# Patient Record
Sex: Female | Born: 1960 | Race: White | Hispanic: No | Smoking: Never smoker
Health system: Southern US, Community
[De-identification: ages and names within clinical notes are randomized; demographics above are authoritative.]

## PROBLEM LIST (undated history)

## (undated) DIAGNOSIS — K851 Biliary acute pancreatitis without necrosis or infection: Secondary | ICD-10-CM

## (undated) DIAGNOSIS — E039 Hypothyroidism, unspecified: Secondary | ICD-10-CM

## (undated) DIAGNOSIS — N2889 Other specified disorders of kidney and ureter: Secondary | ICD-10-CM

## (undated) DIAGNOSIS — C801 Malignant (primary) neoplasm, unspecified: Secondary | ICD-10-CM

## (undated) DIAGNOSIS — K589 Irritable bowel syndrome without diarrhea: Secondary | ICD-10-CM

## (undated) DIAGNOSIS — E785 Hyperlipidemia, unspecified: Secondary | ICD-10-CM

## (undated) DIAGNOSIS — M1990 Unspecified osteoarthritis, unspecified site: Secondary | ICD-10-CM

## (undated) HISTORY — PX: TUBAL LIGATION: SHX77

## (undated) HISTORY — DX: Hyperlipidemia, unspecified: E78.5

## (undated) HISTORY — DX: Irritable bowel syndrome, unspecified: K58.9

---

## 1997-11-11 ENCOUNTER — Ambulatory Visit (HOSPITAL_COMMUNITY): Admission: RE | Admit: 1997-11-11 | Discharge: 1997-11-11 | Payer: Self-pay | Admitting: Obstetrics and Gynecology

## 1998-01-12 ENCOUNTER — Inpatient Hospital Stay (HOSPITAL_COMMUNITY): Admission: AD | Admit: 1998-01-12 | Discharge: 1998-01-15 | Payer: Self-pay | Admitting: Obstetrics and Gynecology

## 1998-02-22 ENCOUNTER — Other Ambulatory Visit: Admission: RE | Admit: 1998-02-22 | Discharge: 1998-02-22 | Payer: Self-pay | Admitting: Obstetrics and Gynecology

## 2003-06-22 ENCOUNTER — Other Ambulatory Visit: Admission: RE | Admit: 2003-06-22 | Discharge: 2003-06-22 | Payer: Self-pay | Admitting: Obstetrics and Gynecology

## 2003-10-05 ENCOUNTER — Ambulatory Visit (HOSPITAL_COMMUNITY): Admission: RE | Admit: 2003-10-05 | Discharge: 2003-10-05 | Payer: Self-pay | Admitting: Obstetrics and Gynecology

## 2004-10-06 ENCOUNTER — Ambulatory Visit (HOSPITAL_COMMUNITY): Admission: RE | Admit: 2004-10-06 | Discharge: 2004-10-06 | Payer: Self-pay | Admitting: Internal Medicine

## 2005-01-05 ENCOUNTER — Other Ambulatory Visit: Admission: RE | Admit: 2005-01-05 | Discharge: 2005-01-05 | Payer: Self-pay | Admitting: Obstetrics and Gynecology

## 2005-08-06 ENCOUNTER — Emergency Department (HOSPITAL_COMMUNITY): Admission: EM | Admit: 2005-08-06 | Discharge: 2005-08-06 | Payer: Self-pay | Admitting: Emergency Medicine

## 2005-11-01 ENCOUNTER — Ambulatory Visit (HOSPITAL_COMMUNITY): Admission: RE | Admit: 2005-11-01 | Discharge: 2005-11-01 | Payer: Self-pay | Admitting: Obstetrics and Gynecology

## 2006-11-05 ENCOUNTER — Ambulatory Visit (HOSPITAL_COMMUNITY): Admission: RE | Admit: 2006-11-05 | Discharge: 2006-11-05 | Payer: Self-pay | Admitting: Internal Medicine

## 2007-11-06 ENCOUNTER — Ambulatory Visit (HOSPITAL_COMMUNITY): Admission: RE | Admit: 2007-11-06 | Discharge: 2007-11-06 | Payer: Self-pay | Admitting: Internal Medicine

## 2008-11-06 ENCOUNTER — Ambulatory Visit (HOSPITAL_COMMUNITY): Admission: RE | Admit: 2008-11-06 | Discharge: 2008-11-06 | Payer: Self-pay | Admitting: Internal Medicine

## 2008-11-06 IMAGING — MG MM DIGITAL SCREENING
4 series · 4 of 4 positions shown · non-contrast
Comparison: none

DG SCREEN MAMMOGRAM BILATERAL
Bilateral CC and MLO view(s) were taken.

DIGITAL SCREENING MAMMOGRAM WITH CAD:
There are scattered fibroglandular densities.  No masses or malignant type calcifications are 
identified.  Compared with prior studies.

[L CC]
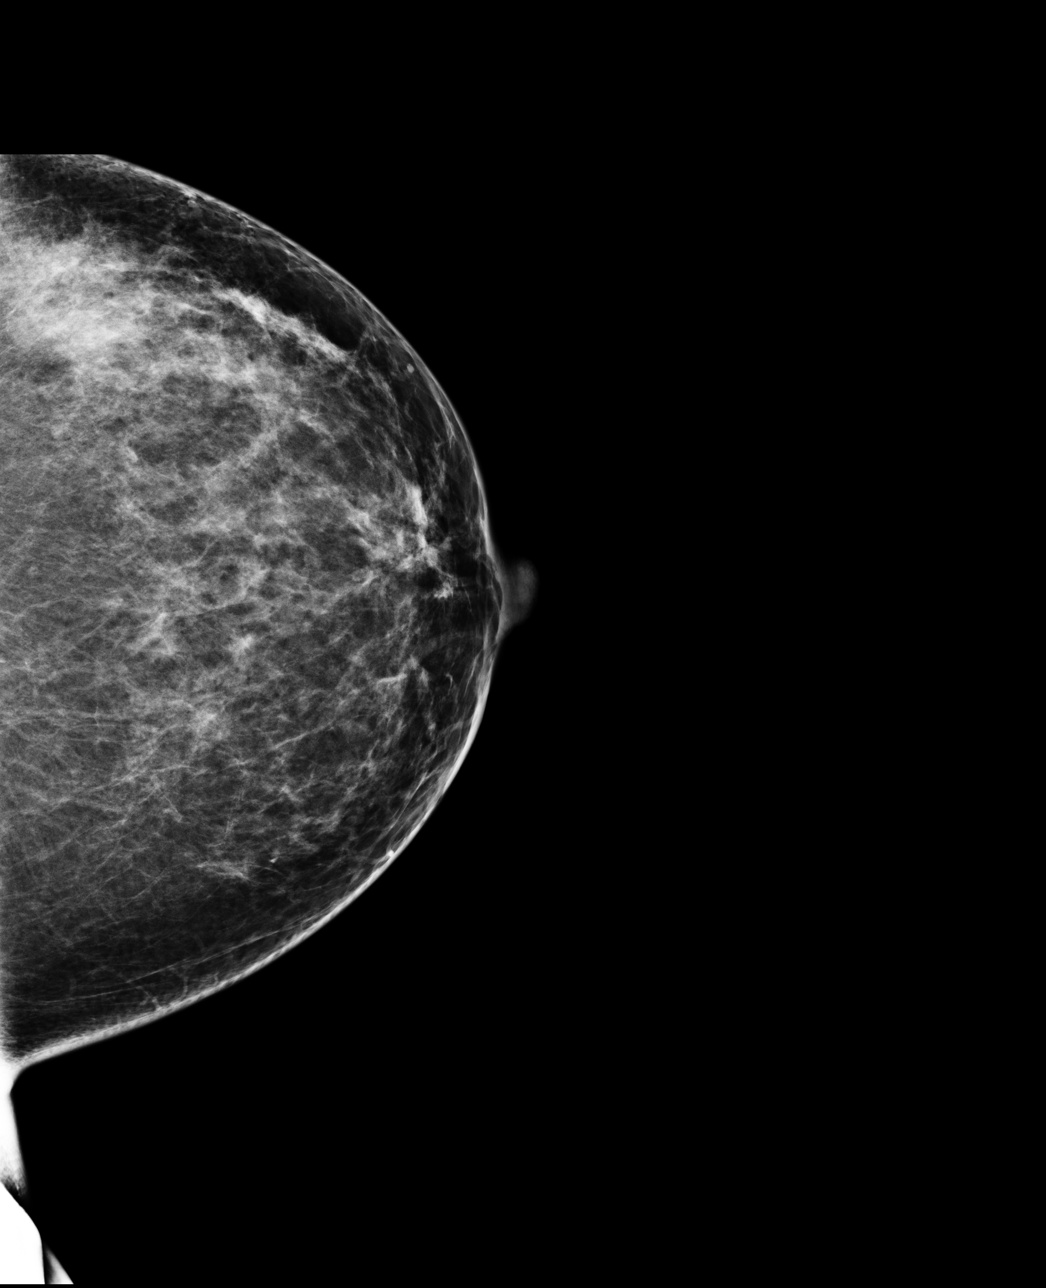

[L MLO]
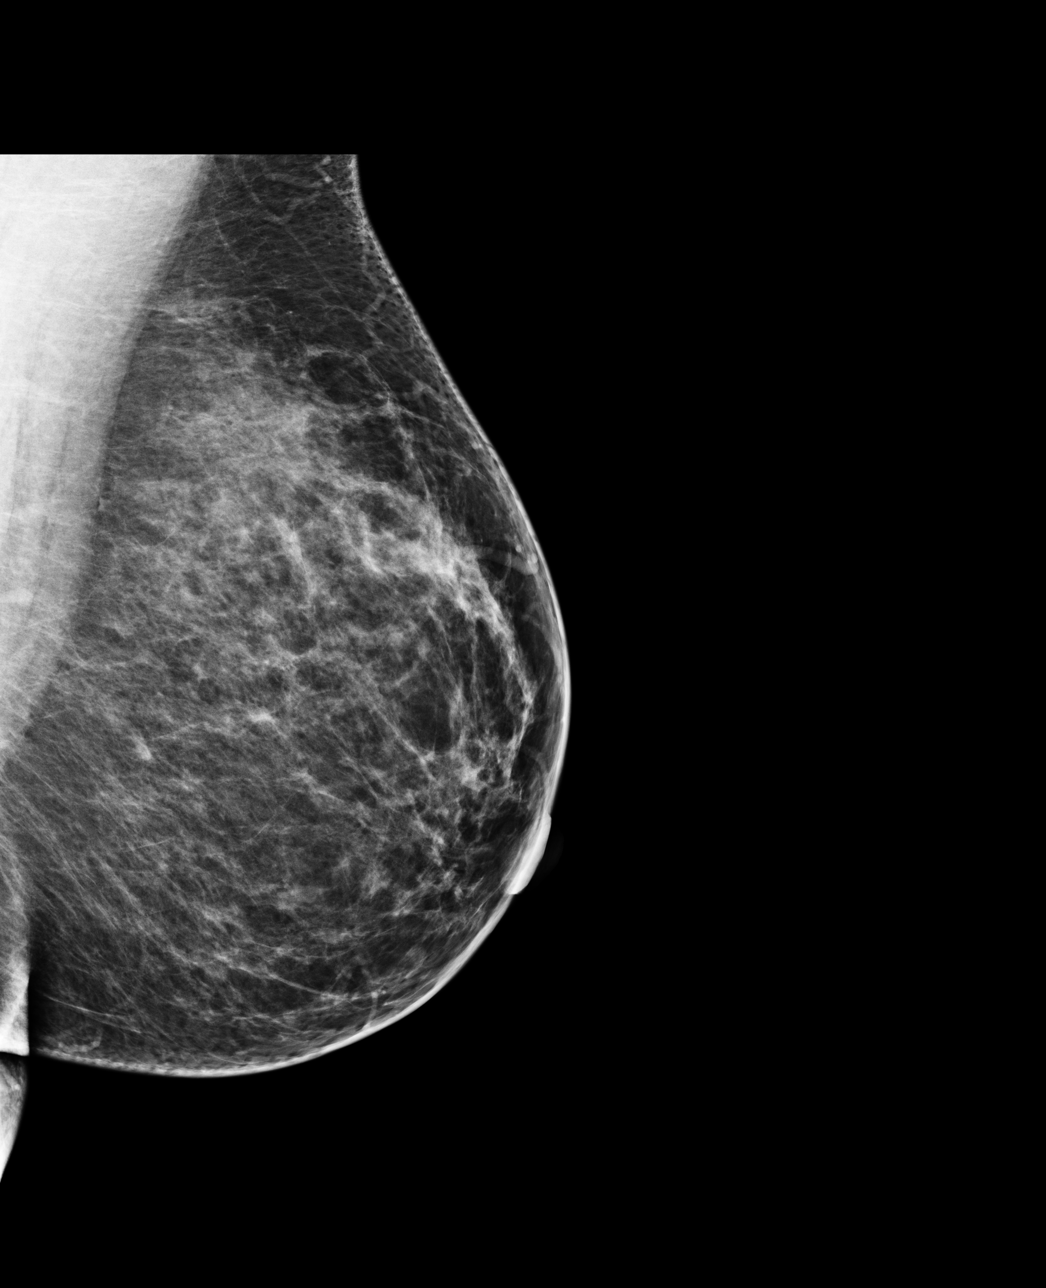

[R CC]
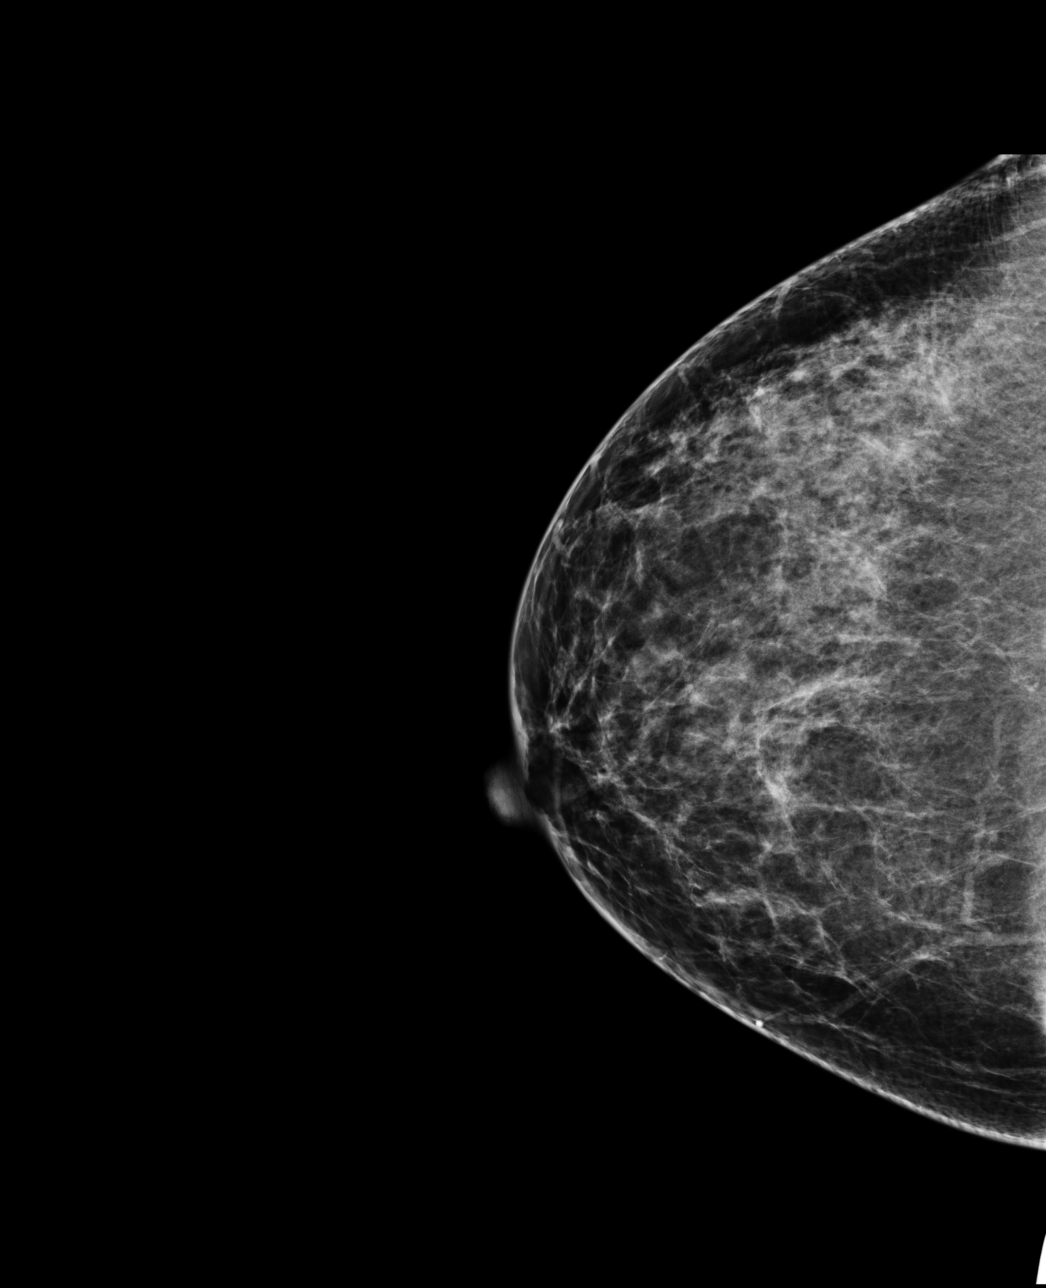

[R MLO]
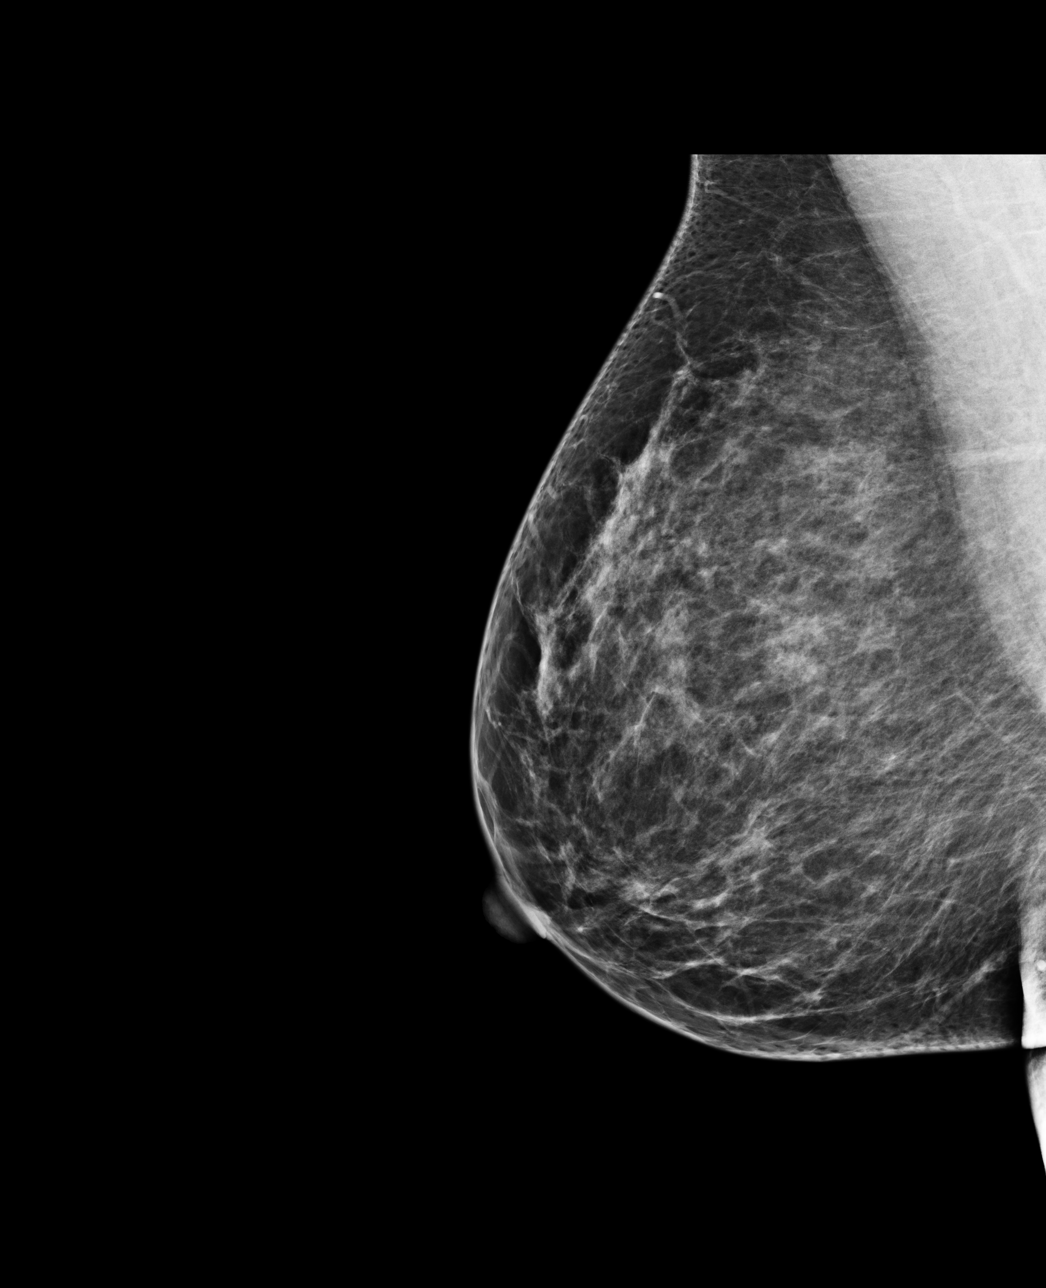

[4 of 4 positions shown; findings below may reference images not displayed]

IMPRESSION: No specific mammographic evidence of malignancy.  Next screening mammogram is recommended in one 
year.

A result letter of this screening mammogram will be mailed directly to the patient.

ASSESSMENT: Negative - BI-RADS 1

Screening mammogram in 1 year.
THIS WAS ANALAYZED BY COMPUTER AIDED DETECTION. , THIS PROCEDURE WAS A DIGITAL MAMMOGRAM.

## 2009-11-09 ENCOUNTER — Ambulatory Visit (HOSPITAL_COMMUNITY): Admission: RE | Admit: 2009-11-09 | Discharge: 2009-11-09 | Payer: Self-pay | Admitting: Internal Medicine

## 2010-10-03 ENCOUNTER — Other Ambulatory Visit (HOSPITAL_COMMUNITY): Payer: Self-pay | Admitting: Internal Medicine

## 2010-10-03 DIAGNOSIS — Z139 Encounter for screening, unspecified: Secondary | ICD-10-CM

## 2010-11-14 ENCOUNTER — Ambulatory Visit (HOSPITAL_COMMUNITY)
Admission: RE | Admit: 2010-11-14 | Discharge: 2010-11-14 | Disposition: A | Discharge status: Home / Self Care | Payer: BC Managed Care – PPO | Source: Ambulatory Visit | Attending: Internal Medicine | Admitting: Internal Medicine

## 2010-11-14 DIAGNOSIS — Z1231 Encounter for screening mammogram for malignant neoplasm of breast: Secondary | ICD-10-CM | POA: Insufficient documentation

## 2010-11-14 DIAGNOSIS — Z139 Encounter for screening, unspecified: Secondary | ICD-10-CM

## 2011-07-25 ENCOUNTER — Ambulatory Visit: Payer: BC Managed Care – PPO | Admitting: Urgent Care

## 2011-07-31 ENCOUNTER — Ambulatory Visit: Payer: BC Managed Care – PPO | Admitting: Gastroenterology

## 2011-08-01 ENCOUNTER — Ambulatory Visit (INDEPENDENT_AMBULATORY_CARE_PROVIDER_SITE_OTHER): Payer: BC Managed Care – PPO | Admitting: Gastroenterology

## 2011-08-01 ENCOUNTER — Encounter: Payer: Self-pay | Admitting: Gastroenterology

## 2011-08-01 ENCOUNTER — Telehealth: Payer: Self-pay | Admitting: Gastroenterology

## 2011-08-01 VITALS — BP 115/78 | HR 75 | Temp 98.1°F | Ht 67.0 in | Wt 226.0 lb

## 2011-08-01 DIAGNOSIS — R195 Other fecal abnormalities: Secondary | ICD-10-CM | POA: Insufficient documentation

## 2011-08-01 DIAGNOSIS — K649 Unspecified hemorrhoids: Secondary | ICD-10-CM | POA: Insufficient documentation

## 2011-08-01 DIAGNOSIS — K625 Hemorrhage of anus and rectum: Secondary | ICD-10-CM

## 2011-08-01 LAB — POC HEMOCCULT BLD/STL (OFFICE/1-CARD/DIAGNOSTIC): Fecal Occult Blood, POC: NEGATIVE

## 2011-08-01 MED ORDER — LIDOCAINE-HYDROCORTISONE ACE 3-0.5 % RE CREA: 1.0000 | TOPICAL_CREAM | Freq: Two times a day (BID) | RECTAL | Status: DC

## 2011-08-01 MED ORDER — SOD PICOSULFATE-MAG OX-CIT ACD 10-3.5-12 MG-GM-GM PO PACK: 1.0000 | PACK | Freq: Once | ORAL | Status: DC

## 2011-08-01 NOTE — Progress Notes (Signed)
Primary Care Physician:  [name], MD  Primary Gastroenterologist:  Roetta Sessions, MD   Chief Complaint  Patient presents with  . Rectal Bleeding    not now    HPI:  Dana Hanson is a 51 y.o. female here for further evaluation of rectal bleeding and hemorrhoids.   Three weeks ago hemorrhoid flared-up. Developed bad rectal pain, difficult to sit. Tried various OTC hemorrhoidal agents including prep-H, Tucks. After about one week, felt like something burst and developed profuse rectal bleeding. Had to wear pads for four days, completely saturated them every few hours. Now no further bleeding. ?new hemorrhoids mildly tender.   Chronically has two large hemorrhoid tags that hang out that she wants taken care of.  Denies constipation, hard stools or strain. Denies heavy lifting. Still with little pain in rectum after BM. Sometimes heartburn with eating late, or fried/spicey foods. Takes otc antacid. No dysphagia. No prior colonoscopy.   Current Outpatient Prescriptions  Medication Sig Dispense Refill  . fenofibrate 160 MG tablet Take 160 mg by mouth daily.       Marland Kitchen lidocaine-hydrocortisone (ANAMANTEL HC) 3-0.5 % CREA Place 1 Applicatorful rectally 2 (two) times daily.  28 g  0  . Sod Picosulfate-Mag Ox-Cit Acd (PREPOPIK) 10-3.5-12 MG-GM-GM PACK Take 1 kit by mouth once.  1 each  0    Allergies as of 08/01/2011  . (No Known Allergies)    Past Medical History  Diagnosis Date  . Hyperlipidemia     Past Surgical History  Procedure Date  . Cesarean section     x 2    Family History  Problem Relation Age of Onset  . Colon cancer Neg Hx   . Liver disease Neg Hx     History   Social History  . Marital Status: Married    Spouse Name: N/A    Number of Children: 2  . Years of Education: N/A   Occupational History  .  [employer]   Social History Main Topics  . Smoking status: Never Smoker   . Smokeless tobacco: Not on file  . Alcohol Use: No  . Drug Use: No  . Sexually  Active: Not on file   Other Topics Concern  . Not on file   Social History Narrative  . No narrative on file      ROS:  General: Negative for anorexia, weight loss, fever, chills, fatigue, weakness. Eyes: Negative for vision changes.  ENT: Negative for hoarseness, difficulty swallowing , nasal congestion. CV: Negative for chest pain, angina, palpitations, dyspnea on exertion, peripheral edema.  Respiratory: Negative for dyspnea at rest, dyspnea on exertion, cough, sputum, wheezing.  GI: See history of present illness. GU:  Negative for dysuria, hematuria, urinary incontinence, urinary frequency, nocturnal urination.  MS: Negative for joint pain, low back pain.  Derm: Negative for rash or itching.  Neuro: Negative for weakness, abnormal sensation, seizure, frequent headaches, memory loss, confusion.  Psych: Negative for anxiety, depression, suicidal ideation, hallucinations.  Endo: Negative for unusual weight change.  Heme: Negative for bruising or bleeding. Allergy: Negative for rash or hives.    Physical Examination:  BP 115/78  Pulse 75  Temp(Src) 98.1 F (36.7 C) (Temporal)  Ht 5\' 7"  (1.702 m)  Wt 226 lb (102.513 kg)  BMI 35.40 kg/m2  LMP 07/24/2011   General: Well-nourished, well-developed in no acute distress.  Head: Normocephalic, atraumatic.   Eyes: Conjunctiva pink, no icterus. Mouth: Oropharyngeal mucosa moist and pink , no lesions erythema or exudate. Neck:  Supple without thyromegaly, masses, or lymphadenopathy.  Lungs: Clear to auscultation bilaterally.  Heart: Regular rate and rhythm, no murmurs rubs or gallops.  Abdomen: Bowel sounds are normal, nontender, nondistended, no hepatosplenomegaly or masses, no abdominal bruits or    hernia , no rebound or guarding.   Rectal: Multiple large hemorrhoidal tags. One hemorrhoid with area of excoriation. Nonthrombosed. Otherwise normal DRE. Stool was heme positive but ?contamination from hemorrhoid. Extremities: No  lower extremity edema. No clubbing or deformities.  Neuro: Alert and oriented x 4 , grossly normal neurologically.  Skin: Warm and dry, no rash or jaundice.   Psych: Alert and cooperative, normal mood and affect.

## 2011-08-01 NOTE — Progress Notes (Signed)
Faxed to PCP

## 2011-08-01 NOTE — Patient Instructions (Addendum)
We have scheduled you for a colonoscopy. Please see separate instructions.  RX for hemorrhoids sent to [facility].  Hemorrhoids Hemorrhoids are veins in the rectum that get big. These veins can get blocked. Blocked veins become puffy (swollen) and painful. HOME CARE  Eat more fiber.   Drink enough fluid to keep your pee (urine) clear or pale yellow.   Exercise often.   Avoid straining to poop (bowel movement).   Keep the butt area dry and clean.   Only take medicine as told by your doctor.  If your hemorrhoids are puffy and painful:  Take a warm bath for 20 to 30 minutes. Do this 3 to 4 times a day.   Place ice packs on the area. Use the ice packs between the baths.   Put ice in a plastic bag.   Place a towel between your skin and the bag.   Leave the ice on for 15 to 20 minutes, 3 to 4 times a day.   Do not use a donut-shaped pillow. Do not sit on the toilet for a long time.   Go to the bathroom when your body has the urge to poop. This is so you do not strain as much to poop.  GET HELP RIGHT AWAY IF:   You have increasing pain that is not controlled with medicine.   You have uncontrolled bleeding.   You cannot poop.   You have pain or puffiness outside the area of the hemorrhoids.   You have chills.   You have a temperature by mouth above 102 F (38.9 C), not controlled by medicine.  MAKE SURE YOU:   Understand these instructions.   Will watch your condition.   Will get help right away if you are not doing well or get worse.  Document Released: 04/11/2008 Document Revised: 03/15/2011 Document Reviewed: 04/11/2008 [facility] Patient Information 2012 [place], [place].

## 2011-08-01 NOTE — Assessment & Plan Note (Signed)
Recent moderate volume hematochezia likely secondary to hemorrhoid. She has significant hemorrhoidal disease and I suspect recently had thrombosed hemorrhoid based on description. Heme positive on exam today but likely contaminated by hemorrhoid with excoriation.  Recommend colonoscopy for further evaluation.  I have discussed the risks, alternatives, benefits with regards to but not limited to the risk of reaction to medication, bleeding, infection, perforation and the patient is agreeable to proceed. Written consent to be obtained.

## 2011-08-01 NOTE — Assessment & Plan Note (Signed)
[facility] pr bid. TCS as planned. She may be interested in surgery for her hemorrhoids. Await TCS and further recommendations to follow as per Dr. Jena Gauss.

## 2011-08-01 NOTE — Telephone Encounter (Signed)
Patient is calling to tell us the medication that Verlon Au called in for her today is $88 and she cant afford it and wondered if we could call in something else to [facility] in [place]

## 2011-08-02 MED ORDER — HYDROCORTISONE 2.5 % RE CREA: TOPICAL_CREAM | Freq: Two times a day (BID) | RECTAL | Status: DC

## 2011-08-02 NOTE — Telephone Encounter (Signed)
New RX sent, if still too expensive, we will keep trying.

## 2011-08-02 NOTE — Telephone Encounter (Signed)
Tried to call pt- left detailed message on pts voicemail.

## 2011-08-08 ENCOUNTER — Encounter: Payer: Self-pay | Admitting: Gastroenterology

## 2011-08-08 ENCOUNTER — Telehealth: Payer: Self-pay | Admitting: Gastroenterology

## 2011-08-08 NOTE — Telephone Encounter (Signed)
Pt called in asking for a cheaper prep- Rx for Trilyte and new instructions faxed to [facility] ( [place])

## 2011-08-11 ENCOUNTER — Encounter (HOSPITAL_COMMUNITY): Payer: Self-pay | Admitting: Pharmacy Technician

## 2011-08-11 MED ORDER — SODIUM CHLORIDE 0.45 % IV SOLN
Freq: Once | INTRAVENOUS | Status: AC
  Administered 2011-08-14: 10:00:00 via INTRAVENOUS

## 2011-08-14 ENCOUNTER — Encounter (HOSPITAL_COMMUNITY)
Admission: RE | Disposition: A | Discharge status: Home / Self Care | Payer: Self-pay | Source: Ambulatory Visit | Attending: Internal Medicine

## 2011-08-14 ENCOUNTER — Other Ambulatory Visit: Payer: Self-pay | Admitting: Internal Medicine

## 2011-08-14 ENCOUNTER — Ambulatory Visit (HOSPITAL_COMMUNITY)
Admission: RE | Admit: 2011-08-14 | Discharge: 2011-08-14 | Disposition: A | Discharge status: Home / Self Care | Payer: BC Managed Care – PPO | Source: Ambulatory Visit | Attending: Internal Medicine | Admitting: Internal Medicine

## 2011-08-14 ENCOUNTER — Encounter (HOSPITAL_COMMUNITY): Payer: Self-pay

## 2011-08-14 DIAGNOSIS — K921 Melena: Secondary | ICD-10-CM

## 2011-08-14 DIAGNOSIS — D126 Benign neoplasm of colon, unspecified: Secondary | ICD-10-CM | POA: Insufficient documentation

## 2011-08-14 DIAGNOSIS — K5730 Diverticulosis of large intestine without perforation or abscess without bleeding: Secondary | ICD-10-CM

## 2011-08-14 DIAGNOSIS — E785 Hyperlipidemia, unspecified: Secondary | ICD-10-CM | POA: Insufficient documentation

## 2011-08-14 DIAGNOSIS — K644 Residual hemorrhoidal skin tags: Secondary | ICD-10-CM

## 2011-08-14 DIAGNOSIS — K625 Hemorrhage of anus and rectum: Secondary | ICD-10-CM

## 2011-08-14 HISTORY — PX: COLONOSCOPY: SHX5424

## 2011-08-14 SURGERY — COLONOSCOPY
Anesthesia: Moderate Sedation

## 2011-08-14 MED ORDER — MEPERIDINE HCL 100 MG/ML IJ SOLN
INTRAMUSCULAR | Status: AC
  Filled 2011-08-14: qty 2

## 2011-08-14 MED ORDER — MIDAZOLAM HCL 5 MG/5ML IJ SOLN
INTRAMUSCULAR | Status: DC | PRN
  Administered 2011-08-14 (×3): 2 mg via INTRAVENOUS
  Administered 2011-08-14: 1 mg via INTRAVENOUS

## 2011-08-14 MED ORDER — MEPERIDINE HCL 100 MG/ML IJ SOLN
INTRAMUSCULAR | Status: DC | PRN
  Administered 2011-08-14 (×2): 50 mg via INTRAVENOUS
  Administered 2011-08-14: 25 mg via INTRAVENOUS

## 2011-08-14 MED ORDER — STERILE WATER FOR IRRIGATION IR SOLN
Status: DC | PRN
  Administered 2011-08-14: 10:00:00

## 2011-08-14 MED ORDER — MIDAZOLAM HCL 5 MG/5ML IJ SOLN
INTRAMUSCULAR | Status: AC
  Filled 2011-08-14: qty 10

## 2011-08-14 NOTE — Op Note (Signed)
[facility] [address] [place], [place]  [address]  COLONOSCOPY PROCEDURE REPORT  PATIENT:  Dana Hanson, Dana Hanson  MR#:  [id number] BIRTHDATE:  1960/07/18, 50 yrs. old  GENDER:  female ENDOSCOPIST:  R. Roetta Sessions, MD FACP [facility] REF. BY:  [name], M.D. PROCEDURE DATE:  08/14/2011 PROCEDURE:  Ileo- colonoscopy with snare polypectomies  INDICATIONS:  Hematochezia  INFORMED CONSENT:  The risks, benefits, alternatives and imponderables including but not limited to bleeding, perforation as well as the possibility of a missed lesion have been reviewed. The potential for biopsy, lesion removal, etc. have also been discussed.  Questions have been answered.  All parties agreeable. Please see the history and physical in the medical record for more information.  MEDICATIONS:  Versed 7 mg IV and Demerol 125 mg IV in divided doses.  DESCRIPTION OF PROCEDURE:  After a digital rectal exam was performed, the EC-[id number] ([id number]) colonoscope was advanced from the anus through the rectum and colon to the area of the cecum, ileocecal valve and appendiceal orifice.  The cecum was deeply intubated.  These structures were well-seen and photographed for the record.  From the level of the cecum and ileocecal valve, the scope was slowly and cautiously withdrawn.  The mucosal surfaces were carefully surveyed utilizing scope tip deflection to facilitate fold flattening as needed.  The scope was pulled down into the rectum where a thorough examination including retroflexion was performed. <<PROCEDUREIMAGES>>  FINDINGS: Multiple external hemorrhoid tags; At least 3;   2 were relatively large; the remainder of the rectum appeared normal. Multiple flat polyps in the  base of the cecum. They were all small-no more than 5 mm in dimensions. The patient had a single 4 mm sigmoid polyp. The patient                     also had scattered sigmoid diverticula; the remainder of the colonic  mucosa appeared normal.  THERAPEUTIC / DIAGNOSTIC MANEUVERS PERFORMED:  The polyps were either hot or cold snare removed.  COMPLICATIONS:  None  CECAL WITHDRAWAL TIME: 11 minutes  IMPRESSION:   Significant external hemorrhoidal tags -  likely source of hematochezia. Normal rectum. Colonic polyps-removed as described                   above. Colonic diverticulosis.  RECOMMENDATIONS:   Followup on pathology. The patient will likely best benefit from definitive surgical intervention in the management of her external hemorrhoidal tags in the near future.  ______________________________ R. Roetta Sessions, MD Caleen Essex  CC:  [name], M.D.  n. eSIGNED:   R. Roetta Sessions at 08/14/2011 10:53 AM  Raymondo Band, [phone]

## 2011-08-14 NOTE — H&P (Signed)
  I have seen & examined the patient prior to the procedure(s) today and reviewed the history and physical/consultation.  There have been no changes.  After consideration of the risks, benefits, alternatives and imponderables, the patient has consented to the procedure(s).   

## 2011-08-18 ENCOUNTER — Encounter: Payer: Self-pay | Admitting: Internal Medicine

## 2011-08-22 ENCOUNTER — Encounter (HOSPITAL_COMMUNITY): Payer: Self-pay | Admitting: Internal Medicine

## 2011-10-04 ENCOUNTER — Encounter (HOSPITAL_COMMUNITY): Payer: BC Managed Care – PPO

## 2011-10-13 ENCOUNTER — Other Ambulatory Visit (HOSPITAL_COMMUNITY): Payer: Self-pay | Admitting: Internal Medicine

## 2011-10-13 DIAGNOSIS — Z139 Encounter for screening, unspecified: Secondary | ICD-10-CM

## 2011-10-17 ENCOUNTER — Encounter (HOSPITAL_COMMUNITY): Payer: BC Managed Care – PPO

## 2011-10-23 ENCOUNTER — Ambulatory Visit (HOSPITAL_COMMUNITY): Admission: RE | Admit: 2011-10-23 | Payer: BC Managed Care – PPO | Source: Ambulatory Visit | Admitting: General Surgery

## 2011-10-23 ENCOUNTER — Encounter (HOSPITAL_COMMUNITY): Admission: RE | Payer: Self-pay | Source: Ambulatory Visit

## 2011-10-23 SURGERY — HEMORRHOIDECTOMY PROLAPSED
Anesthesia: General

## 2011-11-02 NOTE — Patient Instructions (Addendum)
   Your procedure is scheduled on:    Report to [facility] at 6:15     AM.  Call this number if you have problems the morning of surgery: [phone]   Remember:   Do not drink or eat food:After Midnight.    Clear liquids include soda, tea, black coffee, apple or grape juice, broth.  Take these medicines the morning of surgery with A SIP OF WATER: none   Do not wear jewelry, make-up or nail polish.  Do not wear lotions, powders, or perfumes. You may wear deodorant.  Do not shave 48 hours prior to surgery.  Do not bring valuables to the hospital.  Contacts, dentures or bridgework may not be worn into surgery.  Leave suitcase in the car. After surgery it may be brought to your room.  For patients admitted to the hospital, checkout time is 11:00 AM the day of discharge.   Patients discharged the day of surgery will not be allowed to drive home.  Name and phone number of your driver:   Special Instructions: CHG Shower Shower 2 days before surgery and 1 day before surgery with Hibiclens.   Please read over the following fact sheets that you were given: Pain Booklet, Surgical Site Infection Prevention, Anesthesia Post-op Instructions and Care and Recovery After Surgery

## 2011-11-03 ENCOUNTER — Encounter (HOSPITAL_COMMUNITY): Admission: RE | Admit: 2011-11-03 | Discharge: 2011-11-03 | Payer: BC Managed Care – PPO | Source: Ambulatory Visit

## 2011-11-03 ENCOUNTER — Encounter (HOSPITAL_COMMUNITY): Payer: Self-pay | Admitting: Pharmacy Technician

## 2011-11-06 ENCOUNTER — Encounter (HOSPITAL_COMMUNITY)
Admission: RE | Admit: 2011-11-06 | Discharge: 2011-11-06 | Disposition: A | Discharge status: Home / Self Care | Payer: BC Managed Care – PPO | Source: Ambulatory Visit | Attending: General Surgery | Admitting: General Surgery

## 2011-11-06 ENCOUNTER — Encounter (HOSPITAL_COMMUNITY): Payer: Self-pay

## 2011-11-06 LAB — DIFFERENTIAL
Basophils Relative: 0 % (ref 0–1)
Lymphs Abs: 2.5 10*3/uL (ref 0.7–4.0)
Monocytes Absolute: 0.4 10*3/uL (ref 0.1–1.0)
Monocytes Relative: 5 % (ref 3–12)
Neutro Abs: 5.3 10*3/uL (ref 1.7–7.7)
Neutrophils Relative %: 62 % (ref 43–77)

## 2011-11-06 LAB — CBC
HCT: 37.7 % (ref 36.0–46.0)
Hemoglobin: 12.2 g/dL (ref 12.0–15.0)
MCH: 27.6 pg (ref 26.0–34.0)
MCHC: 32.4 g/dL (ref 30.0–36.0)
RBC: 4.42 MIL/uL (ref 3.87–5.11)

## 2011-11-06 LAB — BASIC METABOLIC PANEL
BUN: 12 mg/dL (ref 6–23)
Chloride: 101 mEq/L (ref 96–112)
GFR calc Af Amer: >90 mL/min (ref >90)
Glucose, Bld: 95 mg/dL (ref 70–99)
Potassium: 4.1 mEq/L (ref 3.5–5.1)
Sodium: 138 mEq/L (ref 135–145)

## 2011-11-06 NOTE — Patient Instructions (Addendum)
20 Dana Hanson  11/06/2011   Your procedure is scheduled on:   11/10/2011  Report to [facility] at  615  AM.  Call this number if you have problems the morning of surgery: [phone]   Remember:   Do not eat food:After Midnight.  May have clear liquids:until Midnight .  Clear liquids include soda, tea, black coffee, apple or grape juice, broth.  Take these medicines the morning of surgery with A SIP OF WATER:  none   Do not wear jewelry, make-up or nail polish.  Do not wear lotions, powders, or perfumes. You may wear deodorant.  Do not shave 48 hours prior to surgery.  Do not bring valuables to the hospital.  Contacts, dentures or bridgework may not be worn into surgery.  Leave suitcase in the car. After surgery it may be brought to your room.  For patients admitted to the hospital, checkout time is 11:00 AM the day of discharge.   Patients discharged the day of surgery will not be allowed to drive home.  Name and phone number of your driver: family  Special Instructions: CHG Shower Use Special Wash: 1/2 bottle night before surgery and 1/2 bottle morning of surgery.   Please read over the following fact sheets that you were given: Pain Booklet, MRSA Information, Surgical Site Infection Prevention, Anesthesia Post-op Instructions and Care and Recovery After Surgery Hemorrhoidectomy Hemorrhoidectomy is surgery to remove hemorrhoids. Hemorrhoids are veins that have become swollen in the rectum. The rectum is the area from the bottom end of the intestines to the opening where bowel movements leave the body. Hemorrhoids can be uncomfortable. They can cause itching, bleeding and pain if a blood clot forms in them (thrombose). If hemorrhoids are small, surgery may not be needed. But if they cover a larger area, surgery is usually suggested.  LET YOUR CAREGIVER KNOW ABOUT:   Any allergies.   All medications you are taking, including:   Herbs, eyedrops, over-the-counter medications and  creams.   Blood thinners (anticoagulants), aspirin or other drugs that could affect blood clotting.   Use of steroids (by mouth or as creams).   Previous problems with anesthetics, including local anesthetics.   Possibility of pregnancy, if this applies.   Any history of blood clots.   Any history of bleeding or other blood problems.   Previous surgery.   Smoking history.   Other health problems.  RISKS AND COMPLICATIONS All surgery carries some risk. However, hemorrhoid surgery usually goes smoothly. Possible complications could include:  Urinary retention.   Bleeding.   Infection.   A painful incision.   A reaction to the anesthesia (this is not common).  BEFORE THE PROCEDURE   Stop using aspirin and non-steroidal anti-inflammatory drugs (NSAIDs) for pain relief. This includes prescription drugs and over-the-counter drugs such as ibuprofen and naproxen. Also stop taking vitamin E. If possible, do this two weeks before your surgery.   If you take blood-thinners, ask your healthcare provider when you should stop taking them.   You will probably have blood and urine tests done several days before your surgery.   Do not eat or drink for about 8 hours before the surgery.   Arrive at least an hour before the surgery, or whenever your surgeon recommends. This will give you time to check in and fill out any needed paperwork.   Hemorrhoidectomy is often an outpatient procedure. This means you will be able to go home the same day. Sometimes, though, people stay overnight  in the hospital after the procedure. Ask your surgeon what to expect. Either way, make arrangements in advance for someone to drive you home.  PROCEDURE   The preparation:   You will change into a hospital gown.   You will be given an IV. A needle will be inserted in your arm. Medication can flow directly into your body through this needle.   You might be given an enema to clear your rectum.   Once in  the operating room, you will probably lie on your side or be repositioned later to lying on your stomach.   You will be given anesthesia (medication) so you will not feel anything during the surgery. The surgery often is done with local anesthesia (the area near the hemorrhoids will be numb and you will be drowsy but awake). Sometimes, general anesthesia is used (you will be asleep during the procedure).   The procedure:   There are a few different procedures for hemorrhoids. Be sure to ask you surgeon about the procedure, the risks and benefits.   Be sure to ask about what you need to do to take care of the wound, if there is one.  AFTER THE PROCEDURE  You will stay in a recovery area until the anesthesia has worn off. Your blood pressure and pulse will be checked every so often.   You may feel a lot of pain in the area of the rectum.   Take all pain medication prescribed by your surgeon. Ask before taking any over-the-counter pain medicines.   Sometimes sitting in a warm bath can help relieve your pain.   To make sure you have bowel movements without straining:   You will probably need to take stool softeners (usually a pill) for a few days.   You should drink 8 to 10 glasses of water each day.   Your activity will be restricted for awhile. Ask your caregiver for a list of what you should and should not do while you recover.  Document Released: 04/30/2009 Document Revised: 06/22/2011 Document Reviewed: 04/30/2009 [facility] Patient Information 2012 [place], [place].PATIENT INSTRUCTIONS POST-ANESTHESIA  IMMEDIATELY FOLLOWING SURGERY:  Do not drive or operate machinery for the first twenty four hours after surgery.  Do not make any important decisions for twenty four hours after surgery or while taking narcotic pain medications or sedatives.  If you develop intractable nausea and vomiting or a severe headache please notify your doctor immediately.  FOLLOW-UP:  Please make an appointment  with your surgeon as instructed. You do not need to follow up with anesthesia unless specifically instructed to do so.  WOUND CARE INSTRUCTIONS (if applicable):  Keep a dry clean dressing on the anesthesia/puncture wound site if there is drainage.  Once the wound has quit draining you may leave it open to air.  Generally you should leave the bandage intact for twenty four hours unless there is drainage.  If the epidural site drains for more than 36-48 hours please call the anesthesia department.  QUESTIONS?:  Please feel free to call your physician or the hospital operator if you have any questions, and they will be happy to assist you.     [facility] Anesthesia Department [address] [place] [phone]

## 2011-11-09 NOTE — H&P (Signed)
  NTS SOAP Note  Vital Signs:  Vitals as of: 09/21/2011: Systolic 140: Diastolic 94: Heart Rate 86: Temp 96.62F: Height 67ft 7in: Weight 226Lbs 0 Ounces: OFC 0in: Respiratory Rate 0: O2 Saturation 0: Pain Level 0: BMI 35  BMI : 35.4 kg/m2  Subjective: This 29 Years 63 Months old Female presents for of prolasped hemorrhoids. Patient has had history of thrombosed hemorrhoid.  No new episode.  Has persist prolasped hemorrhoidal tissue.  No ability to reduce.  No bleeding, no pain.  No fever and chills.  No change with prep-H, steroidal cream.  Has not frequently tried sitz's baths but no change in the past.  Last c-scope uneventful.   Review of Symptoms:  Constitutional:unremarkable Head:unremarkable Eyes:unremarkable Nose/Mouth/Throat:unremarkable Cardiovascular:unremarkable Respiratory:unremarkable as per HPI Genitourinary:unremarkable Musculoskeletal:unremarkable Skin:unremarkable Breast:unremarkable Hematolgic/Lymphatic:unremarkable Allergic/Immunologic:unremarkable   Past Medical History:Obtained   Past Medical History  Pregnancy Gravida: 2 Pregnancy Para: 2 Surgical History: c-section Medical Problems: Hypercholestolemia Psychiatric History: none Allergies: NKDA Medications: none   Social History:Obtained   Social History  Preferred Language: English ([place]) Race:  White Ethnicity: Not Hispanic / Latino Age: 51 Years 3 Months Marital Status:  L Alcohol: none Recreational drug(s): none   Smoking Status: Never smoker reviewed on 09/25/2011  Family History:Obtained   Family History  Is there a family history NG:EXBMWU, urinary cancer, DM    Objective Information: General:Well appearing, well nourished in no distress. Skin:no rash or prominent lesions Head:Atraumatic; no masses; no abnormalities Eyes:conjunctiva clear, EOM intact, PERRL Mouth:Mucous membranes moist, no mucosal  lesions. Neck:Supple without lymphadenopathy.  Heart:RRR, no murmur Lungs:CTA bilaterally, no wheezes, rhonchi, rales.  Breathing unlabored. Abdomen:Soft, NT/ND, no HSM, no masses. prominent hemorrhoidal tissue.  Non-reducible.  NO pain.  Assessment:  Diagnosis &amp; Procedure: DiagnosisCode: 455.6, ProcedureCode: 13244,    Plan: Grade 3 hemorrhoidal disease refractory to medical management.  Options discussed.  Patient will schedule at her convenience.  Patient Education:Alternative treatments to surgery were discussed with patient (and family).Risks and benefits  of procedure were fully explained to the patient (and family) who gave informed consent. Patient/family questions were addressed.  Follow-up:Pending Surgery                                     Active Diagnosis and Procedures: 455.6 Unspecified hemorrhoids without mention of complication   99203 - OFFICE OUTPATIENT NEW 30 MINUTES

## 2011-11-10 ENCOUNTER — Encounter (HOSPITAL_COMMUNITY): Payer: Self-pay | Admitting: *Deleted

## 2011-11-10 ENCOUNTER — Ambulatory Visit (HOSPITAL_COMMUNITY): Payer: BC Managed Care – PPO | Admitting: Anesthesiology

## 2011-11-10 ENCOUNTER — Encounter (HOSPITAL_COMMUNITY): Payer: Self-pay | Admitting: Anesthesiology

## 2011-11-10 ENCOUNTER — Encounter (HOSPITAL_COMMUNITY)
Admission: RE | Disposition: A | Discharge status: Home / Self Care | Payer: Self-pay | Source: Ambulatory Visit | Attending: General Surgery

## 2011-11-10 ENCOUNTER — Ambulatory Visit (HOSPITAL_COMMUNITY)
Admission: RE | Admit: 2011-11-10 | Discharge: 2011-11-10 | Disposition: A | Discharge status: Home / Self Care | Payer: BC Managed Care – PPO | Source: Ambulatory Visit | Attending: General Surgery | Admitting: General Surgery

## 2011-11-10 DIAGNOSIS — E7800 Pure hypercholesterolemia, unspecified: Secondary | ICD-10-CM | POA: Insufficient documentation

## 2011-11-10 DIAGNOSIS — K649 Unspecified hemorrhoids: Secondary | ICD-10-CM | POA: Insufficient documentation

## 2011-11-10 HISTORY — PX: HEMORRHOID SURGERY: SHX153

## 2011-11-10 SURGERY — HEMORRHOIDECTOMY
Anesthesia: General | Site: Rectum | Wound class: Dirty or Infected

## 2011-11-10 MED ORDER — MIDAZOLAM HCL 2 MG/2ML IJ SOLN
1.0000 mg | INTRAMUSCULAR | Status: DC | PRN
  Administered 2011-11-10: 2 mg via INTRAVENOUS

## 2011-11-10 MED ORDER — DEXTROSE 5 % IV SOLN
INTRAVENOUS | Status: AC
  Filled 2011-11-10: qty 2

## 2011-11-10 MED ORDER — MIDAZOLAM HCL 2 MG/2ML IJ SOLN
INTRAMUSCULAR | Status: AC
  Filled 2011-11-10: qty 2

## 2011-11-10 MED ORDER — LIDOCAINE VISCOUS 2 % MT SOLN
OROMUCOSAL | Status: AC
  Filled 2011-11-10: qty 15

## 2011-11-10 MED ORDER — FENTANYL CITRATE 0.05 MG/ML IJ SOLN
INTRAMUSCULAR | Status: DC | PRN
  Administered 2011-11-10 (×8): 50 ug via INTRAVENOUS

## 2011-11-10 MED ORDER — FENTANYL CITRATE 0.05 MG/ML IJ SOLN
INTRAMUSCULAR | Status: AC
  Filled 2011-11-10: qty 2

## 2011-11-10 MED ORDER — ONDANSETRON HCL 4 MG/2ML IJ SOLN
INTRAMUSCULAR | Status: AC
  Filled 2011-11-10: qty 2

## 2011-11-10 MED ORDER — FENTANYL CITRATE 0.05 MG/ML IJ SOLN
25.0000 ug | INTRAMUSCULAR | Status: DC | PRN
  Administered 2011-11-10 (×3): 50 ug via INTRAVENOUS

## 2011-11-10 MED ORDER — DEXTROSE 5 % IV SOLN
1.0000 g | INTRAVENOUS | Status: DC | PRN
  Administered 2011-11-10: 1 g via INTRAVENOUS

## 2011-11-10 MED ORDER — PROPOFOL 10 MG/ML IV EMUL
INTRAVENOUS | Status: DC | PRN
  Administered 2011-11-10: 200 mg via INTRAVENOUS

## 2011-11-10 MED ORDER — ONDANSETRON HCL 4 MG/2ML IJ SOLN: 4.0000 mg | Freq: Once | INTRAMUSCULAR | Status: DC | PRN

## 2011-11-10 MED ORDER — BUPIVACAINE HCL (PF) 0.5 % IJ SOLN
INTRAMUSCULAR | Status: AC
  Filled 2011-11-10: qty 30

## 2011-11-10 MED ORDER — GLYCOPYRROLATE 0.2 MG/ML IJ SOLN
INTRAMUSCULAR | Status: AC
  Filled 2011-11-10: qty 1

## 2011-11-10 MED ORDER — CELECOXIB 100 MG PO CAPS
ORAL_CAPSULE | ORAL | Status: AC
  Filled 2011-11-10: qty 4

## 2011-11-10 MED ORDER — LIDOCAINE HCL (PF) 1 % IJ SOLN
INTRAMUSCULAR | Status: AC
  Filled 2011-11-10: qty 5

## 2011-11-10 MED ORDER — PROPOFOL 10 MG/ML IV EMUL
INTRAVENOUS | Status: AC
  Filled 2011-11-10: qty 20

## 2011-11-10 MED ORDER — DOCUSATE SODIUM 100 MG PO CAPS: 100.0000 mg | ORAL_CAPSULE | Freq: Two times a day (BID) | ORAL | Status: AC

## 2011-11-10 MED ORDER — DEXTROSE 5 % IV SOLN
INTRAVENOUS | Status: AC
  Filled 2011-11-10: qty 1

## 2011-11-10 MED ORDER — BUPIVACAINE HCL (PF) 0.5 % IJ SOLN
INTRAMUSCULAR | Status: DC | PRN
  Administered 2011-11-10: 10 mL

## 2011-11-10 MED ORDER — DEXTROSE 5 % IV SOLN: 1.0000 g | INTRAVENOUS | Status: DC

## 2011-11-10 MED ORDER — LACTATED RINGERS IV SOLN
INTRAVENOUS | Status: DC
  Administered 2011-11-10: 1000 mL via INTRAVENOUS

## 2011-11-10 MED ORDER — LIDOCAINE VISCOUS 2 % MT SOLN
OROMUCOSAL | Status: DC | PRN
  Administered 2011-11-10: 1

## 2011-11-10 MED ORDER — HEMOSTATIC AGENTS (NO CHARGE) OPTIME
TOPICAL | Status: DC | PRN
  Administered 2011-11-10: 1 via TOPICAL

## 2011-11-10 MED ORDER — HYDROCODONE-ACETAMINOPHEN 5-325 MG PO TABS: 1.0000 | ORAL_TABLET | ORAL | Status: AC | PRN

## 2011-11-10 MED ORDER — CELECOXIB 100 MG PO CAPS
400.0000 mg | ORAL_CAPSULE | Freq: Every day | ORAL | Status: AC
  Administered 2011-11-10: 400 mg via ORAL

## 2011-11-10 MED ORDER — 0.9 % SODIUM CHLORIDE (POUR BTL) OPTIME
TOPICAL | Status: DC | PRN
  Administered 2011-11-10: 1000 mL

## 2011-11-10 MED ORDER — MIDAZOLAM HCL 5 MG/5ML IJ SOLN
INTRAMUSCULAR | Status: DC | PRN
  Administered 2011-11-10: 2 mg via INTRAVENOUS

## 2011-11-10 MED ORDER — GLYCOPYRROLATE 0.2 MG/ML IJ SOLN
0.2000 mg | Freq: Once | INTRAMUSCULAR | Status: AC
  Administered 2011-11-10: 0.2 mg via INTRAVENOUS

## 2011-11-10 MED ORDER — ONDANSETRON HCL 4 MG/2ML IJ SOLN
4.0000 mg | Freq: Once | INTRAMUSCULAR | Status: AC
  Administered 2011-11-10: 4 mg via INTRAVENOUS

## 2011-11-10 MED ORDER — LACTATED RINGERS IV SOLN
INTRAVENOUS | Status: DC | PRN
  Administered 2011-11-10 (×2): via INTRAVENOUS

## 2011-11-10 MED ORDER — ACETAMINOPHEN 325 MG PO TABS: 325.0000 mg | ORAL_TABLET | ORAL | Status: DC | PRN

## 2011-11-10 SURGICAL SUPPLY — 31 items
BAG HAMPER (MISCELLANEOUS) ×2 IMPLANT
CLOTH BEACON ORANGE TIMEOUT ST (SAFETY) ×2 IMPLANT
COVER SURGICAL LIGHT HANDLE (MISCELLANEOUS) ×4 IMPLANT
DECANTER SPIKE VIAL GLASS SM (MISCELLANEOUS) ×2 IMPLANT
DRAPE PROXIMA HALF (DRAPES) ×2 IMPLANT
DRESSING GELFORM 100 (GAUZE/BANDAGES/DRESSINGS) ×1 IMPLANT
ELECT REM PT RETURN 9FT ADLT (ELECTROSURGICAL) ×2
ELECTRODE REM PT RTRN 9FT ADLT (ELECTROSURGICAL) ×1 IMPLANT
FORMALIN 10 PREFIL 120ML (MISCELLANEOUS) ×2 IMPLANT
GLOVE BIOGEL PI IND STRL 7.0 (GLOVE) IMPLANT
GLOVE BIOGEL PI IND STRL 7.5 (GLOVE) ×1 IMPLANT
GLOVE BIOGEL PI INDICATOR 7.0 (GLOVE) ×2
GLOVE BIOGEL PI INDICATOR 7.5 (GLOVE) ×1
GLOVE ECLIPSE 7.0 STRL STRAW (GLOVE) ×2 IMPLANT
GLOVE EXAM NITRILE MD LF STRL (GLOVE) ×1 IMPLANT
GLOVE OPTIFIT SS 6.5 STRL BRWN (GLOVE) ×1 IMPLANT
GOWN STRL REIN XL XLG (GOWN DISPOSABLE) ×5 IMPLANT
HEMOSTAT SURGICEL 4X8 (HEMOSTASIS) ×2 IMPLANT
KIT ROOM TURNOVER APOR (KITS) ×2 IMPLANT
MANIFOLD NEPTUNE II (INSTRUMENTS) ×2 IMPLANT
NDL HYPO 25X1 1.5 SAFETY (NEEDLE) ×1 IMPLANT
NEEDLE HYPO 25X1 1.5 SAFETY (NEEDLE) ×2 IMPLANT
NS IRRIG 1000ML POUR BTL (IV SOLUTION) ×2 IMPLANT
PACK PERI GYN (CUSTOM PROCEDURE TRAY) ×2 IMPLANT
PAD ARMBOARD 7.5X6 YLW CONV (MISCELLANEOUS) ×2 IMPLANT
SET BASIN LINEN APH (SET/KITS/TRAYS/PACK) ×2 IMPLANT
SPONGE GAUZE 4X4 12PLY (GAUZE/BANDAGES/DRESSINGS) ×2 IMPLANT
SUT CHROMIC 2 0 CT 1 (SUTURE) ×3 IMPLANT
SUT SILK 0 FSL (SUTURE) ×2 IMPLANT
SYR CONTROL 10ML LL (SYRINGE) ×2 IMPLANT
TOWEL OR 17X26 4PK STRL BLUE (TOWEL DISPOSABLE) ×2 IMPLANT

## 2011-11-10 NOTE — Op Note (Signed)
Patient:  Dana Hanson  DOB:  03-12-1961  MRN:  [id number]   Preop Diagnosis:  Prolapsed hemorrhoid  Postop Diagnosis:  The same  Procedure:  Hemorrhoidectomy  Surgeon:  Dr. Tilford Pillar  Anes:  General endotracheal, 0.5% Sensorcaine plain the local  Indications:  Patient is a 51 year old female presented my office with a history of prominent hemorrhoidal tissue. She states is present for several years but increased in size and now persistently stays out. She states this is extremely uncomfortable. On evaluation patient did have to columns of hemorrhoidal tissue which were prominent and were persistently prolapsed. Upon attempted reduction, both hemorrhoids immediately prolapsed. Risks benefits alternatives of hemorrhoidectomy were discussed. Patient's questions and concerns were addressed the patient was consented for planned procedure.  Procedure note:  Patient is taken to the or is placed in supine position on the or table which time the general anesthetic is a Optician, dispensing. Once patient was asleep she was endotracheally intubated by the nurse anesthetist. At this point patient was placed into bilateral yellow thin service into a high lithotomy position. Her perineum and rectum were prepped with Betadine solution and draped in standard fashion. I did insert a large rectal retractor at this time. The anterior hemorrhoid was identified and grasped without clamp. The hemorrhoid was pulled to the field and was excised electrocautery. A 2-0 chromic suture was then utilized to reapproximate the mucosal edges in a running locking fashion thus obtaining hemostasis. Attention was then turned to the right posterior hemorrhoidal column. The hemorrhoid was grasped with an Allis clamp and again elevated into the field. The hemorrhoidal tissue was excised with electrocautery and again a 2-0 chromic suture was utilized in a running locking fashion to reapproximate the mucosa and obtain hemostasis. Both  hemorrhoids were sent as permanent specimens to pathology. I did palpate the anal opening and rectal vault and was easily able to pass 2 fingers beyond the repair. Hemostasis is excellent. At this point a tampon fashioned from a piece of Gelfoam wrapped in Surgicel and secured with a 2-0 silk for removal was advanced into the rectum. Rolled 4 x 4 gauzes were then placed over the anal opening to secure the tampon Amer loosely secured with a Medipore tape. The drapes removed. The skin was washed dried moist dry towel. Mesh underwear was placed. The patient was allowed to come out of general anesthetic was transferred to PACU in stable condition. At the conclusion of procedure all instrument, sponge, needle counts are correct. Patient tolerated procedure extremely well.  Complications:  None apparent  EBL:  Less than 100 ML  Specimen:  Hemorrhoidal tissue

## 2011-11-10 NOTE — Discharge Instructions (Signed)
Hemorrhoidectomy Care After Hemorrhoidectomy is the removal of enlarged (dilated) veins around the rectum. Until the surgical areas are healed, control of pain and avoiding constipation are the greatest challenges for patients.  For as long as 24 hours after receiving an anesthetic (the medication that made you sleep), and while taking narcotic pain relievers, you may feel dizzy, weak and drowsy. For that reason, the following information applies to the first 24-hour period following surgery, and continues for as long as you are taking narcotic pain medications.  Do not drive a car, ride a bicycle, participate in activities in which you could be hurt. Do not take public transportation until you are off narcotic pain medications and until your caregiver says it is okay.   Do not drink alcohol, take tranquilizers, or medications not prescribed or allowed by your surgical caregiver.   Do not sign important papers or contracts for at least 24 hours or while taking narcotic medications.   Have a responsible person with you for 24 hours.  RISKS AND COMPLICATIONS Some problems that may occur following this procedure include:  Infection. A germ starts growing in the tissue surrounding the site operated on. This can usually be treated with antibiotics.   Damage to the rectal sphincter could occur. This is the muscle that opens in your anus to allow a bowel movement. This could cause incontinence. This is uncommon.   Bleeding following surgery can be a complication of almost any surgery. Your surgeon takes every precaution to keep this from happening.   Complications of anesthesia.  HOME CARE INSTRUCTIONS  Avoid straining when having bowel movements.   Avoid heavy lifting (more than 10 pounds (4.5 kilograms)).   Only take over-the-counter or prescription medicines for pain, discomfort, or fever as directed by your caregiver.   Take hot sitz baths for 20 to 30 minutes, 3 to 4 times per day.   To  keep swelling down, apply an ice pack for twenty minutes three to four times per day between sitz baths. Use a towel between your skin and the ice pack. Do not do this if it causes too much discomfort.   Keep anal area clean and dry. Following a bowel movement, you can gently wash the area with tucks (available for purchase at a drugstore) or cotton swabs. Gently pat the area dry. Do not rub the area.   Eat a well balanced diet and drink 6 to 8 glasses of water every day to avoid constipation. A bulk laxative may be also be helpful.  SEEK MEDICAL CARE IF:   You have increasing pain or tenderness near or in the surgical site.   You are unable to eat or drink.   You develop nausea or vomiting.   You develop uncontrolled bleeding such as soaking two to three pads in one hour.   You have constipation, not helped by changing your diet or increasing your fluid intake. Pain medications are a common cause of constipation.   You have pain and redness (inflammation) extending outside the area of your surgery.   You develop an unexplained oral temperature above 102 F (38.9 C), or any other signs of infection.   You have any other questions or concerns following surgery.  Document Released: 09/23/2003 Document Revised: 06/22/2011 Document Reviewed: 12/21/2008 [facility] Patient Information 2012 [place], [place]. Anesthesia Post Note  Patient: Dana Hanson  Procedure(s) Performed: Procedure(s) (LRB): HEMORRHOIDECTOMY (N/A)  Anesthesia type: {PROCEDURES; ANE POST ANESTHESIA GNFA:21308}  Patient location: {PLACES; ANE MVHQ:46962}  Post  pain: {FINDINGS; ANE POST ZOXW:96045}  Post assessment: {ASSESSMENT; ANE WUJW:11914}  Last Vitals:  Filed Vitals:   11/10/11 1000  BP: 166/76  Pulse: 84  Temp:   Resp: 12    Post vital signs: {DESC; ANE POST VITALS:19483}  Level of consciousness: {FINDINGS; ANE POST LEVEL OF CONSCIOUSNESS:19484}  Complications: {FINDINGS; ANE POST  COMPLICATIONS:19485}

## 2011-11-10 NOTE — Anesthesia Postprocedure Evaluation (Signed)
  Anesthesia Post-op Note  Patient: Dana Hanson  Procedure(s) Performed: Procedure(s) (LRB): HEMORRHOIDECTOMY (N/A)  Patient Location: PACU  Anesthesia Type: General  Level of Consciousness: awake, alert , oriented and patient cooperative  Airway and Oxygen Therapy: Patient Spontanous Breathing and Patient connected to face mask oxygen  Post-op Pain: mild  Post-op Assessment: Post-op Vital signs reviewed, Patient's Cardiovascular Status Stable, Respiratory Function Stable and Patent Airway  Post-op Vital Signs: Reviewed and stable  Complications: No apparent anesthesia complications

## 2011-11-10 NOTE — Anesthesia Procedure Notes (Signed)
Procedure Name: LMA Insertion Date/Time: 11/10/2011 8:08 AM Performed by: Carolyne Littles, Hanny Elsberry L Pre-anesthesia Checklist: Patient identified, Patient being monitored, Emergency Drugs available, Timeout performed and Suction available Patient Re-evaluated:Patient Re-evaluated prior to inductionOxygen Delivery Method: Circle system utilized Preoxygenation: Pre-oxygenation with 100% oxygen Intubation Type: IV induction Ventilation: Mask ventilation without difficulty LMA: LMA inserted LMA Size: 4.0 Number of attempts: 1 Placement Confirmation: breath sounds checked- equal and bilateral Tube secured with: Tape Dental Injury: Teeth and Oropharynx as per pre-operative assessment

## 2011-11-10 NOTE — Anesthesia Preprocedure Evaluation (Signed)
Anesthesia Evaluation  Patient identified by MRN, date of birth, ID band Patient awake    Reviewed: Allergy & Precautions, H&P , NPO status , Patient's Chart, lab work & pertinent test results  History of Anesthesia Complications Negative for: history of anesthetic complications  Airway Mallampati: II TM Distance: <3 FB Neck ROM: Full    Dental No notable dental hx.    Pulmonary neg pulmonary ROS,    Pulmonary exam normal       Cardiovascular negative cardio ROS  Rhythm:Regular Rate:Normal     Neuro/Psych negative neurological ROS  negative psych ROS   GI/Hepatic negative GI ROS, Neg liver ROS,   Endo/Other  negative endocrine ROS  Renal/GU negative Renal ROS     Musculoskeletal negative musculoskeletal ROS (+)   Abdominal Normal abdominal exam  (+)   Peds  Hematology negative hematology ROS (+)   Anesthesia Other Findings   Reproductive/Obstetrics                           Anesthesia Physical Anesthesia Plan  ASA: I  Anesthesia Plan: General   Post-op Pain Management:    Induction: Intravenous  Airway Management Planned: LMA  Additional Equipment:   Intra-op Plan:   Post-operative Plan: Extubation in OR  Informed Consent:   Plan Discussed with: CRNA  Anesthesia Plan Comments:         Anesthesia Quick Evaluation

## 2011-11-10 NOTE — Transfer of Care (Signed)
Immediate Anesthesia Transfer of Care Note  Patient: Dana Hanson  Procedure(s) Performed: Procedure(s) (LRB): HEMORRHOIDECTOMY (N/A)  Patient Location: PACU  Anesthesia Type: General  Level of Consciousness: awake, alert , oriented and patient cooperative  Airway & Oxygen Therapy: Patient Spontanous Breathing and Patient connected to face mask oxygen  Post-op Assessment: Report given to PACU RN and Post -op Vital signs reviewed and stable  Post vital signs: Reviewed and stable  Complications: No apparent anesthesia complications

## 2011-11-10 NOTE — Preoperative (Signed)
Beta Blockers   Reason not to administer Beta Blockers:Not Applicable 

## 2011-11-10 NOTE — Interval H&P Note (Signed)
History and Physical Interval Note:  11/10/2011 7:52 AM  Dana Hanson  has presented today for surgery, with the diagnosis of Hemorrhoids  The various methods of treatment have been discussed with the patient and family. After consideration of risks, benefits and other options for treatment, the patient has consented to  Procedure(s) (LRB): HEMORRHOIDECTOMY (N/A) as a surgical intervention .  The patients' history has been reviewed, patient examined, no change in status, stable for surgery.  I have reviewed the patients' chart and labs.  Questions were answered to the patient's satisfaction.     Carylon Tamburro C

## 2011-11-13 ENCOUNTER — Encounter (HOSPITAL_COMMUNITY): Payer: Self-pay | Admitting: General Surgery

## 2011-11-20 ENCOUNTER — Ambulatory Visit (HOSPITAL_COMMUNITY)
Admission: RE | Admit: 2011-11-20 | Discharge: 2011-11-20 | Disposition: A | Discharge status: Home / Self Care | Payer: BC Managed Care – PPO | Source: Ambulatory Visit | Attending: Internal Medicine | Admitting: Internal Medicine

## 2011-11-20 DIAGNOSIS — Z139 Encounter for screening, unspecified: Secondary | ICD-10-CM

## 2011-11-20 DIAGNOSIS — Z1231 Encounter for screening mammogram for malignant neoplasm of breast: Secondary | ICD-10-CM | POA: Insufficient documentation

## 2012-11-11 ENCOUNTER — Other Ambulatory Visit (HOSPITAL_COMMUNITY): Payer: Self-pay | Admitting: Internal Medicine

## 2012-11-11 DIAGNOSIS — Z139 Encounter for screening, unspecified: Secondary | ICD-10-CM

## 2012-11-21 ENCOUNTER — Ambulatory Visit (HOSPITAL_COMMUNITY)
Admission: RE | Admit: 2012-11-21 | Discharge: 2012-11-21 | Disposition: A | Discharge status: Home / Self Care | Payer: BC Managed Care – PPO | Source: Ambulatory Visit | Attending: Internal Medicine | Admitting: Internal Medicine

## 2012-11-21 DIAGNOSIS — Z139 Encounter for screening, unspecified: Secondary | ICD-10-CM

## 2012-11-21 DIAGNOSIS — Z1231 Encounter for screening mammogram for malignant neoplasm of breast: Secondary | ICD-10-CM | POA: Insufficient documentation

## 2013-11-17 ENCOUNTER — Other Ambulatory Visit (HOSPITAL_COMMUNITY): Payer: Self-pay | Admitting: Internal Medicine

## 2013-11-17 DIAGNOSIS — Z1231 Encounter for screening mammogram for malignant neoplasm of breast: Secondary | ICD-10-CM

## 2013-11-24 ENCOUNTER — Ambulatory Visit (HOSPITAL_COMMUNITY)
Admission: RE | Admit: 2013-11-24 | Discharge: 2013-11-24 | Disposition: A | Discharge status: Home / Self Care | Payer: BC Managed Care – PPO | Source: Ambulatory Visit | Attending: Internal Medicine | Admitting: Internal Medicine

## 2013-11-24 DIAGNOSIS — Z1231 Encounter for screening mammogram for malignant neoplasm of breast: Secondary | ICD-10-CM

## 2014-03-17 ENCOUNTER — Ambulatory Visit (INDEPENDENT_AMBULATORY_CARE_PROVIDER_SITE_OTHER): Payer: BC Managed Care – PPO | Admitting: Orthopedic Surgery

## 2014-03-17 ENCOUNTER — Encounter: Payer: Self-pay | Admitting: Orthopedic Surgery

## 2014-03-17 ENCOUNTER — Ambulatory Visit (INDEPENDENT_AMBULATORY_CARE_PROVIDER_SITE_OTHER): Payer: BC Managed Care – PPO

## 2014-03-17 VITALS — BP 127/82 | Ht 67.0 in | Wt 227.8 lb

## 2014-03-17 DIAGNOSIS — M1711 Unilateral primary osteoarthritis, right knee: Secondary | ICD-10-CM | POA: Insufficient documentation

## 2014-03-17 DIAGNOSIS — M25561 Pain in right knee: Secondary | ICD-10-CM

## 2014-03-17 DIAGNOSIS — M25569 Pain in unspecified knee: Secondary | ICD-10-CM

## 2014-03-17 DIAGNOSIS — M1710 Unilateral primary osteoarthritis, unspecified knee: Secondary | ICD-10-CM

## 2014-03-17 NOTE — Progress Notes (Signed)
New  Chief Complaint  Patient presents with  . Knee Pain    right knee pain, no known injury     -year-old female presents with pain on the medial side of the right knee for the last 2 years exacerbated by recent subclass associated with pain swelling and occasional symptoms of giving way especially with pivoting activities. Aching occurs intermittently mostly after activity and is partially relieved by intermittent use of Aleve. She's not had injection therapy or surgery.  Medical history is negative  She had plantar fasciitis with bilateral releases by Dr. Irving Shows. She's currently on no medication has no allergies. She does not know her family history as she was adopted. Her review of systems is recorded but highlights include night sweats ringing in the ears and need for contacts/glasses. All other systems were reviewed and were negative.   Vital signs are stable as recorded  General appearance is normal, body habitus , BP 127/82  Ht 5\' 7"  (1.702 m)  Wt 227 lb 12.8 oz (103.329 kg)  BMI 35.67 kg/m2  Mild to moderately overweight  The patient is alert and oriented x 3  The patient's mood and affect are normal  Gait assessment: No abnormality seen  The cardiovascular exam reveals normal pulses and temperature without edema or  swelling.  The lymphatic system is negative for palpable lymph nodes  The sensory exam is normal.  There are no pathologic reflexes.  Balance is normal.   Exam of the upper extremities Upper extremity exam  The right and left upper extremity:   Inspection revealed no abnormalities in the upper extremities.   Range of motion is full without contracture.  Muscle tone is normal  The joints are fully reduced without subluxation., There is no atrophy or tremor and muscle tone is normal.      Inspection left knee reveals no contracture subluxation atrophy or tremor and alignment is normal  Inspection of the right knee reveals mild knee  effusion with no tenderness. No crepitance. Range of motion is full and pain-free. The knee is stable in all planes.  Strength grade 5 motor strength  Skin normal, no rash, or laceration. 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  Medication depomedrol 40 mg and Lidocaine plain 3 cc   A sterile dressing was applied there were no complications

## 2014-03-17 NOTE — Patient Instructions (Signed)
You have received a steroid shot. 15% of patients experience increased pain at the injection site with in the next 24 hours. This is best treated with ice and tylenol extra strength 2 tabs every 8 hours. If you are still having pain please call the office.   Avoid high impact activity   Aleve and ice as needed    Arthritis, Nonspecific Arthritis is inflammation of a joint. This usually means pain, redness, warmth or swelling are present. One or more joints may be involved. There are a number of types of arthritis. Your caregiver may not be able to tell what type of arthritis you have right away. CAUSES  The most common cause of arthritis is the wear and tear on the joint (osteoarthritis). This causes damage to the cartilage, which can break down over time. The knees, hips, back and neck are most often affected by this type of arthritis. Other types of arthritis and common causes of joint pain include:  Sprains and other injuries near the joint. Sometimes minor sprains and injuries cause pain and swelling that develop hours later.  Rheumatoid arthritis. This affects hands, feet and knees. It usually affects both sides of your body at the same time. It is often associated with chronic ailments, fever, weight loss and general weakness.  Crystal arthritis. Gout and pseudo gout can cause occasional acute severe pain, redness and swelling in the foot, ankle, or knee.  Infectious arthritis. Bacteria can get into a joint through a break in overlying skin. This can cause infection of the joint. Bacteria and viruses can also spread through the blood and affect your joints.  Drug, infectious and allergy reactions. Sometimes joints can become mildly painful and slightly swollen with these types of illnesses. SYMPTOMS   Pain is the main symptom.  Your joint or joints can also be red, swollen and warm or hot to the touch.  You may have a fever with certain types of arthritis, or even feel overall  ill.  The joint with arthritis will hurt with movement. Stiffness is present with some types of arthritis. DIAGNOSIS  Your caregiver will suspect arthritis based on your description of your symptoms and on your exam. Testing may be needed to find the type of arthritis:  Blood and sometimes urine tests.  X-ray tests and sometimes CT or MRI scans.  Removal of fluid from the joint (arthrocentesis) is done to check for bacteria, crystals or other causes. Your caregiver (or a specialist) will numb the area over the joint with a local anesthetic, and use a needle to remove joint fluid for examination. This procedure is only minimally uncomfortable.  Even with these tests, your caregiver may not be able to tell what kind of arthritis you have. Consultation with a specialist (rheumatologist) may be helpful. TREATMENT  Your caregiver will discuss with you treatment specific to your type of arthritis. If the specific type cannot be determined, then the following general recommendations may apply. Treatment of severe joint pain includes:  Rest.  Elevation.  Anti-inflammatory medication (for example, ibuprofen) may be prescribed. Avoiding activities that cause increased pain.  Only take over-the-counter or prescription medicines for pain and discomfort as recommended by your caregiver.  Cold packs over an inflamed joint may be used for 10 to 15 minutes every hour. Hot packs sometimes feel better, but do not use overnight. Do not use hot packs if you are diabetic without your caregiver's permission.  A cortisone shot into arthritic joints may help reduce pain and  swelling.  Any acute arthritis that gets worse over the next 1 to 2 days needs to be looked at to be sure there is no joint infection. Long-term arthritis treatment involves modifying activities and lifestyle to reduce joint stress jarring. This can include weight loss. Also, exercise is needed to nourish the joint cartilage and remove  waste. This helps keep the muscles around the joint strong. HOME CARE INSTRUCTIONS   Do not take aspirin to relieve pain if gout is suspected. This elevates uric acid levels.  Only take over-the-counter or prescription medicines for pain, discomfort or fever as directed by your caregiver.  Rest the joint as much as possible.  If your joint is swollen, keep it elevated.  Use crutches if the painful joint is in your leg.  Drinking plenty of fluids may help for certain types of arthritis.  Follow your caregiver's dietary instructions.  Try low-impact exercise such as:  Swimming.  Water aerobics.  Biking.  Walking.  Morning stiffness is often relieved by a warm shower.  Put your joints through regular range-of-motion. SEEK MEDICAL CARE IF:   You do not feel better in 24 hours or are getting worse.  You have side effects to medications, or are not getting better with treatment. SEEK IMMEDIATE MEDICAL CARE IF:   You have a fever.  You develop severe joint pain, swelling or redness.  Many joints are involved and become painful and swollen.  There is severe back pain and/or leg weakness.  You have loss of bowel or bladder control. Document Released: 08/10/2004 Document Revised: 09/25/2011 Document Reviewed: 08/26/2008 [facility] Patient Information 2015 [place], [place]. This information is not intended to replace advice given to you by your health care provider. Make sure you discuss any questions you have with your health care provider.

## 2014-06-16 ENCOUNTER — Ambulatory Visit: Payer: BC Managed Care – PPO | Admitting: Orthopedic Surgery

## 2014-08-18 ENCOUNTER — Encounter: Payer: Self-pay | Admitting: Internal Medicine

## 2015-01-06 ENCOUNTER — Other Ambulatory Visit: Payer: Self-pay

## 2015-01-06 DIAGNOSIS — Z1231 Encounter for screening mammogram for malignant neoplasm of breast: Secondary | ICD-10-CM

## 2015-01-08 ENCOUNTER — Other Ambulatory Visit (HOSPITAL_COMMUNITY): Payer: Self-pay | Admitting: Internal Medicine

## 2015-01-08 DIAGNOSIS — Z1231 Encounter for screening mammogram for malignant neoplasm of breast: Secondary | ICD-10-CM

## 2015-01-11 ENCOUNTER — Ambulatory Visit: Payer: Self-pay

## 2015-01-14 ENCOUNTER — Ambulatory Visit (HOSPITAL_COMMUNITY): Payer: Self-pay

## 2015-01-15 HISTORY — PX: LAPAROSCOPIC GASTRIC SLEEVE RESECTION: SHX5895

## 2015-01-20 ENCOUNTER — Ambulatory Visit (HOSPITAL_COMMUNITY)
Admission: RE | Admit: 2015-01-20 | Discharge: 2015-01-20 | Disposition: A | Discharge status: Home / Self Care | Payer: 59 | Source: Ambulatory Visit | Attending: Internal Medicine | Admitting: Internal Medicine

## 2015-01-20 DIAGNOSIS — Z1231 Encounter for screening mammogram for malignant neoplasm of breast: Secondary | ICD-10-CM | POA: Diagnosis present

## 2015-08-21 ENCOUNTER — Emergency Department (HOSPITAL_COMMUNITY): Payer: BLUE CROSS/BLUE SHIELD

## 2015-08-21 ENCOUNTER — Inpatient Hospital Stay (HOSPITAL_COMMUNITY): Payer: BLUE CROSS/BLUE SHIELD

## 2015-08-21 ENCOUNTER — Inpatient Hospital Stay (HOSPITAL_COMMUNITY)
Admission: EM | Admit: 2015-08-21 | Discharge: 2015-08-24 | DRG: 440 | Disposition: A | Discharge status: Home / Self Care | Payer: BLUE CROSS/BLUE SHIELD | Attending: Family Medicine | Admitting: Family Medicine

## 2015-08-21 ENCOUNTER — Encounter (HOSPITAL_COMMUNITY): Payer: Self-pay | Admitting: Neurology

## 2015-08-21 DIAGNOSIS — N2889 Other specified disorders of kidney and ureter: Secondary | ICD-10-CM | POA: Insufficient documentation

## 2015-08-21 DIAGNOSIS — C641 Malignant neoplasm of right kidney, except renal pelvis: Secondary | ICD-10-CM | POA: Insufficient documentation

## 2015-08-21 DIAGNOSIS — K831 Obstruction of bile duct: Secondary | ICD-10-CM | POA: Diagnosis not present

## 2015-08-21 DIAGNOSIS — E785 Hyperlipidemia, unspecified: Secondary | ICD-10-CM | POA: Diagnosis present

## 2015-08-21 DIAGNOSIS — K851 Biliary acute pancreatitis without necrosis or infection: Principal | ICD-10-CM | POA: Diagnosis present

## 2015-08-21 DIAGNOSIS — Z9884 Bariatric surgery status: Secondary | ICD-10-CM

## 2015-08-21 DIAGNOSIS — K649 Unspecified hemorrhoids: Secondary | ICD-10-CM | POA: Diagnosis present

## 2015-08-21 DIAGNOSIS — K859 Acute pancreatitis without necrosis or infection, unspecified: Secondary | ICD-10-CM | POA: Diagnosis present

## 2015-08-21 LAB — COMPREHENSIVE METABOLIC PANEL
ALBUMIN: 4.1 g/dL (ref 3.5–5.0)
ALK PHOS: 50 U/L (ref 38–126)
ALT: 12 U/L — AB (ref 14–54)
AST: 27 U/L (ref 15–41)
Anion gap: 11 (ref 5–15)
BILIRUBIN TOTAL: 1 mg/dL (ref 0.3–1.2)
BUN: 14 mg/dL (ref 6–20)
CALCIUM: 10.1 mg/dL (ref 8.9–10.3)
CO2: 27 mmol/L (ref 22–32)
CREATININE: 0.94 mg/dL (ref 0.44–1.00)
Chloride: 107 mmol/L (ref 101–111)
GFR calc Af Amer: >60 mL/min (ref >60)
GFR calc non Af Amer: >60 mL/min (ref >60)
GLUCOSE: 106 mg/dL — AB (ref 65–99)
Potassium: 4.1 mmol/L (ref 3.5–5.1)
SODIUM: 145 mmol/L (ref 135–145)
TOTAL PROTEIN: 6.6 g/dL (ref 6.5–8.1)

## 2015-08-21 LAB — URINE MICROSCOPIC-ADD ON
Bacteria, UA: NONE SEEN
WBC UA: NONE SEEN WBC/hpf (ref 0–5)

## 2015-08-21 LAB — URINALYSIS, ROUTINE W REFLEX MICROSCOPIC
BILIRUBIN URINE: NEGATIVE
Glucose, UA: NEGATIVE mg/dL
HGB URINE DIPSTICK: NEGATIVE
KETONES UR: 15 mg/dL — AB
Leukocytes, UA: NEGATIVE
NITRITE: NEGATIVE
Protein, ur: NEGATIVE mg/dL
SPECIFIC GRAVITY, URINE: 1.017 (ref 1.005–1.030)
pH: 8 (ref 5.0–8.0)

## 2015-08-21 LAB — I-STAT TROPONIN, ED: TROPONIN I, POC: 0 ng/mL (ref 0.00–0.08)

## 2015-08-21 LAB — CBC
HCT: 39.8 % (ref 36.0–46.0)
HEMOGLOBIN: 13.2 g/dL (ref 12.0–15.0)
MCH: 28.2 pg (ref 26.0–34.0)
MCHC: 33.2 g/dL (ref 30.0–36.0)
MCV: 85 fL (ref 78.0–100.0)
PLATELETS: 232 10*3/uL (ref 150–400)
RBC: 4.68 MIL/uL (ref 3.87–5.11)
RDW: 12.9 % (ref 11.5–15.5)
WBC: 8.3 10*3/uL (ref 4.0–10.5)

## 2015-08-21 LAB — LIPASE, BLOOD: Lipase: >3000 U/L — ABNORMAL HIGH (ref 11–51)

## 2015-08-21 MED ORDER — ACETAMINOPHEN 650 MG RE SUPP: 650.0000 mg | Freq: Four times a day (QID) | RECTAL | Status: DC | PRN

## 2015-08-21 MED ORDER — ONDANSETRON HCL 4 MG/2ML IJ SOLN
4.0000 mg | Freq: Four times a day (QID) | INTRAMUSCULAR | Status: DC | PRN
  Administered 2015-08-22: 4 mg via INTRAVENOUS
  Filled 2015-08-21: qty 2

## 2015-08-21 MED ORDER — INFLUENZA VAC SPLIT QUAD 0.5 ML IM SUSY
0.5000 mL | PREFILLED_SYRINGE | INTRAMUSCULAR | Status: AC
  Administered 2015-08-22: 0.5 mL via INTRAMUSCULAR
  Filled 2015-08-21: qty 0.5

## 2015-08-21 MED ORDER — ADULT MULTIVITAMIN W/MINERALS CH
1.0000 | ORAL_TABLET | Freq: Every day | ORAL | Status: DC
  Administered 2015-08-22 – 2015-08-24 (×3): 1 via ORAL
  Filled 2015-08-21 (×3): qty 1

## 2015-08-21 MED ORDER — FENTANYL CITRATE (PF) 100 MCG/2ML IJ SOLN
100.0000 ug | Freq: Once | INTRAMUSCULAR | Status: AC
  Administered 2015-08-21: 100 ug via INTRAVENOUS
  Filled 2015-08-21: qty 2

## 2015-08-21 MED ORDER — ONDANSETRON HCL 4 MG PO TABS: 4.0000 mg | ORAL_TABLET | Freq: Four times a day (QID) | ORAL | Status: DC | PRN

## 2015-08-21 MED ORDER — DEXTROSE IN LACTATED RINGERS 5 % IV SOLN
INTRAVENOUS | Status: DC
  Administered 2015-08-21 – 2015-08-22 (×2): via INTRAVENOUS

## 2015-08-21 MED ORDER — ENOXAPARIN SODIUM 40 MG/0.4ML ~~LOC~~ SOLN
40.0000 mg | SUBCUTANEOUS | Status: DC
  Administered 2015-08-21 – 2015-08-23 (×3): 40 mg via SUBCUTANEOUS
  Filled 2015-08-21 (×4): qty 0.4

## 2015-08-21 MED ORDER — SODIUM CHLORIDE 0.9 % IV BOLUS (SEPSIS)
1000.0000 mL | Freq: Once | INTRAVENOUS | Status: AC
  Administered 2015-08-21: 1000 mL via INTRAVENOUS

## 2015-08-21 MED ORDER — SODIUM CHLORIDE 0.9 % IV SOLN
INTRAVENOUS | Status: DC
  Administered 2015-08-21 – 2015-08-22 (×3): via INTRAVENOUS

## 2015-08-21 MED ORDER — CALCIUM CARBONATE 1250 (500 CA) MG PO TABS
1.0000 | ORAL_TABLET | Freq: Every day | ORAL | Status: DC
  Administered 2015-08-22 – 2015-08-24 (×3): 500 mg via ORAL
  Filled 2015-08-21 (×3): qty 1

## 2015-08-21 MED ORDER — ACETAMINOPHEN 325 MG PO TABS
650.0000 mg | ORAL_TABLET | Freq: Four times a day (QID) | ORAL | Status: DC | PRN
  Administered 2015-08-23: 650 mg via ORAL
  Filled 2015-08-21 (×2): qty 2

## 2015-08-21 MED ORDER — HYDROMORPHONE HCL 1 MG/ML IJ SOLN
1.0000 mg | Freq: Once | INTRAMUSCULAR | Status: AC
  Administered 2015-08-21: 1 mg via INTRAVENOUS
  Filled 2015-08-21: qty 1

## 2015-08-21 MED ORDER — MORPHINE SULFATE (PF) 2 MG/ML IV SOLN
1.0000 mg | INTRAVENOUS | Status: DC | PRN
  Administered 2015-08-21: 1 mg via INTRAVENOUS
  Filled 2015-08-21: qty 1

## 2015-08-21 MED ORDER — ONDANSETRON HCL 4 MG/2ML IJ SOLN
4.0000 mg | Freq: Once | INTRAMUSCULAR | Status: AC
  Administered 2015-08-21: 4 mg via INTRAVENOUS
  Filled 2015-08-21: qty 2

## 2015-08-21 MED ORDER — HYDROMORPHONE HCL 1 MG/ML IJ SOLN
1.0000 mg | INTRAMUSCULAR | Status: DC | PRN
  Administered 2015-08-21 – 2015-08-22 (×2): 1 mg via INTRAVENOUS
  Filled 2015-08-21 (×2): qty 1

## 2015-08-21 NOTE — Consult Note (Signed)
Reason for Consult:gallstone pancreatitis Referring Physician: Dr. Kerrin Mo  Dana Hanson is an 55 y.o. female.  HPI: This is a pleasant female who was admitted earlier today with severe right upper quadrant epigastric abdominal discomfort. This pain has been present for less than 24 hours. She had no nausea or vomiting. She has no previous history of similar abdominal pain. She was found to have a lipase of greater than 3000. She had an ultrasound showing gallstones. She was also found to have a 7.2 cm solid mass in the lower pole of the right kidney. She has no hematuria. She underwent a gastric sleeve proximal 6 months ago. She describes the pain as sharp and constant. Bowel movements a been normal  Past Medical History  Diagnosis Date  . Hyperlipidemia     Past Surgical History  Procedure Laterality Date  . Cesarean section      x 2-tubal with last c-section  . Colonoscopy  08/14/2011    Procedure: COLONOSCOPY;  Surgeon: Daneil Dolin, MD;  [facility]: [facility];  Service: Endoscopy;  Laterality: N/A;  10:00  . Tubal ligation      with last c-section  . Hemorrhoid surgery  11/10/2011    Procedure: HEMORRHOIDECTOMY;  Surgeon: Donato Heinz, MD;  Location: [facility];  Service: General;  Laterality: N/A;    Family History  Problem Relation Age of Onset  . Colon cancer Neg Hx   . Liver disease Neg Hx   . Anesthesia problems Neg Hx   . Hypotension Neg Hx   . Malignant hyperthermia Neg Hx   . Pseudochol deficiency Neg Hx     Social History:  reports that she has never smoked. She does not have any smokeless tobacco history on file. She reports that she drinks alcohol. She reports that she does not use illicit drugs.  Allergies: No Known Allergies  Medications: I have reviewed the patient's current medications.  Results for orders placed or performed during the hospital encounter of 08/21/15 (from the past 48 hour(s))  Lipase, blood     Status: Abnormal   Collection  Time: 08/21/15  2:45 PM  Result Value Ref Range   Lipase >3000 (H) 11 - 51 U/L    Comment: RESULTS CONFIRMED BY MANUAL DILUTION  Comprehensive metabolic panel     Status: Abnormal   Collection Time: 08/21/15  2:45 PM  Result Value Ref Range   Sodium 145 135 - 145 mmol/L   Potassium 4.1 3.5 - 5.1 mmol/L   Chloride 107 101 - 111 mmol/L   CO2 27 22 - 32 mmol/L   Glucose, Bld 106 (H) 65 - 99 mg/dL   BUN 14 6 - 20 mg/dL   Creatinine, Ser 0.94 0.44 - 1.00 mg/dL   Calcium 10.1 8.9 - 10.3 mg/dL   Total Protein 6.6 6.5 - 8.1 g/dL   Albumin 4.1 3.5 - 5.0 g/dL   AST 27 15 - 41 U/L   ALT 12 (L) 14 - 54 U/L   Alkaline Phosphatase 50 38 - 126 U/L   Total Bilirubin 1.0 0.3 - 1.2 mg/dL   GFR calc non Af Amer >60 >60 mL/min   GFR calc Af Amer >60 >60 mL/min    Comment: (NOTE) The eGFR has been calculated using the CKD EPI equation. This calculation has not been validated in all clinical situations. eGFR's persistently <60 mL/min signify possible Chronic Kidney Disease.    Anion gap 11 5 - 15  CBC  Status: None   Collection Time: 08/21/15  2:45 PM  Result Value Ref Range   WBC 8.3 4.0 - 10.5 K/uL   RBC 4.68 3.87 - 5.11 MIL/uL   Hemoglobin 13.2 12.0 - 15.0 g/dL   HCT 39.8 36.0 - 46.0 %   MCV 85.0 78.0 - 100.0 fL   MCH 28.2 26.0 - 34.0 pg   MCHC 33.2 30.0 - 36.0 g/dL   RDW 12.9 11.5 - 15.5 %   Platelets 232 150 - 400 K/uL  I-Stat Troponin, ED (not at [facility])     Status: None   Collection Time: 08/21/15  2:53 PM  Result Value Ref Range   Troponin i, poc 0.00 0.00 - 0.08 ng/mL   Comment 3            Comment: Due to the release kinetics of cTnI, a negative result within the first hours of the onset of symptoms does not rule out myocardial infarction with certainty. If myocardial infarction is still suspected, repeat the test at appropriate intervals.   Urinalysis, Routine w reflex microscopic (not at [facility])     Status: Abnormal   Collection Time: 08/21/15  5:02 PM  Result Value Ref  Range   Color, Urine YELLOW YELLOW   APPearance TURBID (A) CLEAR   Specific Gravity, Urine 1.017 1.005 - 1.030   pH 8.0 5.0 - 8.0   Glucose, UA NEGATIVE NEGATIVE mg/dL   Hgb urine dipstick NEGATIVE NEGATIVE   Bilirubin Urine NEGATIVE NEGATIVE   Ketones, ur 15 (A) NEGATIVE mg/dL   Protein, ur NEGATIVE NEGATIVE mg/dL   Nitrite NEGATIVE NEGATIVE   Leukocytes, UA NEGATIVE NEGATIVE  Urine microscopic-add on     Status: Abnormal   Collection Time: 08/21/15  5:02 PM  Result Value Ref Range   Squamous Epithelial / LPF 0-5 (A) NONE SEEN   WBC, UA NONE SEEN 0 - 5 WBC/hpf   RBC / HPF 0-5 0 - 5 RBC/hpf   Bacteria, UA NONE SEEN NONE SEEN    Us Abdomen Limited  08/21/2015  CLINICAL DATA:  54-year-old female with acute right upper quadrant pain since 1330 hours today. Initial encounter. EXAM: US ABDOMEN LIMITED - RIGHT UPPER QUADRANT COMPARISON:  None. FINDINGS: Gallbladder: Multiple echogenic stones with shadowing, individually up to 19 mm diameter. These appear mobile. However, there is mild gallbladder wall thickening up to 5 mm. No pericholecystic fluid identified. No sonographic Murphy sign elicited. Common bile duct: Diameter: 6 mm, within normal limits Liver: Overall liver echogenicity is within normal limits. No discrete liver lesion, but suggestion of central intrahepatic biliary ductal dilatation in the right lobe (image 13). The left lobe appears spared. Other findings: There is a solid, vascular 7.2 cm mass in the lower pole of the right kidney (image 32 and 33). In all this measures 6.3 x 7.2 x 6.8 cm. The right upper pole appears normal. The hilum is partially obscured by this mass. No abdominal free fluid identified. IMPRESSION: 1. Incidentally discovered 7.2 cm vascular mass in the lower pole of the right kidney most compatible with Renal Cell Carcinoma. Recommend Urology consultation. 2. Cholelithiasis with mild gallbladder wall thickening with right intrahepatic biliary ductal enlargement.  Despite absent sonographic Murphy sign, the appearance is highly suspicious for acute cholecystitis and/or acute biliary obstruction. Electronically Signed   By: H  Hall M.D.   On: 08/21/2015 16:40    Review of Systems  All other systems reviewed and are negative.  Blood pressure 145/84, pulse 83, temperature 98 F (  36.7 C), temperature source Oral, resp. rate 16, last menstrual period 10/27/2013, SpO2 97 %. Physical Exam  Constitutional: She is oriented to person, place, and time. She appears well-developed and well-nourished.  Uncomfortable in appearance  HENT:  Head: Normocephalic and atraumatic.  Right Ear: External ear normal.  Left Ear: External ear normal.  Nose: Nose normal.  Mouth/Throat: Oropharynx is clear and moist. No oropharyngeal exudate.  Eyes: Conjunctivae are normal. Pupils are equal, round, and reactive to light. Right eye exhibits no discharge. Left eye exhibits no discharge. No scleral icterus.  Neck: Normal range of motion. No tracheal deviation present.  Cardiovascular: Normal rate, regular rhythm, normal heart sounds and intact distal pulses.   Respiratory: Effort normal and breath sounds normal. No respiratory distress. She has no wheezes.  GI: Soft. She exhibits no distension. There is tenderness. There is guarding.  There is tenderness and guarding in the epigastrium  Musculoskeletal: Normal range of motion. She exhibits no edema or tenderness.  Lymphadenopathy:    She has no cervical adenopathy.  Neurological: She is alert and oriented to person, place, and time.  Skin: Skin is warm and dry. She is not diaphoretic. No erythema.  Psychiatric: Her behavior is normal. Judgment normal.    Assessment/Plan: Gallstone pancreatitis  I discussed the diagnosis with her in detail. Urology will need to see the patient regarding the renal mass. She will need a CAT scan of the abdomen and pelvis with IV contrast to evaluate the renal mass. We would not proceed with  cholecystectomy until the pancreatitis has resolved. This may or may not be able to be done as a combined procedure with Urology.  Would continue bowel rest, IV rehydration, and pain control. We will follow her with you  Arianna Delsanto A 08/21/2015, 8:17 PM

## 2015-08-21 NOTE — H&P (Signed)
[facility] Admission History and Physical Service Pager: [phone]  Patient name: Dana Hanson Medical record number: [id number] Date of birth: 05-Apr-1961 Age: 55 y.o. Gender: female  Primary Care Provider: [name], MD Consultants: Surgery and Urology  Code Status: Full  Chief Complaint:   Assessment and Plan: Dana Hanson is a 55 y.o. female presenting with RUQ and epigastric tenderness. PMH is significant for HLD,Osteoarthitis, hx of hemorrhoids   #RUQ/ Epigastric Tenderness: Sudden onset of 10/10 epigastric and right upper quadrant pain, patient with recent history of gastric sleeve within the last 6 months increasing likelihood of cholelithiasis for this patient. On admission, Lipase > 3000. Likely patient with pancreatitis due to bilary obstruction as RUQ u/s significant for cholilethasis with mild gallbladder wall thickening. CBC wnl, bilirubin and  alkaline phosphatase wnl, no fever or malaise  inidicating this likely a less severe presentation of biliary obstruction. From clinical presentation, pain most likely due to mild/moderate pancreatitis.  - Admit to [facility], attending Dr. Erin Hearing - Follow-up morning CMP and CBC - Consult surgery - Maintenance IV fluids @ 125 ml  - Keep  NPO for now, advance as tolerated  - Morphine 1 mg IV q2h for pain - Zofran for nausea.  - Vitals per floor protocol   #Incidental Renal Mass: Ultrasound reading consistent with possible renal cell carcinoma  - Spoke with urology and they will set up as outpatient - CXR - CT ab/pelvis w and w/out contrast.   #History of gastric sleeve bypass: has lost 68 lbs since July 2016.   FEN/GI: NPO, MIVF @ 125 ml,  Prophylaxis: Lovenox   Disposition: Med-surg   History of Present Illness:  Dana Hanson is a 55 y.o. female presenting with epigastric/RUQ pain. Sudden onset of central abdominal pain starting today.  Increasing pain when patient was about to sit  down to eat lunch today. Patient called ambulance due to severity of pain. Radiates to her back. No prior history of abdominal pain or episodes of pain associated with eating. Currently pain is about a 6/10, it waxes and wanes. Some improvement with pain medication provided in the ED. Patient does have a recent history of gastric sleeve bypass in July with Dr. Mick Sell, [place].  Denies fevers or chill. Endorses some nausea but no vomiting.  Patient indicates recent weight loss of about  68 lbs since her bypass.  Review Of Systems: Per HPI with the following additions:  Review of Systems  Constitutional: Negative for fever and weight loss.  Cardiovascular: Negative for chest pain and palpitations.  Gastrointestinal: Positive for nausea and abdominal pain. Negative for vomiting, diarrhea, constipation, blood in stool and melena.  Genitourinary: Negative for dysuria, urgency, frequency and hematuria.  Musculoskeletal: Negative for back pain and neck pain.   Otherwise the remainder of the systems were negative.  Patient Active Problem List   Diagnosis Date Noted  . Primary osteoarthritis of right knee 03/17/2014  . Hemorrhoids 08/01/2011  . Heme positive stool 08/01/2011  . Rectal bleed 08/01/2011    Past Medical History: Past Medical History  Diagnosis Date  . Hyperlipidemia     Past Surgical History: Past Surgical History  Procedure Laterality Date  . Cesarean section      x 2-tubal with last c-section  . Colonoscopy  08/14/2011    Procedure: COLONOSCOPY;  Surgeon: Daneil Dolin, MD;  [facility]: [facility];  Service: Endoscopy;  Laterality: N/A;  10:00  . Tubal ligation  with last c-section  . Hemorrhoid surgery  11/10/2011    Procedure: HEMORRHOIDECTOMY;  Surgeon: Donato Heinz, MD;  Location: [facility];  Service: General;  Laterality: N/A;    Social History: Social History  Substance Use Topics  . Smoking status: Never Smoker   . Smokeless tobacco: None  .  Alcohol Use: Yes     Comment: rarely   Additional social history: Never smoker or illicit drug use. Rare EtOH use.  Please also refer to relevant sections of EMR.  Family History: Family History  Problem Relation Age of Onset  . Colon cancer Neg Hx   . Liver disease Neg Hx   . Anesthesia problems Neg Hx   . Hypotension Neg Hx   . Malignant hyperthermia Neg Hx   . Pseudochol deficiency Neg Hx      Allergies and Medications: No Known Allergies No current facility-administered medications on file prior to encounter.   Current Outpatient Prescriptions on File Prior to Encounter  Medication Sig Dispense Refill  . ibuprofen (ADVIL,MOTRIN) 200 MG tablet Take 400 mg by mouth every 6 (six) hours as needed. For pain    . Multiple Vitamin (MULITIVITAMIN WITH MINERALS) TABS Take 1 tablet by mouth daily.      Objective: BP 127/78 mmHg  Pulse 74  Temp(Src) 98 F (36.7 C) (Oral)  Resp 16  SpO2 98%  LMP 10/27/2013 Exam: General:  Lying in bed, NAD  Eyes: PERRL, EOMI  ENTM: Mucosa membranes moist, no nasal discharge  Neck: No lymphadenopathy or thyroid mass  Cardiovascular:Regular rate and rhythm.  No murmurs, gallops or rubs.    Respiratory: CTAB, no wheezes, or rhonchi  Abdomen: BS+, ttp in RUQ and epigastric area, positive rebound tenderness, positive murphy's sign  MSK: No lower extremity edema  Skin: no sores or suspicious lesions or rashes or color changes Neuro: Strength equal & normal in upper & lower extremities and normal sensation throughout  Psych: Cognition and judgment appear intact.   Labs and Imaging: CBC BMET   Recent Labs Lab 08/21/15 1445  WBC 8.3  HGB 13.2  HCT 39.8  PLT 232    Recent Labs Lab 08/21/15 1445  NA 145  K 4.1  CL 107  CO2 27  BUN 14  CREATININE 0.94  GLUCOSE 106*  CALCIUM 10.1      Results for Dana Hanson, Dana Hanson (MRN [id number]) as of 08/21/2015 17:58  Ref. Range 08/21/2015 14:45  Alkaline Phosphatase Latest Ref Range: 38-126 U/L  50  Albumin Latest Ref Range: 3.5-5.0 g/dL 4.1  Lipase Latest Ref Range: 11-51 U/L >3000 (H)  AST Latest Ref Range: 15-41 U/L 27  ALT Latest Ref Range: 14-54 U/L 12 (L)  Total Protein Latest Ref Range: 6.5-8.1 g/dL 6.6  Total Bilirubin Latest Ref Range: 0.3-1.2 mg/dL 1.0     US Abdomen Limited  08/21/2015  CLINICAL DATA:  55 year old female with acute right upper quadrant pain since 1330 hours today. Initial encounter. EXAM: US ABDOMEN LIMITED - RIGHT UPPER QUADRANT COMPARISON:  None. FINDINGS: Gallbladder: Multiple echogenic stones with shadowing, individually up to 19 mm diameter. These appear mobile. However, there is mild gallbladder wall thickening up to 5 mm. No pericholecystic fluid identified. No sonographic Murphy sign elicited. Common bile duct: Diameter: 6 mm, within normal limits Liver: Overall liver echogenicity is within normal limits. No discrete liver lesion, but suggestion of central intrahepatic biliary ductal dilatation in the right lobe (image 13). The left lobe appears spared. Other findings: There is a solid, vascular  7.2 cm mass in the lower pole of the right kidney (image 32 and 33). In all this measures 6.3 x 7.2 x 6.8 cm. The right upper pole appears normal. The hilum is partially obscured by this mass. No abdominal free fluid identified. IMPRESSION: 1. Incidentally discovered 7.2 cm vascular mass in the lower pole of the right kidney most compatible with Renal Cell Carcinoma. Recommend Urology consultation. 2. Cholelithiasis with mild gallbladder wall thickening with right intrahepatic biliary ductal enlargement. Despite absent sonographic Murphy sign, the appearance is highly suspicious for acute cholecystitis and/or acute biliary obstruction. Electronically Signed   By: Genevie Ann M.D.   On: 08/21/2015 16:40     Asiyah Cletis Media, MD 08/21/2015, 4:48 PM PGY-1, [facility] pager: [phone], text pages welcome  Upper Level Addendum:  I have seen  and evaluated this patient along with Dr. Emmaline Life and reviewed the above note, making necessary revisions in [facility].   Clearance Coots, MD Family Medicine PGY-3

## 2015-08-21 NOTE — Progress Notes (Signed)
Dana Hanson [id number] Admission Data: 08/21/2015 7:01 PM Attending Provider: Lind Covert, MD  [id number], MD Consults/ Treatment Team:    Dana Hanson is a 55 y.o. female patient admitted from ED awake, alert  & orientated  X 3,  Full Code, VSS - Blood pressure 145/84, pulse 83, temperature 98 F (36.7 C), temperature source Oral, resp. rate 16, last menstrual period 10/27/2013, SpO2 97 %.,no c/o shortness of breath, no c/o chest pain, no distress noted. Tele # 19 placed and pt is currently running:normal sinus rhythm.   IV site WDL:  antecubital left, condition patent and no redness with a transparent dsg that's clean dry and intact.  Allergies:  No Known Allergies   Past Medical History  Diagnosis Date  . Hyperlipidemia     History:  obtained from chart review. Tobacco/alcohol: denied none  Pt orientation to unit, room and routine. Information packet given to patient/family and safety video watched.  Admission INP armband ID verified with patient/family, and in place. SR up x 2, fall risk assessment complete with Patient and family verbalizing understanding of risks associated with falls. Pt verbalizes an understanding of how to use the call bell and to call for help before getting out of bed.  Skin, clean-dry- intact without evidence of bruising, or skin tears.   No evidence of skin break down noted on exam. color normal, vascularity normal, no rashes or suspicious lesions, no evidence of bleeding or bruising, no lesions noted, no rash, no edema, temperature normal, texture normal, mobility and turgor normal, nails normal without clubbing    Will cont to monitor and assist as needed.  Salley Slaughter, RN 08/21/2015 7:01 PM

## 2015-08-21 NOTE — ED Notes (Addendum)
Per ems- pt was eating lunch at [employer], had ordered but hadn't eaten anything when she developed sharp epigastric pain. She left sat in her car and the pain got much worse, like a knife was stabbing her. Has hx of gastric sleeve in July 2016. Pain felt better when standing any lying flat. Pain has subsided now at 3/10, appears comfortable. BP 140/86, HR 76, 98%RA.

## 2015-08-21 NOTE — ED Notes (Signed)
Patient transported to Ultrasound 

## 2015-08-21 NOTE — ED Provider Notes (Signed)
CSN: [id number]     Arrival date & time 08/21/15  1425 History   First MD Initiated Contact with Patient 08/21/15 1432     Chief Complaint  Patient presents with  . Abdominal Pain     (Consider location/radiation/quality/duration/timing/severity/associated sxs/prior Treatment) Patient is a 55 y.o. female presenting with abdominal pain.  Abdominal Pain Pain location:  Epigastric Pain quality: sharp, shooting and stabbing   Pain radiates to:  Back Pain severity:  Severe Onset quality:  Sudden Progression:  Improving Chronicity:  New Context: not alcohol use, not previous surgeries and not recent illness   Relieved by:  None tried Worsened by:  Nothing tried Ineffective treatments:  None tried Associated symptoms: no anorexia, no chest pain, no chills, no cough, no diarrhea, no fatigue, no fever, no flatus, no nausea, no shortness of breath and no vomiting     Past Medical History  Diagnosis Date  . Hyperlipidemia    Past Surgical History  Procedure Laterality Date  . Cesarean section      x 2-tubal with last c-section  . Colonoscopy  08/14/2011    Procedure: COLONOSCOPY;  Surgeon: Daneil Dolin, MD;  Location: [facility];  Service: Endoscopy;  Laterality: N/A;  10:00  . Tubal ligation      with last c-section  . Hemorrhoid surgery  11/10/2011    Procedure: HEMORRHOIDECTOMY;  Surgeon: Donato Heinz, MD;  Location: [facility];  Service: General;  Laterality: N/A;   Family History  Problem Relation Age of Onset  . Colon cancer Neg Hx   . Liver disease Neg Hx   . Anesthesia problems Neg Hx   . Hypotension Neg Hx   . Malignant hyperthermia Neg Hx   . Pseudochol deficiency Neg Hx    Social History  Substance Use Topics  . Smoking status: Never Smoker   . Smokeless tobacco: None  . Alcohol Use: Yes     Comment: rarely   OB History    No data available     Review of Systems  Constitutional: Negative for fever, chills and fatigue.  Eyes: Negative for pain.   Respiratory: Negative for cough and shortness of breath.   Cardiovascular: Negative for chest pain, palpitations and leg swelling.  Gastrointestinal: Positive for abdominal pain. Negative for nausea, vomiting, diarrhea, anorexia and flatus.  All other systems reviewed and are negative.     Allergies  Review of patient's allergies indicates no known allergies.  Home Medications   Prior to Admission medications   Medication Sig Start Date End Date Taking? Authorizing Provider  calcium carbonate (OS-CAL - DOSED IN MG OF ELEMENTAL CALCIUM) 1250 (500 Ca) MG tablet Take 1 tablet by mouth daily with breakfast.   Yes Historical Provider, MD  ferrous sulfate 325 (65 FE) MG tablet Take 325 mg by mouth daily with breakfast.   Yes Historical Provider, MD  ibuprofen (ADVIL,MOTRIN) 200 MG tablet Take 400 mg by mouth every 6 (six) hours as needed. For pain   Yes Historical Provider, MD  Multiple Vitamin (MULITIVITAMIN WITH MINERALS) TABS Take 1 tablet by mouth daily.   Yes Historical Provider, MD  Multiple Vitamins-Minerals (HAIR/SKIN/NAILS) TABS Take 2 tablets by mouth daily.   Yes Historical Provider, MD   BP 99/50 mmHg  Pulse 56  Temp(Src) 98.7 F (37.1 C) (Oral)  Resp 18  SpO2 97%  LMP 10/27/2013 Physical Exam  Constitutional: She is oriented to person, place, and time. She appears well-developed and well-nourished.  HENT:  Head: Normocephalic and  atraumatic.  Eyes: Pupils are equal, round, and reactive to light.  Neck: Normal range of motion.  Cardiovascular: Normal rate and regular rhythm.   Pulmonary/Chest: No stridor. No respiratory distress. She has no wheezes.  Abdominal: Soft. She exhibits no distension. There is tenderness (minimal in epigastric region). There is no rebound.  Musculoskeletal: Normal range of motion. She exhibits no edema or tenderness.  Neurological: She is alert and oriented to person, place, and time.  Skin: Skin is warm.  Nursing note and vitals  reviewed.   ED Course  Procedures (including critical care time) Labs Review Labs Reviewed  LIPASE, BLOOD - Abnormal; Notable for the following:    Lipase >3000 (*)    All other components within normal limits  COMPREHENSIVE METABOLIC PANEL - Abnormal; Notable for the following:    Glucose, Bld 106 (*)    ALT 12 (*)    All other components within normal limits  URINALYSIS, ROUTINE W REFLEX MICROSCOPIC (NOT AT [facility]) - Abnormal; Notable for the following:    APPearance TURBID (*)    Ketones, ur 15 (*)    All other components within normal limits  URINE MICROSCOPIC-ADD ON - Abnormal; Notable for the following:    Squamous Epithelial / LPF 0-5 (*)    All other components within normal limits  COMPREHENSIVE METABOLIC PANEL - Abnormal; Notable for the following:    Glucose, Bld 123 (*)    Calcium 8.6 (*)    Total Protein 5.8 (*)    Albumin 3.4 (*)    AST 223 (*)    ALT 142 (*)    All other components within normal limits  CBC  CBC  PREGNANCY, URINE  I-STAT TROPOININ, ED    Imaging Review Ct Abdomen Pelvis W Wo Contrast  08/22/2015  CLINICAL DATA:  Renal cell carcinoma.  Gallstone pancreatitis. EXAM: CT ABDOMEN AND PELVIS WITHOUT AND WITH CONTRAST TECHNIQUE: Multidetector CT imaging of the abdomen and pelvis was performed following the standard protocol before and following the bolus administration of intravenous contrast. CONTRAST:  143mL OMNIPAQUE IOHEXOL 350 MG/ML SOLN COMPARISON:  Sonography from yesterday FINDINGS: Lower chest and abdominal wall:  Fatty left lumbar hernia. Hepatobiliary: Cholelithiasis with gallbladder wall thickening and pericholecystic edema, correlating with the sonography findings. No common bile duct enlargement or calcified choledocholithiasis. Perfusion anomalies around the gallbladder fossa. Pancreas: No evidence of metastasis.  No evidence of pancreatitis. Spleen: Unremarkable. Adrenals/Urinary Tract:  Negative adrenals. Solid avidly enhancing 8 cm mass  arising from the lower pole right kidney consistent with renal cell carcinoma. Mass extends beyond the margins of the cortex into the perinephric fat, but does not clearly grow beyond Gerota's fascia. There is no visible renal vein invasion or adenopathy. Single right renal artery. Two left renal arteries. No notable left renal scarring. Other renal cortical low densities appear cystic. Reproductive:Negative. Stomach/Bowel: No obstruction. No appendicitis. Mild colonic diverticulosis. Gastric sleeve procedure. Vascular/Lymphatic: No acute vascular abnormality. No mass or adenopathy. Peritoneal: Small pelvic fluid considered reactive. Musculoskeletal: No acute abnormalities. IMPRESSION: 1. CT findings of acute cholecystitis, as seen on preceding sonography. No imaging correlate for patient's pancreatitis. 2. 8 cm right lower pole renal mass consistent with renal cell carcinoma. There is perinephric fat invasion without renal vein involvement or adenopathy. Electronically Signed   By: Monte Fantasia M.D.   On: 08/22/2015 03:22   Dg Chest 2 View  08/22/2015  CLINICAL DATA:  Pancreatitis. EXAM: CHEST  2 VIEW COMPARISON:  None. FINDINGS: The cardiomediastinal contours are normal.  Borderline hyperinflation. Pulmonary vasculature is normal. No consolidation, pleural effusion, or pneumothorax. No acute osseous abnormalities are seen. IMPRESSION: No acute pulmonary process. Electronically Signed   By: Jeb Levering M.D.   On: 08/22/2015 03:12   US Abdomen Limited  08/21/2015  CLINICAL DATA:  55 year old female with acute right upper quadrant pain since 1330 hours today. Initial encounter. EXAM: US ABDOMEN LIMITED - RIGHT UPPER QUADRANT COMPARISON:  None. FINDINGS: Gallbladder: Multiple echogenic stones with shadowing, individually up to 19 mm diameter. These appear mobile. However, there is mild gallbladder wall thickening up to 5 mm. No pericholecystic fluid identified. No sonographic Murphy sign elicited. Common  bile duct: Diameter: 6 mm, within normal limits Liver: Overall liver echogenicity is within normal limits. No discrete liver lesion, but suggestion of central intrahepatic biliary ductal dilatation in the right lobe (image 13). The left lobe appears spared. Other findings: There is a solid, vascular 7.2 cm mass in the lower pole of the right kidney (image 32 and 33). In all this measures 6.3 x 7.2 x 6.8 cm. The right upper pole appears normal. The hilum is partially obscured by this mass. No abdominal free fluid identified. IMPRESSION: 1. Incidentally discovered 7.2 cm vascular mass in the lower pole of the right kidney most compatible with Renal Cell Carcinoma. Recommend Urology consultation. 2. Cholelithiasis with mild gallbladder wall thickening with right intrahepatic biliary ductal enlargement. Despite absent sonographic Murphy sign, the appearance is highly suspicious for acute cholecystitis and/or acute biliary obstruction. Electronically Signed   By: Genevie Ann M.D.   On: 08/21/2015 16:40   I have personally reviewed and evaluated these images and lab results as part of my medical decision-making.   EKG Interpretation   Date/Time:  Saturday August 21 2015 14:35:05 EST Ventricular Rate:  69 PR Interval:  154 QRS Duration: 104 QT Interval:  417 QTC Calculation: 447 R Axis:   76 Text Interpretation:  Sinus rhythm Low voltage, precordial leads Confirmed  by Eola Waldrep MD, Corene Cornea [phone]) on 08/21/2015 3:24:28 PM      MDM   Final diagnoses:  Renal mass, right  Pancreatitis   Epigastric abdominal pain, ttp in epigastric and RUQ without masses, rebound or guarding. ddx for biliary colic, pancreatitis, gastric sleeve complication (done in July), aortic pathology. However, 2/2 location of symptoms and PE, will start with [place]. If it is negative, will consider CT scan to further evaluate.  Lipase came back >3000, [place] with cholelithiasis and gallbladder wall thickening concerning for possible gallstone  pancreatitis, will admit for GI evaluation and management.  Also with incidental renal cell mass, which will be worked while in the hospital as well.     Merrily Pew, MD 08/22/15 [phone]

## 2015-08-22 ENCOUNTER — Encounter (HOSPITAL_COMMUNITY): Payer: Self-pay | Admitting: Radiology

## 2015-08-22 ENCOUNTER — Inpatient Hospital Stay (HOSPITAL_COMMUNITY): Payer: BLUE CROSS/BLUE SHIELD

## 2015-08-22 DIAGNOSIS — N2889 Other specified disorders of kidney and ureter: Secondary | ICD-10-CM | POA: Insufficient documentation

## 2015-08-22 LAB — COMPREHENSIVE METABOLIC PANEL
ALBUMIN: 3.4 g/dL — AB (ref 3.5–5.0)
ALK PHOS: 71 U/L (ref 38–126)
ALT: 142 U/L — ABNORMAL HIGH (ref 14–54)
AST: 223 U/L — AB (ref 15–41)
Anion gap: 7 (ref 5–15)
BILIRUBIN TOTAL: 1.2 mg/dL (ref 0.3–1.2)
BUN: 8 mg/dL (ref 6–20)
CALCIUM: 8.6 mg/dL — AB (ref 8.9–10.3)
CO2: 26 mmol/L (ref 22–32)
CREATININE: 0.66 mg/dL (ref 0.44–1.00)
Chloride: 107 mmol/L (ref 101–111)
GFR calc Af Amer: >60 mL/min (ref >60)
GFR calc non Af Amer: >60 mL/min (ref >60)
GLUCOSE: 123 mg/dL — AB (ref 65–99)
Potassium: 3.6 mmol/L (ref 3.5–5.1)
Sodium: 140 mmol/L (ref 135–145)
TOTAL PROTEIN: 5.8 g/dL — AB (ref 6.5–8.1)

## 2015-08-22 LAB — CBC
HCT: 36.5 % (ref 36.0–46.0)
Hemoglobin: 12.3 g/dL (ref 12.0–15.0)
MCH: 28.5 pg (ref 26.0–34.0)
MCHC: 33.7 g/dL (ref 30.0–36.0)
MCV: 84.7 fL (ref 78.0–100.0)
PLATELETS: 193 10*3/uL (ref 150–400)
RBC: 4.31 MIL/uL (ref 3.87–5.11)
RDW: 13.1 % (ref 11.5–15.5)
WBC: 8.2 10*3/uL (ref 4.0–10.5)

## 2015-08-22 LAB — PREGNANCY, URINE: PREG TEST UR: NEGATIVE

## 2015-08-22 IMAGING — CR DG CHEST 2V
2 series · 2 of 2 positions shown · non-contrast
Comparison: None.

CLINICAL DATA: Pancreatitis.

EXAM:
CHEST  2 VIEW

[chest pa]
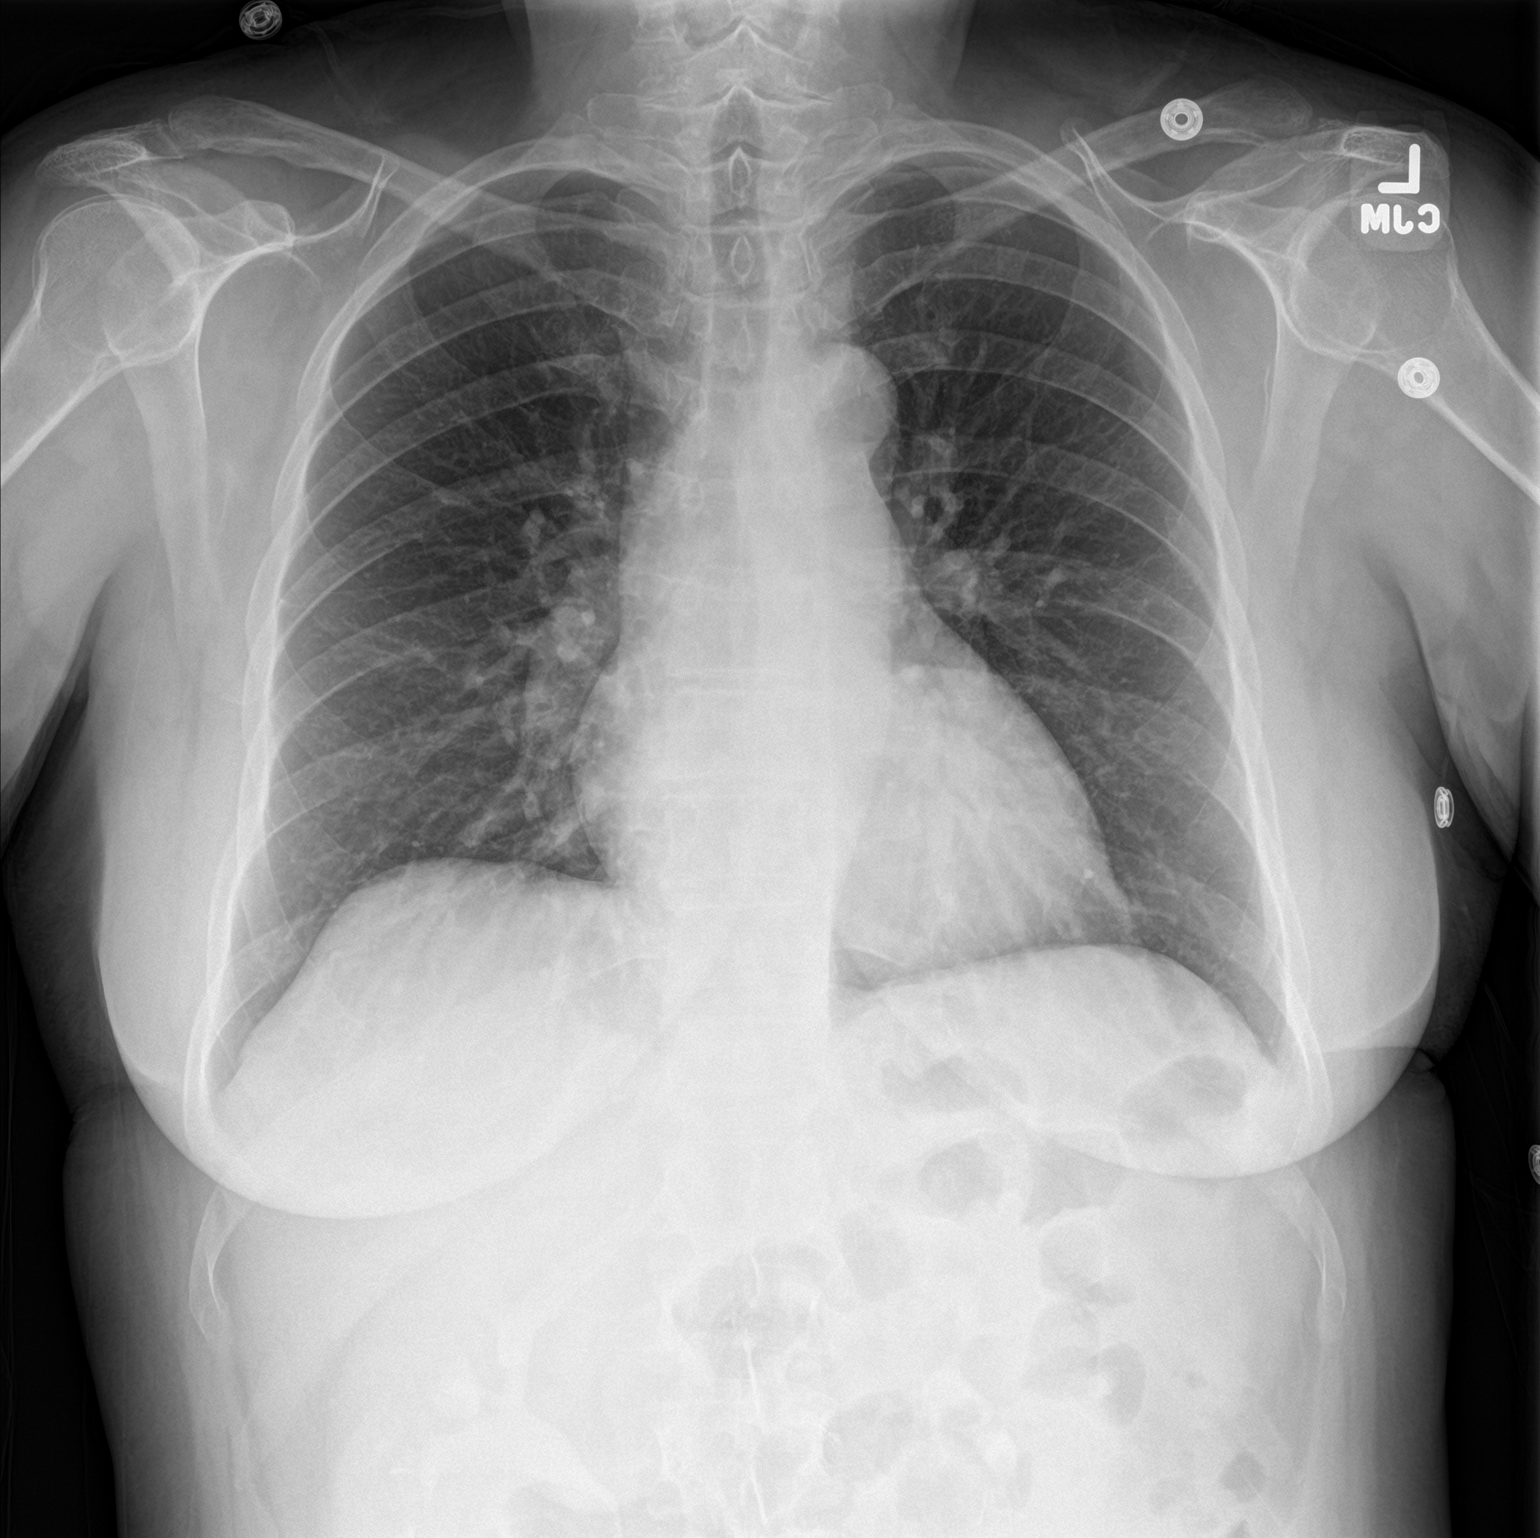

[chest lat]
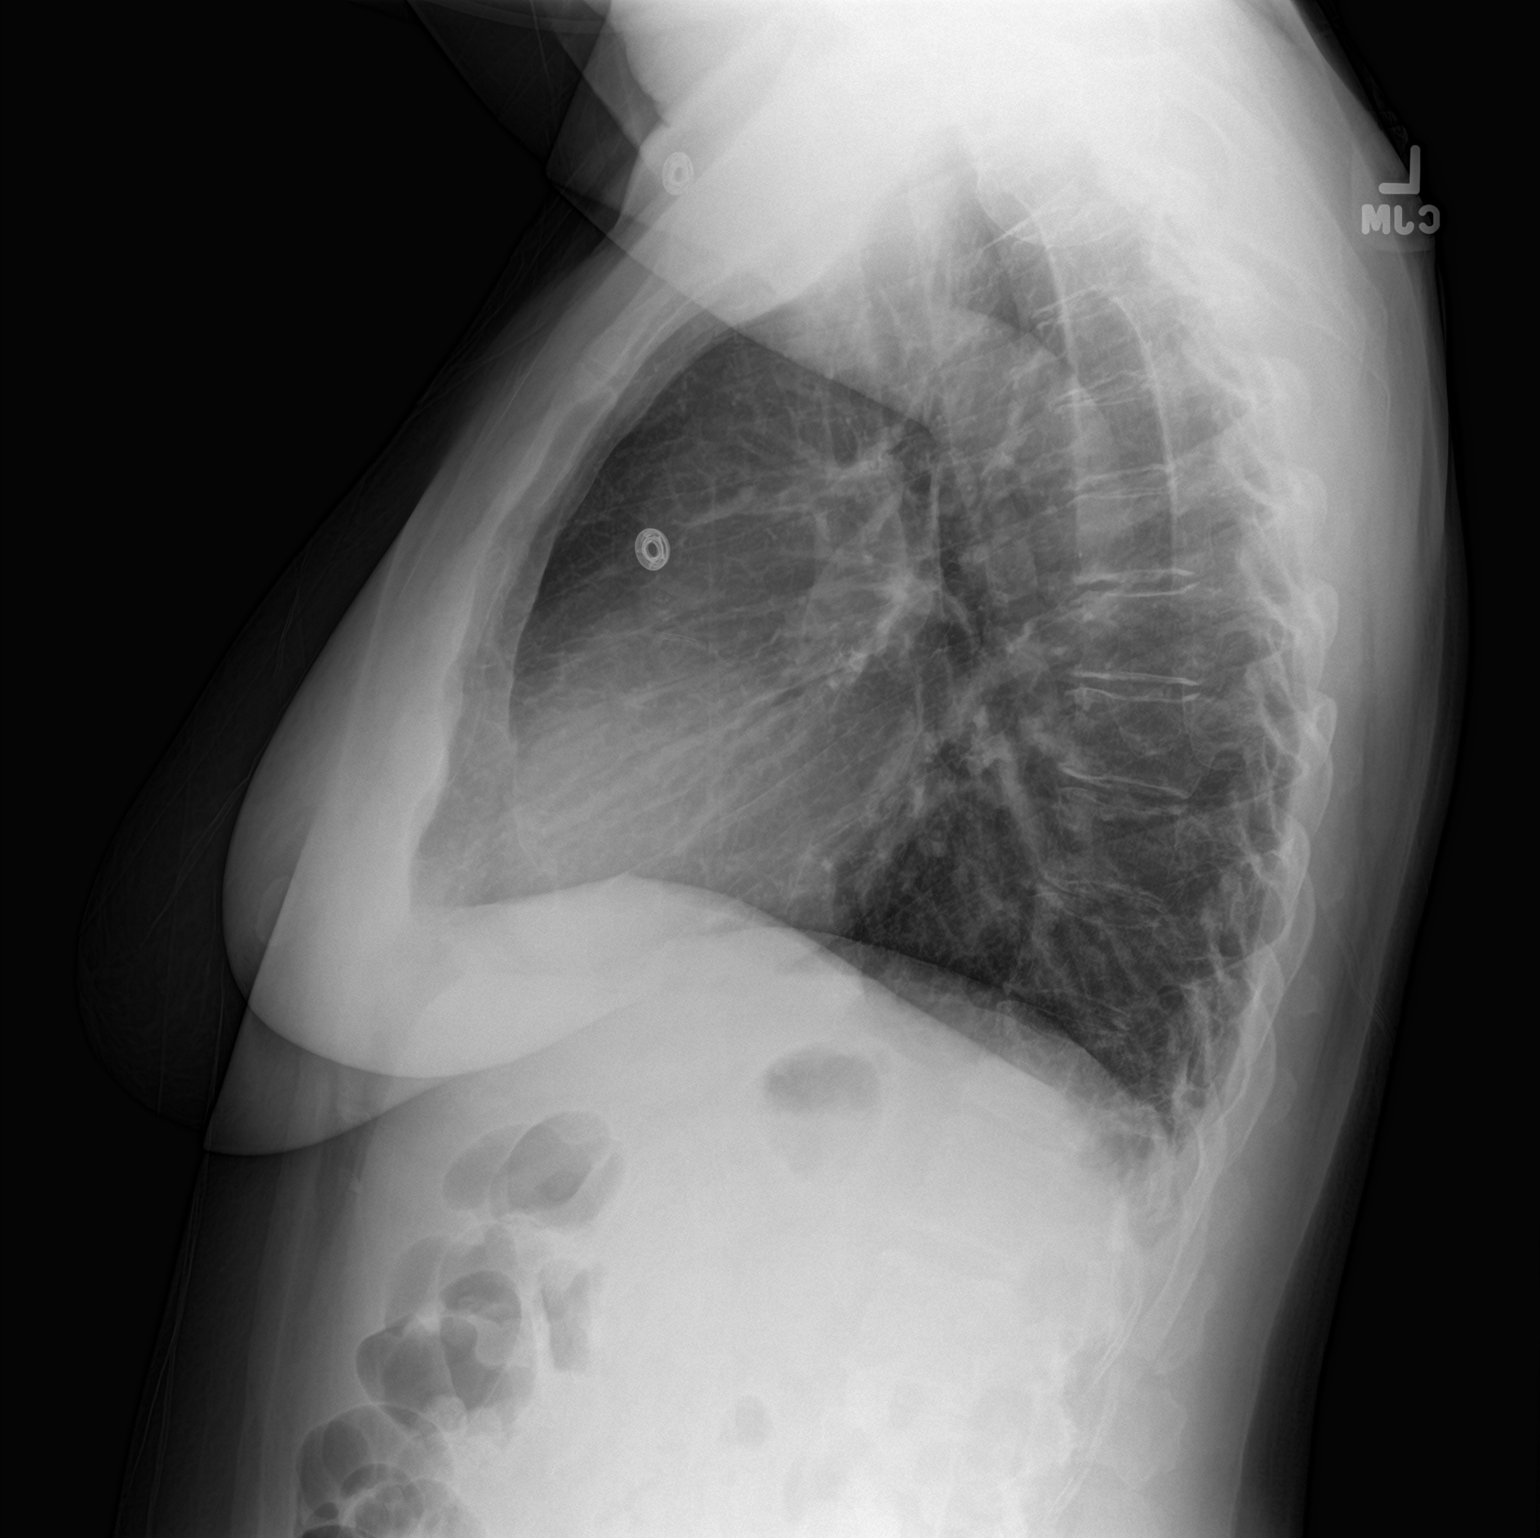

[2 of 2 positions shown; findings below may reference images not displayed]

FINDINGS: The cardiomediastinal contours are normal. Borderline
hyperinflation. Pulmonary vasculature is normal. No consolidation,
pleural effusion, or pneumothorax. No acute osseous abnormalities
are seen.
IMPRESSION: No acute pulmonary process.

## 2015-08-22 IMAGING — CT CT ABD-PEL WO/W CM
2 of 9 series · 12 of 46 positions shown, 15 images · IV contrast (Iodine)
Comparison: Sonography from yesterday

CLINICAL DATA: Renal cell carcinoma.  Gallstone pancreatitis.

EXAM:
CT ABDOMEN AND PELVIS WITHOUT AND WITH CONTRAST
TECHNIQUE: Multidetector CT imaging of the abdomen and pelvis was performed
following the standard protocol before and following the bolus
administration of intravenous contrast.
CONTRAST:  100mL OMNIPAQUE IOHEXOL 350 MG/ML SOLN

[Series 202: coronal wo, idose (2) · coronal · 0.45mm/px · 2 of 116 slices shown]
[im 39/116  soft-tissue]
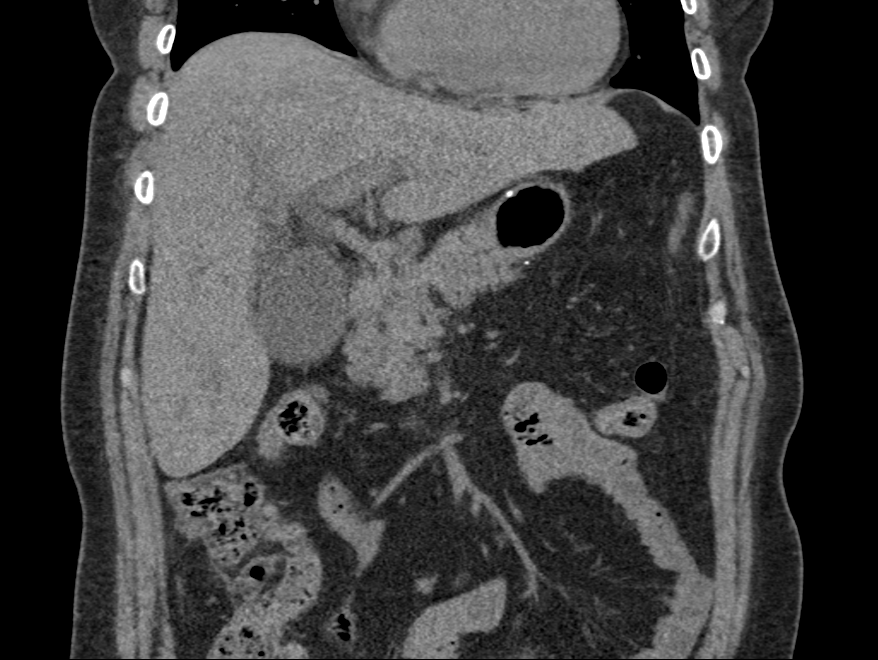
[im 77/116  soft-tissue]
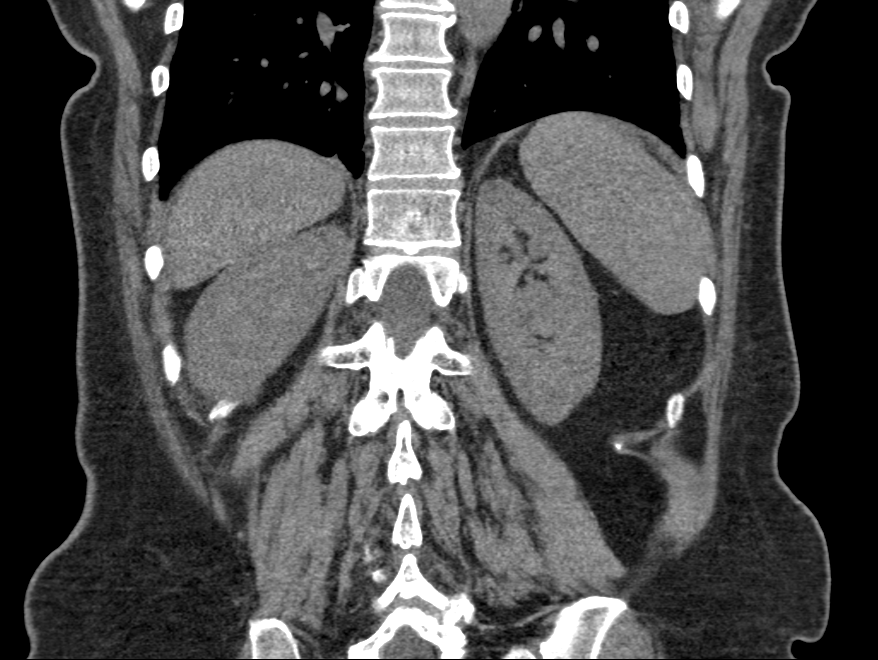

[Series 501: nephrographic, idose (1) · axial · 0.77mm/px · z∈[-155,+270]mm · 10 of 200 slices shown, 13 images]
[im 15/200  soft-tissue]
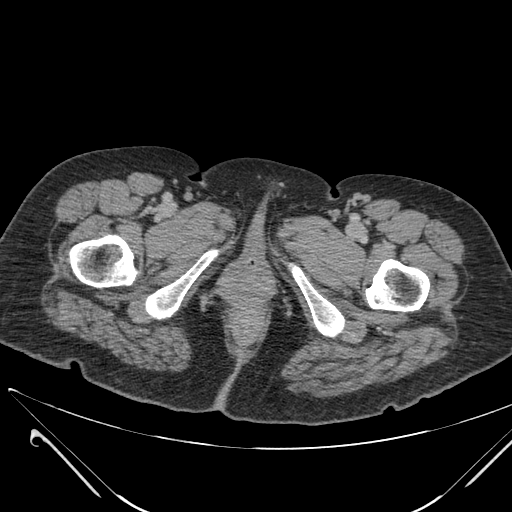
[im 15/200  bone]
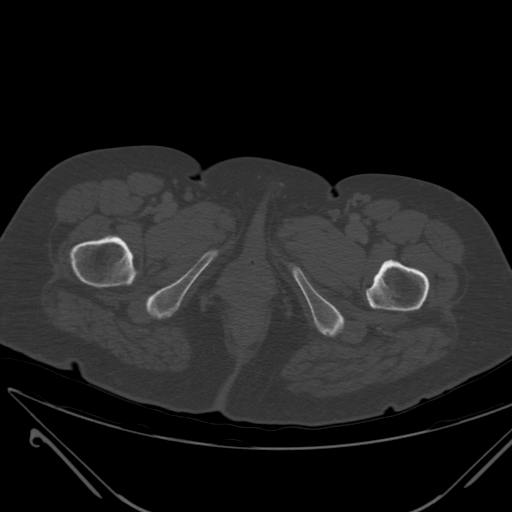
[im 43/200  soft-tissue]
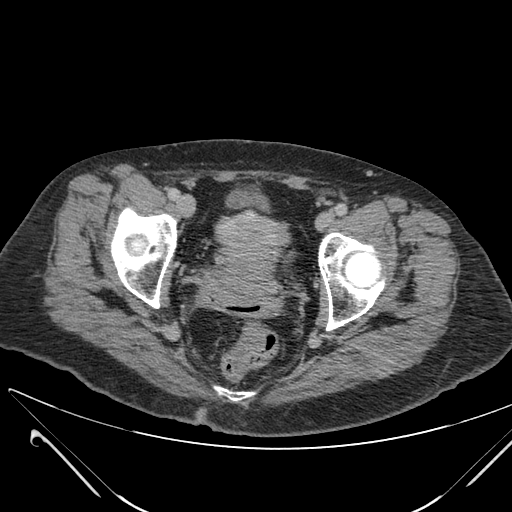
[im 72/200  soft-tissue]
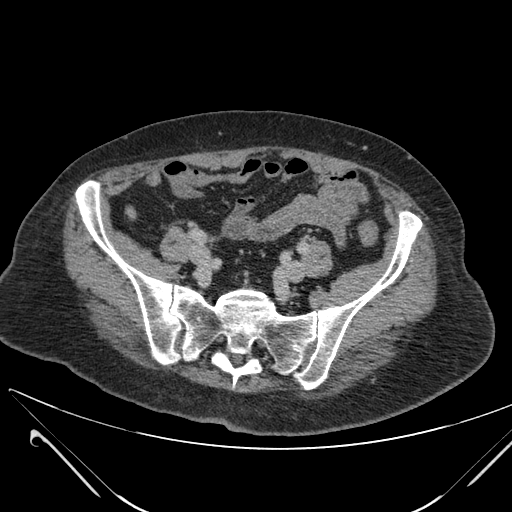
[im 86/200  soft-tissue]
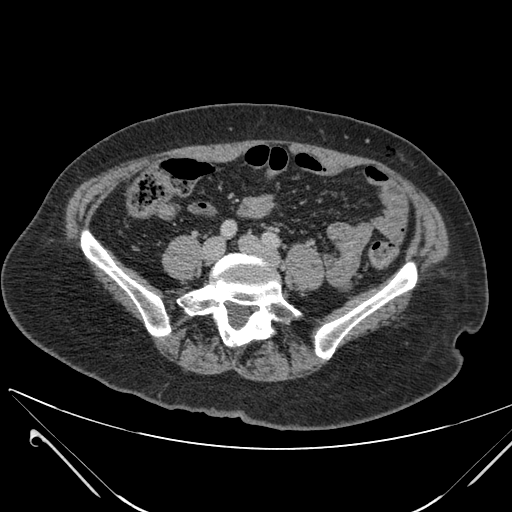
[im 114/200  soft-tissue]
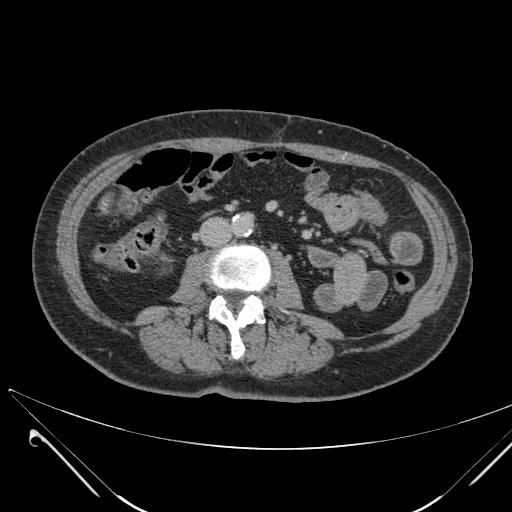
[im 128/200  soft-tissue]
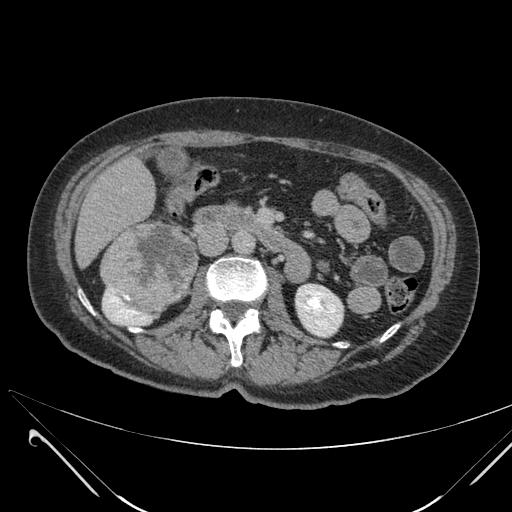
[im 143/200  lung]
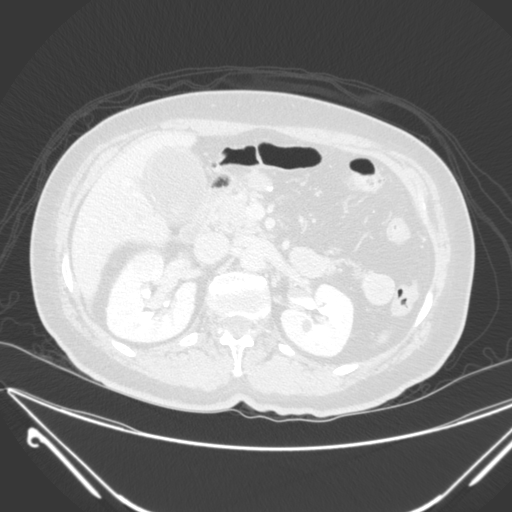
[im 157/200  soft-tissue]
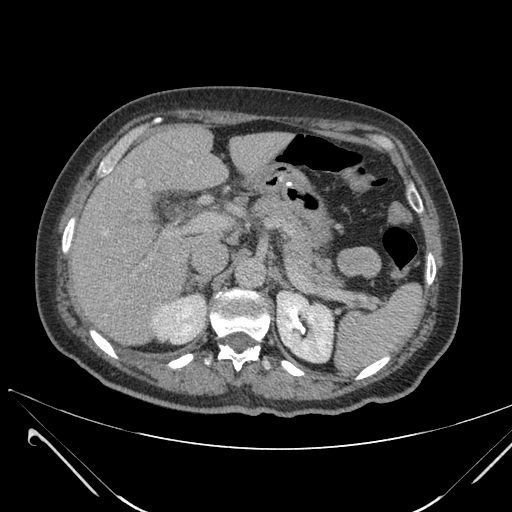
[im 157/200  lung]
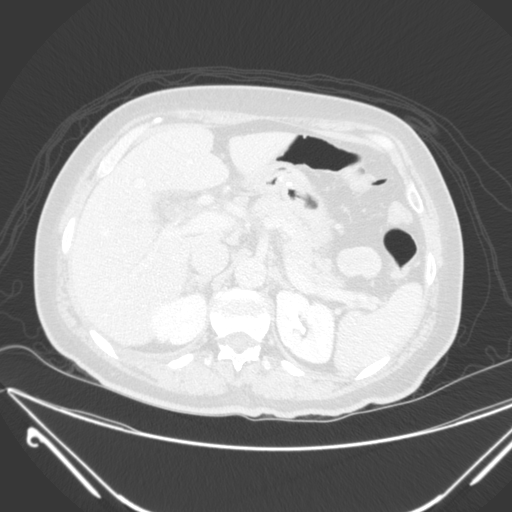
[im 171/200  lung]
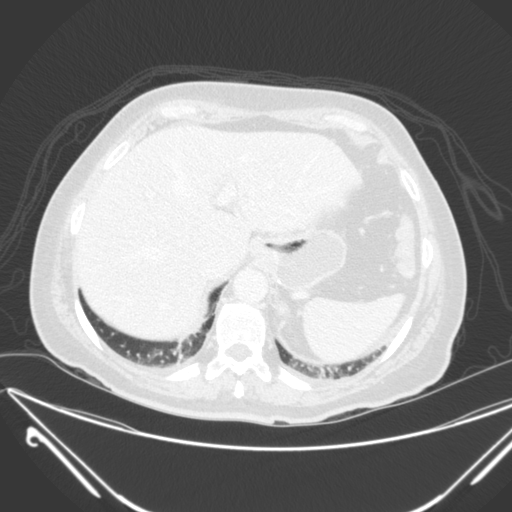
[im 185/200  soft-tissue]
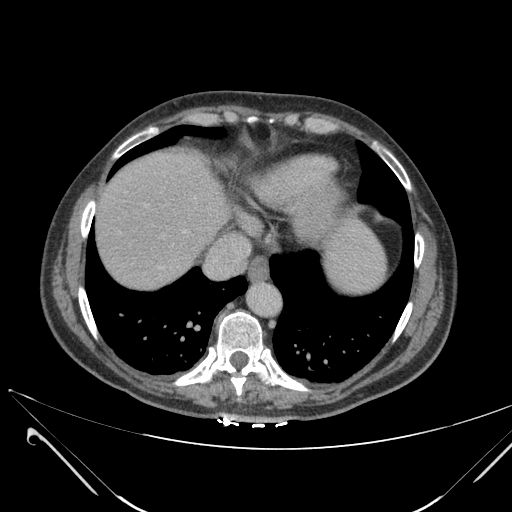
[im 185/200  lung]
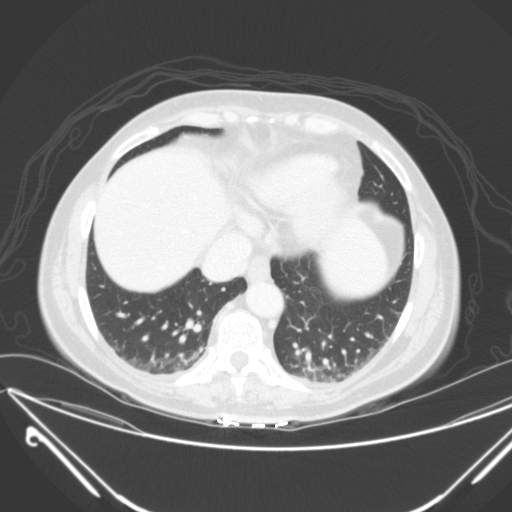

[12 of 46 positions shown; findings below may reference images not displayed]

FINDINGS: Lower chest and abdominal wall:  Fatty left lumbar hernia.

Hepatobiliary: Cholelithiasis with gallbladder wall thickening and
pericholecystic edema, correlating with the sonography findings. No
common bile duct enlargement or calcified choledocholithiasis.
Perfusion anomalies around the gallbladder fossa.

Pancreas: No evidence of metastasis.  No evidence of pancreatitis.

Spleen: Unremarkable.

Adrenals/Urinary Tract:  Negative adrenals.

Solid avidly enhancing 8 cm mass arising from the lower pole right
kidney consistent with renal cell carcinoma. Mass extends beyond the
margins of the cortex into the perinephric fat, but does not clearly
grow beyond Gerota's fascia. There is no visible renal vein invasion
or adenopathy. Single right renal artery. Two left renal arteries.
No notable left renal scarring. Other renal cortical low densities
appear cystic.

Reproductive:Negative.

Stomach/Bowel: No obstruction. No appendicitis. Mild colonic
diverticulosis. Gastric sleeve procedure.

Vascular/Lymphatic: No acute vascular abnormality. No mass or
adenopathy.

Peritoneal: Small pelvic fluid considered reactive.

Musculoskeletal: No acute abnormalities.
IMPRESSION: 1. CT findings of acute cholecystitis, as seen on preceding
sonography. No imaging correlate for patient's pancreatitis.
2. 8 cm right lower pole renal mass consistent with renal cell
carcinoma. There is perinephric fat invasion without renal vein
involvement or adenopathy.

## 2015-08-22 MED ORDER — PIPERACILLIN-TAZOBACTAM 3.375 G IVPB
3.3750 g | Freq: Three times a day (TID) | INTRAVENOUS | Status: DC
  Administered 2015-08-22 – 2015-08-23 (×3): 3.375 g via INTRAVENOUS
  Filled 2015-08-22 (×5): qty 50

## 2015-08-22 MED ORDER — KCL IN DEXTROSE-NACL 30-5-0.45 MEQ/L-%-% IV SOLN
INTRAVENOUS | Status: DC
  Administered 2015-08-22 – 2015-08-23 (×3): via INTRAVENOUS
  Filled 2015-08-22 (×4): qty 1000

## 2015-08-22 MED ORDER — IOHEXOL 350 MG/ML SOLN
100.0000 mL | Freq: Once | INTRAVENOUS | Status: AC | PRN
  Administered 2015-08-22: 100 mL via INTRAVENOUS

## 2015-08-22 NOTE — Progress Notes (Signed)
Pharmacy Antibiotic Note  Dana Hanson is a 55 y.o. female admitted on 08/21/2015 with gallstone pancreatitis concerning for cholecystitis.  Pharmacy has been consulted for Zosyn dosing.  Plan: Zosyn 3.375g IV q8h (4 hour infusion).  Monitor renal function, clinical progress     Temp (24hrs), Avg:98.1 F (36.7 C), Min:97.7 F (36.5 C), Max:98.7 F (37.1 C)   Recent Labs Lab 08/21/15 1445 08/22/15 0538  WBC 8.3 8.2  CREATININE 0.94 0.66    CrCl cannot be calculated (Unknown ideal weight.).    No Known Allergies  Antimicrobials this admission: Zosyn 2/5>>  Dose adjustments this admission: none  Microbiology results: none  Thank you for allowing pharmacy to be a part of this patient's care.  [place], Pharm.D., BCPS Clinical Pharmacist Pager: [phone] 08/22/2015 9:01 AM

## 2015-08-22 NOTE — Progress Notes (Signed)
Subjective: Having less abdominal pain this morning  Objective: Vital signs in last 24 hours: Temp:  [97.7 F (36.5 C)-98.7 F (37.1 C)] 98.7 F (37.1 C) (02/05 0522) Pulse Rate:  [56-83] 56 (02/05 0522) Resp:  [16-18] 18 (02/05 0522) BP: (99-145)/(50-84) 99/50 mmHg (02/05 0522) SpO2:  [97 %-100 %] 97 % (02/05 0522) Last BM Date: 08/20/15  Intake/Output from previous day: 02/04 0701 - 02/05 0700 In: 2651.3 [I.V.:2651.3] Out: -  Intake/Output this shift:    Abdomen soft, less tender in the epigastrium and RUQ  Lab Results:   Recent Labs  08/21/15 1445 08/22/15 0538  WBC 8.3 8.2  HGB 13.2 12.3  HCT 39.8 36.5  PLT 232 193   BMET  Recent Labs  08/21/15 1445 08/22/15 0538  NA 145 140  K 4.1 3.6  CL 107 107  CO2 27 26  GLUCOSE 106* 123*  BUN 14 8  CREATININE 0.94 0.66  CALCIUM 10.1 8.6*   PT/INR No results for input(s): LABPROT, INR in the last 72 hours. ABG No results for input(s): PHART, HCO3 in the last 72 hours.  Invalid input(s): PCO2, PO2  Studies/Results: Ct Abdomen Pelvis W Wo Contrast  08/22/2015  CLINICAL DATA:  Renal cell carcinoma.  Gallstone pancreatitis. EXAM: CT ABDOMEN AND PELVIS WITHOUT AND WITH CONTRAST TECHNIQUE: Multidetector CT imaging of the abdomen and pelvis was performed following the standard protocol before and following the bolus administration of intravenous contrast. CONTRAST:  142mL OMNIPAQUE IOHEXOL 350 MG/ML SOLN COMPARISON:  Sonography from yesterday FINDINGS: Lower chest and abdominal wall:  Fatty left lumbar hernia. Hepatobiliary: Cholelithiasis with gallbladder wall thickening and pericholecystic edema, correlating with the sonography findings. No common bile duct enlargement or calcified choledocholithiasis. Perfusion anomalies around the gallbladder fossa. Pancreas: No evidence of metastasis.  No evidence of pancreatitis. Spleen: Unremarkable. Adrenals/Urinary Tract:  Negative adrenals. Solid avidly enhancing 8 cm mass  arising from the lower pole right kidney consistent with renal cell carcinoma. Mass extends beyond the margins of the cortex into the perinephric fat, but does not clearly grow beyond Gerota's fascia. There is no visible renal vein invasion or adenopathy. Single right renal artery. Two left renal arteries. No notable left renal scarring. Other renal cortical low densities appear cystic. Reproductive:Negative. Stomach/Bowel: No obstruction. No appendicitis. Mild colonic diverticulosis. Gastric sleeve procedure. Vascular/Lymphatic: No acute vascular abnormality. No mass or adenopathy. Peritoneal: Small pelvic fluid considered reactive. Musculoskeletal: No acute abnormalities. IMPRESSION: 1. CT findings of acute cholecystitis, as seen on preceding sonography. No imaging correlate for patient's pancreatitis. 2. 8 cm right lower pole renal mass consistent with renal cell carcinoma. There is perinephric fat invasion without renal vein involvement or adenopathy. Electronically Signed   By: Monte Fantasia M.D.   On: 08/22/2015 03:22   Dg Chest 2 View  08/22/2015  CLINICAL DATA:  Pancreatitis. EXAM: CHEST  2 VIEW COMPARISON:  None. FINDINGS: The cardiomediastinal contours are normal. Borderline hyperinflation. Pulmonary vasculature is normal. No consolidation, pleural effusion, or pneumothorax. No acute osseous abnormalities are seen. IMPRESSION: No acute pulmonary process. Electronically Signed   By: Jeb Levering M.D.   On: 08/22/2015 03:12   US Abdomen Limited  08/21/2015  CLINICAL DATA:  55 year old female with acute right upper quadrant pain since 1330 hours today. Initial encounter. EXAM: US ABDOMEN LIMITED - RIGHT UPPER QUADRANT COMPARISON:  None. FINDINGS: Gallbladder: Multiple echogenic stones with shadowing, individually up to 19 mm diameter. These appear mobile. However, there is mild gallbladder wall thickening up to 5 mm. No  pericholecystic fluid identified. No sonographic Murphy sign elicited. Common  bile duct: Diameter: 6 mm, within normal limits Liver: Overall liver echogenicity is within normal limits. No discrete liver lesion, but suggestion of central intrahepatic biliary ductal dilatation in the right lobe (image 13). The left lobe appears spared. Other findings: There is a solid, vascular 7.2 cm mass in the lower pole of the right kidney (image 32 and 33). In all this measures 6.3 x 7.2 x 6.8 cm. The right upper pole appears normal. The hilum is partially obscured by this mass. No abdominal free fluid identified. IMPRESSION: 1. Incidentally discovered 7.2 cm vascular mass in the lower pole of the right kidney most compatible with Renal Cell Carcinoma. Recommend Urology consultation. 2. Cholelithiasis with mild gallbladder wall thickening with right intrahepatic biliary ductal enlargement. Despite absent sonographic Murphy sign, the appearance is highly suspicious for acute cholecystitis and/or acute biliary obstruction. Electronically Signed   By: Genevie Ann M.D.   On: 08/21/2015 16:40    Anti-infectives: Anti-infectives    None      Assessment/Plan:  Gallstone pancreatitis  Give CT being worrisome for cholecystitis, will start antibiotics.  LFT's up slightly.  I think she will need a laparoscopic cholecystectomy this admission with Urology taking care of the renal mass at a later time.  LOS: 1 day    Hal Norrington A 08/22/2015

## 2015-08-22 NOTE — Progress Notes (Signed)
I was paged about this patient last night, regarding concerning findings of a renal mass on a right upper quadrant ultrasound.  I suggested that she have a CT scan with/without contrast and a CXR.  I reviewed the images, she has a large upper pole renal mass with enhancement that may invade into geroda's fascia but does not appear to extend into the renal vein.  Her CXR was normal. Her creatinine is normal.  The contralateral kidney is normal.  She needs a laparoscopic radical nephrectomy.  This can be done in the next several weeks.  Certainly willing to do a combination case with Gen Surg if we can coordinate it.  Agree with waiting until her pancreatitis settles down.  I have scheduled the patient to be seen in our office on Tuesday afternoon if she has been discharged from the hospital by then.

## 2015-08-22 NOTE — Progress Notes (Signed)
Family Medicine Teaching Service Daily Progress Note Intern Pager: [phone]  Patient name: Dana Hanson Medical record number: [id number] Date of birth: 26-Apr-1961 Age: 55 y.o. Gender: female  Primary Care Provider: [name], MD Consultants: Surgery and Urology Code Status: full  Pt Overview and Major Events to Date:  2/4: Pancreatitis 2/2 gallstone; Incidental Renal Mass found - likely RCC 2/5: Pain resolved. Liquids today but NPO after MN - possible surgery tomorrow  Assessment and Plan: Dana Hanson is a 55 y.o. female presenting with RUQ and epigastric tenderness. PMH is significant for HLD,Osteoarthitis, hx of hemorrhoids   #Pancretitis: Likely 2/2 gallstone. Sudden onset of 10/10 epigastric and right upper quadrant pain, patient with recent history of gastric sleeve within the last 6 months increasing likelihood of cholelithiasis for this patient. On admission, Lipase > 3000. Likely patient with pancreatitis due to bilary obstruction as RUQ u/s significant for cholilethasis with mild gallbladder wall thickening. CBC wnl, bilirubin and alkaline phosphatase wnl, no fever or malaise inidicating this likely a less severe presentation of biliary obstruction. From clinical presentation, pain most likely due to mild/moderate pancreatitis.  - Admit to [facility], attending Dr. Erin Hearing - Surgery recs: Liquids today surgery on 2/6 or 2/7  Zosyn per surgery for possible cholecystitis: 2/5 >> - IVF as below - Dilaudid for pain - Zofran for nausea.   #Incidental Renal Mass: Ultrasound reading consistent with possible renal cell carcinoma  - Spoke with urology:   scheduled the patient to be seen in our office on Tuesday afternoon if she has been discharged from the hospital by then.   Consider radical nephrectomy at time of gallbladder removal - CXR: Negative - CT ab/pelvis w and w/out contrast: 8 cm right lower pole renal mass consistent with renal cell carcinoma.     #History of gastric sleeve bypass: has lost 68 lbs since July 2016.   FEN/GI: Liquids; D5 1/2NS +30K @ 100 Prophylaxis: Lovenox   Disposition: Med-surg   Subjective:  Doing well. No complaints  Objective: Temp:  [97.7 F (36.5 C)-98.7 F (37.1 C)] 98.7 F (37.1 C) (02/05 0522) Pulse Rate:  [56-83] 56 (02/05 0522) Resp:  [16-18] 18 (02/05 0522) BP: (99-145)/(50-84) 99/50 mmHg (02/05 0522) SpO2:  [97 %-100 %] 97 % (02/05 0522) Physical Exam: General: Lying in bed, NAD  ENTM: Mucosa membranes moist Cardiovascular:Regular rate and rhythm. No murmurs, gallops or rubs.  Respiratory: CTAB, no wheezes, or rhonchi  Abdomen: BS+,Mild tenderness in RUQ MSK: No lower extremity edema   Laboratory:  Recent Labs Lab 08/21/15 1445 08/22/15 0538  WBC 8.3 8.2  HGB 13.2 12.3  HCT 39.8 36.5  PLT 232 193    Recent Labs Lab 08/21/15 1445 08/22/15 0538  NA 145 140  K 4.1 3.6  CL 107 107  CO2 27 26  BUN 14 8  CREATININE 0.94 0.66  CALCIUM 10.1 8.6*  PROT 6.6 5.8*  BILITOT 1.0 1.2  ALKPHOS 50 71  ALT 12* 142*  AST 27 223*  GLUCOSE 106* 123*   Troponin (Point of Care Test)  Recent Labs  08/21/15 1453  TROPIPOC 0.00    Imaging/Diagnostic Tests: Ct Abdomen Pelvis W Wo Contrast  08/22/2015  CLINICAL DATA:  Renal cell carcinoma.  Gallstone pancreatitis. EXAM: CT ABDOMEN AND PELVIS WITHOUT AND WITH CONTRAST TECHNIQUE: Multidetector CT imaging of the abdomen and pelvis was performed following the standard protocol before and following the bolus administration of intravenous contrast. CONTRAST:  155mL OMNIPAQUE IOHEXOL 350 MG/ML SOLN COMPARISON:  Sonography from  yesterday FINDINGS: Lower chest and abdominal wall:  Fatty left lumbar hernia. Hepatobiliary: Cholelithiasis with gallbladder wall thickening and pericholecystic edema, correlating with the sonography findings. No common bile duct enlargement or calcified choledocholithiasis. Perfusion anomalies around the  gallbladder fossa. Pancreas: No evidence of metastasis.  No evidence of pancreatitis. Spleen: Unremarkable. Adrenals/Urinary Tract:  Negative adrenals. Solid avidly enhancing 8 cm mass arising from the lower pole right kidney consistent with renal cell carcinoma. Mass extends beyond the margins of the cortex into the perinephric fat, but does not clearly grow beyond Gerota's fascia. There is no visible renal vein invasion or adenopathy. Single right renal artery. Two left renal arteries. No notable left renal scarring. Other renal cortical low densities appear cystic. Reproductive:Negative. Stomach/Bowel: No obstruction. No appendicitis. Mild colonic diverticulosis. Gastric sleeve procedure. Vascular/Lymphatic: No acute vascular abnormality. No mass or adenopathy. Peritoneal: Small pelvic fluid considered reactive. Musculoskeletal: No acute abnormalities. IMPRESSION: 1. CT findings of acute cholecystitis, as seen on preceding sonography. No imaging correlate for patient's pancreatitis. 2. 8 cm right lower pole renal mass consistent with renal cell carcinoma. There is perinephric fat invasion without renal vein involvement or adenopathy. Electronically Signed   By: Monte Fantasia M.D.   On: 08/22/2015 03:22   Dg Chest 2 View  08/22/2015  CLINICAL DATA:  Pancreatitis. EXAM: CHEST  2 VIEW COMPARISON:  None. FINDINGS: The cardiomediastinal contours are normal. Borderline hyperinflation. Pulmonary vasculature is normal. No consolidation, pleural effusion, or pneumothorax. No acute osseous abnormalities are seen. IMPRESSION: No acute pulmonary process. Electronically Signed   By: Jeb Levering M.D.   On: 08/22/2015 03:12   US Abdomen Limited  08/21/2015  CLINICAL DATA:  55 year old female with acute right upper quadrant pain since 1330 hours today. Initial encounter. EXAM: US ABDOMEN LIMITED - RIGHT UPPER QUADRANT COMPARISON:  None. FINDINGS: Gallbladder: Multiple echogenic stones with shadowing, individually up  to 19 mm diameter. These appear mobile. However, there is mild gallbladder wall thickening up to 5 mm. No pericholecystic fluid identified. No sonographic Murphy sign elicited. Common bile duct: Diameter: 6 mm, within normal limits Liver: Overall liver echogenicity is within normal limits. No discrete liver lesion, but suggestion of central intrahepatic biliary ductal dilatation in the right lobe (image 13). The left lobe appears spared. Other findings: There is a solid, vascular 7.2 cm mass in the lower pole of the right kidney (image 32 and 33). In all this measures 6.3 x 7.2 x 6.8 cm. The right upper pole appears normal. The hilum is partially obscured by this mass. No abdominal free fluid identified. IMPRESSION: 1. Incidentally discovered 7.2 cm vascular mass in the lower pole of the right kidney most compatible with Renal Cell Carcinoma. Recommend Urology consultation. 2. Cholelithiasis with mild gallbladder wall thickening with right intrahepatic biliary ductal enlargement. Despite absent sonographic Murphy sign, the appearance is highly suspicious for acute cholecystitis and/or acute biliary obstruction. Electronically Signed   By: Genevie Ann M.D.   On: 08/21/2015 16:40    Olam Idler, MD 08/22/2015, 12:28 PM PGY-3, [place] Intern pager: [phone], text pages welcome

## 2015-08-23 DIAGNOSIS — N2889 Other specified disorders of kidney and ureter: Secondary | ICD-10-CM

## 2015-08-23 LAB — COMPREHENSIVE METABOLIC PANEL
ALBUMIN: 3.3 g/dL — AB (ref 3.5–5.0)
ALT: 104 U/L — ABNORMAL HIGH (ref 14–54)
ANION GAP: 8 (ref 5–15)
AST: 90 U/L — ABNORMAL HIGH (ref 15–41)
Alkaline Phosphatase: 65 U/L (ref 38–126)
BILIRUBIN TOTAL: 1.5 mg/dL — AB (ref 0.3–1.2)
BUN: <5 mg/dL — ABNORMAL LOW (ref 6–20)
CO2: 27 mmol/L (ref 22–32)
Calcium: 9.2 mg/dL (ref 8.9–10.3)
Chloride: 108 mmol/L (ref 101–111)
Creatinine, Ser: 0.75 mg/dL (ref 0.44–1.00)
GFR calc non Af Amer: >60 mL/min (ref >60)
GLUCOSE: 104 mg/dL — AB (ref 65–99)
POTASSIUM: 4.2 mmol/L (ref 3.5–5.1)
Sodium: 143 mmol/L (ref 135–145)
TOTAL PROTEIN: 5.7 g/dL — AB (ref 6.5–8.1)

## 2015-08-23 LAB — LIPASE, BLOOD: LIPASE: 138 U/L — AB (ref 11–51)

## 2015-08-23 NOTE — Progress Notes (Signed)
Family Medicine Teaching Service Daily Progress Note Intern Pager: [phone]  Patient name: Dana Hanson Medical record number: [id number] Date of birth: 11-Mar-1961 Age: 55 y.o. Gender: female  Primary Care Provider: [name], MD Consultants: Surgery and Urology Code Status: full  Pt Overview and Major Events to Date:  2/4: Pancreatitis 2/2 gallstone; Incidental Renal Mass found - likely RCC 2/5: Pain resolved. Liquids today but NPO after MN - possible surgery tomorrow  Assessment and Plan: Dana Hanson is a 55 y.o. female presenting with RUQ and epigastric tenderness. PMH is significant for HLD,Osteoarthitis, hx of hemorrhoids   #Pancretitis: Likely 2/2 gallstone. Sudden onset of 10/10 epigastric and right upper quadrant pain, patient with recent history of gastric sleeve within the last 6 months increasing likelihood of cholelithiasis for this patient. On admission, Lipase > 3000. Likely patient with pancreatitis due to bilary obstruction as RUQ u/s significant for cholilethasis with mild gallbladder wall thickening. CBC wnl, bilirubin and alkaline phosphatase wnl, no fever or malaise inidicating this likely a less severe presentation of biliary obstruction. From clinical presentation, pain most likely due to mild/moderate pancreatitis.  - Labs: Lipase 3000 > 138. AST/ALT trending down 223/142> 90/104  - will stop Zosyn as likely outpatient procedure for combined cholestectomy and radical nephrectomy per surgical note  - Zofran for nausea.  - Start clears, advance to softs after discussing with surgery   #Incidental Renal Mass: Ultrasound reading consistent with possible renal cell carcinoma  - General surgery to contact urology and coordinate  - CXR: Negative - CT ab/pelvis w and w/out contrast: 8 cm right lower pole renal mass consistent with renal cell carcinoma.    #History of gastric sleeve bypass: has lost 68 lbs since July 2016.   FEN/GI: Liquids;  Prophylaxis:  Lovenox   Disposition: Med-surg   Subjective:  Patient doing well this AM, indicates that she no longer has any pain. Denies nausea, vomiting, fever, chills, chest pain,   Objective: Temp:  [98 F (36.7 C)-98.2 F (36.8 C)] 98.2 F (36.8 C) (02/06 0702) Pulse Rate:  [56-60] 60 (02/06 0702) Resp:  [18-19] 18 (02/06 0702) BP: (114-126)/(52-77) 126/77 mmHg (02/06 0702) SpO2:  [96 %-97 %] 96 % (02/06 0702) Physical Exam: General: Lying in bed, NAD  ENTM: Mucosa membranes moist Cardiovascular:Regular rate and rhythm. No murmurs, gallops or rubs.  Respiratory: CTAB, no wheezes, or rhonchi  Abdomen: BS+, no ttp, no rebound,  MSK: No lower extremity edema   Laboratory:  Recent Labs Lab 08/21/15 1445 08/22/15 0538  WBC 8.3 8.2  HGB 13.2 12.3  HCT 39.8 36.5  PLT 232 193    Recent Labs Lab 08/21/15 1445 08/22/15 0538 08/23/15 0644  NA 145 140 143  K 4.1 3.6 4.2  CL 107 107 108  CO2 27 26 27   BUN 14 8 <5*  CREATININE 0.94 0.66 0.75  CALCIUM 10.1 8.6* 9.2  PROT 6.6 5.8* 5.7*  BILITOT 1.0 1.2 1.5*  ALKPHOS 50 71 65  ALT 12* 142* 104*  AST 27 223* 90*  GLUCOSE 106* 123* 104*   Troponin (Point of Care Test)  Recent Labs  08/21/15 1453  TROPIPOC 0.00    Imaging/Diagnostic Tests: Ct Abdomen Pelvis W Wo Contrast  08/22/2015  CLINICAL DATA:  Renal cell carcinoma.  Gallstone pancreatitis. EXAM: CT ABDOMEN AND PELVIS WITHOUT AND WITH CONTRAST TECHNIQUE: Multidetector CT imaging of the abdomen and pelvis was performed following the standard protocol before and following the bolus administration of intravenous contrast. CONTRAST:  127mL OMNIPAQUE  IOHEXOL 350 MG/ML SOLN COMPARISON:  Sonography from yesterday FINDINGS: Lower chest and abdominal wall:  Fatty left lumbar hernia. Hepatobiliary: Cholelithiasis with gallbladder wall thickening and pericholecystic edema, correlating with the sonography findings. No common bile duct enlargement or calcified choledocholithiasis.  Perfusion anomalies around the gallbladder fossa. Pancreas: No evidence of metastasis.  No evidence of pancreatitis. Spleen: Unremarkable. Adrenals/Urinary Tract:  Negative adrenals. Solid avidly enhancing 8 cm mass arising from the lower pole right kidney consistent with renal cell carcinoma. Mass extends beyond the margins of the cortex into the perinephric fat, but does not clearly grow beyond Gerota's fascia. There is no visible renal vein invasion or adenopathy. Single right renal artery. Two left renal arteries. No notable left renal scarring. Other renal cortical low densities appear cystic. Reproductive:Negative. Stomach/Bowel: No obstruction. No appendicitis. Mild colonic diverticulosis. Gastric sleeve procedure. Vascular/Lymphatic: No acute vascular abnormality. No mass or adenopathy. Peritoneal: Small pelvic fluid considered reactive. Musculoskeletal: No acute abnormalities. IMPRESSION: 1. CT findings of acute cholecystitis, as seen on preceding sonography. No imaging correlate for patient's pancreatitis. 2. 8 cm right lower pole renal mass consistent with renal cell carcinoma. There is perinephric fat invasion without renal vein involvement or adenopathy. Electronically Signed   By: Monte Fantasia M.D.   On: 08/22/2015 03:22   Dg Chest 2 View  08/22/2015  CLINICAL DATA:  Pancreatitis. EXAM: CHEST  2 VIEW COMPARISON:  None. FINDINGS: The cardiomediastinal contours are normal. Borderline hyperinflation. Pulmonary vasculature is normal. No consolidation, pleural effusion, or pneumothorax. No acute osseous abnormalities are seen. IMPRESSION: No acute pulmonary process. Electronically Signed   By: Jeb Levering M.D.   On: 08/22/2015 03:12   US Abdomen Limited  08/21/2015  CLINICAL DATA:  55 year old female with acute right upper quadrant pain since 1330 hours today. Initial encounter. EXAM: US ABDOMEN LIMITED - RIGHT UPPER QUADRANT COMPARISON:  None. FINDINGS: Gallbladder: Multiple echogenic stones  with shadowing, individually up to 19 mm diameter. These appear mobile. However, there is mild gallbladder wall thickening up to 5 mm. No pericholecystic fluid identified. No sonographic Murphy sign elicited. Common bile duct: Diameter: 6 mm, within normal limits Liver: Overall liver echogenicity is within normal limits. No discrete liver lesion, but suggestion of central intrahepatic biliary ductal dilatation in the right lobe (image 13). The left lobe appears spared. Other findings: There is a solid, vascular 7.2 cm mass in the lower pole of the right kidney (image 32 and 33). In all this measures 6.3 x 7.2 x 6.8 cm. The right upper pole appears normal. The hilum is partially obscured by this mass. No abdominal free fluid identified. IMPRESSION: 1. Incidentally discovered 7.2 cm vascular mass in the lower pole of the right kidney most compatible with Renal Cell Carcinoma. Recommend Urology consultation. 2. Cholelithiasis with mild gallbladder wall thickening with right intrahepatic biliary ductal enlargement. Despite absent sonographic Murphy sign, the appearance is highly suspicious for acute cholecystitis and/or acute biliary obstruction. Electronically Signed   By: Genevie Ann M.D.   On: 08/21/2015 16:40    Jaina Morin Cletis Media, MD 08/23/2015, 11:25 AM PGY-3, [place] Intern pager: [phone], text pages welcome

## 2015-08-23 NOTE — Progress Notes (Signed)
Family Medicine Teaching Service Daily Progress Note Intern Pager: [phone]  Patient name: Dana Hanson Medical record number: [id number] Date of birth: November 12, 1960 Age: 55 y.o. Gender: female  Primary Care Provider: [name], MD Consultants: Surgery and Urology Code Status: full  Pt Overview and Major Events to Date:  2/4: Pancreatitis 2/2 gallstone; Incidental Renal Mass found - likely RCC 2/5: Pain resolved. Liquids today but NPO after MN - possible surgery tomorrow  Assessment and Plan: Dana Hanson is a 55 y.o. female presenting with RUQ and epigastric tenderness. PMH is significant for HLD,Osteoarthitis, hx of hemorrhoids   #Pancretitis: Likely 2/2 gallstone. Sudden onset of 10/10 epigastric and right upper quadrant pain, patient with recent history of gastric sleeve within the last 6 months increasing likelihood of cholelithiasis for this patient. On admission, Lipase > 3000. Likely patient with pancreatitis due to bilary obstruction as RUQ u/s significant for cholilethasis with mild gallbladder wall thickening. CBC wnl, bilirubin and alkaline phosphatase wnl, no fever or malaise inidicating this likely a less severe presentation of biliary obstruction. From clinical presentation, pain most likely due to mild/moderate pancreatitis.  - Labs: Lipase 3000 > 138. AST/ALT trending down 223/142> 90/104  - will stop Zosyn as likely outpatient procedure for combined cholestectomy and radical nephrectomy per surgical note  - Dextrose 1/2 NS @ 100 ml per hr  - Dilaudid for pain - Zofran for nausea.   #Incidental Renal Mass: Ultrasound reading consistent with possible renal cell carcinoma  - General surgery to contact urology and coordinate  - CXR: Negative - CT ab/pelvis w and w/out contrast: 8 cm right lower pole renal mass consistent with renal cell carcinoma.    #History of gastric sleeve bypass: has lost 68 lbs since July 2016.   FEN/GI: Liquids; D5 1/2NS +30K @  100 Prophylaxis: Lovenox   Disposition: Med-surg   Subjective:  Patient doing well this AM, indicates that she no longer has any pain. Denies nausea, vomiting, fever, chills, chest pain,   Objective: Temp:  [98 F (36.7 C)-98.2 F (36.8 C)] 98.2 F (36.8 C) (02/06 0702) Pulse Rate:  [56-60] 60 (02/06 0702) Resp:  [18-19] 18 (02/06 0702) BP: (114-126)/(52-77) 126/77 mmHg (02/06 0702) SpO2:  [96 %-97 %] 96 % (02/06 0702) Physical Exam: General: Lying in bed, NAD  ENTM: Mucosa membranes moist Cardiovascular:Regular rate and rhythm. No murmurs, gallops or rubs.  Respiratory: CTAB, no wheezes, or rhonchi  Abdomen: BS+, no ttp, no rebound,  MSK: No lower extremity edema   Laboratory:  Recent Labs Lab 08/21/15 1445 08/22/15 0538  WBC 8.3 8.2  HGB 13.2 12.3  HCT 39.8 36.5  PLT 232 193    Recent Labs Lab 08/21/15 1445 08/22/15 0538  NA 145 140  K 4.1 3.6  CL 107 107  CO2 27 26  BUN 14 8  CREATININE 0.94 0.66  CALCIUM 10.1 8.6*  PROT 6.6 5.8*  BILITOT 1.0 1.2  ALKPHOS 50 71  ALT 12* 142*  AST 27 223*  GLUCOSE 106* 123*   Troponin (Point of Care Test)  Recent Labs  08/21/15 1453  TROPIPOC 0.00    Imaging/Diagnostic Tests: Ct Abdomen Pelvis W Wo Contrast  08/22/2015  CLINICAL DATA:  Renal cell carcinoma.  Gallstone pancreatitis. EXAM: CT ABDOMEN AND PELVIS WITHOUT AND WITH CONTRAST TECHNIQUE: Multidetector CT imaging of the abdomen and pelvis was performed following the standard protocol before and following the bolus administration of intravenous contrast. CONTRAST:  136mL OMNIPAQUE IOHEXOL 350 MG/ML SOLN COMPARISON:  Sonography from  yesterday FINDINGS: Lower chest and abdominal wall:  Fatty left lumbar hernia. Hepatobiliary: Cholelithiasis with gallbladder wall thickening and pericholecystic edema, correlating with the sonography findings. No common bile duct enlargement or calcified choledocholithiasis. Perfusion anomalies around the gallbladder fossa.  Pancreas: No evidence of metastasis.  No evidence of pancreatitis. Spleen: Unremarkable. Adrenals/Urinary Tract:  Negative adrenals. Solid avidly enhancing 8 cm mass arising from the lower pole right kidney consistent with renal cell carcinoma. Mass extends beyond the margins of the cortex into the perinephric fat, but does not clearly grow beyond Gerota's fascia. There is no visible renal vein invasion or adenopathy. Single right renal artery. Two left renal arteries. No notable left renal scarring. Other renal cortical low densities appear cystic. Reproductive:Negative. Stomach/Bowel: No obstruction. No appendicitis. Mild colonic diverticulosis. Gastric sleeve procedure. Vascular/Lymphatic: No acute vascular abnormality. No mass or adenopathy. Peritoneal: Small pelvic fluid considered reactive. Musculoskeletal: No acute abnormalities. IMPRESSION: 1. CT findings of acute cholecystitis, as seen on preceding sonography. No imaging correlate for patient's pancreatitis. 2. 8 cm right lower pole renal mass consistent with renal cell carcinoma. There is perinephric fat invasion without renal vein involvement or adenopathy. Electronically Signed   By: Monte Fantasia M.D.   On: 08/22/2015 03:22   Dg Chest 2 View  08/22/2015  CLINICAL DATA:  Pancreatitis. EXAM: CHEST  2 VIEW COMPARISON:  None. FINDINGS: The cardiomediastinal contours are normal. Borderline hyperinflation. Pulmonary vasculature is normal. No consolidation, pleural effusion, or pneumothorax. No acute osseous abnormalities are seen. IMPRESSION: No acute pulmonary process. Electronically Signed   By: Jeb Levering M.D.   On: 08/22/2015 03:12   US Abdomen Limited  08/21/2015  CLINICAL DATA:  55 year old female with acute right upper quadrant pain since 1330 hours today. Initial encounter. EXAM: US ABDOMEN LIMITED - RIGHT UPPER QUADRANT COMPARISON:  None. FINDINGS: Gallbladder: Multiple echogenic stones with shadowing, individually up to 19 mm diameter.  These appear mobile. However, there is mild gallbladder wall thickening up to 5 mm. No pericholecystic fluid identified. No sonographic Murphy sign elicited. Common bile duct: Diameter: 6 mm, within normal limits Liver: Overall liver echogenicity is within normal limits. No discrete liver lesion, but suggestion of central intrahepatic biliary ductal dilatation in the right lobe (image 13). The left lobe appears spared. Other findings: There is a solid, vascular 7.2 cm mass in the lower pole of the right kidney (image 32 and 33). In all this measures 6.3 x 7.2 x 6.8 cm. The right upper pole appears normal. The hilum is partially obscured by this mass. No abdominal free fluid identified. IMPRESSION: 1. Incidentally discovered 7.2 cm vascular mass in the lower pole of the right kidney most compatible with Renal Cell Carcinoma. Recommend Urology consultation. 2. Cholelithiasis with mild gallbladder wall thickening with right intrahepatic biliary ductal enlargement. Despite absent sonographic Murphy sign, the appearance is highly suspicious for acute cholecystitis and/or acute biliary obstruction. Electronically Signed   By: Genevie Ann M.D.   On: 08/21/2015 16:40    Demika Langenderfer Cletis Media, MD 08/23/2015, 7:23 AM PGY-3, [place] Intern pager: [phone], text pages welcome

## 2015-08-23 NOTE — Progress Notes (Signed)
Patient ID: Dana Hanson, female   DOB: 1961-04-03, 55 y.o.   MRN: [id number]     [facility]      [address] [place], [place] [address]    Phone: [phone] FAX: [phone]     Subjective: Pain resolved. No n/v. Passing flatus. Afebrile. Labs are pending.   Objective:  Vital signs:  Filed Vitals:   08/22/15 0522 08/22/15 1343 08/22/15 2202 08/23/15 0702  BP: 99/50 114/53 117/52 126/77  Pulse: 56 59 56 60  Temp: 98.7 F (37.1 C) 98 F (36.7 C) 98.1 F (36.7 C) 98.2 F (36.8 C)  TempSrc: Oral Oral Oral Oral  Resp: _0 SpO2: 97% 97% 96% 96%    Last BM Date: 08/20/15  Intake/Output   Yesterday:  02/05 0701 - 02/06 0700 In: 4318.3 [I.V.:4218.3; IV Piggyback:100] Out: 300 [Urine:300] This shift: I/O last 3 completed shifts: In: 6857.1 [I.V.:6757.1; IV Piggyback:100] Out: 300 [Urine:300]    Physical Exam: General: Pt awake/alert/oriented x4 in no acute distress Abdomen: Soft.  Nondistended.  Non tender.  No evidence of peritonitis.  No incarcerated hernias.    Problem List:   Active Problems:   Pancreatitis   Biliary obstruction   Renal mass, right    Results:   Labs: Results for orders placed or performed during the hospital encounter of 08/21/15 (from the past 48 hour(s))  Lipase, blood     Status: Abnormal   Collection Time: 08/21/15  2:45 PM  Result Value Ref Range   Lipase >3000 (H) 11 - 51 U/L    Comment: RESULTS CONFIRMED BY MANUAL DILUTION  Comprehensive metabolic panel     Status: Abnormal   Collection Time: 08/21/15  2:45 PM  Result Value Ref Range   Sodium 145 135 - 145 mmol/L   Potassium 4.1 3.5 - 5.1 mmol/L   Chloride 107 101 - 111 mmol/L   CO2 27 22 - 32 mmol/L   Glucose, Bld 106 (H) 65 - 99 mg/dL   BUN 14 6 - 20 mg/dL   Creatinine, Ser 0.94 0.44 - 1.00 mg/dL   Calcium 10.1 8.9 - 10.3 mg/dL   Total Protein 6.6 6.5 - 8.1 g/dL   Albumin 4.1 3.5 - 5.0 g/dL   AST  27 15 - 41 U/L   ALT 12 (L) 14 - 54 U/L   Alkaline Phosphatase 50 38 - 126 U/L   Total Bilirubin 1.0 0.3 - 1.2 mg/dL   GFR calc non Af Amer >60 >60 mL/min   GFR calc Af Amer >60 >60 mL/min    Comment: (NOTE) The eGFR has been calculated using the CKD EPI equation. This calculation has not been validated in all clinical situations. eGFR's persistently <60 mL/min signify possible Chronic Kidney Disease.    Anion gap 11 5 - 15  CBC     Status: None   Collection Time: 08/21/15  2:45 PM  Result Value Ref Range   WBC 8.3 4.0 - 10.5 K/uL   RBC 4.68 3.87 - 5.11 MIL/uL   Hemoglobin 13.2 12.0 - 15.0 g/dL   HCT 39.8 36.0 - 46.0 %   MCV 85.0 78.0 - 100.0 fL   MCH 28.2 26.0 - 34.0 pg   MCHC 33.2 30.0 - 36.0 g/dL   RDW 12.9 11.5 - 15.5 %   Platelets 232 150 - 400 K/uL  I-Stat Troponin, ED (not at [facility])     Status: None   Collection Time: 08/21/15  2:53 PM  Result Value Ref Range   Troponin i, poc 0.00 0.00 - 0.08 ng/mL   Comment 3            Comment: Due to the release kinetics of cTnI, a negative result within the first hours of the onset of symptoms does not rule out myocardial infarction with certainty. If myocardial infarction is still suspected, repeat the test at appropriate intervals.   Urinalysis, Routine w reflex microscopic (not at [facility])     Status: Abnormal   Collection Time: 08/21/15  5:02 PM  Result Value Ref Range   Color, Urine YELLOW YELLOW   APPearance TURBID (A) CLEAR   Specific Gravity, Urine 1.017 1.005 - 1.030   pH 8.0 5.0 - 8.0   Glucose, UA NEGATIVE NEGATIVE mg/dL   Hgb urine dipstick NEGATIVE NEGATIVE   Bilirubin Urine NEGATIVE NEGATIVE   Ketones, ur 15 (A) NEGATIVE mg/dL   Protein, ur NEGATIVE NEGATIVE mg/dL   Nitrite NEGATIVE NEGATIVE   Leukocytes, UA NEGATIVE NEGATIVE  Urine microscopic-add on     Status: Abnormal   Collection Time: 08/21/15  5:02 PM  Result Value Ref Range   Squamous Epithelial / LPF 0-5 (A) NONE SEEN   WBC, UA NONE SEEN 0 - 5  WBC/hpf   RBC / HPF 0-5 0 - 5 RBC/hpf   Bacteria, UA NONE SEEN NONE SEEN  Pregnancy, urine     Status: None   Collection Time: 08/21/15 11:53 PM  Result Value Ref Range   Preg Test, Ur NEGATIVE NEGATIVE    Comment:        THE SENSITIVITY OF THIS METHODOLOGY IS >20 mIU/mL.   Comprehensive metabolic panel     Status: Abnormal   Collection Time: 08/22/15  5:38 AM  Result Value Ref Range   Sodium 140 135 - 145 mmol/L   Potassium 3.6 3.5 - 5.1 mmol/L   Chloride 107 101 - 111 mmol/L   CO2 26 22 - 32 mmol/L   Glucose, Bld 123 (H) 65 - 99 mg/dL   BUN 8 6 - 20 mg/dL   Creatinine, Ser 0.66 0.44 - 1.00 mg/dL   Calcium 8.6 (L) 8.9 - 10.3 mg/dL   Total Protein 5.8 (L) 6.5 - 8.1 g/dL   Albumin 3.4 (L) 3.5 - 5.0 g/dL   AST 223 (H) 15 - 41 U/L   ALT 142 (H) 14 - 54 U/L   Alkaline Phosphatase 71 38 - 126 U/L   Total Bilirubin 1.2 0.3 - 1.2 mg/dL   GFR calc non Af Amer >60 >60 mL/min   GFR calc Af Amer >60 >60 mL/min    Comment: (NOTE) The eGFR has been calculated using the CKD EPI equation. This calculation has not been validated in all clinical situations. eGFR's persistently <60 mL/min signify possible Chronic Kidney Disease.    Anion gap 7 5 - 15  CBC     Status: None   Collection Time: 08/22/15  5:38 AM  Result Value Ref Range   WBC 8.2 4.0 - 10.5 K/uL   RBC 4.31 3.87 - 5.11 MIL/uL   Hemoglobin 12.3 12.0 - 15.0 g/dL   HCT 36.5 36.0 - 46.0 %   MCV 84.7 78.0 - 100.0 fL   MCH 28.5 26.0 - 34.0 pg   MCHC 33.7 30.0 - 36.0 g/dL   RDW 13.1 11.5 - 15.5 %   Platelets 193 150 - 400 K/uL    Imaging / Studies: Ct Abdomen Pelvis W Wo Contrast  08/22/2015  CLINICAL DATA:  Renal cell carcinoma.  Gallstone pancreatitis. EXAM: CT ABDOMEN AND PELVIS WITHOUT AND WITH CONTRAST TECHNIQUE: Multidetector CT imaging of the abdomen and pelvis was performed following the standard protocol before and following the bolus administration of intravenous contrast. CONTRAST:  142m OMNIPAQUE IOHEXOL 350 MG/ML  SOLN COMPARISON:  Sonography from yesterday FINDINGS: Lower chest and abdominal wall:  Fatty left lumbar hernia. Hepatobiliary: Cholelithiasis with gallbladder wall thickening and pericholecystic edema, correlating with the sonography findings. No common bile duct enlargement or calcified choledocholithiasis. Perfusion anomalies around the gallbladder fossa. Pancreas: No evidence of metastasis.  No evidence of pancreatitis. Spleen: Unremarkable. Adrenals/Urinary Tract:  Negative adrenals. Solid avidly enhancing 8 cm mass arising from the lower pole right kidney consistent with renal cell carcinoma. Mass extends beyond the margins of the cortex into the perinephric fat, but does not clearly grow beyond Gerota's fascia. There is no visible renal vein invasion or adenopathy. Single right renal artery. Two left renal arteries. No notable left renal scarring. Other renal cortical low densities appear cystic. Reproductive:Negative. Stomach/Bowel: No obstruction. No appendicitis. Mild colonic diverticulosis. Gastric sleeve procedure. Vascular/Lymphatic: No acute vascular abnormality. No mass or adenopathy. Peritoneal: Small pelvic fluid considered reactive. Musculoskeletal: No acute abnormalities. IMPRESSION: 1. CT findings of acute cholecystitis, as seen on preceding sonography. No imaging correlate for patient's pancreatitis. 2. 8 cm right lower pole renal mass consistent with renal cell carcinoma. There is perinephric fat invasion without renal vein involvement or adenopathy. Electronically Signed   By: JMonte FantasiaM.D.   On: 08/22/2015 03:22   Dg Chest 2 View  08/22/2015  CLINICAL DATA:  Pancreatitis. EXAM: CHEST  2 VIEW COMPARISON:  None. FINDINGS: The cardiomediastinal contours are normal. Borderline hyperinflation. Pulmonary vasculature is normal. No consolidation, pleural effusion, or pneumothorax. No acute osseous abnormalities are seen. IMPRESSION: No acute pulmonary process. Electronically Signed   By:  MJeb LeveringM.D.   On: 08/22/2015 03:12   [place]dom[place] Limited  08/21/2015  CLINICAL DATA:  55year old female with acute right upper quadrant pain since 1330 hours today. Initial encounter. EXAM: [place]DOM[place] LIMITED - RIGHT UPPER QUADRANT COMPARISON:  None. FINDINGS: Gallbladder: Multiple echogenic stones with shadowing, individually up to 19 mm diameter. These appear mobile. However, there is mild gallbladder wall thickening up to 5 mm. No pericholecystic fluid identified. No sonographic Murphy sign elicited. Common bile duct: Diameter: 6 mm, within normal limits Liver: Overall liver echogenicity is within normal limits. No discrete liver lesion, but suggestion of central intrahepatic biliary ductal dilatation in the right lobe (image 13). The left lobe appears spared. Other findings: There is a solid, vascular 7.2 cm mass in the lower pole of the right kidney (image 32 and 33). In all this measures 6.3 x 7.2 x 6.8 cm. The right upper pole appears normal. The hilum is partially obscured by this mass. No abdominal free fluid identified. IMPRESSION: 1. Incidentally discovered 7.2 cm vascular mass in the lower pole of the right kidney most compatible with Renal Cell Carcinoma. Recommend Urology consultation. 2. Cholelithiasis with mild gallbladder wall thickening with right intrahepatic biliary ductal enlargement. Despite absent sonographic Murphy sign, the appearance is highly suspicious for acute cholecystitis and/or acute biliary obstruction. Electronically Signed   By: HGenevie AnnM.D.   On: 08/21/2015 16:40    Medications / Allergies:  Scheduled Meds: . calcium carbonate  1 tablet Oral Q breakfast  . enoxaparin (LOVENOX) injection  40 mg Subcutaneous Q24H  . multivitamin with minerals  1 tablet Oral  Daily  . piperacillin-tazobactam (ZOSYN)  IV  3.375 g Intravenous Q8H   Continuous Infusions: . dexrose 5 % and 0.45 % NaCl with KCl 30 mEq/L 100 mL/hr at 08/22/15 2310   PRN Meds:.acetaminophen **OR**  acetaminophen, HYDROmorphone (DILAUDID) injection, ondansetron **OR** ondansetron (ZOFRAN) IV  Antibiotics: Anti-infectives    Start     Dose/Rate Route Frequency Ordered Stop   08/22/15 1000  piperacillin-tazobactam (ZOSYN) IVPB 3.375 g     3.375 g 12.5 mL/hr over 240 Minutes Intravenous Every 8 hours 08/22/15 0855          Assessment/Plan Gallstone pancreatitis-non tender on exam.  Await labs.  Will contact Dr. Louis Meckel to schedule a combined procedure depending on his schedule, may need to be done on outpatient basis. Right renal mass-as above.  Urology recommends radical nephrectomy  FEN-may give clears VTE prophylaxis-SCD/lovenox Dispo-pending surgical coordination   Erby Pian, [facility] Pager [phone](7A-4:30P) For consults and floor pages call [phone](7A-4:30P)  08/23/2015 8:02 AM

## 2015-08-24 ENCOUNTER — Other Ambulatory Visit: Payer: Self-pay | Admitting: General Surgery

## 2015-08-24 ENCOUNTER — Other Ambulatory Visit: Payer: Self-pay | Admitting: Urology

## 2015-08-24 LAB — COMPREHENSIVE METABOLIC PANEL
ALT: 80 U/L — AB (ref 14–54)
AST: 43 U/L — AB (ref 15–41)
Albumin: 3.7 g/dL (ref 3.5–5.0)
Alkaline Phosphatase: 71 U/L (ref 38–126)
Anion gap: 11 (ref 5–15)
BILIRUBIN TOTAL: 1.5 mg/dL — AB (ref 0.3–1.2)
BUN: 6 mg/dL (ref 6–20)
CO2: 25 mmol/L (ref 22–32)
CREATININE: 0.74 mg/dL (ref 0.44–1.00)
Calcium: 9.7 mg/dL (ref 8.9–10.3)
Chloride: 105 mmol/L (ref 101–111)
Glucose, Bld: 90 mg/dL (ref 65–99)
Potassium: 4.4 mmol/L (ref 3.5–5.1)
Sodium: 141 mmol/L (ref 135–145)
TOTAL PROTEIN: 6.3 g/dL — AB (ref 6.5–8.1)

## 2015-08-24 NOTE — Discharge Instructions (Signed)
You were admitted for pancreatitis. Likely due to the presence of gallstones obstructing your gallbladder. The obstruction has since resolved per clinical and laboratory findings. You will need to have your gallbladder removed. We also found a renal mass on your right kidney we have discussed this with urology. Arms for removal of both gallbladder and kidney mass as an outpatient.   Acute Pancreatitis Acute pancreatitis is a disease in which the pancreas becomes suddenly inflamed. The pancreas is a large gland located behind your stomach. The pancreas produces enzymes that help digest food. The pancreas also releases the hormones glucagon and insulin that help regulate blood sugar. Damage to the pancreas occurs when the digestive enzymes from the pancreas are activated and begin attacking the pancreas before being released into the intestine. Most acute attacks last a couple of days and can cause serious complications. Some people become dehydrated and develop low blood pressure. In severe cases, bleeding into the pancreas can lead to shock and can be life-threatening. The lungs, heart, and kidneys may fail. CAUSES  Pancreatitis can happen to anyone. In some cases, the cause is unknown. Most cases are caused by:  Alcohol abuse.  Gallstones. Other less common causes are:  Certain medicines.  Exposure to certain chemicals.  Infection.  Damage caused by an accident (trauma).  Abdominal surgery. SYMPTOMS   Pain in the upper abdomen that may radiate to the back.  Tenderness and swelling of the abdomen.  Nausea and vomiting. DIAGNOSIS  Your caregiver will perform a physical exam. Blood and stool tests may be done to confirm the diagnosis. Imaging tests may also be done, such as X-rays, CT scans, or an ultrasound of the abdomen. TREATMENT  Treatment usually requires a stay in the hospital. Treatment may include:  Pain medicine.  Fluid replacement through an intravenous line  (IV).  Placing a tube in the stomach to remove stomach contents and control vomiting.  Not eating for 3 or 4 days. This gives your pancreas a rest, because enzymes are not being produced that can cause further damage.  Antibiotic medicines if your condition is caused by an infection.  Surgery of the pancreas or gallbladder. HOME CARE INSTRUCTIONS   Follow the diet advised by your caregiver. This may involve avoiding alcohol and decreasing the amount of fat in your diet.  Eat smaller, more frequent meals. This reduces the amount of digestive juices the pancreas produces.  Drink enough fluids to keep your urine clear or pale yellow.  Only take over-the-counter or prescription medicines as directed by your caregiver.  Avoid drinking alcohol if it caused your condition.  Do not smoke.  Get plenty of rest.  Check your blood sugar at home as directed by your caregiver.  Keep all follow-up appointments as directed by your caregiver. SEEK MEDICAL CARE IF:   You do not recover as quickly as expected.  You develop new or worsening symptoms.  You have persistent pain, weakness, or nausea.  You recover and then have another episode of pain. SEEK IMMEDIATE MEDICAL CARE IF:   You are unable to eat or keep fluids down.  Your pain becomes severe.  You have a fever or persistent symptoms for more than 2 to 3 days.  You have a fever and your symptoms suddenly get worse.  Your skin or the white part of your eyes turn yellow (jaundice).  You develop vomiting.  You feel dizzy, or you faint.  Your blood sugar is high (over 300 mg/dL). MAKE SURE YOU:  Understand these instructions.  Will watch your condition.  Will get help right away if you are not doing well or get worse.   This information is not intended to replace advice given to you by your health care provider. Make sure you discuss any questions you have with your health care provider.   Document Released: 07/03/2005  Document Revised: 01/02/2012 Document Reviewed: 10/12/2011 Elsevier Interactive Patient Education 2016 [employer].   Cholelithiasis Cholelithiasis (also called gallstones) is a form of gallbladder disease in which gallstones form in your gallbladder. The gallbladder is an organ that stores bile made in the liver, which helps digest fats. Gallstones begin as small crystals and slowly grow into stones. Gallstone pain occurs when the gallbladder spasms and a gallstone is blocking the duct. Pain can also occur when a stone passes out of the duct.  RISK FACTORS  Being female.   Having multiple pregnancies. Health care providers sometimes advise removing diseased gallbladders before future pregnancies.   Being obese.  Eating a diet heavy in fried foods and fat.   Being older than 18 years and increasing age.   Prolonged use of medicines containing female hormones.   Having diabetes mellitus.   Rapidly losing weight.   Having a family history of gallstones (heredity).  SYMPTOMS  Nausea.   Vomiting.  Abdominal pain.   Yellowing of the skin (jaundice).   Sudden pain. It may persist from several minutes to several hours.  Fever.   Tenderness to the touch. In some cases, when gallstones do not move into the bile duct, people have no pain or symptoms. These are called "silent" gallstones.  TREATMENT Silent gallstones do not need treatment. In severe cases, emergency surgery may be required. Options for treatment include:  Surgery to remove the gallbladder. This is the most common treatment.  Medicines. These do not always work and may take 6-12 months or more to work.  Shock wave treatment (extracorporeal biliary lithotripsy). In this treatment an ultrasound machine sends shock waves to the gallbladder to break gallstones into smaller pieces that can pass into the intestines or be dissolved by medicine. HOME CARE INSTRUCTIONS   Only take over-the-counter or  prescription medicines for pain, discomfort, or fever as directed by your health care provider.   Follow a low-fat diet until seen again by your health care provider. Fat causes the gallbladder to contract, which can result in pain.   Follow up with your health care provider as directed. Attacks are almost always recurrent and surgery is usually required for permanent treatment.  SEEK IMMEDIATE MEDICAL CARE IF:   Your pain increases and is not controlled by medicines.   You have a fever or persistent symptoms for more than 2-3 days.   You have a fever and your symptoms suddenly get worse.   You have persistent nausea and vomiting.  MAKE SURE YOU:   Understand these instructions.  Will watch your condition.  Will get help right away if you are not doing well or get worse.   This information is not intended to replace advice given to you by your health care provider. Make sure you discuss any questions you have with your health care provider.   Document Released: 06/29/2005 Document Revised: 03/05/2013 Document Reviewed: 12/25/2012 Elsevier Interactive Patient Education [employer].

## 2015-08-24 NOTE — Progress Notes (Signed)
Nsg Discharge Note  Admit Date:  08/21/2015 Discharge date: 08/24/2015   Dana Hanson to be D/C'd Home per MD order.  AVS completed.  Copy for chart, and copy for patient signed, and dated. Patient/caregiver able to verbalize understanding.  Discharge Medication:   Medication List    STOP taking these medications        ibuprofen 200 MG tablet  Commonly known as:  ADVIL,MOTRIN      TAKE these medications        calcium carbonate 1250 (500 Ca) MG tablet  Commonly known as:  OS-CAL - dosed in mg of elemental calcium  Take 1 tablet by mouth daily with breakfast.     ferrous sulfate 325 (65 FE) MG tablet  Take 325 mg by mouth daily with breakfast.     HAIR/SKIN/NAILS Tabs  Take 2 tablets by mouth daily.     multivitamin with minerals Tabs tablet  Take 1 tablet by mouth daily.        Discharge Assessment: Filed Vitals:   08/23/15 1501 08/24/15 0514  BP: 125/81 124/66  Pulse: 74 54  Temp: 98.2 F (36.8 C) 98.4 F (36.9 C)  Resp: 18 18   Skin clean, dry and intact without evidence of skin break down, no evidence of skin tears noted. IV catheter discontinued intact. Site without signs and symptoms of complications - no redness or edema noted at insertion site, patient denies c/o pain - only slight tenderness at site.  Dressing with slight pressure applied.  D/c Instructions-Education: Discharge instructions given to patient/family with verbalized understanding. D/c education completed with patient/family including follow up instructions, medication list, d/c activities limitations if indicated, with other d/c instructions as indicated by MD - patient able to verbalize understanding, all questions fully answered. Patient instructed to return to ED, call 911, or call MD for any changes in condition.  Patient escorted via [place], and D/C home via private auto.  Dayle Points, RN 08/24/2015 12:31 PM

## 2015-08-24 NOTE — Progress Notes (Signed)
Patient ID: Dana Hanson, female   DOB: 1961/03/03, 55 y.o.   MRN: [id number]     [facility]      [address] [place], [place] [address]    Phone: [phone] FAX: [phone]     Subjective: No pain.  No n/v.  Tolerated solids last night.  Afebrile.   Objective:  Vital signs:  Filed Vitals:   08/22/15 2202 08/23/15 0702 08/23/15 1501 08/24/15 0514  BP: 117/52 126/77 125/81 124/66  Pulse: 56 60 74 54  Temp: 98.1 F (36.7 C) 98.2 F (36.8 C) 98.2 F (36.8 C) 98.4 F (36.9 C)  TempSrc: Oral Oral Oral Oral  Resp: 18 18 18 18   SpO2: 96% 96% 94% 97%    Last BM Date: 08/20/15  Intake/Output   Yesterday:  02/06 0701 - 02/07 0700 In: 1340 [P.O.:1340] Out: -  This shift:    I/O last 3 completed shifts: In: 2470 [P.O.:1340; I.V.:1080; IV Piggyback:50] Out: -      Physical Exam: General: Pt awake/alert/oriented x4 in no acute distress Abdomen: Soft. Nondistended. Non tender. No evidence of peritonitis. No incarcerated hernias.    Problem List:   Active Problems:   Pancreatitis   Biliary obstruction   Renal mass, right    Results:   Labs: Results for orders placed or performed during the hospital encounter of 08/21/15 (from the past 48 hour(s))  Comprehensive metabolic panel     Status: Abnormal   Collection Time: 08/23/15  6:44 AM  Result Value Ref Range   Sodium 143 135 - 145 mmol/L   Potassium 4.2 3.5 - 5.1 mmol/L   Chloride 108 101 - 111 mmol/L   CO2 27 22 - 32 mmol/L   Glucose, Bld 104 (H) 65 - 99 mg/dL   BUN <5 (L) 6 - 20 mg/dL   Creatinine, Ser 0.75 0.44 - 1.00 mg/dL   Calcium 9.2 8.9 - 10.3 mg/dL   Total Protein 5.7 (L) 6.5 - 8.1 g/dL   Albumin 3.3 (L) 3.5 - 5.0 g/dL   AST 90 (H) 15 - 41 U/L   ALT 104 (H) 14 - 54 U/L   Alkaline Phosphatase 65 38 - 126 U/L   Total Bilirubin 1.5 (H) 0.3 - 1.2 mg/dL   GFR calc non Af Amer >60 >60 mL/min   GFR calc Af Amer >60 >60 mL/min    Comment:  (NOTE) The eGFR has been calculated using the CKD EPI equation. This calculation has not been validated in all clinical situations. eGFR's persistently <60 mL/min signify possible Chronic Kidney Disease.    Anion gap 8 5 - 15  Lipase, blood     Status: Abnormal   Collection Time: 08/23/15  6:44 AM  Result Value Ref Range   Lipase 138 (H) 11 - 51 U/L    Imaging / Studies: No results found.  Medications / Allergies:  Scheduled Meds: . calcium carbonate  1 tablet Oral Q breakfast  . enoxaparin (LOVENOX) injection  40 mg Subcutaneous Q24H  . multivitamin with minerals  1 tablet Oral Daily   Continuous Infusions:  PRN Meds:.acetaminophen **OR** acetaminophen, ondansetron **OR** ondansetron (ZOFRAN) IV  Antibiotics: Anti-infectives    Start     Dose/Rate Route Frequency Ordered Stop   08/22/15 1000  piperacillin-tazobactam (ZOSYN) IVPB 3.375 g  Status:  Discontinued     3.375 g 12.5 mL/hr over 240 Minutes Intravenous Every 8 hours 08/22/15 0855 08/23/15 0859  Assessment/Plan Gallstone pancreatitis-resolved.   Discharge home today.  Low fat diet.  She understands that there is small risk of developing pancreatitis in the interim.  Dr. Carlton Adam office to coordinate cholecystectomy combined with a nephrectomy. Right renal mass-spoke with Dr. Louis Meckel.  Pt has a appt at the end of the day, but will call office for exact time.   FEN-tolerating soft diet.  VTE prophylaxis-SCD/lovenox Dispo-stable for DC  Erby Pian, [facility] Pager [phone]) For consults and floor pages call [phone](7A-4:30P)  08/24/2015 8:30 AM

## 2015-08-24 NOTE — Discharge Summary (Signed)
[facility] Discharge Summary  Patient name: Dana Hanson Medical record number: [id number] Date of birth: 01-02-1961 Age: 55 y.o. Gender: female Date of Admission: 08/21/2015  Date of Discharge: 08/24/2015  Admitting Physician: Lind Covert, MD  Primary Care Provider: [name], MD Consultants: General Surgery, Urology   Indication for Hospitalization: Gallbladder Pancreatitis   Discharge Diagnoses/Problem List:    Disposition: Home  Discharge Condition: Stable  Discharge Exam:   General: Lying in bed, NAD  ENTM: Mucosa membranes moist Cardiovascular:Regular rate and rhythm. No murmurs, gallops or rubs.  Respiratory: CTAB, no wheezes, or rhonchi  Abdomen: BS+, no ttp, no rebound,  MSK: No lower extremity edema   Brief Hospital Course:  Patient presented with sudden onset of 10/10 epigastric/right upper quadrant pain with recent history of gastric sleeve surgery within the last 6 months. Found to have gallstone pancreatitis with a lipase > 3000 and RUQ u/s significant for cholilethasis with mild gallbladder wall thickening. CBC wnl, bilirubin and alkaline phosphatase wnl, no fever or malaise inidicating this likely a less severe presentation of biliary obstruction. As patient progressed, AST/ALT trended up  223/142 increasing pain. CT was significant for small presence of cholecystitis. General surgery was consulted, and was considering cholecystectomy. However the following day patient improved and labs including lipase trended down to 138 as well as AST/ALT.   Ultrasound also noted an incidental mass 8 cm in size on right pole of kidney with concern for renal cell carcinoma. Urology was consulted, and indicated that patient would need radical nephrectomy. Gen. surgery and urology plans to call collaborate to perform both cholecystectomy and nephrectomy as an outpatient surgical procedure.  Issues for Follow Up:  1. Follow up with  urology and general surgery for surgical removal of gallbladder and renal mass.   Significant Procedures: None  Significant Labs and Imaging:   Recent Labs Lab 08/21/15 1445 08/22/15 0538  WBC 8.3 8.2  HGB 13.2 12.3  HCT 39.8 36.5  PLT 232 193    Recent Labs Lab 08/21/15 1445 08/22/15 0538 08/23/15 0644 08/24/15 0816  NA 145 140 143 141  K 4.1 3.6 4.2 4.4  CL 107 107 108 105  CO2 27 26 27 25   GLUCOSE 106* 123* 104* 90  BUN 14 8 <5* 6  CREATININE 0.94 0.66 0.75 0.74  CALCIUM 10.1 8.6* 9.2 9.7  ALKPHOS 50 71 65 71  AST 27 223* 90* 43*  ALT 12* 142* 104* 80*  ALBUMIN 4.1 3.4* 3.3* 3.7    Ct Abdomen Pelvis W Wo Contrast  08/22/2015  CLINICAL DATA:  Renal cell carcinoma.  Gallstone pancreatitis. EXAM: CT ABDOMEN AND PELVIS WITHOUT AND WITH CONTRAST TECHNIQUE: Multidetector CT imaging of the abdomen and pelvis was performed following the standard protocol before and following the bolus administration of intravenous contrast. CONTRAST:  174mL OMNIPAQUE IOHEXOL 350 MG/ML SOLN COMPARISON:  Sonography from yesterday FINDINGS: Lower chest and abdominal wall:  Fatty left lumbar hernia. Hepatobiliary: Cholelithiasis with gallbladder wall thickening and pericholecystic edema, correlating with the sonography findings. No common bile duct enlargement or calcified choledocholithiasis. Perfusion anomalies around the gallbladder fossa. Pancreas: No evidence of metastasis.  No evidence of pancreatitis. Spleen: Unremarkable. Adrenals/Urinary Tract:  Negative adrenals. Solid avidly enhancing 8 cm mass arising from the lower pole right kidney consistent with renal cell carcinoma. Mass extends beyond the margins of the cortex into the perinephric fat, but does not clearly grow beyond Gerota's fascia. There is no visible renal vein invasion or adenopathy. Single  right renal artery. Two left renal arteries. No notable left renal scarring. Other renal cortical low densities appear cystic.  Reproductive:Negative. Stomach/Bowel: No obstruction. No appendicitis. Mild colonic diverticulosis. Gastric sleeve procedure. Vascular/Lymphatic: No acute vascular abnormality. No mass or adenopathy. Peritoneal: Small pelvic fluid considered reactive. Musculoskeletal: No acute abnormalities. IMPRESSION: 1. CT findings of acute cholecystitis, as seen on preceding sonography. No imaging correlate for patient's pancreatitis. 2. 8 cm right lower pole renal mass consistent with renal cell carcinoma. There is perinephric fat invasion without renal vein involvement or adenopathy. Electronically Signed   By: Monte Fantasia M.D.   On: 08/22/2015 03:22   Dg Chest 2 View  08/22/2015  CLINICAL DATA:  Pancreatitis. EXAM: CHEST  2 VIEW COMPARISON:  None. FINDINGS: The cardiomediastinal contours are normal. Borderline hyperinflation. Pulmonary vasculature is normal. No consolidation, pleural effusion, or pneumothorax. No acute osseous abnormalities are seen. IMPRESSION: No acute pulmonary process. Electronically Signed   By: Jeb Levering M.D.   On: 08/22/2015 03:12   US Abdomen Limited  08/21/2015  CLINICAL DATA:  55 year old female with acute right upper quadrant pain since 1330 hours today. Initial encounter. EXAM: US ABDOMEN LIMITED - RIGHT UPPER QUADRANT COMPARISON:  None. FINDINGS: Gallbladder: Multiple echogenic stones with shadowing, individually up to 19 mm diameter. These appear mobile. However, there is mild gallbladder wall thickening up to 5 mm. No pericholecystic fluid identified. No sonographic Murphy sign elicited. Common bile duct: Diameter: 6 mm, within normal limits Liver: Overall liver echogenicity is within normal limits. No discrete liver lesion, but suggestion of central intrahepatic biliary ductal dilatation in the right lobe (image 13). The left lobe appears spared. Other findings: There is a solid, vascular 7.2 cm mass in the lower pole of the right kidney (image 32 and 33). In all this measures  6.3 x 7.2 x 6.8 cm. The right upper pole appears normal. The hilum is partially obscured by this mass. No abdominal free fluid identified. IMPRESSION: 1. Incidentally discovered 7.2 cm vascular mass in the lower pole of the right kidney most compatible with Renal Cell Carcinoma. Recommend Urology consultation. 2. Cholelithiasis with mild gallbladder wall thickening with right intrahepatic biliary ductal enlargement. Despite absent sonographic Murphy sign, the appearance is highly suspicious for acute cholecystitis and/or acute biliary obstruction. Electronically Signed   By: Genevie Ann M.D.   On: 08/21/2015 16:40    Results/Tests Pending at Time of Discharge: None   Discharge Medications:    Medication List    STOP taking these medications        ibuprofen 200 MG tablet  Commonly known as:  ADVIL,MOTRIN      TAKE these medications        calcium carbonate 1250 (500 Ca) MG tablet  Commonly known as:  OS-CAL - dosed in mg of elemental calcium  Take 1 tablet by mouth daily with breakfast.     ferrous sulfate 325 (65 FE) MG tablet  Take 325 mg by mouth daily with breakfast.     HAIR/SKIN/NAILS Tabs  Take 2 tablets by mouth daily.     multivitamin with minerals Tabs tablet  Take 1 tablet by mouth daily.        Discharge Instructions: Please refer to Patient Instructions section of EMR for full details.  Patient was counseled important signs and symptoms that should prompt return to medical care, changes in medications, dietary instructions, activity restrictions, and follow up appointments.   Follow-Up Appointments: Follow-up Information    Follow up with [name], MD.  Go in 1 week.   Specialty:  Internal Medicine   Why:  Hospital follow-up   Contact information:   [address] [place] [address] [phone]       Follow up with Ardis Hughs, MD On 08/24/2015.   Specialty:  Urology   Why:  arrive by 3:15PM for a 3:45PM appt with the urologist/kidney sugeon.   office is located on the 2nd floor.    Contact information:   [place] [address] [phone]       Follow up with Rolm Bookbinder, MD.   Specialty:  General Surgery   Why:  As needed, tif you have issues with urology office scheduling combined surgery   Contact information:   [address] [place] [address] [phone]       Jil Penland Cletis Media, MD 08/24/2015, 11:04 AM PGY-1, [place]

## 2015-08-24 NOTE — Care Management Note (Signed)
Case Management Note  Patient Details  Name: MOHINI PINION MRN: [id number] Date of Birth: 02-26-1961  Subjective/Objective:               Independent patient admitted from home.  Spoke with patient at the bedside, she denied any barriers to care.    Action/Plan:  Patient will to home, self care today.  Expected Discharge Date:                  Expected Discharge Plan:  Home/Self Care  In-House Referral:     Discharge planning Services  CM Consult  Post Acute Care Choice:    Choice offered to:     DME Arranged:    DME Agency:     HH Arranged:    [place] Agency:     Status of Service:  Completed, signed off  Medicare Important Message Given:    Date Medicare IM Given:    Medicare IM give by:    Date Additional Medicare IM Given:    Additional Medicare Important Message give by:     If discussed at [place] of Stay Meetings, dates discussed:    Additional Comments:  Carles Collet, RN 08/24/2015, 12:35 PM

## 2015-08-30 NOTE — Patient Instructions (Addendum)
Dana Hanson  08/30/2015   Your procedure is scheduled on: 09-03-15  Report to [facility] Main  Entrance take [facility]  elevators to 3rd floor to  [place] at 530 AM.  Call this number if you have problems the morning of surgery [phone]   Remember: ONLY 1 PERSON MAY GO WITH YOU TO SHORT STAY TO GET  READY MORNING OF [place].  Do not eat food after Wednesday morning per dr Kieth Brightly instructons .  Clear liquids and follow all bowel prep instructions from dr kinsinger's office.     Take these medicines the morning of surgery with A SIP OF WATER: none              You may not have any metal on your body including hair pins and              piercings  Do not wear jewelry, make-up, lotions, powders or perfumes, deodorant             Do not wear nail polish.  Do not shave  48 hours prior to surgery.              Men may shave face and neck.   Do not bring valuables to the hospital. [place].  Contacts, dentures or bridgework may not be worn into surgery.  Leave suitcase in the car. After surgery it may be brought to your room.     Patients discharged the day of surgery will not be allowed to drive home.  Name and phone number of your driver:  Special Instructions: N/A              Please read over the following fact sheets you were given: _____________________________________________________________________                CLEAR LIQUID DIET   Foods Allowed                                                                     Foods Excluded  Coffee and tea, regular and decaf                             liquids that you cannot  Plain Jell-O in any flavor                                             see through such as: Fruit ices (not with fruit pulp)                                     milk, soups, orange juice  Iced Popsicles  All solid food Carbonated  beverages, regular and diet                                    Cranberry, grape and apple juices Sports drinks like Gatorade Lightly seasoned clear broth or consume(fat free) Sugar, honey syrup  Sample Menu Breakfast                                Lunch                                     Supper Cranberry juice                    Beef broth                            Chicken broth Jell-O                                     Grape juice                           Apple juice Coffee or tea                        Jell-O                                      Popsicle                                                Coffee or tea                        Coffee or tea  _____________________________________________________________________  [facility] - Preparing for Surgery Before surgery, you can play an important role.  Because skin is not sterile, your skin needs to be as free of germs as possible.  You can reduce the number of germs on your skin by washing with CHG (chlorahexidine gluconate) soap before surgery.  CHG is an antiseptic cleaner which kills germs and bonds with the skin to continue killing germs even after washing. Please DO NOT use if you have an allergy to CHG or antibacterial soaps.  If your skin becomes reddened/irritated stop using the CHG and inform your nurse when you arrive at Short Stay. Do not shave (including legs and underarms) for at least 48 hours prior to the first CHG shower.  You may shave your face/neck. Please follow these instructions carefully:  1.  Shower with CHG Soap the night before surgery and the  morning of Surgery.  2.  If you choose to wash your hair, wash your hair first as usual with your  normal  shampoo.  3.  After you shampoo, rinse your hair and body thoroughly to remove the  shampoo.  4.  Use CHG as you would any other liquid soap.  You can apply chg directly  to the skin and wash                       Gently with a scrungie or  clean washcloth.  5.  Apply the CHG Soap to your body ONLY FROM THE NECK DOWN.   Do not use on face/ open                           Wound or open sores. Avoid contact with eyes, ears mouth and genitals (private parts).                       Wash face,  Genitals (private parts) with your normal soap.             6.  Wash thoroughly, paying special attention to the area where your surgery  will be performed.  7.  Thoroughly rinse your body with warm water from the neck down.  8.  DO NOT shower/wash with your normal soap after using and rinsing off  the CHG Soap.                9.  Pat yourself dry with a clean towel.            10.  Wear clean pajamas.            11.  Place clean sheets on your bed the night of your first shower and do not  sleep with pets. Day of Surgery : Do not apply any lotions/deodorants the morning of surgery.  Please wear clean clothes to the hospital/surgery center.  FAILURE TO FOLLOW THESE INSTRUCTIONS MAY RESULT IN THE CANCELLATION OF YOUR SURGERY PATIENT SIGNATURE_________________________________  NURSE SIGNATURE__________________________________  ________________________________________________________________________

## 2015-08-30 NOTE — Progress Notes (Signed)
cmet 08-24-15 epic Lipase 08-23-15 epic Cbc 08-22-15 epic Urine preg 08-21-15 epic ekg 08-21-15 epic

## 2015-08-31 ENCOUNTER — Ambulatory Visit: Payer: Self-pay | Admitting: General Surgery

## 2015-09-02 ENCOUNTER — Encounter (HOSPITAL_COMMUNITY)
Admission: RE | Admit: 2015-09-02 | Discharge: 2015-09-02 | Disposition: A | Discharge status: Home / Self Care | Payer: BLUE CROSS/BLUE SHIELD | Source: Ambulatory Visit | Attending: General Surgery | Admitting: General Surgery

## 2015-09-02 ENCOUNTER — Encounter (HOSPITAL_COMMUNITY): Payer: Self-pay

## 2015-09-02 HISTORY — DX: Biliary acute pancreatitis without necrosis or infection: K85.10

## 2015-09-02 HISTORY — DX: Other specified disorders of kidney and ureter: N28.89

## 2015-09-02 LAB — ABO/RH: ABO/RH(D): A POS

## 2015-09-02 NOTE — Anesthesia Preprocedure Evaluation (Addendum)
Anesthesia Evaluation  Patient identified by MRN, date of birth, ID band Patient awake    Reviewed: Allergy & Precautions, NPO status , Patient's Chart, lab work & pertinent test results  Airway Mallampati: II  TM Distance: >3 FB Neck ROM: Full    Dental  (+) Teeth Intact   Pulmonary shortness of breath, sleep apnea ,    breath sounds clear to auscultation       Cardiovascular negative cardio ROS   Rhythm:Regular Rate:Normal     Neuro/Psych negative neurological ROS     GI/Hepatic negative GI ROS, Neg liver ROS,   Endo/Other  negative endocrine ROS  Renal/GU negative Renal ROS  negative genitourinary   Musculoskeletal  (+) Arthritis ,   Abdominal   Peds negative pediatric ROS (+)  Hematology negative hematology ROS (+)   Anesthesia Other Findings - HLD  Reproductive/Obstetrics negative OB ROS                            Lab Results  Component Value Date   WBC 8.2 08/22/2015   HGB 12.3 08/22/2015   HCT 36.5 08/22/2015   MCV 84.7 08/22/2015   PLT 193 08/22/2015   Lab Results  Component Value Date   CREATININE 0.74 08/24/2015   BUN 6 08/24/2015   NA 141 08/24/2015   K 4.4 08/24/2015   CL 105 08/24/2015   CO2 25 08/24/2015   No results found for: INR, PROTIME  08/22/2015 EKG: normal sinus rhythm.   Anesthesia Physical Anesthesia Plan  ASA: III  Anesthesia Plan: General   Post-op Pain Management:    Induction: Intravenous  Airway Management Planned: Oral ETT  Additional Equipment: Arterial line  Intra-op Plan:   Post-operative Plan: Extubation in OR  Informed Consent: I have reviewed the patients History and Physical, chart, labs and discussed the procedure including the risks, benefits and alternatives for the proposed anesthesia with the patient or authorized representative who has indicated his/her understanding and acceptance.   Dental advisory given  Plan  Discussed with: CRNA  Anesthesia Plan Comments:        Anesthesia Quick Evaluation

## 2015-09-03 ENCOUNTER — Inpatient Hospital Stay (HOSPITAL_COMMUNITY): Payer: BLUE CROSS/BLUE SHIELD

## 2015-09-03 ENCOUNTER — Inpatient Hospital Stay (HOSPITAL_COMMUNITY): Payer: BLUE CROSS/BLUE SHIELD | Admitting: Anesthesiology

## 2015-09-03 ENCOUNTER — Inpatient Hospital Stay (HOSPITAL_COMMUNITY)
Admission: RE | Admit: 2015-09-03 | Discharge: 2015-09-05 | DRG: 657 | Disposition: A | Discharge status: Home / Self Care | Payer: BLUE CROSS/BLUE SHIELD | Source: Ambulatory Visit | Attending: Urology | Admitting: Urology

## 2015-09-03 ENCOUNTER — Encounter (HOSPITAL_COMMUNITY)
Admission: RE | Disposition: A | Discharge status: Home / Self Care | Payer: Self-pay | Source: Ambulatory Visit | Attending: Urology

## 2015-09-03 ENCOUNTER — Encounter (HOSPITAL_COMMUNITY): Payer: Self-pay

## 2015-09-03 DIAGNOSIS — Z9851 Tubal ligation status: Secondary | ICD-10-CM

## 2015-09-03 DIAGNOSIS — E785 Hyperlipidemia, unspecified: Secondary | ICD-10-CM | POA: Diagnosis present

## 2015-09-03 DIAGNOSIS — Z8052 Family history of malignant neoplasm of bladder: Secondary | ICD-10-CM

## 2015-09-03 DIAGNOSIS — N2889 Other specified disorders of kidney and ureter: Secondary | ICD-10-CM | POA: Diagnosis present

## 2015-09-03 DIAGNOSIS — Z419 Encounter for procedure for purposes other than remedying health state, unspecified: Secondary | ICD-10-CM

## 2015-09-03 DIAGNOSIS — Z01812 Encounter for preprocedural laboratory examination: Secondary | ICD-10-CM | POA: Diagnosis not present

## 2015-09-03 DIAGNOSIS — K8010 Calculus of gallbladder with chronic cholecystitis without obstruction: Secondary | ICD-10-CM | POA: Diagnosis present

## 2015-09-03 DIAGNOSIS — C641 Malignant neoplasm of right kidney, except renal pelvis: Secondary | ICD-10-CM | POA: Diagnosis present

## 2015-09-03 HISTORY — PX: CHOLECYSTECTOMY: SHX55

## 2015-09-03 HISTORY — PX: LAPAROSCOPIC NEPHRECTOMY: SHX1930

## 2015-09-03 LAB — HEMOGLOBIN AND HEMATOCRIT, BLOOD
HEMATOCRIT: 36.5 % (ref 36.0–46.0)
Hemoglobin: 11.9 g/dL — ABNORMAL LOW (ref 12.0–15.0)

## 2015-09-03 LAB — COMPREHENSIVE METABOLIC PANEL
ALK PHOS: 50 U/L (ref 38–126)
ALT: 14 U/L (ref 14–54)
AST: 17 U/L (ref 15–41)
Albumin: 4.2 g/dL (ref 3.5–5.0)
Anion gap: 7 (ref 5–15)
BILIRUBIN TOTAL: 1.1 mg/dL (ref 0.3–1.2)
BUN: 17 mg/dL (ref 6–20)
CALCIUM: 9.6 mg/dL (ref 8.9–10.3)
CO2: 27 mmol/L (ref 22–32)
Chloride: 105 mmol/L (ref 101–111)
Creatinine, Ser: 0.83 mg/dL (ref 0.44–1.00)
GFR calc Af Amer: >60 mL/min (ref >60)
Glucose, Bld: 86 mg/dL (ref 65–99)
POTASSIUM: 4.3 mmol/L (ref 3.5–5.1)
Sodium: 139 mmol/L (ref 135–145)
TOTAL PROTEIN: 6.9 g/dL (ref 6.5–8.1)

## 2015-09-03 LAB — TYPE AND SCREEN
ABO/RH(D): A POS
Antibody Screen: NEGATIVE

## 2015-09-03 LAB — URINE CULTURE: CULTURE: NO GROWTH

## 2015-09-03 IMAGING — RF DG CHOLANGIOGRAM OPERATIVE
1 series · 5 of 5 positions shown · non-contrast
Comparison: CT [DATE]

CLINICAL DATA: ruq pain. Pt in LPO position for a radical
nephrectomy. Hx of rt renal mass, gallstones

EXAM:
INTRAOPERATIVE CHOLANGIOGRAM
TECHNIQUE: Cholangiographic images from the C-arm fluoroscopic device were
submitted for interpretation post-operatively. Please see the
procedural report for the amount of contrast and the fluoroscopy
time utilized.

[Series 1: run · 2 acquisitions, 5 frames shown]
[im 1/2]
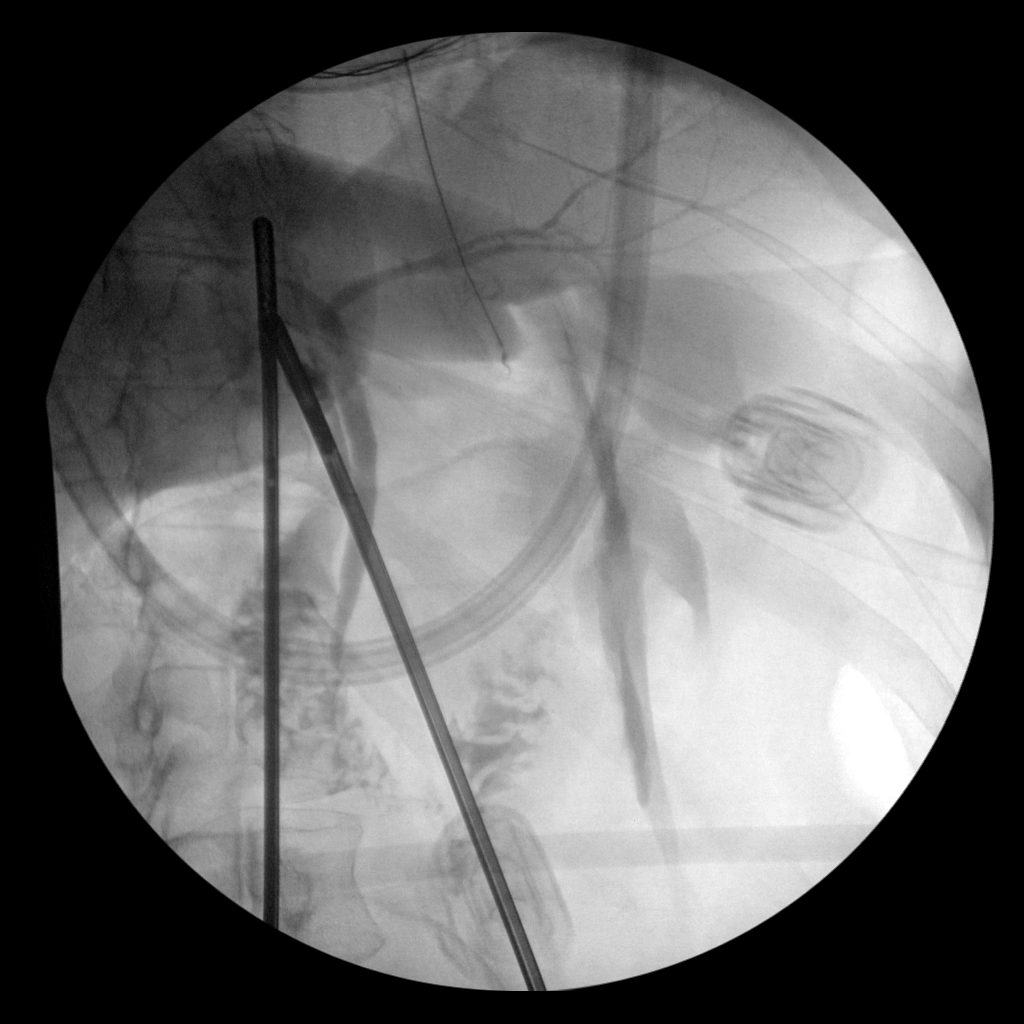
[im 1/2]
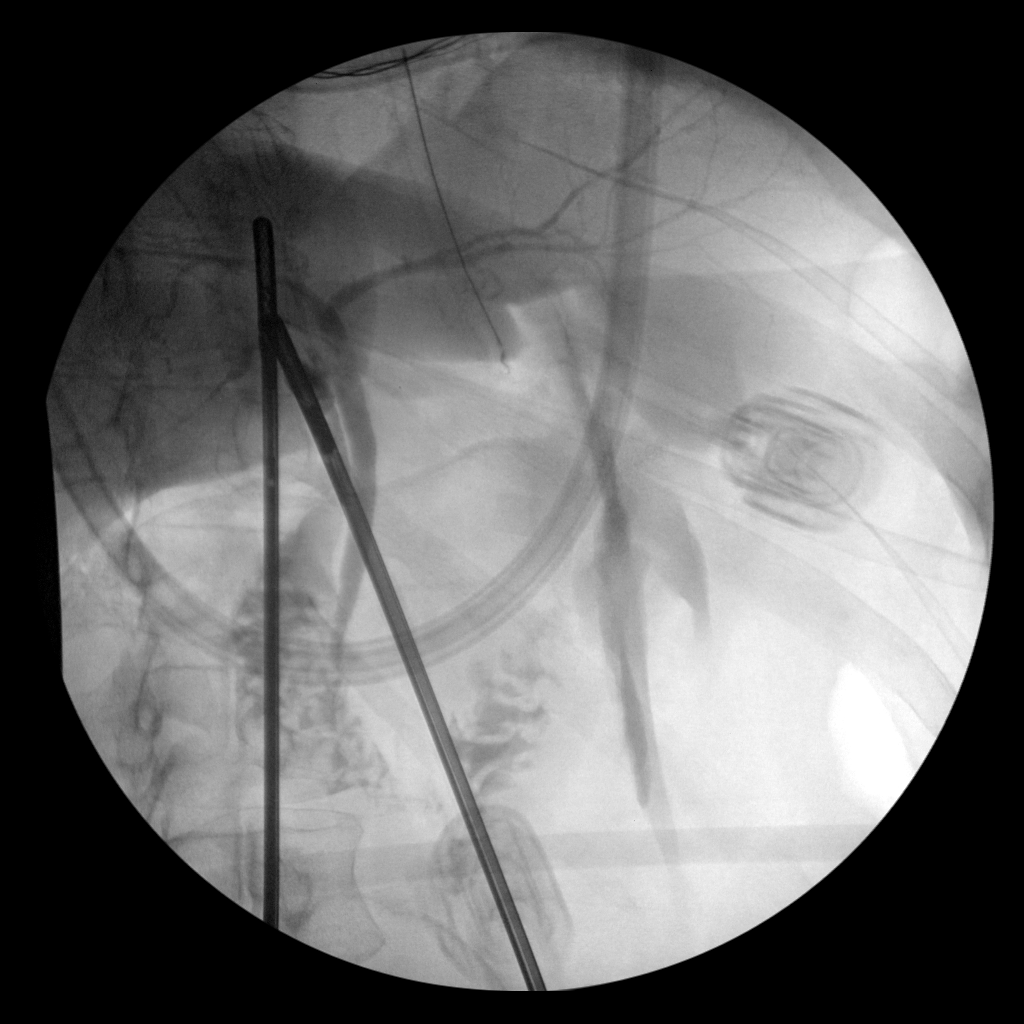
[im 1/2]
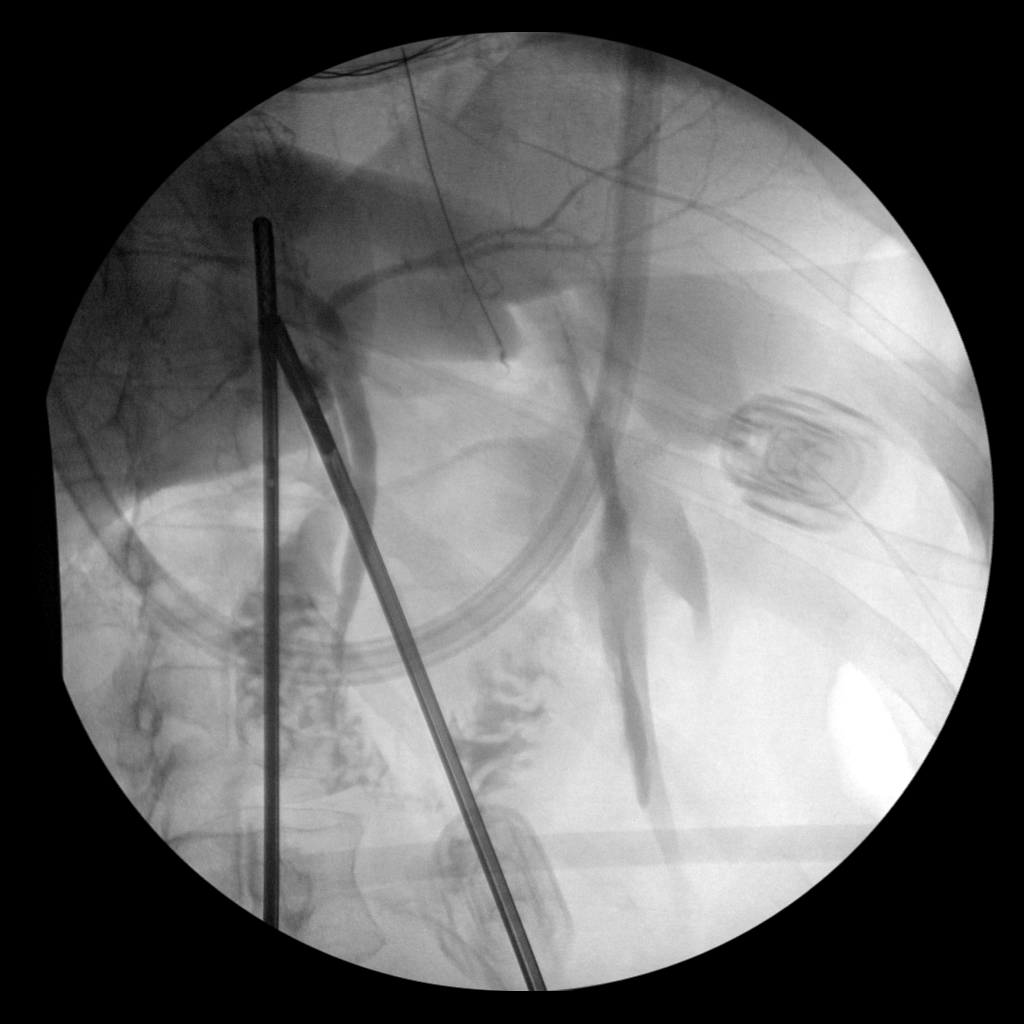
[im 1/2]
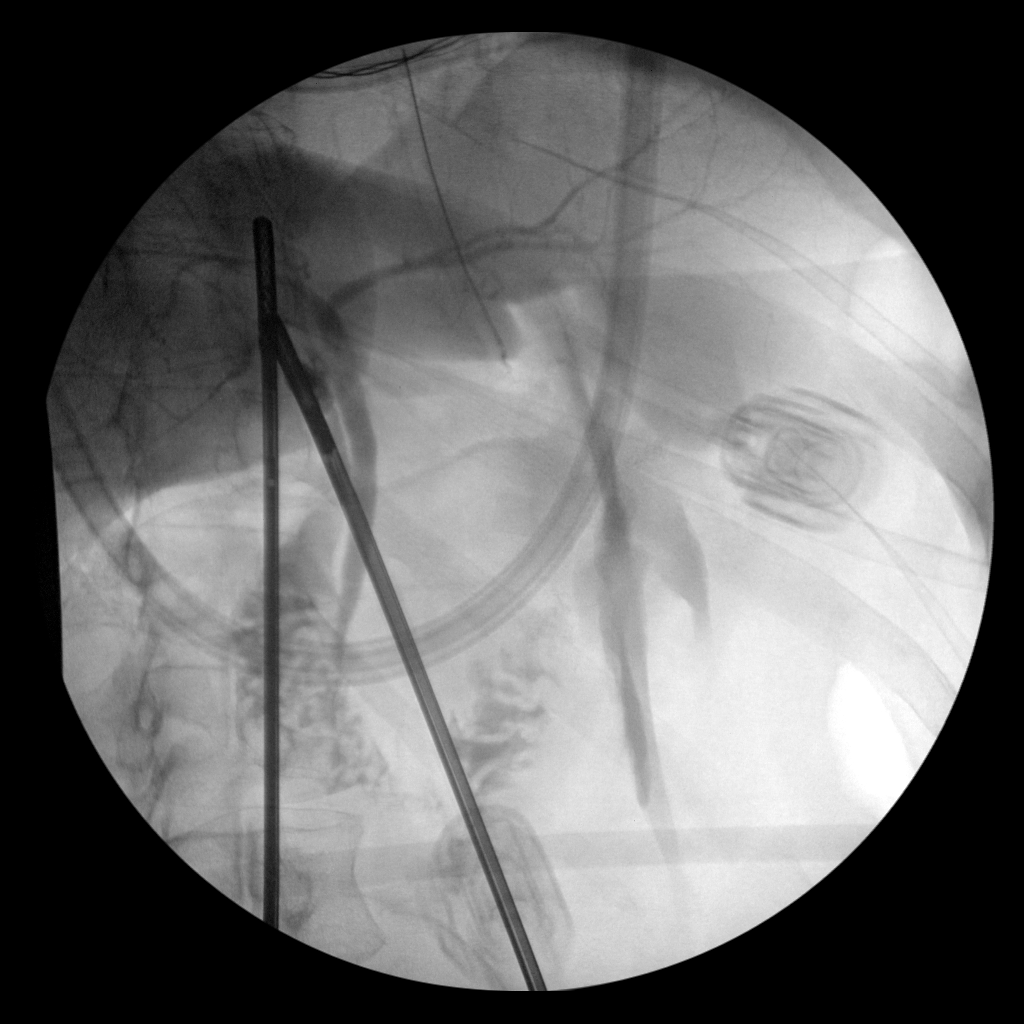
[im 2/2]
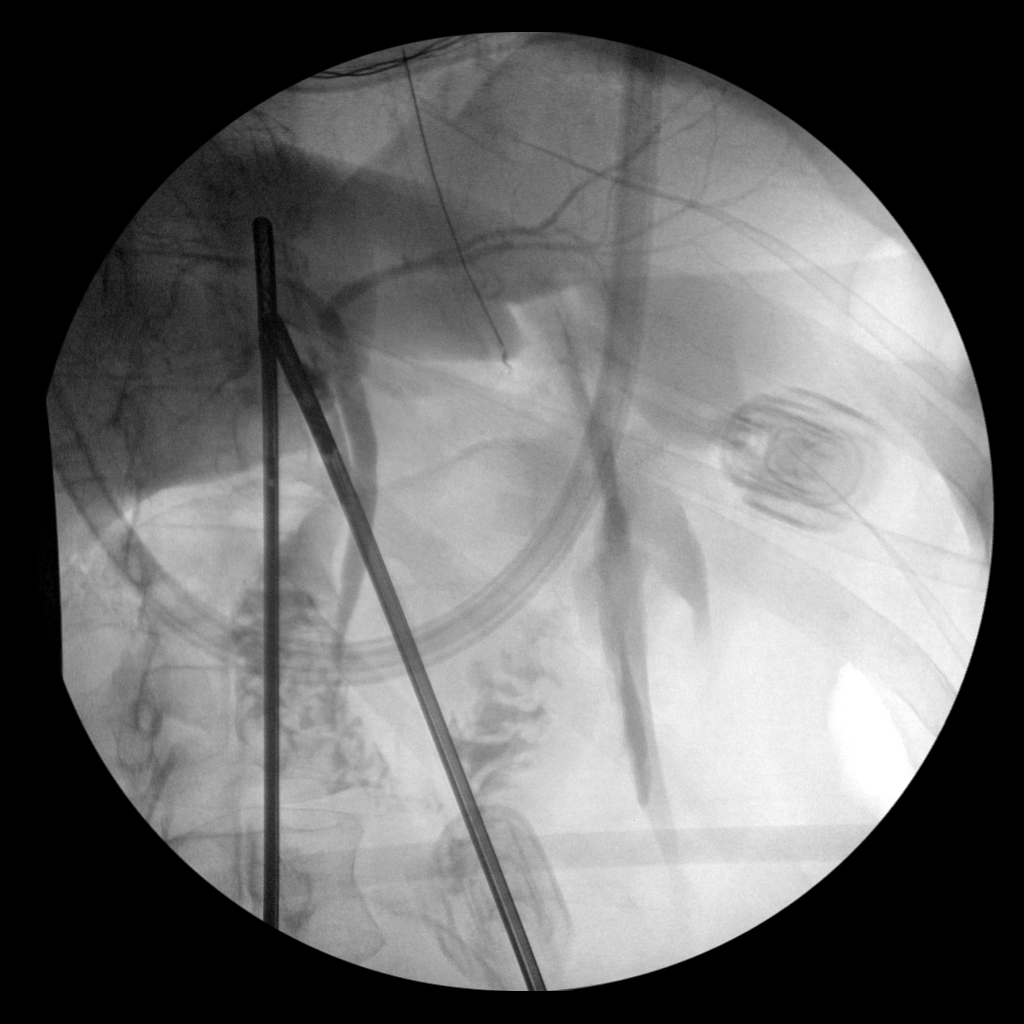

[5 of 5 positions shown; findings below may reference images not displayed]

FINDINGS: No persistent filling defects in the common duct. Intrahepatic ducts
are incompletely visualized, appearing decompressed centrally.
Contrast passes into the duodenum.

:
Negative for retained common duct stone.

## 2015-09-03 SURGERY — LAPAROSCOPIC CHOLECYSTECTOMY WITH INTRAOPERATIVE CHOLANGIOGRAM
Anesthesia: General

## 2015-09-03 MED ORDER — SUGAMMADEX SODIUM 500 MG/5ML IV SOLN
INTRAVENOUS | Status: DC | PRN
  Administered 2015-09-03: 200 mg via INTRAVENOUS

## 2015-09-03 MED ORDER — 0.9 % SODIUM CHLORIDE (POUR BTL) OPTIME
TOPICAL | Status: DC | PRN
  Administered 2015-09-03: 1000 mL

## 2015-09-03 MED ORDER — CEFAZOLIN SODIUM-DEXTROSE 2-3 GM-% IV SOLR: 2.0000 g | INTRAVENOUS | Status: DC

## 2015-09-03 MED ORDER — PHENYLEPHRINE HCL 10 MG/ML IJ SOLN
INTRAMUSCULAR | Status: DC | PRN
  Administered 2015-09-03 (×2): 40 ug via INTRAVENOUS

## 2015-09-03 MED ORDER — ONDANSETRON HCL 4 MG/2ML IJ SOLN
INTRAMUSCULAR | Status: AC
  Filled 2015-09-03: qty 2

## 2015-09-03 MED ORDER — OXYCODONE HCL 5 MG PO TABS
5.0000 mg | ORAL_TABLET | ORAL | Status: DC | PRN
  Administered 2015-09-04 – 2015-09-05 (×7): 5 mg via ORAL
  Filled 2015-09-03 (×8): qty 1

## 2015-09-03 MED ORDER — ONDANSETRON HCL 4 MG/2ML IJ SOLN
INTRAMUSCULAR | Status: DC | PRN
  Administered 2015-09-03: 4 mg via INTRAVENOUS

## 2015-09-03 MED ORDER — LACTATED RINGERS IR SOLN
Status: DC | PRN
  Administered 2015-09-03: 1

## 2015-09-03 MED ORDER — PHENYLEPHRINE 40 MCG/ML (10ML) SYRINGE FOR IV PUSH (FOR BLOOD PRESSURE SUPPORT)
PREFILLED_SYRINGE | INTRAVENOUS | Status: AC
  Filled 2015-09-03: qty 10

## 2015-09-03 MED ORDER — DIPHENHYDRAMINE HCL 12.5 MG/5ML PO ELIX: 12.5000 mg | ORAL_SOLUTION | Freq: Four times a day (QID) | ORAL | Status: DC | PRN

## 2015-09-03 MED ORDER — PROPOFOL 10 MG/ML IV BOLUS
INTRAVENOUS | Status: AC
  Filled 2015-09-03: qty 20

## 2015-09-03 MED ORDER — MIDAZOLAM HCL 2 MG/2ML IJ SOLN
INTRAMUSCULAR | Status: AC
  Filled 2015-09-03: qty 2

## 2015-09-03 MED ORDER — MEPERIDINE HCL 50 MG/ML IJ SOLN
6.2500 mg | INTRAMUSCULAR | Status: DC | PRN
  Administered 2015-09-03 (×2): 12.5 mg via INTRAVENOUS

## 2015-09-03 MED ORDER — DEXTROSE-NACL 5-0.45 % IV SOLN
INTRAVENOUS | Status: DC
Start: 2015-09-03 — End: 2015-09-05
  Administered 2015-09-03 – 2015-09-04 (×3): via INTRAVENOUS

## 2015-09-03 MED ORDER — LIDOCAINE HCL (CARDIAC) 20 MG/ML IV SOLN
INTRAVENOUS | Status: AC
  Filled 2015-09-03: qty 5

## 2015-09-03 MED ORDER — ONDANSETRON HCL 4 MG/2ML IJ SOLN: 4.0000 mg | INTRAMUSCULAR | Status: DC | PRN

## 2015-09-03 MED ORDER — LACTATED RINGERS IV SOLN: INTRAVENOUS | Status: DC

## 2015-09-03 MED ORDER — BUPIVACAINE-EPINEPHRINE 0.25% -1:200000 IJ SOLN
INTRAMUSCULAR | Status: DC | PRN
  Administered 2015-09-03: 11 mL

## 2015-09-03 MED ORDER — FENTANYL CITRATE (PF) 250 MCG/5ML IJ SOLN
INTRAMUSCULAR | Status: AC
  Filled 2015-09-03: qty 5

## 2015-09-03 MED ORDER — PROMETHAZINE HCL 25 MG/ML IJ SOLN: 6.2500 mg | INTRAMUSCULAR | Status: DC | PRN

## 2015-09-03 MED ORDER — ADULT MULTIVITAMIN W/MINERALS CH
1.0000 | ORAL_TABLET | Freq: Every day | ORAL | Status: DC
  Administered 2015-09-04 – 2015-09-05 (×2): 1 via ORAL
  Filled 2015-09-03 (×3): qty 1

## 2015-09-03 MED ORDER — ROCURONIUM BROMIDE 100 MG/10ML IV SOLN
INTRAVENOUS | Status: DC | PRN
  Administered 2015-09-03: 30 mg via INTRAVENOUS
  Administered 2015-09-03: 20 mg via INTRAVENOUS
  Administered 2015-09-03: 50 mg via INTRAVENOUS

## 2015-09-03 MED ORDER — SODIUM CHLORIDE 0.9 % IJ SOLN
INTRAMUSCULAR | Status: DC | PRN
  Administered 2015-09-03: 15 mL

## 2015-09-03 MED ORDER — HYDROMORPHONE HCL 1 MG/ML IJ SOLN
0.2500 mg | INTRAMUSCULAR | Status: DC | PRN
  Administered 2015-09-03 (×4): 0.5 mg via INTRAVENOUS

## 2015-09-03 MED ORDER — ROCURONIUM BROMIDE 100 MG/10ML IV SOLN
INTRAVENOUS | Status: AC
  Filled 2015-09-03: qty 1

## 2015-09-03 MED ORDER — LACTATED RINGERS IV SOLN
INTRAVENOUS | Status: DC | PRN
  Administered 2015-09-03 (×2): via INTRAVENOUS

## 2015-09-03 MED ORDER — DEXAMETHASONE SODIUM PHOSPHATE 10 MG/ML IJ SOLN
INTRAMUSCULAR | Status: DC | PRN
  Administered 2015-09-03: 10 mg via INTRAVENOUS

## 2015-09-03 MED ORDER — ACETAMINOPHEN 10 MG/ML IV SOLN
1000.0000 mg | Freq: Four times a day (QID) | INTRAVENOUS | Status: DC
  Administered 2015-09-03: 1000 mg via INTRAVENOUS

## 2015-09-03 MED ORDER — CEFAZOLIN SODIUM 1-5 GM-% IV SOLN
1.0000 g | Freq: Three times a day (TID) | INTRAVENOUS | Status: AC
  Administered 2015-09-03 – 2015-09-04 (×2): 1 g via INTRAVENOUS
  Filled 2015-09-03 (×2): qty 50

## 2015-09-03 MED ORDER — LIDOCAINE HCL (CARDIAC) 20 MG/ML IV SOLN
INTRAVENOUS | Status: DC | PRN
  Administered 2015-09-03: 60 mg via INTRAVENOUS

## 2015-09-03 MED ORDER — DIPHENHYDRAMINE HCL 50 MG/ML IJ SOLN: 12.5000 mg | Freq: Four times a day (QID) | INTRAMUSCULAR | Status: DC | PRN

## 2015-09-03 MED ORDER — HYDROMORPHONE HCL 1 MG/ML IJ SOLN
INTRAMUSCULAR | Status: AC
  Filled 2015-09-03: qty 1

## 2015-09-03 MED ORDER — CEFAZOLIN SODIUM-DEXTROSE 2-3 GM-% IV SOLR
2.0000 g | INTRAVENOUS | Status: AC
  Administered 2015-09-03: 2 g via INTRAVENOUS

## 2015-09-03 MED ORDER — HYDROCODONE-ACETAMINOPHEN 5-325 MG PO TABS: 1.0000 | ORAL_TABLET | Freq: Four times a day (QID) | ORAL | Status: DC | PRN

## 2015-09-03 MED ORDER — MIDAZOLAM HCL 5 MG/5ML IJ SOLN
INTRAMUSCULAR | Status: DC | PRN
  Administered 2015-09-03: 2 mg via INTRAVENOUS

## 2015-09-03 MED ORDER — HYDROMORPHONE HCL 1 MG/ML IJ SOLN
INTRAMUSCULAR | Status: AC
Start: 2015-09-03 — End: 2015-09-04
  Filled 2015-09-03: qty 1

## 2015-09-03 MED ORDER — HYDROMORPHONE HCL 1 MG/ML IJ SOLN
0.5000 mg | INTRAMUSCULAR | Status: DC | PRN
  Administered 2015-09-03 – 2015-09-04 (×5): 1 mg via INTRAVENOUS
  Filled 2015-09-03 (×5): qty 1

## 2015-09-03 MED ORDER — DOCUSATE SODIUM 100 MG PO CAPS
100.0000 mg | ORAL_CAPSULE | Freq: Two times a day (BID) | ORAL | Status: DC
  Administered 2015-09-03 – 2015-09-05 (×4): 100 mg via ORAL
  Filled 2015-09-03 (×4): qty 1

## 2015-09-03 MED ORDER — MEPERIDINE HCL 50 MG/ML IJ SOLN
INTRAMUSCULAR | Status: AC
  Filled 2015-09-03: qty 1

## 2015-09-03 MED ORDER — PROPOFOL 10 MG/ML IV BOLUS
INTRAVENOUS | Status: DC | PRN
  Administered 2015-09-03: 150 mg via INTRAVENOUS

## 2015-09-03 MED ORDER — FERROUS SULFATE 325 (65 FE) MG PO TABS
325.0000 mg | ORAL_TABLET | Freq: Every day | ORAL | Status: DC
  Administered 2015-09-04 – 2015-09-05 (×2): 325 mg via ORAL
  Filled 2015-09-03 (×2): qty 1

## 2015-09-03 MED ORDER — LACTATED RINGERS IV SOLN
INTRAVENOUS | Status: DC | PRN
  Administered 2015-09-03 (×2): via INTRAVENOUS

## 2015-09-03 MED ORDER — CEFAZOLIN SODIUM-DEXTROSE 2-3 GM-% IV SOLR
INTRAVENOUS | Status: AC
Start: 2015-09-03 — End: 2015-09-03
  Filled 2015-09-03: qty 50

## 2015-09-03 MED ORDER — SUGAMMADEX SODIUM 500 MG/5ML IV SOLN
INTRAVENOUS | Status: AC
  Filled 2015-09-03: qty 5

## 2015-09-03 MED ORDER — DEXAMETHASONE SODIUM PHOSPHATE 10 MG/ML IJ SOLN
INTRAMUSCULAR | Status: AC
  Filled 2015-09-03: qty 1

## 2015-09-03 MED ORDER — BUPIVACAINE-EPINEPHRINE (PF) 0.25% -1:200000 IJ SOLN
INTRAMUSCULAR | Status: AC
Start: 2015-09-03 — End: 2015-09-03
  Filled 2015-09-03: qty 30

## 2015-09-03 MED ORDER — FENTANYL CITRATE (PF) 100 MCG/2ML IJ SOLN
INTRAMUSCULAR | Status: DC | PRN
  Administered 2015-09-03: 100 ug via INTRAVENOUS
  Administered 2015-09-03 (×3): 50 ug via INTRAVENOUS
  Administered 2015-09-03: 100 ug via INTRAVENOUS
  Administered 2015-09-03 (×3): 50 ug via INTRAVENOUS

## 2015-09-03 MED ORDER — SODIUM CHLORIDE 0.9 % IJ SOLN
INTRAMUSCULAR | Status: DC | PRN
  Administered 2015-09-03: 20 mL

## 2015-09-03 MED ORDER — CALCIUM CARBONATE 1250 (500 CA) MG PO TABS
1.0000 | ORAL_TABLET | Freq: Every day | ORAL | Status: DC
  Administered 2015-09-04 – 2015-09-05 (×2): 500 mg via ORAL
  Filled 2015-09-03 (×2): qty 1

## 2015-09-03 MED ORDER — BUPIVACAINE LIPOSOME 1.3 % IJ SUSP
20.0000 mL | Freq: Once | INTRAMUSCULAR | Status: AC
  Administered 2015-09-03: 20 mL
  Filled 2015-09-03: qty 20

## 2015-09-03 MED ORDER — ACETAMINOPHEN 10 MG/ML IV SOLN
INTRAVENOUS | Status: AC
  Filled 2015-09-03: qty 100

## 2015-09-03 SURGICAL SUPPLY — 84 items
APL ESCP 34 STRL LF DISP (HEMOSTASIS)
APL SRG 38 LTWT LNG FL B (MISCELLANEOUS) ×1
APPLICATOR ARISTA FLEXITIP XL (MISCELLANEOUS) ×2 IMPLANT
APPLICATOR SURGIFLO ENDO (HEMOSTASIS) ×1 IMPLANT
APPLIER CLIP 5 13 M/L LIGAMAX5 (MISCELLANEOUS) ×3
APPLIER CLIP ROT 10 11.4 M/L (STAPLE)
APR CLP MED LRG 11.4X10 (STAPLE)
APR CLP MED LRG 5 ANG JAW (MISCELLANEOUS) ×1
BAG SPEC RTRVL 10 TROC 200 (ENDOMECHANICALS) ×1
BAG SPEC THK2 15X12 ZIP CLS (MISCELLANEOUS) ×1
BAG ZIPLOCK 12X15 (MISCELLANEOUS) ×3 IMPLANT
BLADE EXTENDED COATED 6.5IN (ELECTRODE) IMPLANT
BLADE SURG SZ10 CARB STEEL (BLADE) ×3 IMPLANT
CABLE HIGH FREQUENCY MONO STRZ (ELECTRODE) ×3 IMPLANT
CATH CHOLANG 76X19 KUMAR (CATHETERS) ×2 IMPLANT
CHLORAPREP W/TINT 26ML (MISCELLANEOUS) ×4 IMPLANT
CLIP APPLIE 5 13 M/L LIGAMAX5 (MISCELLANEOUS) ×1 IMPLANT
CLIP APPLIE ROT 10 11.4 M/L (STAPLE) IMPLANT
CLIP LIGATING HEM O LOK PURPLE (MISCELLANEOUS) ×5 IMPLANT
CLIP LIGATING HEMO LOK XL GOLD (MISCELLANEOUS) ×4 IMPLANT
CLIP LIGATING HEMO O LOK GREEN (MISCELLANEOUS) ×5 IMPLANT
COVER MAYO STAND STRL (DRAPES) ×3 IMPLANT
COVER SURGICAL LIGHT HANDLE (MISCELLANEOUS) ×4 IMPLANT
CUTTER FLEX LINEAR 45M (STAPLE) ×2 IMPLANT
DECANTER SPIKE VIAL GLASS SM (MISCELLANEOUS) ×4 IMPLANT
DRAIN CHANNEL 10F 3/8 F FF (DRAIN) IMPLANT
DRAIN CHANNEL 19F RND (DRAIN) IMPLANT
DRAPE C-ARM 42X120 X-RAY (DRAPES) ×3 IMPLANT
DRAPE INCISE IOBAN 66X45 STRL (DRAPES) ×3 IMPLANT
DRAPE LAPAROSCOPIC ABDOMINAL (DRAPES) ×3 IMPLANT
DRAPE WARM FLUID 44X44 (DRAPE) IMPLANT
ELECT PENCIL ROCKER SW 15FT (MISCELLANEOUS) ×3 IMPLANT
ELECT REM PT RETURN 9FT ADLT (ELECTROSURGICAL) ×6
ELECTRODE REM PT RTRN 9FT ADLT (ELECTROSURGICAL) ×2 IMPLANT
EVACUATOR SILICONE 100CC (DRAIN) IMPLANT
FLOSEAL 10ML (HEMOSTASIS) IMPLANT
GLOVE BIOGEL M STRL SZ7.5 (GLOVE) ×7 IMPLANT
GLOVE BIOGEL PI IND STRL 7.0 (GLOVE) ×1 IMPLANT
GLOVE BIOGEL PI INDICATOR 7.0 (GLOVE) ×6
GLOVE SURG SS PI 7.0 STRL IVOR (GLOVE) ×5 IMPLANT
GOWN STRL REUS W/TWL LRG LVL3 (GOWN DISPOSABLE) ×9 IMPLANT
GOWN STRL REUS W/TWL XL LVL3 (GOWN DISPOSABLE) ×6 IMPLANT
HEMOSTAT ARISTA ABSORB 3G PWDR (MISCELLANEOUS) ×2 IMPLANT
HEMOSTAT SURGICEL 4X8 (HEMOSTASIS) IMPLANT
KIT BASIN OR (CUSTOM PROCEDURE TRAY) ×4 IMPLANT
LIQUID BAND (GAUZE/BANDAGES/DRESSINGS) ×4 IMPLANT
MANIFOLD NEPTUNE II (INSTRUMENTS) ×3 IMPLANT
MARKER SKIN DUAL TIP RULER LAB (MISCELLANEOUS) ×3 IMPLANT
POSITIONER SURGICAL ARM (MISCELLANEOUS) ×6 IMPLANT
POUCH ENDO CATCH II 15MM (MISCELLANEOUS) ×3 IMPLANT
POUCH RETRIEVAL ECOSAC 10 (ENDOMECHANICALS) ×1 IMPLANT
POUCH RETRIEVAL ECOSAC 10MM (ENDOMECHANICALS) ×2
RELOAD 45 VASCULAR/THIN (ENDOMECHANICALS) ×9 IMPLANT
RELOAD STAPLE 45 2.5 WHT GRN (ENDOMECHANICALS) IMPLANT
RELOAD STAPLE 45 3.5 BLU ETS (ENDOMECHANICALS) IMPLANT
RELOAD STAPLE TA45 3.5 REG BLU (ENDOMECHANICALS) IMPLANT
RETRACTOR LAPSCP 12X46 CVD (ENDOMECHANICALS) IMPLANT
RTRCTR LAPSCP 12X46 CVD (ENDOMECHANICALS)
SCISSORS LAP 5X35 DISP (ENDOMECHANICALS) ×6 IMPLANT
SET IRRIG TUBING LAPAROSCOPIC (IRRIGATION / IRRIGATOR) ×3 IMPLANT
SHEARS HARMONIC ACE PLUS 36CM (ENDOMECHANICALS) ×3 IMPLANT
SLEEVE XCEL OPT CAN 5 100 (ENDOMECHANICALS) ×12 IMPLANT
SPONGE LAP 4X18 X RAY DECT (DISPOSABLE) IMPLANT
SPONGE SURGIFOAM ABS GEL 100 (HEMOSTASIS) IMPLANT
STOPCOCK 4 WAY LG BORE MALE ST (IV SETS) IMPLANT
SUT ETHILON 2 0 PS N (SUTURE) IMPLANT
SUT ETHILON 3 0 PS 1 (SUTURE) IMPLANT
SUT MNCRL AB 4-0 PS2 18 (SUTURE) ×9 IMPLANT
SUT PDS AB 0 CT1 36 (SUTURE) ×6 IMPLANT
SUT VIC AB 2-0 CT1 27 (SUTURE) ×3
SUT VIC AB 2-0 CT1 27XBRD (SUTURE) ×1 IMPLANT
SUT VICRYL 0 ENDOLOOP (SUTURE) IMPLANT
SUT VICRYL 0 UR6 27IN ABS (SUTURE) ×9 IMPLANT
SYRINGE 20CC LL (MISCELLANEOUS) ×4 IMPLANT
SYSTEM CARTER THOMASON II (TROCAR) IMPLANT
TOWEL OR 17X26 10 PK STRL BLUE (TOWEL DISPOSABLE) ×6 IMPLANT
TOWEL OR NON WOVEN STRL DISP B (DISPOSABLE) ×3 IMPLANT
TRAY FOLEY CATH 16FR SILVER (SET/KITS/TRAYS/PACK) ×1 IMPLANT
TRAY FOLEY W/METER SILVER 14FR (SET/KITS/TRAYS/PACK) ×2 IMPLANT
TRAY FOLEY W/METER SILVER 16FR (SET/KITS/TRAYS/PACK) ×1 IMPLANT
TRAY LAPAROSCOPIC (CUSTOM PROCEDURE TRAY) ×6 IMPLANT
TROCAR BLADELESS OPT 5 100 (ENDOMECHANICALS) ×6 IMPLANT
TROCAR XCEL 12X100 BLDLESS (ENDOMECHANICALS) ×8 IMPLANT
YANKAUER SUCT BULB TIP 10FT TU (MISCELLANEOUS) ×3 IMPLANT

## 2015-09-03 NOTE — Anesthesia Procedure Notes (Addendum)
Anesthesia Procedure Note A Line Arrow Pen  Right Radial Arterial Line Placement: Chlorhexidine prep, # 22 Ga. Arrow TW introducer needle inserted into the patient's Right radial artery, wire advanced, and # 20 Ga. 4.45 cm. Arrow catheter advanced over the wire and into the radial artery without difficulty.  The needle and wire were withdrawn and there was good blood flow. The catheter was connected via previously flushed high Durometer tubing to the transducer.  Blood was aspirated from the artery and tubing via the stopcock at the transducer.  The line and catheter was then flushed with NS.  Arterial catheter secured with a StatLock stabilization device.  2.5 cm BioPatch disc and sterile Tegaderm dressing applied.  No complications.  Procedure Name: Intubation Date/Time: 09/03/2015 8:38 AM Performed by: Gaston Islam EVETTE Pre-anesthesia Checklist: Patient identified, Suction available and Patient being monitored Patient Re-evaluated:Patient Re-evaluated prior to inductionOxygen Delivery Method: Circle system utilized Preoxygenation: Pre-oxygenation with 100% oxygen Intubation Type: IV induction Ventilation: Mask ventilation without difficulty and Oral airway inserted - appropriate to patient size Laryngoscope Size: Mac and 3 Grade View: Grade I Tube type: Oral Tube size: 7.5 mm Number of attempts: 1 Airway Equipment and Method: Stylet Placement Confirmation: ETT inserted through vocal cords under direct vision,  positive ETCO2,  CO2 detector and breath sounds checked- equal and bilateral Secured at: 22 cm Tube secured with: Tape

## 2015-09-03 NOTE — Op Note (Signed)
Preoperative diagnosis: gallstone pancreatitis  Postoperative diagnosis: Same   Procedure: laparoscopic cholecystectomy with intraoperative cholangiogram  Surgeon: Gurney Maxin, M.D.  Co-Surgeon:   Anesthesia: Gen.   Indications for procedure: Dana Hanson is a 55 y.o. female with symptoms of Abdominal pain and RUQ pain consistent with gallbladder disease, Confirmed by Ultrasound and CT. During work up the patient was found to have a large right kidney mass. Due to this finding, Dr. Louis Meckel of urology and myself have decided to do a combined case of laparoscopic cholecystectomy and laparoscopic nephrectomy.  Description of procedure: The patient was brought into the operative suite, placed supine. Anesthesia was administered with endotracheal tube. The patient was then placed in a modified right decubitus position utilizing the bean bag and axillary role as well as foam o offload pressure points. Patient was strapped in place and foot board was secured. All pressure points were offloaded by foam padding. The patient was prepped and draped in the usual sterile fashion.  A small incision was made to the right of the umbilicus. A 30mm trocar was inserted into the peritoneal cavity with optical entry. Pneumoperitoneum was applied with high flow low pressure. 1 13mm trocars were placed in the RUQ. A 37mm trocar was placed in the subxiphoid space. 1 66mm trocar was placed in the far right abdomen and 1 66mm trocar was placed in the mid right abdomen. All trocars sites were first anesthesized with 0.25% marcaine with epinephrine in the subcutaneous and preperitoneal layers. Next the patient was placed in reverse trendelenberg. The gallbladder was retracted cephalad and lateral. There were multiple adhesions to the right lobe of the liver to the colon and retroperitoneum. These were taken down with harmonic scalpel. The peritoneum was reflected off the infundibulum working lateral to medial.   The  cystic duct and cystic artery were identified and further dissection revealed a critical view, due to concern for choledocholithiasis a cholangiogram was performed with [place]. Normal ductal anatomy was seen. The cystic duct and cystic artery were doubly clipped and ligated.   The gallbladder was removed off the liver bed with cautery. The Gallbladder was placed in a specimen bag. The gallbladder fossa was irrigated and hemostasis was applied with cautery.   At this time Dr. Louis Meckel performed his portion of the case, please see his operative report for more information.  Findings: normal ductal anatomy  Specimen: gallbldadder  Blood loss: <50cc  Local anesthesia: see Dr. Carlton Adam report  Complications: none  Gurney Maxin, M.D. General, Bariatric, & Minimally Invasive Surgery [facility]

## 2015-09-03 NOTE — Care Management Note (Signed)
Case Management Note  Patient Details  Name: Dana Hanson MRN: [id number] Date of Birth: [birth year]  Subjective/Objective: 55 y/o f admitted w/R renal mass, s/p lap rad nephrectomy.From home.                   Action/Plan:d/c home.   Expected Discharge Date:                  Expected Discharge Plan:  Home/Self Care  In-House Referral:     Discharge planning Services  CM Consult  Post Acute Care Choice:    Choice offered to:     DME Arranged:    DME Agency:     HH Arranged:    HH Agency:     Status of Service:  In process, will continue to follow  Medicare Important Message Given:    Date Medicare IM Given:    Medicare IM give by:    Date Additional Medicare IM Given:    Additional Medicare Important Message give by:     If discussed at [place] of Stay Meetings, dates discussed:    Additional Comments:  Dessa Phi, RN 09/03/2015, 3:22 PM

## 2015-09-03 NOTE — H&P (Signed)
Dana Hanson is an 55 y.o. female.   Chief Complaint: abdominal pain HPI: 55 yo female with recent gallstone pancreatitis had incidental finding of large kidney mass. She presents today from removal of both gallbladder and right kidney. She had one bout of abdominal pain in the past 2 days. She has no nausea or vomiting.  Past Medical History  Diagnosis Date  . Hyperlipidemia   . Gallstone pancreatitis   . Right renal mass     Past Surgical History  Procedure Laterality Date  . Cesarean section      x 2-tubal with last c-section  . Colonoscopy  08/14/2011    Procedure: COLONOSCOPY;  Surgeon: Daneil Dolin, MD;  Location: [facility];  Service: Endoscopy;  Laterality: N/A;  10:00  . Tubal ligation      with last c-section  . Hemorrhoid surgery  11/10/2011    Procedure: HEMORRHOIDECTOMY;  Surgeon: Donato Heinz, MD;  Location: [facility];  Service: General;  Laterality: N/A;  . Laparoscopic gastric sleeve resection  01-2015    Family History  Problem Relation Age of Onset  . Colon cancer Neg Hx   . Liver disease Neg Hx   . Anesthesia problems Neg Hx   . Hypotension Neg Hx   . Malignant hyperthermia Neg Hx   . Pseudochol deficiency Neg Hx    Social History:  reports that she has never smoked. She has never used smokeless tobacco. She reports that she does not drink alcohol or use illicit drugs.  Allergies: No Known Allergies  Medications Prior to Admission  Medication Sig Dispense Refill  . calcium carbonate (OS-CAL - DOSED IN MG OF ELEMENTAL CALCIUM) 1250 (500 Ca) MG tablet Take 1 tablet by mouth daily with breakfast.    . Cyanocobalamin (VITAMIN B-12 PO) Take 1 tablet by mouth daily.    . ferrous sulfate 325 (65 FE) MG tablet Take 325 mg by mouth daily with breakfast.    . Multiple Vitamin (MULITIVITAMIN WITH MINERALS) TABS Take 1 tablet by mouth daily.    . Multiple Vitamins-Minerals (HAIR/SKIN/NAILS) TABS Take 3 tablets by mouth daily.       Results for orders placed  or performed during the hospital encounter of 09/03/15 (from the past 48 hour(s))  Comprehensive metabolic panel     Status: None   Collection Time: 09/03/15  6:25 AM  Result Value Ref Range   Sodium 139 135 - 145 mmol/L   Potassium 4.3 3.5 - 5.1 mmol/L   Chloride 105 101 - 111 mmol/L   CO2 27 22 - 32 mmol/L   Glucose, Bld 86 65 - 99 mg/dL   BUN 17 6 - 20 mg/dL   Creatinine, Ser 0.83 0.44 - 1.00 mg/dL   Calcium 9.6 8.9 - 10.3 mg/dL   Total Protein 6.9 6.5 - 8.1 g/dL   Albumin 4.2 3.5 - 5.0 g/dL   AST 17 15 - 41 U/L   ALT 14 14 - 54 U/L   Alkaline Phosphatase 50 38 - 126 U/L   Total Bilirubin 1.1 0.3 - 1.2 mg/dL   GFR calc non Af Amer >60 >60 mL/min   GFR calc Af Amer >60 >60 mL/min    Comment: (NOTE) The eGFR has been calculated using the CKD EPI equation. This calculation has not been validated in all clinical situations. eGFR's persistently <60 mL/min signify possible Chronic Kidney Disease.    Anion gap 7 5 - 15   No results found.  Review of Systems  Constitutional: Negative for fever and chills.  HENT: Negative for hearing loss.   Eyes: Negative for blurred vision and double vision.  Respiratory: Negative for cough and hemoptysis.   Cardiovascular: Negative for chest pain and palpitations.  Gastrointestinal: Positive for abdominal pain. Negative for nausea and vomiting.  Genitourinary: Negative for dysuria and urgency.  Musculoskeletal: Negative for myalgias and neck pain.  Skin: Negative for itching and rash.  Neurological: Negative for dizziness, tingling and headaches.  Endo/Heme/Allergies: Does not bruise/bleed easily.  Psychiatric/Behavioral: Negative for depression and suicidal ideas.    Blood pressure 120/75, pulse 64, temperature 98.8 F (37.1 C), temperature source Oral, resp. rate 18, last menstrual period 10/27/2013, SpO2 100 %. Physical Exam  Vitals reviewed. Constitutional: She is oriented to person, place, and time. She appears well-developed and  well-nourished.  HENT:  Head: Normocephalic and atraumatic.  Eyes: Conjunctivae and EOM are normal. Pupils are equal, round, and reactive to light.  Neck: Normal range of motion. Neck supple.  Cardiovascular: Normal rate and regular rhythm.   Respiratory: Effort normal and breath sounds normal.  GI: Soft. Bowel sounds are normal. She exhibits no distension. There is no tenderness.  Musculoskeletal: Normal range of motion.  Neurological: She is alert and oriented to person, place, and time.  Skin: Skin is warm and dry.  Psychiatric: She has a normal mood and affect. Her behavior is normal.     Assessment/Plan 55 yo female with gallstone pancreatitis last week and findings of large right kidney mass. -lap chole in combination with right nephrectomy  Mickeal Skinner, MD 09/03/2015, 7:22 AM

## 2015-09-03 NOTE — H&P (Signed)
Reason For Visit Right enhancing renal mass   History of Present Illness 55 year old female who presented to the hospital with fever and back pain. She underwent a renal ultrasound to rule out cholecystitis and she was found to have an incidental renal mass. She was subsequently admitted for gallstone pancreatitis with a lipase of 3000. Over the course of several days her pain improved and her pancreatitis resolved. While she was in the hospital she underwent a CT scan with contrast demonstrating a 7.5 cm enhancing renal mass in the lower pole of the right kidney. The patient denies any hematuria. She denies any flank pain. Currently, she is asymptomatic. She has not had any night sweats, weight loss, fever/chills. She has no family history of kidney cancer. Her father was diagnosed with bladder cancer, he is been in remission for now 10 years.  The patient had a gastric sleeve procedure performed in July 2016. Otherwise, she's had no abdominal surgeries. She is otherwise quite healthy.  The patient is divorced, has 2 teenage children. She is here with her father today. She is a nonsmoker.   Past Medical History Problems  1. History of gallstones (Z87.19) 2. History of hyperlipidemia (Z86.39) 3. History of pancreatitis (Z87.19)  Surgical History Problems  1. History of Cesarean Section 2. History of Hemorrhoidectomy 3. History of Tubal Ligation  Current Meds 1. Acetaminophen TABS;  Therapy: (Recorded:07Feb2017) to Recorded 2. Calcium Carbonate TABS;  Therapy: (Recorded:07Feb2017) to Recorded 3. Daily Multiple Vitamins TABS;  Therapy: (Recorded:07Feb2017) to Recorded 4. Ibuprofen 200 MG Oral Tablet;  Therapy: (Recorded:07Feb2017) to Recorded  Allergies Medication  1. No Known Drug Allergies  Social History Problems    Alcohol use (Z78.9)   Rarely   Never a smoker  Review of Systems Genitourinary, constitutional, skin, eye, otolaryngeal, hematologic/lymphatic,  cardiovascular, pulmonary, endocrine, musculoskeletal, gastrointestinal, neurological and psychiatric system(s) were reviewed and pertinent findings if present are noted and are otherwise negative.    Vitals Vital Signs [Data Includes: Last 1 Day]  Recorded: [id number] 02:51PM  Height: 5 ft 7 in Weight: 169 lb  BMI Calculated: 26.47 BSA Calculated: 1.88 Blood Pressure: 104 / 71 Temperature: 97.7 F Heart Rate: 70  Physical Exam Constitutional: Well nourished and well developed . No acute distress.  ENT:. The ears and nose are normal in appearance.  Neck: The appearance of the neck is normal and no neck mass is present.  Pulmonary: No respiratory distress and normal respiratory rhythm and effort.  Cardiovascular: Heart rate and rhythm are normal . No peripheral edema.  Abdomen: The abdomen is soft and nontender. No masses are palpated. No CVA tenderness. No hernias are palpable. No hepatosplenomegaly noted.  Genitourinary:  The bladder is normal on palpation and non tender.  Lymphatics: The femoral and inguinal nodes are not enlarged or tender.  Skin: Normal skin turgor, no visible rash and no visible skin lesions.  Neuro/Psych:. Mood and affect are appropriate.    Results/Data I have independently reviewed the patient's CT scan which demonstrates a 7.5 cm enhancing right lower pole renal mass concerning for RCC. There appears to be no renal vein involvement. There is one artery and early branch and one vein.     Assessment Assessed  1. 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 I went over this operation in detail with the patient  including the risks and benefits. We discussed port position and I outlined the position of the 4 planned trocars. I  also explained to him that he will need extraction incision which typically is in the left lower quadrant. I discussed the actual surgery with him and we went over the various structures better intimate association with the kidney and the risk of damage thereof. I outlined the risk of injury to the major nerves and vessels in proximity to the kidney. I explained the patient the expected hospital course. I told her that she should plan to be in the hospital at least 2-3 days. Further, I told her that he would likely need approximately 1 month to fully recover.   We will correlate this procedure with General surgery so that the patient can have a cholecystectomy performed at the same time under one anesthesia. Tentatively, we have gotten is scheduled for Friday, February 17.

## 2015-09-03 NOTE — Anesthesia Postprocedure Evaluation (Signed)
Anesthesia Post Note  Patient: Dana Hanson  Procedure(s) Performed: Procedure(s) (LRB): LAPAROSCOPIC CHOLECYSTECTOMY WITH INTRAOPERATIVE CHOLANGIOGRAM (N/A) LAPAROSCOPIC RADICAL NEPHRECTOMY (N/A)  Patient location during evaluation: PACU Anesthesia Type: General Level of consciousness: awake and alert Pain management: pain level controlled Vital Signs Assessment: post-procedure vital signs reviewed and stable Respiratory status: spontaneous breathing, nonlabored ventilation, respiratory function stable and patient connected to nasal cannula oxygen Cardiovascular status: blood pressure returned to baseline and stable Postop Assessment: no signs of nausea or vomiting Anesthetic complications: no    Last Vitals:  Filed Vitals:   09/03/15 1215 09/03/15 1230  BP: 149/77 150/74  Pulse: 78 71  Temp:    Resp: 18 14    Last Pain:  Filed Vitals:   09/03/15 1244  PainSc: [place] Violia Knopf

## 2015-09-03 NOTE — Interval H&P Note (Signed)
History and Physical Interval Note:  09/03/2015 7:22 AM  Dana Hanson  has presented today for surgery, with the diagnosis of GALLSTONE PANCREATITIS, RENAL MASS  The various methods of treatment have been discussed with the patient and family. After consideration of risks, benefits and other options for treatment, the patient has consented to  Procedure(s): LAPAROSCOPIC CHOLECYSTECTOMY WITH INTRAOPERATIVE CHOLANGIOGRAM (N/A) LAPAROSCOPIC RADICAL NEPHRECTOMY (N/A) as a surgical intervention .  The patient's history has been reviewed, patient examined, no change in status, stable for surgery.  I have reviewed the patient's chart and labs.  Questions were answered to the patient's satisfaction.     Louis Meckel W

## 2015-09-03 NOTE — Transfer of Care (Signed)
Immediate Anesthesia Transfer of Care Note  Patient: Dana Hanson  Procedure(s) Performed: Procedure(s): LAPAROSCOPIC CHOLECYSTECTOMY WITH INTRAOPERATIVE CHOLANGIOGRAM (N/A) LAPAROSCOPIC RADICAL NEPHRECTOMY (N/A)  Patient Location: PACU  Anesthesia Type:General  Level of Consciousness: awake and alert   Airway & Oxygen Therapy: Patient Spontanous Breathing and Patient connected to face mask oxygen  Post-op Assessment: Report given to RN  Post vital signs: stable  Last Vitals:  Filed Vitals:   09/03/15 0538  BP: 120/75  Pulse: 64  Temp: 37.1 C  Resp: 18    Complications: No apparent anesthesia complications

## 2015-09-03 NOTE — Op Note (Signed)
Preoperative diagnosis:  1. Right renal mass   Postoperative diagnosis:  1. same   Procedure: 1. Laparoscopic right radical nephrectomy  Surgeon: Ardis Hughs, MD Resident Assistant: Pietro Cassis. Ottis Stain First Assistant: None  Anesthesia: General  Complications: None  Intraoperative findings:  -Large lower pole mass -Single artery, single vein renovascular anatomy taken with consecutive staple-fires -Adrenal taken -Gonadal spared  EBL: 25 mL  Specimens:  Right kidney and proximal ureter  Indication: 6yF patient with history of gallstone pancreatitis and incidentally found large RIGHT renal mass.  After reviewing the management options for treatment, he elected to proceed with the above surgical procedure(s). She is being taken to the operating room in conjunction with general surgery for a combination laparoscopic RIGHT nephrectomy and cholecystectomy with intra-operative cholangiogram  We have discussed the potential benefits and risks of the procedure, side effects of the proposed treatment, the likelihood of the patient achieving the goals of the procedure, and any potential problems that might occur during the procedure or recuperation. Informed consent has been obtained.  Description of procedure:  A site was selected lateral to the umbilicus for placement of the camera port. This was placed using a standard open Hassan technique which allowed entry into the peritoneal cavity under direct vision and without difficulty. A 12 mm Hassan cannula was placed and a pneumoperitoneum established. The camera was then used to inspect the abdomen and there was no evidence of any intra-abdominal injuries or other abnormalities. The remaining abdominal ports were then placed under visual guidance.  A second 12 mm port was placed in the right lower quadrant approximately 8 cm away from the camera. A 5 mm port was placed in the right upper quadrant again 8 cm away from the camera. A second 5 mm  port was placed just inferior to the xiphoid process in the midline, this was used as a liver retractor.  An additional 5 mm port was then placed in the right lower quadrant at the anterior axillary line lateral to the 12 mm port. All ports were placed under direct vision without difficulty. Ports were placed in conjunction with Dr. Kieth Brightly.  The case was then turned over to Dr. Kieth Brightly who performed a laparoscopic cholecystectomy with intra-operative cholangiogram. This procedure was uncomplicated and was dictated separately by Dr. Kieth Brightly.   The white line of Toldt was incised allowing the colon to be mobilized medially and the plane between the mesocolon and the anterior layer of Gerota's fascia to be developed and the kidney exposed. The ureter and gonadal vein were identified inferiorly and the ureter was lifted anteriorly off the psoas muscle. Dissection proceeded superiorly along the gonadal vein until the renal vein was identified. The renal hilum was then carefully isolated with a combination of blunt and sharp dissectiong allowing the renal arterial and venous structures to be separated and isolated.   The renal artery and vein were isolated, and consecutively ligated and divided with a 45 mm Flex ETS stapler. Gerota's fascia was intentionally entered superiorly and we dissected superior to the adrenal gland to as to remove it. The hepatorenal ligaments were divided.. The lateral and posterior attachements to the kidney were then divided. Once the kidney was free from its attachments  40cc of 0.25% rupivocaine was then injected into the right anterior axillary line b/w the iliac crest and the twelfth rib under laparoscopic guidance. The layer between the tranversus abdominus and the internal oblique was targeted.   The kidney/ureter specimen was then placed into a  15 mm Endocatch II retrieval bag. The renal hilum, liver, adrenal bed and gonadal vein areas were each inspected and hemostasis  was ensured with the pneomperitoneal pressures lowered. Surgicel was then placed over the hilum.  The camera was then brought to the 12 mm port laterally and the camera port was removed and the fascia closed using a [place] needle with a 0 Vicryl.  All the ports were then removed under visual guidance. The lateral 12 mm port and 5 mm port were then connected sharply with a 15 blade. Then opened this incision down to the external oblique fascia. We then spread the muscle fibers down to the internal oblique fascia which we then opened with cautery. These were then spread in all muscle spared and the posterior peritoneum was opened. The rectus muscle was pulled medially. The specimen was then removed through these incision. The internal oblique fascia was then closed with a 0 Vicryl in a running fashion. The external oblique fascia was then closed with a 0 PDS in a running fashion.  All incisions were injected with local anesthetic and reapproximated at the skin with 4-0 monocryl sutures. Dermabond was applied to the skin. The patient tolerated the procedure well and without complications and was transferred to the recovery unit in satisfactory condition.   The patient will be admitted to the urology service for routine post-surgical care

## 2015-09-03 NOTE — Progress Notes (Signed)
Post-op note  Subjective: The patient is doing well.  No complaints.  Objective: Vital signs in last 24 hours: Temp:  [96.8 F (36 C)-98.8 F (37.1 C)] 97.5 F (36.4 C) (02/17 1331) Pulse Rate:  [64-91] 75 (02/17 1331) Resp:  [12-21] 18 (02/17 1331) BP: (120-156)/(74-104) 156/84 mmHg (02/17 1331) SpO2:  [100 %] 100 % (02/17 1331) Arterial Line BP: (151-167)/(76-83) 167/83 mmHg (02/17 1155)  Intake/Output from previous day:   Intake/Output this shift: Total I/O In: 2800 [I.V.:2800] Out: 525 [Urine:375; Blood:150]  Physical Exam:  General: Alert and oriented. Abdomen: Soft, Nondistended. Incisions: Clean and dry. Foley: urine clear Lab Results:  Recent Labs  09/03/15 1148  HGB 11.9*  HCT 36.5    Assessment/Plan: POD#0 s/p laparoscopic RIGHT radical nephrectomy and RIGHT cholecystectomy. Currently stable.  1) Continue to monitor    LOS: 0 days   Star Age 09/03/2015, 4:28 PM

## 2015-09-04 LAB — BASIC METABOLIC PANEL
ANION GAP: 10 (ref 5–15)
Anion gap: 11 (ref 5–15)
BUN: 12 mg/dL (ref 6–20)
BUN: 10 mg/dL (ref 6–20)
CALCIUM: 9.6 mg/dL (ref 8.9–10.3)
CALCIUM: 9.4 mg/dL (ref 8.9–10.3)
CHLORIDE: 104 mmol/L (ref 101–111)
CO2: 26 mmol/L (ref 22–32)
CO2: 24 mmol/L (ref 22–32)
CREATININE: 1.08 mg/dL — AB (ref 0.44–1.00)
Chloride: 106 mmol/L (ref 101–111)
Creatinine, Ser: 1.24 mg/dL — ABNORMAL HIGH (ref 0.44–1.00)
GFR calc Af Amer: >60 mL/min (ref >60)
GFR calc non Af Amer: 58 mL/min — ABNORMAL LOW (ref >60)
GFR, EST AFRICAN AMERICAN: 56 mL/min — AB (ref >60)
GFR, EST NON AFRICAN AMERICAN: 49 mL/min — AB (ref >60)
GLUCOSE: 159 mg/dL — AB (ref 65–99)
Glucose, Bld: 108 mg/dL — ABNORMAL HIGH (ref 65–99)
POTASSIUM: 4.4 mmol/L (ref 3.5–5.1)
Potassium: 4.3 mmol/L (ref 3.5–5.1)
SODIUM: 142 mmol/L (ref 135–145)
SODIUM: 139 mmol/L (ref 135–145)

## 2015-09-04 LAB — HEMOGLOBIN AND HEMATOCRIT, BLOOD
HCT: 37.5 % (ref 36.0–46.0)
HEMOGLOBIN: 12.2 g/dL (ref 12.0–15.0)

## 2015-09-04 MED ORDER — HEPARIN SODIUM (PORCINE) 5000 UNIT/ML IJ SOLN
5000.0000 [IU] | Freq: Three times a day (TID) | INTRAMUSCULAR | Status: DC
  Administered 2015-09-04 – 2015-09-05 (×3): 5000 [IU] via SUBCUTANEOUS
  Filled 2015-09-04 (×4): qty 1

## 2015-09-04 MED ORDER — SODIUM CHLORIDE 0.45 % IV SOLN
INTRAVENOUS | Status: DC
  Administered 2015-09-04 – 2015-09-05 (×2): via INTRAVENOUS

## 2015-09-04 MED ORDER — ACETAMINOPHEN 325 MG PO TABS
650.0000 mg | ORAL_TABLET | ORAL | Status: DC | PRN
  Administered 2015-09-04 – 2015-09-05 (×2): 650 mg via ORAL
  Filled 2015-09-04 (×2): qty 2

## 2015-09-04 NOTE — Discharge Instructions (Signed)
1- Drain Sites - You may have some mild persistent drainage from old drain site for several days, this is normal. This can be covered with cotton gauze for convenience.  2 - Stiches - Your stitches are all dissolvable. You may notice a "loose thread" at your incisions, these are normal and require no intervention. You may cut them flush to the skin with fingernail clippers if needed for comfort.  3 - Diet - No restrictions  4 - Activity - No heavy lifting / straining (any activities that require valsalva or "bearing down") x 4 weeks. Otherwise, no restrictions.  5 - Bathing - You may shower immediately. Do not take a bath or get into swimming pool where incision sites are submersed in water x 4 weeks.   6 - When to Call the Doctor - Call MD for any fever >102, any acute wound problems, or any severe nausea / vomiting. You can call the [facility] [phone]) 24 hours a day 365 days a year. It will roll-over to the answering service and on-call physician after hours.  [facility] LAPAROSCOPIC [facility]: POST OP INSTRUCTIONS Always review your discharge instruction sheet given to you by the facility where your surgery was performed.  1. A prescription for pain medication may be given to you upon discharge.  Take your pain medication as prescribed, if needed.  If narcotic pain medicine is not needed, then you may take acetaminophen (Tylenol) or ibuprofen (Advil) as needed. 2. Take your usually prescribed medications unless otherwise directed. 3. If you need a refill on your pain medication, please contact your pharmacy.  They will contact our office to request authorization. Prescriptions will not be filled after 5pm or on week-ends. 4. You should follow a light diet the first few days after arrival home, such as soup and crackers, etc.  Be sure to include lots of fluids daily. 5. Most patients will experience some swelling and bruising in the area of the  incisions.  Ice packs will help.  Swelling and bruising can take several days to resolve.  6. It is common to experience some constipation if taking pain medication after surgery.  Increasing fluid intake and taking a stool softener (such as Colace) will usually help or prevent this problem from occurring.  A mild laxative (Milk of Magnesia or Miralax) should be taken according to package instructions if there are no bowel movements after 48 hours. 7. Unless discharge instructions indicate otherwise, you may remove your bandages 24-48 hours after surgery, and you may shower at that time.  You may have steri-strips (small skin tapes) in place directly over the incision.  These strips should be left on the skin for 7-10 days.  If your surgeon used skin glue on the incision, you may shower in 24 hours.  The glue will flake off over the next 2-3 weeks.  Any sutures or staples will be removed at the office during your follow-up visit. 8. ACTIVITIES:  You may resume regular (light) daily activities beginning the next day--such as daily self-care, walking, climbing stairs--gradually increasing activities as tolerated.  You may have sexual intercourse when it is comfortable.  Refrain from any heavy lifting or straining until approved by your doctor. a. You may drive when you are no longer taking prescription pain medication, you can comfortably wear a seatbelt, and you can safely maneuver your car and apply brakes. 9. You should see your doctor in the office for a follow-up appointment approximately 2-3 weeks  after your surgery.  Make sure that you call for this appointment within a day or two after you arrive home to insure a convenient appointment time. 10. OTHER INSTRUCTIONS:  WHEN TO CALL YOUR DOCTOR: 1. Fever over 101.0 2. Inability to urinate 3. Continued bleeding from incision. 4. Increased pain, redness, or drainage from the incision. 5. Increasing abdominal pain  The clinic staff is available to  answer your questions during regular business hours.  Please dont hesitate to call and ask to speak to one of the nurses for clinical concerns.  If you have a medical emergency, go to the nearest emergency room or call 911.  A surgeon from [facility] is always on call at the hospital. [address] [place], [place], [place]  [address] ? [address], [place], [place]   [address] [phone] ? [phone] ? FAX [phone] Web site: [email or web address]

## 2015-09-04 NOTE — Progress Notes (Signed)
Urology Progress Note  1 Day Post-Op   Subjective:AF VSS. Post combination Lap cholecystectomy and Right radical nephrectomy.      No acute urologic events overnight. Ambulation:   negative Flatus:    positive Bowel movement  negative  Pain: no relief  Objective:  Blood pressure 119/64, pulse 70, temperature 98.5 F (36.9 C), temperature source Oral, resp. rate 20, height _0  (1.702 m), weight 76.65 kg (168 lb 15.7 oz), last menstrual period 10/27/2013, SpO2 98 %.  Physical Exam:  General:  No acute distress, awake Extremities: extremities normal, atraumatic, no cyanosis or edema Genitourinary:  Incisions look good Foley: remains.     I/O last 3 completed shifts: In: 2800 [I.V.:2800] Out: 4025 [Urine:3875; Blood:150]  Recent Labs     09/03/15  1148  09/04/15  0550  HGB  11.9*  12.2    Recent Labs     09/03/15  0625  09/04/15  0550  NA  139  142  K  4.3  4.4  CL  105  106  CO2  27  26  BUN  17  12  CREATININE  0.83  1.24*  CALCIUM  9.6  9.6  GFRNONAA  >60  49*  GFRAA  >60  56*     No results for input(s): INR, APTT in the last 72 hours.  Invalid input(s): PT   Invalid input(s): ABG  Assessment/Plan:  Wound care discussed.     Aggressive walking today.     Attention to Cr elevation. ( decrease IV , re-ck Cr, re-ck B-met)

## 2015-09-04 NOTE — Progress Notes (Signed)
1 Day Post-Op  Subjective: Having a fair amount of discomfort - mainly gas pain she states. No n/v. Ambulated several times overnight to try to help with gas pain in upper chest/neck area. Foley out this am and has voided. Cr up slightly this am  Objective: Vital signs in last 24 hours: Temp:  [96.8 F (36 C)-100.3 F (37.9 C)] 98.5 F (36.9 C) (02/18 0500) Pulse Rate:  [65-91] 70 (02/18 0500) Resp:  [12-21] 20 (02/18 0500) BP: (111-156)/(57-104) 119/64 mmHg (02/18 0500) SpO2:  [98 %-100 %] 98 % (02/18 0500) Arterial Line BP: (151-167)/(76-83) 167/83 mmHg (02/17 1155) Weight:  [76.65 kg (168 lb 15.7 oz)] 76.65 kg (168 lb 15.7 oz) (02/17 1700) Last BM Date: 09/02/15  Intake/Output from previous day: 02/17 0701 - 02/18 0700 In: 2800 [I.V.:2800] Out: 4025 [Urine:3875; Blood:150] Intake/Output this shift: Total I/O In: 600 [P.O.:600] Out: -   Appears a little uncomfortable but nontoxic cta b/l Reg Soft, nd, incisions c/d/i; approp TTP, no rt No edema  Lab Results:   Recent Labs  09/03/15 1148 09/04/15 0550  HGB 11.9* 12.2  HCT 36.5 37.5   BMET  Recent Labs  09/03/15 0625 09/04/15 0550  NA 139 142  K 4.3 4.4  CL 105 106  CO2 27 26  GLUCOSE 86 159*  BUN 17 12  CREATININE 0.83 1.24*  CALCIUM 9.6 9.6   PT/INR No results for input(s): LABPROT, INR in the last 72 hours. ABG No results for input(s): PHART, HCO3 in the last 72 hours.  Invalid input(s): PCO2, PO2  Studies/Results: Dg Cholangiogram Operative  09/03/2015  CLINICAL DATA:  ruq pain. Pt in LPO position for a radical nephrectomy. Hx of rt renal mass, gallstones EXAM: INTRAOPERATIVE CHOLANGIOGRAM TECHNIQUE: Cholangiographic images from the C-arm fluoroscopic device were submitted for interpretation post-operatively. Please see the procedural report for the amount of contrast and the fluoroscopy time utilized. COMPARISON:  CT 08/22/2015 FINDINGS: No persistent filling defects in the common duct.  Intrahepatic ducts are incompletely visualized, appearing decompressed centrally. Contrast passes into the duodenum. : Negative for retained common duct stone. Electronically Signed   By: Lucrezia Europe M.D.   On: 09/03/2015 09:12    Anti-infectives: Anti-infectives    Start     Dose/Rate Route Frequency Ordered Stop   09/03/15 1600  ceFAZolin (ANCEF) IVPB 1 g/50 mL premix     1 g 100 mL/hr over 30 Minutes Intravenous Every 8 hours 09/03/15 1344 09/04/15 0148   09/03/15 0604  ceFAZolin (ANCEF) IVPB 2 g/50 mL premix  Status:  Discontinued     2 g 100 mL/hr over 30 Minutes Intravenous On call to O.R. 09/03/15 0604 09/03/15 0606   09/03/15 0603  ceFAZolin (ANCEF) IVPB 2 g/50 mL premix     2 g 100 mL/hr over 30 Minutes Intravenous 30 min pre-op 09/03/15 0603 09/03/15 0727      Assessment/Plan: s/p Procedure(s): LAPAROSCOPIC CHOLECYSTECTOMY WITH INTRAOPERATIVE CHOLANGIOGRAM (N/A) LAPAROSCOPIC RADICAL NEPHRECTOMY (N/A)  Doing ok from GB perspective Has expected gas sensation May not be ready for dc today but will defer to urology Cr up slightly - defer to urology Pain control, ambulation, pulm toilet, if stays consider chemical vte prophylaxis   Leighton Ruff. Redmond Pulling, MD, FACS General, Bariatric, & Minimally Invasive Surgery [facility], [place]   LOS: 1 day    [name] 09/04/2015

## 2015-09-05 LAB — BASIC METABOLIC PANEL
Anion gap: 9 (ref 5–15)
BUN: 11 mg/dL (ref 6–20)
CHLORIDE: 102 mmol/L (ref 101–111)
CO2: 25 mmol/L (ref 22–32)
Calcium: 9.2 mg/dL (ref 8.9–10.3)
Creatinine, Ser: 0.93 mg/dL (ref 0.44–1.00)
GFR calc non Af Amer: >60 mL/min (ref >60)
Glucose, Bld: 105 mg/dL — ABNORMAL HIGH (ref 65–99)
POTASSIUM: 4.4 mmol/L (ref 3.5–5.1)
SODIUM: 136 mmol/L (ref 135–145)

## 2015-09-05 LAB — CBC WITH DIFFERENTIAL/PLATELET
Basophils Absolute: 0 10*3/uL (ref 0.0–0.1)
Basophils Relative: 0 %
EOS ABS: 0 10*3/uL (ref 0.0–0.7)
Eosinophils Relative: 0 %
HEMATOCRIT: 38.1 % (ref 36.0–46.0)
HEMOGLOBIN: 12.4 g/dL (ref 12.0–15.0)
LYMPHS ABS: 1.7 10*3/uL (ref 0.7–4.0)
Lymphocytes Relative: 16 %
MCH: 28.4 pg (ref 26.0–34.0)
MCHC: 32.5 g/dL (ref 30.0–36.0)
MCV: 87.2 fL (ref 78.0–100.0)
MONOS PCT: 8 %
Monocytes Absolute: 0.8 10*3/uL (ref 0.1–1.0)
NEUTROS ABS: 8.2 10*3/uL — AB (ref 1.7–7.7)
NEUTROS PCT: 76 %
Platelets: 190 10*3/uL (ref 150–400)
RBC: 4.37 MIL/uL (ref 3.87–5.11)
RDW: 13.7 % (ref 11.5–15.5)
WBC: 10.7 10*3/uL — AB (ref 4.0–10.5)

## 2015-09-05 NOTE — Progress Notes (Signed)
2 Days Post-Op  Subjective: Doing much better. Ambulating easier. No n/v. Pain better still with a little gas pain in neck pain.   Objective: Vital signs in last 24 hours: Temp:  [98.6 F (37 C)-100.1 F (37.8 C)] 100.1 F (37.8 C) (02/19 0441) Pulse Rate:  [73-92] 92 (02/19 0441) Resp:  [18-19] 18 (02/19 0441) BP: (124-137)/(56-73) 124/63 mmHg (02/19 0441) SpO2:  [96 %-99 %] 96 % (02/19 0441) Last BM Date: 09/02/15  Intake/Output from previous day: 02/18 0701 - 02/19 0700 In: 2905 [P.O.:1440; I.V.:1465] Out: 2800 [Urine:2800] Intake/Output this shift:    Looks much more comfortable today cta b/l Reg Soft, approp TTP; incision c/d/i,  No edema  Lab Results:   Recent Labs  09/04/15 0550 09/05/15 0607  WBC  --  10.7*  HGB 12.2 12.4  HCT 37.5 38.1  PLT  --  190   BMET  Recent Labs  09/04/15 1612 09/05/15 0607  NA 139 136  K 4.3 4.4  CL 104 102  CO2 24 25  GLUCOSE 108* 105*  BUN 10 11  CREATININE 1.08* 0.93  CALCIUM 9.4 9.2   PT/INR No results for input(s): LABPROT, INR in the last 72 hours. ABG No results for input(s): PHART, HCO3 in the last 72 hours.  Invalid input(s): PCO2, PO2  Studies/Results: Dg Cholangiogram Operative  09/03/2015  CLINICAL DATA:  ruq pain. Pt in LPO position for a radical nephrectomy. Hx of rt renal mass, gallstones EXAM: INTRAOPERATIVE CHOLANGIOGRAM TECHNIQUE: Cholangiographic images from the C-arm fluoroscopic device were submitted for interpretation post-operatively. Please see the procedural report for the amount of contrast and the fluoroscopy time utilized. COMPARISON:  CT 08/22/2015 FINDINGS: No persistent filling defects in the common duct. Intrahepatic ducts are incompletely visualized, appearing decompressed centrally. Contrast passes into the duodenum. : Negative for retained common duct stone. Electronically Signed   By: Lucrezia Europe M.D.   On: 09/03/2015 09:12    Anti-infectives: Anti-infectives    Start      Dose/Rate Route Frequency Ordered Stop   09/03/15 1600  ceFAZolin (ANCEF) IVPB 1 g/50 mL premix     1 g 100 mL/hr over 30 Minutes Intravenous Every 8 hours 09/03/15 1344 09/04/15 0148   09/03/15 0604  ceFAZolin (ANCEF) IVPB 2 g/50 mL premix  Status:  Discontinued     2 g 100 mL/hr over 30 Minutes Intravenous On call to O.R. 09/03/15 0604 09/03/15 0606   09/03/15 0603  ceFAZolin (ANCEF) IVPB 2 g/50 mL premix     2 g 100 mL/hr over 30 Minutes Intravenous 30 min pre-op 09/03/15 0603 09/03/15 0727      Assessment/Plan: s/p Procedure(s): LAPAROSCOPIC CHOLECYSTECTOMY WITH INTRAOPERATIVE CHOLANGIOGRAM (N/A) LAPAROSCOPIC RADICAL NEPHRECTOMY (N/A)  Doing much better today Cr normal Vitals ok Ok for dc from our perspective. Discussed dc instructions with pt F/u on chart  Dana Vroom M. Redmond Pulling, MD, FACS General, Bariatric, & Minimally Invasive Surgery [facility], [place]   LOS: 2 days    [name] 09/05/2015

## 2015-09-05 NOTE — Progress Notes (Signed)
Urology Progress Note  2 Days Post-Op   Subjective: AF VSS. + flatus.      No acute urologic events overnight. Ambulation:   positive Flatus:    positive Bowel movement  negative  Pain: some relief  Objective:  Blood pressure 124/63, pulse 92, temperature 100.1 F (37.8 C), temperature source Oral, resp. rate 18, height 5\' 7"  (1.702 m), weight 76.65 kg (168 lb 15.7 oz), last menstrual period 10/27/2013, SpO2 96 %.  Physical Exam:  General:  No acute distress, awake Extremities: extremities normal, atraumatic, no cyanosis or edema Genitourinary:  neg Foley:out    I/O last 3 completed shifts: In: 2905 [P.O.:1440; I.V.:1465] Out: 6300 [Urine:6300]  Recent Labs     09/04/15  0550  09/05/15  0607  HGB  12.2  12.4  WBC   --   10.7*  PLT   --   190    Recent Labs     09/04/15  1612  09/05/15  0607  NA  139  136  K  4.3  4.4  CL  104  102  CO2  24  25  BUN  10  11  CREATININE  1.08*  0.93  CALCIUM  9.4  9.2  GFRNONAA  58*  >60  GFRAA  >60  >60     No results for input(s): INR, APTT in the last 72 hours.  Invalid input(s): PT   Invalid input(s): ABG  Assessment/Plan:  Wound care discussed. Discussed discharge activity Discussed discharge with GS.

## 2015-09-11 IMAGING — US US ABDOMEN LIMITED
1 series · 13 of 25 positions shown · non-contrast
Comparison: None.

CLINICAL DATA: 54-year-old female with acute right upper quadrant
pain since [84] hours today. Initial encounter.

EXAM:
US ABDOMEN LIMITED - RIGHT UPPER QUADRANT

[Series 1: us abdomen limited · 0.20mm/px · 13 of 47 slices shown]
[im 1/47]
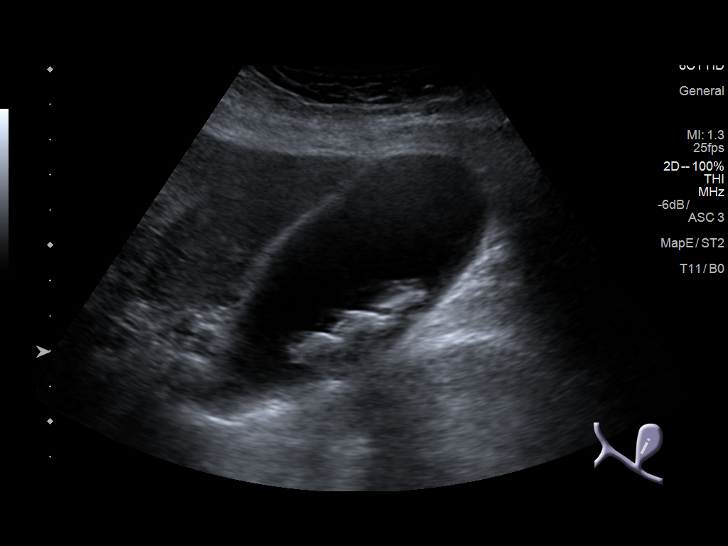
[im 4/47]
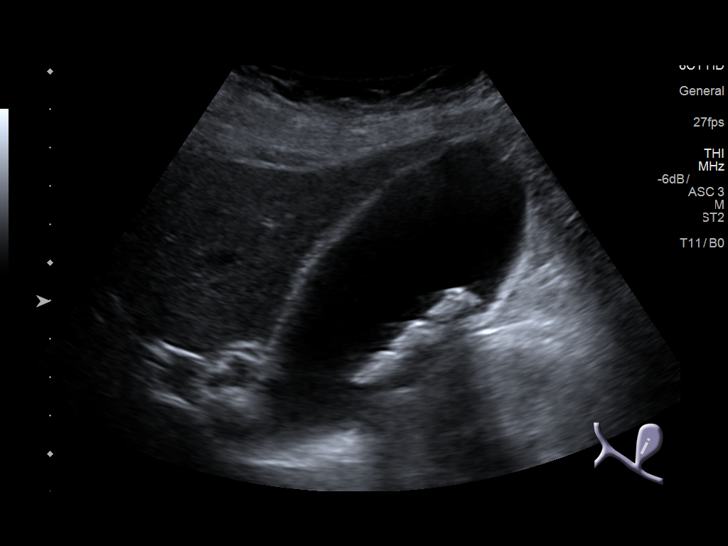
[im 8/47]
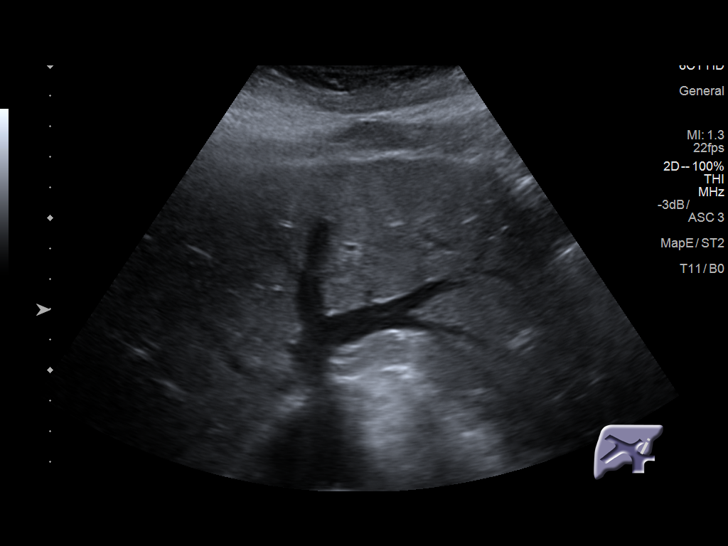
[im 12/47]
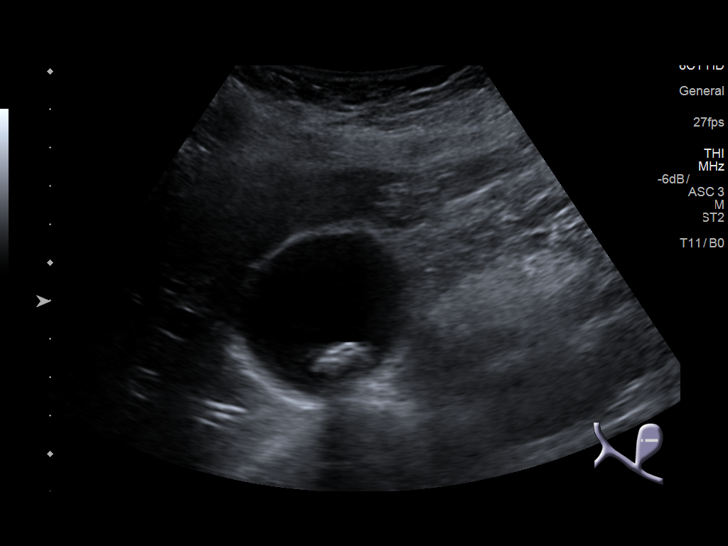
[im 16/47]
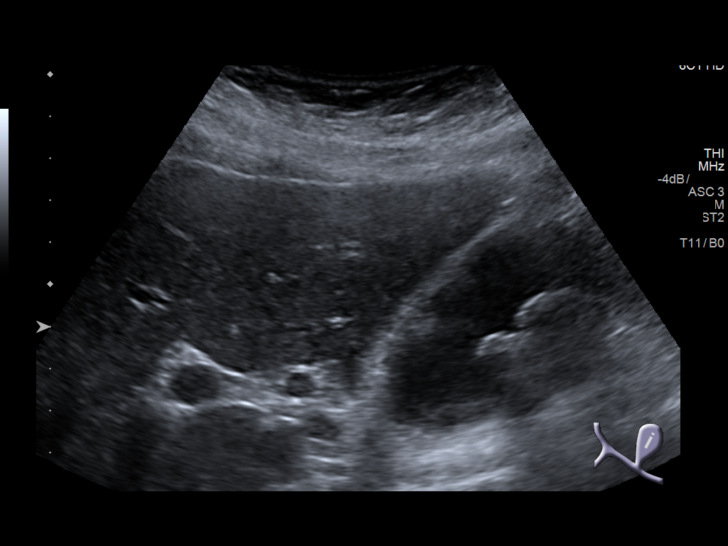
[im 20/47]
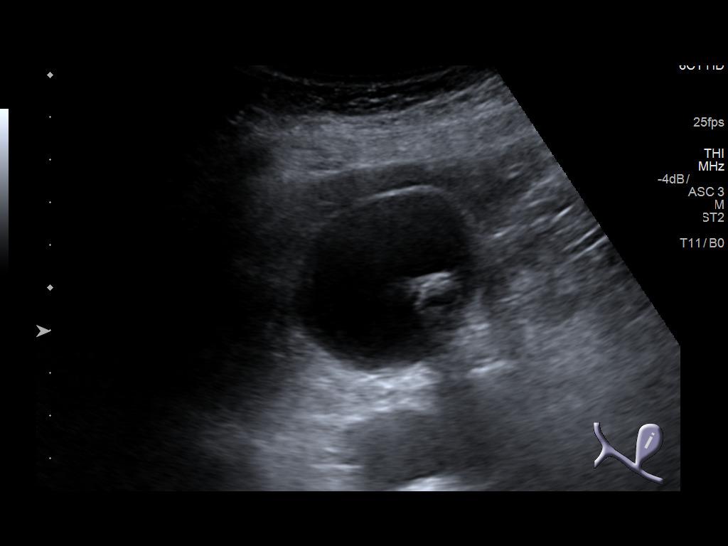
[im 24/47]
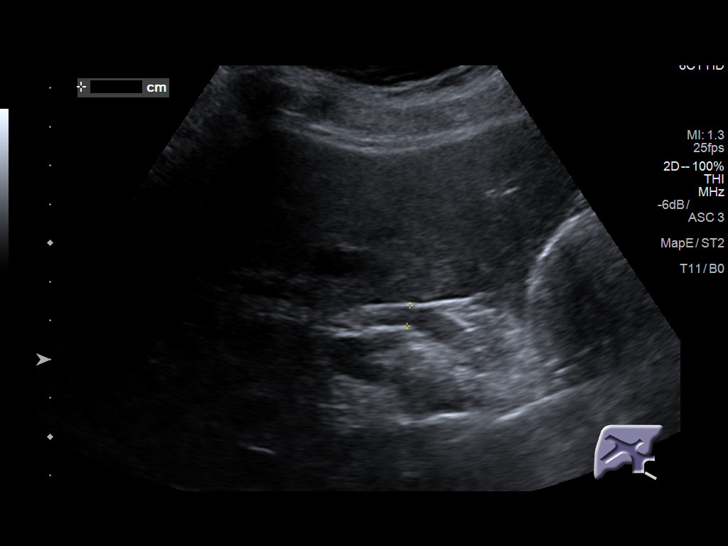
[im 27/47]
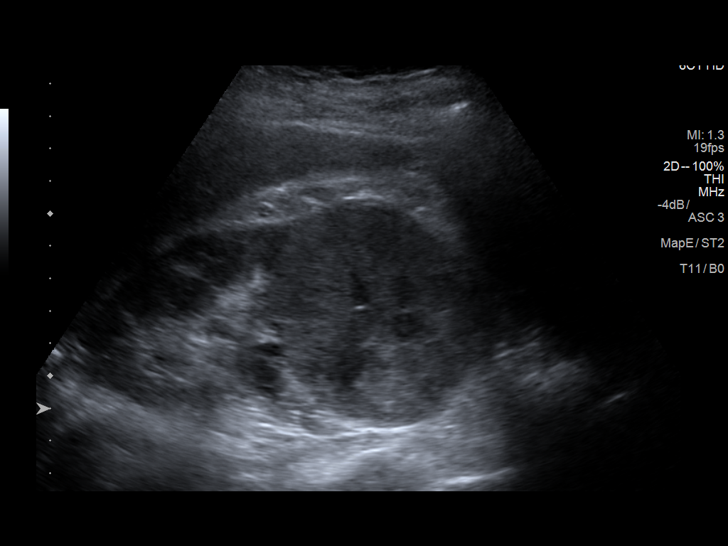
[im 31/47]
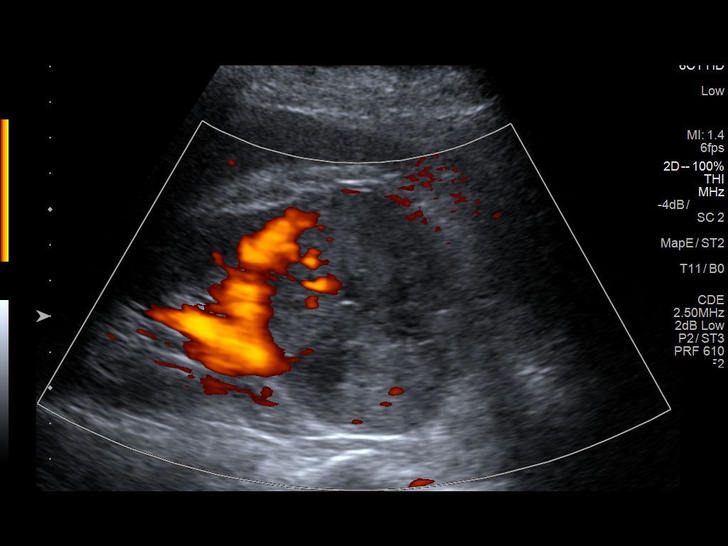
[im 35/47]
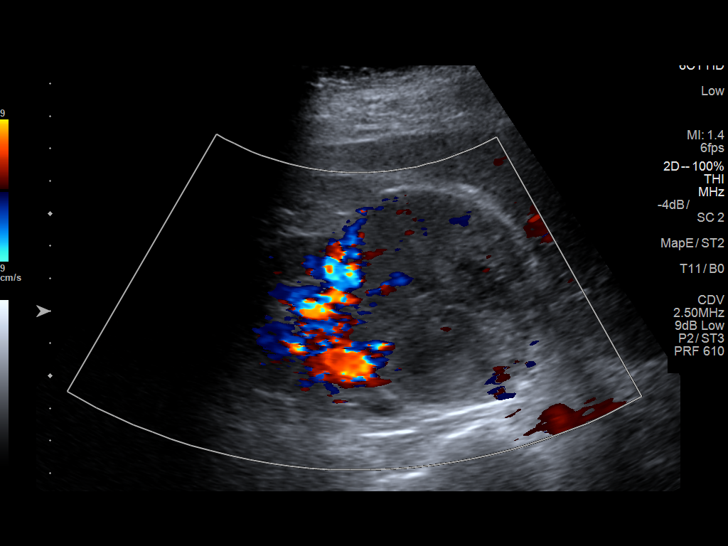
[im 39/47]
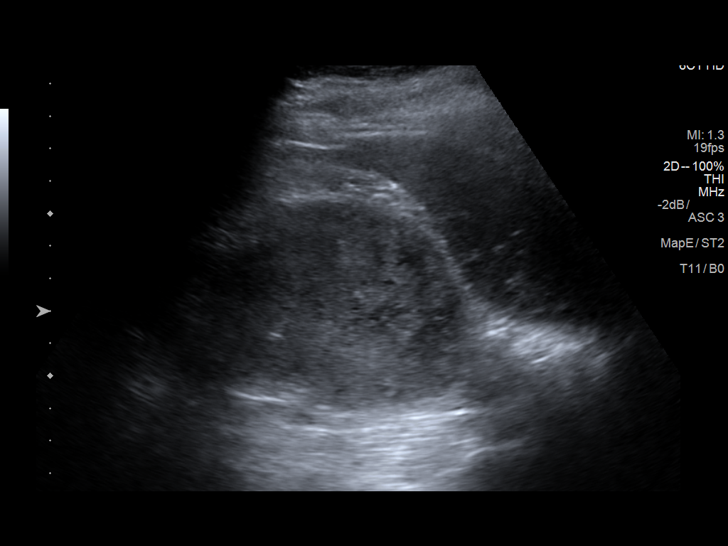
[im 43/47]
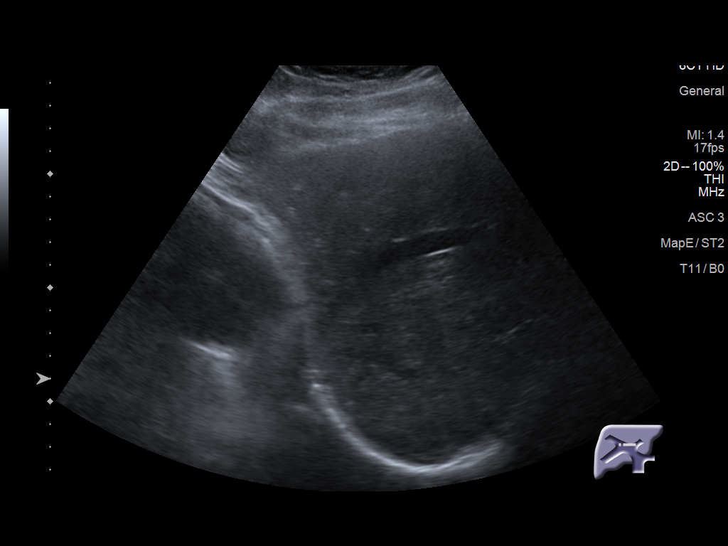
[im 47/47]
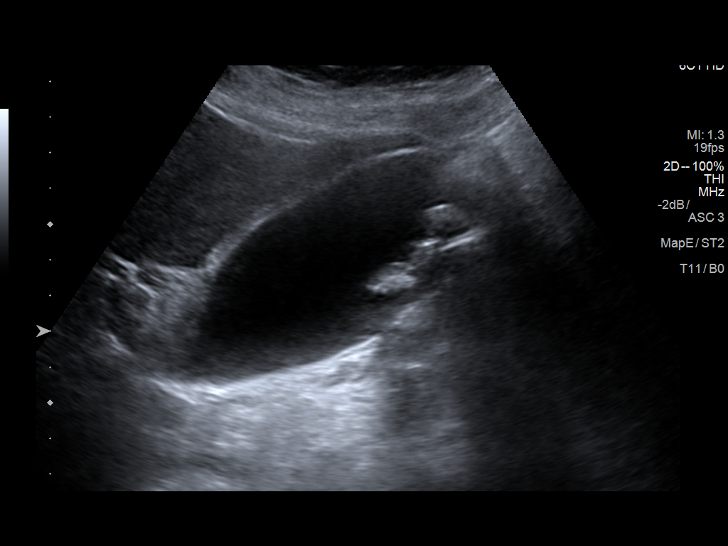

[13 of 25 positions shown; findings below may reference images not displayed]

FINDINGS: Gallbladder:

Multiple echogenic stones with shadowing, individually up to 19 mm
diameter. These appear mobile. However, there is mild gallbladder
wall thickening up to 5 mm. No pericholecystic fluid identified. No
sonographic Murphy sign elicited.

Common bile duct:

Diameter: 6 mm, within normal limits

Liver:

Overall liver echogenicity is within normal limits. No discrete
liver lesion, but suggestion of central intrahepatic biliary ductal
dilatation in the right lobe (image 13). The left lobe appears
spared.

Other findings: There is a solid, vascular 7.2 cm mass in the lower
pole of the right kidney (image 32 and 33). In all this measures
x 7.2 x 6.8 cm. The right upper pole appears normal. The hilum is
partially obscured by this mass.

No abdominal free fluid identified.
IMPRESSION: 1. Incidentally discovered 7.2 cm vascular mass in the lower pole of
the right kidney most compatible with Renal Cell Carcinoma.
Recommend Urology consultation.
2. Cholelithiasis with mild gallbladder wall thickening with right
intrahepatic biliary ductal enlargement.
Despite absent sonographic Murphy sign, the appearance is highly
suspicious for acute cholecystitis and/or acute biliary obstruction.

## 2015-10-13 NOTE — Discharge Summary (Signed)
Date of admission: 09/03/2015  Date of discharge: 09/05/15  Admission diagnosis: Right renal mass  Discharge diagnosis: same  Secondary diagnoses: none  History and Physical: For full details, please see admission history and physical. Briefly, Dana Hanson is a 55 y.o. year old patient with right renal mass here for surgery.  Procedure(s): LAPAROSCOPIC CHOLECYSTECTOMY WITH INTRAOPERATIVE CHOLANGIOGRAM LAPAROSCOPIC RADICAL NEPHRECTOMY    Hospital Course: The patient underwent the above procedure. They tolerated surgery well and were transferred to the floor postoperatively. Their diet was slowly advanced and at the time of discharge they were tolerating a regular diet. At the time of discharge their pain was controlled with PO medications and they were ambulating without difficulty. Their foley was removed prior to discharge and they were voiding spontaneously. They were discharged home on POD#2.    Laboratory values: No results for input(s): HGB, HCT in the last 72 hours. No results for input(s): CREATININE in the last 72 hours.  Disposition: Home  Discharge instruction: The patient was instructed to be ambulatory but told to refrain from heavy lifting, strenuous activity, or driving.   Discharge medications:    Medication List    TAKE these medications        calcium carbonate 1250 (500 Ca) MG tablet  Commonly known as:  OS-CAL - dosed in mg of elemental calcium  Take 1 tablet by mouth daily with breakfast.     ferrous sulfate 325 (65 FE) MG tablet  Take 325 mg by mouth daily with breakfast.     HAIR/SKIN/NAILS Tabs  Take 3 tablets by mouth daily.     HYDROcodone-acetaminophen 5-325 MG tablet  Commonly known as:  NORCO/VICODIN  Take 1-2 tablets by mouth every 6 (six) hours as needed.     multivitamin with minerals Tabs tablet  Take 1 tablet by mouth daily.     VITAMIN B-12 PO  Take 1 tablet by mouth daily.        Followup:      Follow-up Information    Follow up with Ardis Hughs, MD On 09/17/2015.   Specialty:  Urology   Why:  1:30pm   Contact information:   [place] [address] [phone]       Follow up with Mickeal Skinner, MD. Schedule an appointment as soon as possible for a visit in 3 weeks.   Specialty:  General Surgery   Why:  For wound re-check   Contact information:   [facility] [place] [address] [phone]       Follow up with Ardis Hughs, MD.   Specialty:  Urology   Contact information:   [place]  [address] [phone]

## 2016-01-10 ENCOUNTER — Other Ambulatory Visit (HOSPITAL_COMMUNITY): Payer: Self-pay | Admitting: Internal Medicine

## 2016-01-10 DIAGNOSIS — Z1231 Encounter for screening mammogram for malignant neoplasm of breast: Secondary | ICD-10-CM

## 2016-01-24 ENCOUNTER — Ambulatory Visit (HOSPITAL_COMMUNITY): Payer: BLUE CROSS/BLUE SHIELD

## 2016-06-13 ENCOUNTER — Encounter: Payer: Self-pay | Admitting: Orthopedic Surgery

## 2016-06-13 ENCOUNTER — Ambulatory Visit (INDEPENDENT_AMBULATORY_CARE_PROVIDER_SITE_OTHER): Payer: BLUE CROSS/BLUE SHIELD

## 2016-06-13 ENCOUNTER — Ambulatory Visit (INDEPENDENT_AMBULATORY_CARE_PROVIDER_SITE_OTHER): Payer: BLUE CROSS/BLUE SHIELD | Admitting: Orthopedic Surgery

## 2016-06-13 VITALS — BP 110/68 | HR 68 | Wt 158.0 lb

## 2016-06-13 DIAGNOSIS — M25522 Pain in left elbow: Secondary | ICD-10-CM

## 2016-06-13 DIAGNOSIS — M7712 Lateral epicondylitis, left elbow: Secondary | ICD-10-CM

## 2016-06-13 NOTE — Patient Instructions (Addendum)
You have received an injection of steroids into the joint. 15% of patients will have increased pain within the 24 hours postinjection.   This is transient and will go away.   We recommend that you use ice packs on the injection site for 20 minutes every 2 hours and extra strength Tylenol 2 tablets every 8 as needed until the pain resolves.  If you continue to have pain after taking the Tylenol and using the ice please call the office for further instructions.  Tennis elbow brace Super 7 exercises Epicondyle injection Cryotherapy (ice 20 minutes 3 times a day)    Tennis Elbow Tennis elbow (lateral epicondylitis) is inflammation of the outer tendons of your forearm close to your elbow. Your tendons attach your muscles to your bones. The outer tendons of your forearm are used to extend your wrist, and they attach on the outside part of your elbow. Tennis elbow is often found in people who play tennis, but anyone may get the condition from repeatedly extending the wrist or turning the forearm. What are the causes? This condition is caused by repeatedly extending your wrist and using your hands. It can result from sports or work that requires repetitive forearm movements. Tennis elbow may also be caused by an injury. What increases the risk? You have a higher risk of developing tennis elbow if you play tennis or another racquet sport. You also have a higher risk if you frequently use your hands for work. This condition is also more likely to develop in:  Musicians.  Carpenters, painters, and plumbers.  Cooks.  Cashiers.  People who work in [employer].  Architect workers.  Butchers.  People who use computers. What are the signs or symptoms? Symptoms of this condition include:  Pain and tenderness in your forearm and the outer part of your elbow. You may only feel the pain when you use your arm, or you may feel it even when you are not using your arm.  A burning feeling that runs  from your elbow through your arm.  Weak grip in your hands. How is this diagnosed? This condition may be diagnosed by medical history and physical exam. You may also have other tests, including:  X-rays.  MRI. How is this treated? Your health care provider will recommend lifestyle adjustments, such as resting and icing your arm. Treatment may also include:  Medicines for inflammation. This may include shots of cortisone if your pain continues.  Physical therapy. This may include massage or exercises.  An elbow brace. Surgery may eventually be recommended if your pain does not go away with treatment. Follow these instructions at home: Activity  Rest your elbow and wrist as directed by your health care provider. Try to avoid any activities that caused the problem until your health care provider says that you can do them again.  If a physical therapist teaches you exercises, do all of them as directed.  If you lift an object, lift it with your palm facing upward. This lowers the stress on your elbow. Lifestyle  If your tennis elbow is caused by sports, check your equipment and make sure that:  You are using it correctly.  It is the best fit for you.  If your tennis elbow is caused by work, take breaks frequently, if you are able. Talk with your manager about how to best perform tasks in a way that is safe.  If your tennis elbow is caused by computer use, talk with your manager about any  changes that can be made to your work environment. General instructions  If directed, apply ice to the painful area:  Put ice in a plastic bag.  Place a towel between your skin and the bag.  Leave the ice on for 20 minutes, 2-3 times per day.  Take medicines only as directed by your health care provider.  If you were given a brace, wear it as directed by your health care provider.  Keep all follow-up visits as directed by your health care provider. This is important. Contact a health  care provider if:  Your pain does not get better with treatment.  Your pain gets worse.  You have numbness or weakness in your forearm, hand, or fingers. This information is not intended to replace advice given to you by your health care provider. Make sure you discuss any questions you have with your health care provider. Document Released: 07/03/2005 Document Revised: 03/02/2016 Document Reviewed: 06/29/2014 Elsevier Interactive Patient Education  2017 [employer].

## 2016-06-13 NOTE — Progress Notes (Signed)
Patient ID: Dana Hanson, female   DOB: 04/23/1961, 55 y.o.   MRN: [id number]  Chief Complaint  Patient presents with  . Elbow Pain    left elbow pain    HPI Dana Hanson is a 55 y.o. female.  Presents for evaluation of left elbow pain  She is left-hand dominant. She fell 3 weeks ago. Since that time she has noticed lateral elbow pain difficulty holding her coffee cup. The pain is sometimes can be severe it intermittently worse at times. It is nonradiating.  Review of Systems Review of Systems  Constitutional: Negative for chills, fever and weight loss.  Respiratory: Negative for shortness of breath.   Cardiovascular: Negative for chest pain.  Neurological: Negative for tingling.   Review of Systems  Constitutional: Negative for chills, fever and weight loss.  Respiratory: Negative for shortness of breath.   Cardiovascular: Negative for chest pain.  Neurological: Negative for tingling.     Past Medical History:  Diagnosis Date  . Gallstone pancreatitis   . Hyperlipidemia   . Right renal mass     Past Surgical History:  Procedure Laterality Date  . CESAREAN SECTION     x 2-tubal with last c-section  . CHOLECYSTECTOMY N/A 09/03/2015   Procedure: LAPAROSCOPIC CHOLECYSTECTOMY WITH INTRAOPERATIVE CHOLANGIOGRAM;  Surgeon: Mickeal Skinner, MD;  Location: [facility];  Service: General;  Laterality: N/A;  . COLONOSCOPY  08/14/2011   Procedure: COLONOSCOPY;  Surgeon: Daneil Dolin, MD;  Location: [facility];  Service: Endoscopy;  Laterality: N/A;  10:00  . HEMORRHOID SURGERY  11/10/2011   Procedure: HEMORRHOIDECTOMY;  Surgeon: Donato Heinz, MD;  Location: [facility];  Service: General;  Laterality: N/A;  . LAPAROSCOPIC GASTRIC SLEEVE RESECTION  01-2015  . LAPAROSCOPIC NEPHRECTOMY N/A 09/03/2015   Procedure: LAPAROSCOPIC RADICAL NEPHRECTOMY;  Surgeon: Ardis Hughs, MD;  Location: [facility];  Service: Urology;  Laterality: N/A;  . TUBAL LIGATION     with last c-section     Social History Social History  Substance Use Topics  . Smoking status: Never Smoker  . Smokeless tobacco: Never Used  . Alcohol use No    No Known Allergies  Current Meds  Medication Sig  . calcium carbonate (OS-CAL - DOSED IN MG OF ELEMENTAL CALCIUM) 1250 (500 Ca) MG tablet Take 1 tablet by mouth daily with breakfast.  . Cyanocobalamin (VITAMIN B-12 PO) Take 1 tablet by mouth daily.  . ferrous sulfate 325 (65 FE) MG tablet Take 325 mg by mouth daily with breakfast.  . Multiple Vitamin (MULITIVITAMIN WITH MINERALS) TABS Take 1 tablet by mouth daily.  . Multiple Vitamins-Minerals (HAIR/SKIN/NAILS) TABS Take 3 tablets by mouth daily.   . [DISCONTINUED] HYDROcodone-acetaminophen (NORCO/VICODIN) 5-325 MG tablet Take 1-2 tablets by mouth every 6 (six) hours as needed.      Physical Exam Physical Exam BP 110/68   Pulse 68   Wt 158 lb (71.7 kg)   LMP 10/27/2013   BMI 24.75 kg/m   Gen. appearance. The patient is well-developed and well-nourished, grooming and hygiene are normal. There are no gross congenital abnormalities  The patient is alert and oriented to person place and time  Mood and affect are normal  Ambulation Normal  Examination reveals the following: On inspection we find left elbow tenderness at the lateral epicondyle normal range of motion though she does have pain with flexion extension at terminal range of motion. Elbow is stable. Grip strength in wrist extension and elbow power is normal skin is  intact without rash sensation shows no abnormalities to palpation touch or pressure. Pulses are good in the radial artery  Right elbow normal range of motion no tenderness Data Reviewed X-rays of the left elbow  Assessment    Tennis elbow    Plan    Tennis elbow brace Super 7 exercises Epicondyle injection Cryotherapy  Procedure note injection for left tennis elbow  Diagnosis left tennis elbow  Anesthesia ethyl chloride was used Alcohol use is  clean the skin  After we obtained verbal consent and timeout a 25-gauge needle was used to inject 40 mg of Depo-Medrol and 3 cc of 1% lidocaine just distal to the insertion of the ECRB  There were no complications and a sterile bandage was applied.        Arther Abbott 06/13/2016, 2:55 PM

## 2017-02-12 ENCOUNTER — Ambulatory Visit (HOSPITAL_COMMUNITY)
Admission: RE | Admit: 2017-02-12 | Discharge: 2017-02-12 | Disposition: A | Discharge status: Home / Self Care | Payer: BLUE CROSS/BLUE SHIELD | Source: Ambulatory Visit | Attending: Urology | Admitting: Urology

## 2017-02-12 ENCOUNTER — Other Ambulatory Visit: Payer: Self-pay | Admitting: Urology

## 2017-02-12 DIAGNOSIS — C641 Malignant neoplasm of right kidney, except renal pelvis: Secondary | ICD-10-CM | POA: Diagnosis not present

## 2017-02-12 IMAGING — CR DG CHEST 2V
2 series · 2 of 2 positions shown · non-contrast
Comparison: [DATE].

CLINICAL DATA: History of right kidney cancer.

EXAM:
CHEST  2 VIEW

[w chest pa]
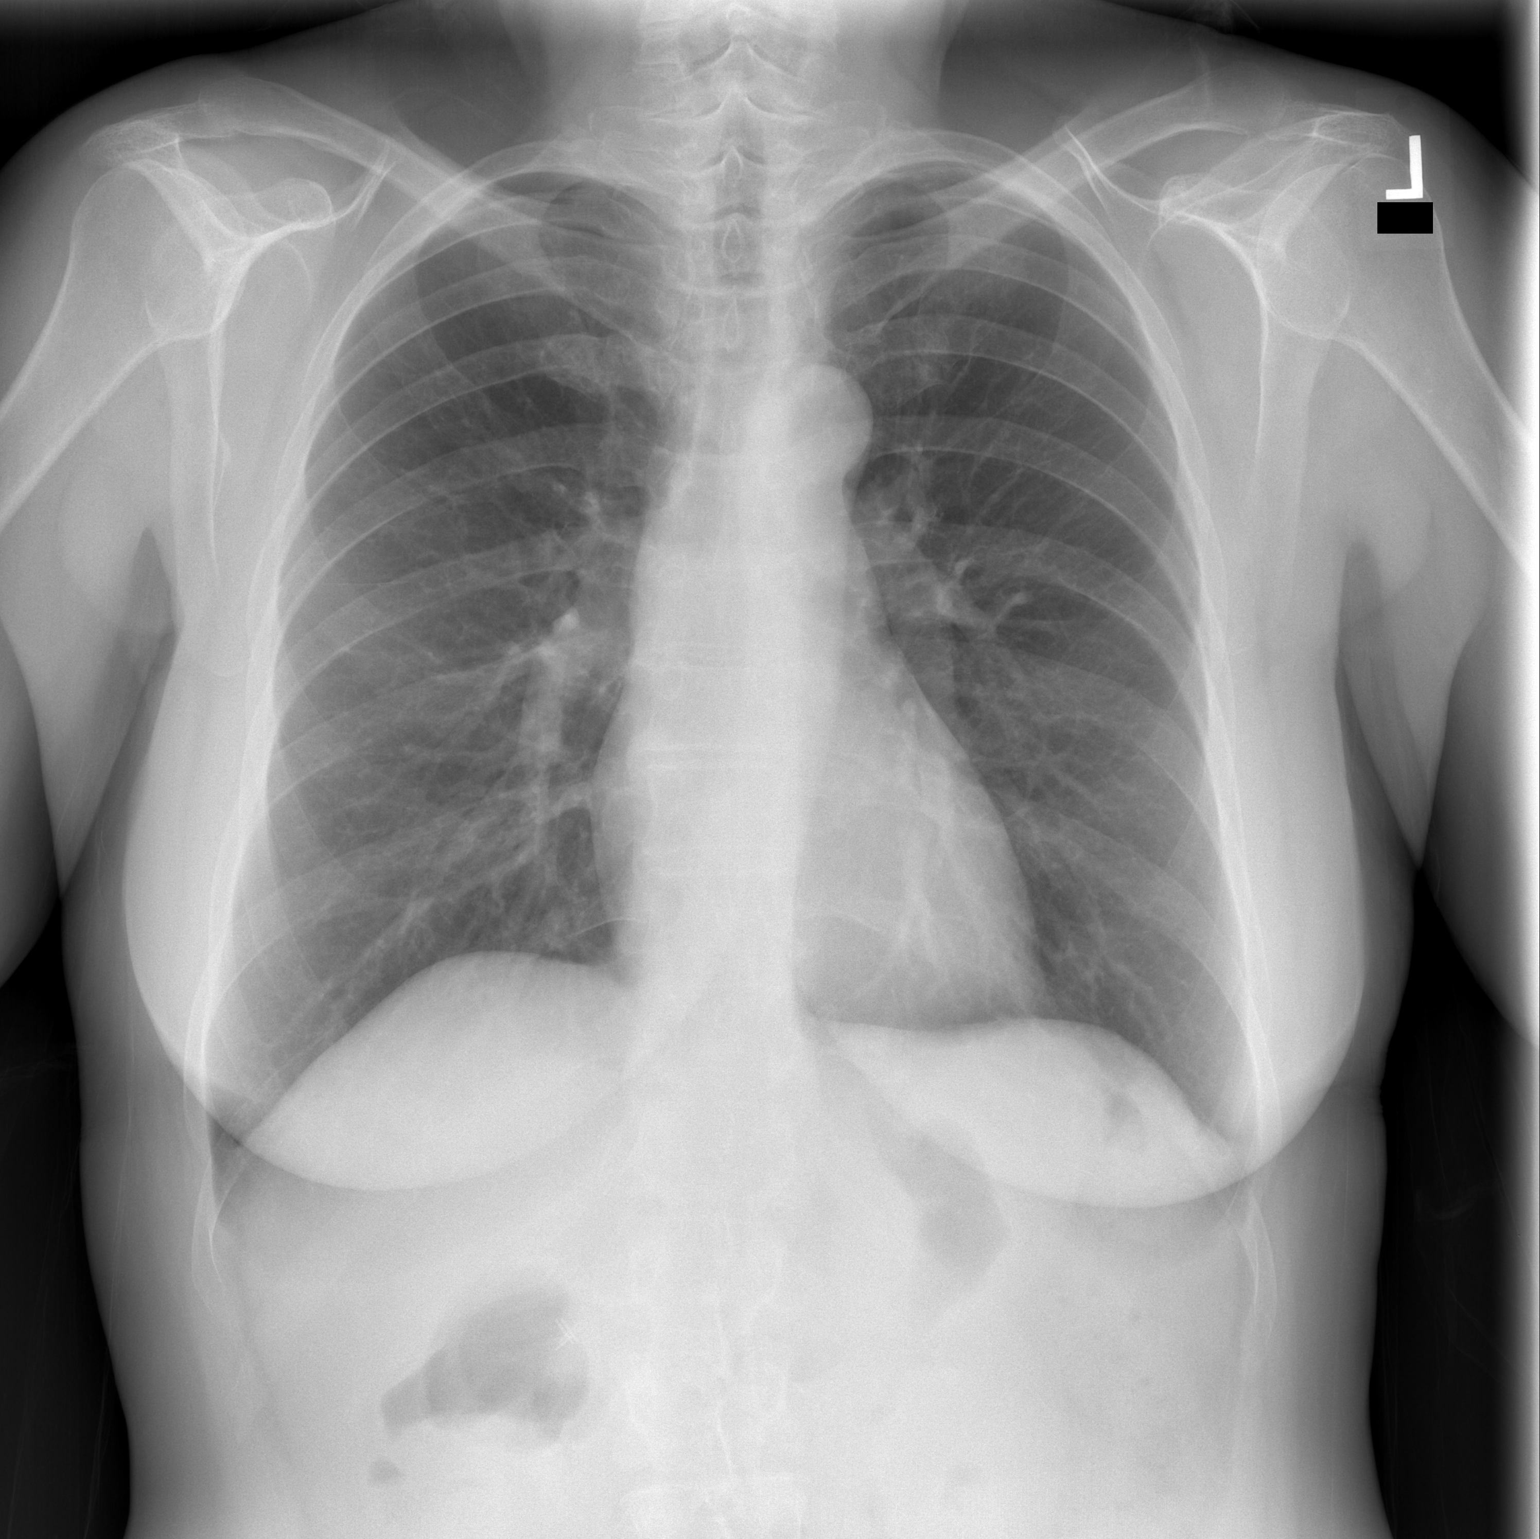

[w chest lat]
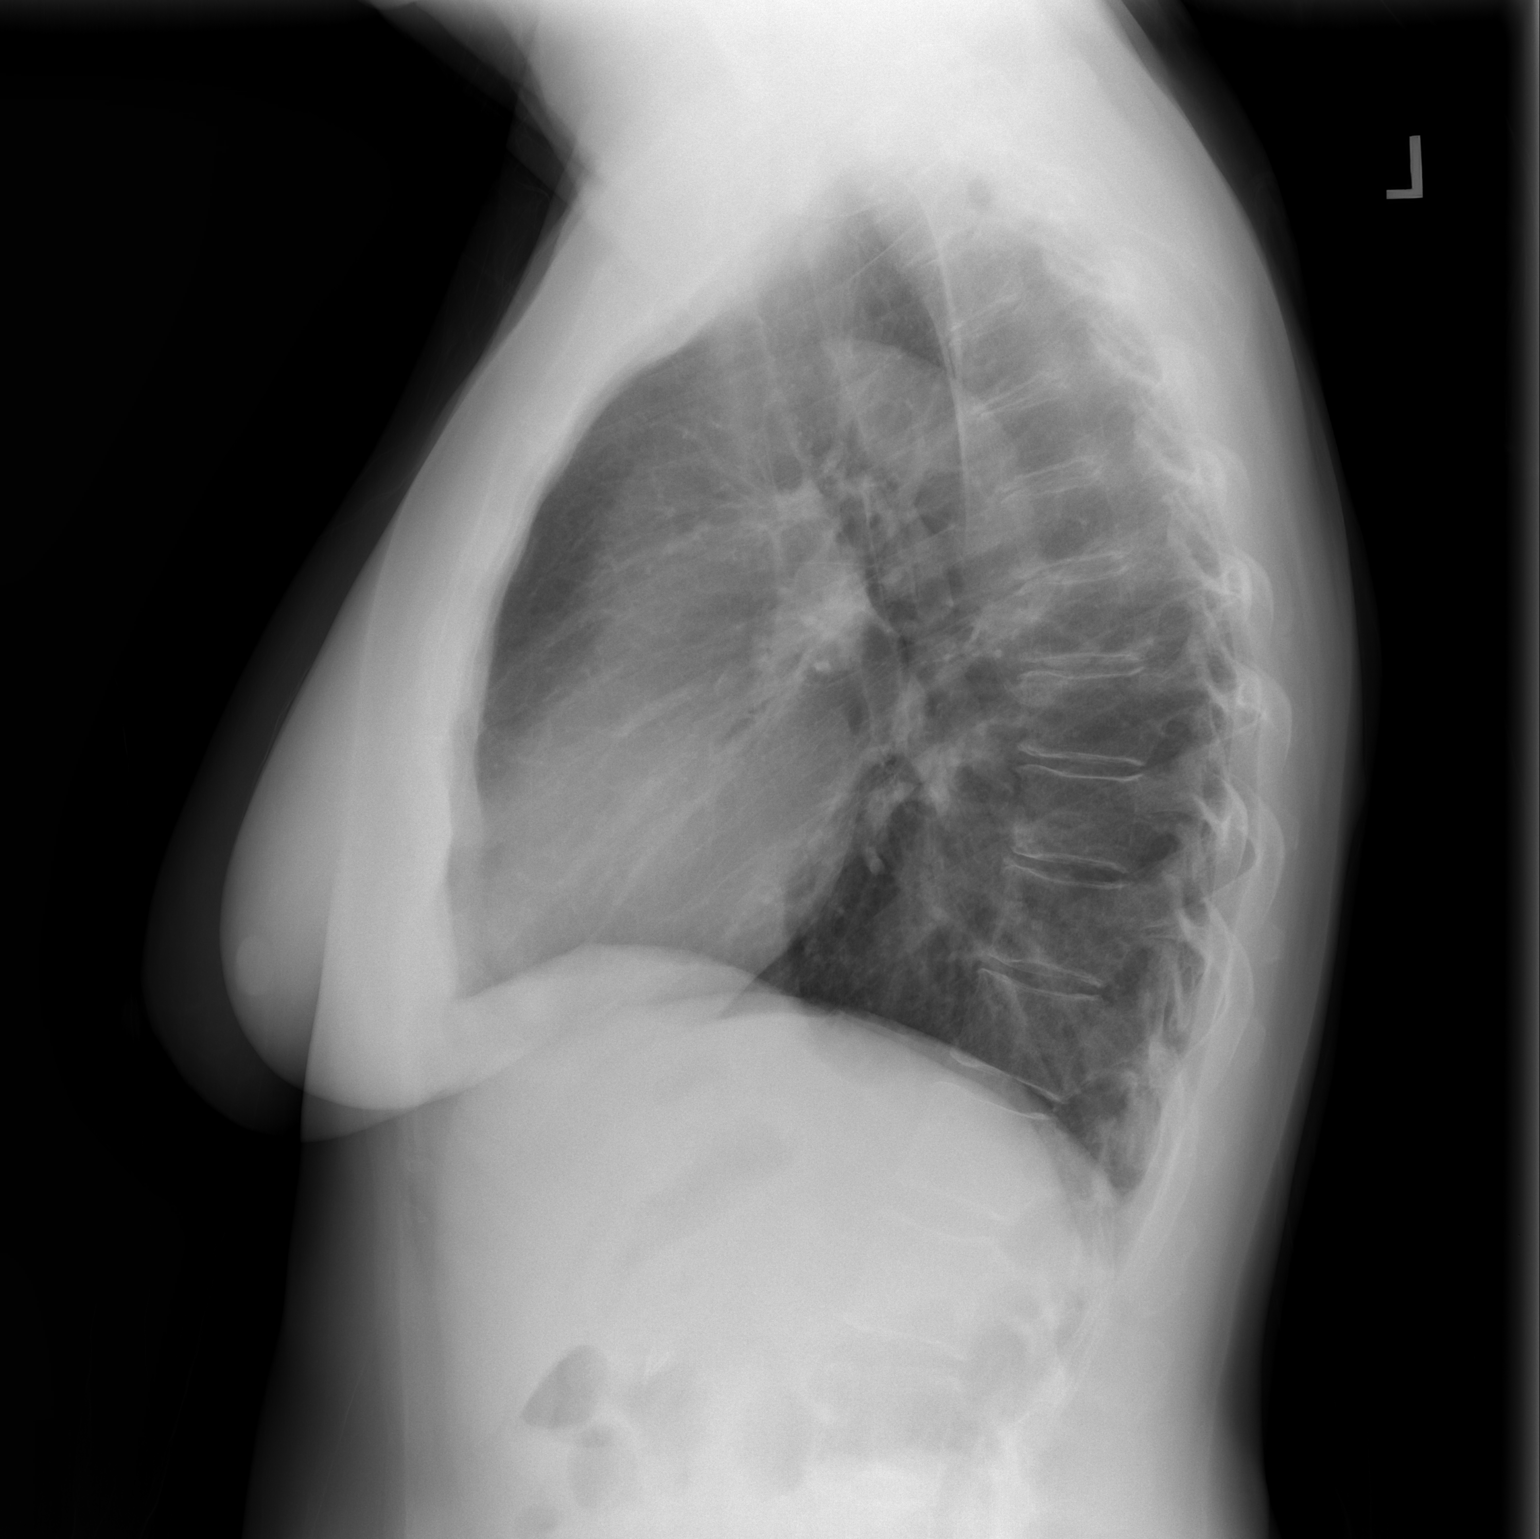

[2 of 2 positions shown; findings below may reference images not displayed]

FINDINGS: Normal sized heart. Clear lungs. Mild thoracic spine degenerative
changes. Cholecystectomy clips.
IMPRESSION: No acute abnormality.  No evidence of metastatic disease.

## 2018-03-25 ENCOUNTER — Other Ambulatory Visit: Payer: Self-pay | Admitting: Urology

## 2018-03-25 ENCOUNTER — Ambulatory Visit (HOSPITAL_COMMUNITY)
Admission: RE | Admit: 2018-03-25 | Discharge: 2018-03-25 | Disposition: A | Discharge status: Home / Self Care | Payer: BLUE CROSS/BLUE SHIELD | Source: Ambulatory Visit | Attending: Urology | Admitting: Urology

## 2018-03-25 DIAGNOSIS — C641 Malignant neoplasm of right kidney, except renal pelvis: Secondary | ICD-10-CM

## 2018-03-25 IMAGING — CR DG CHEST 2V
2 series · 2 of 2 positions shown · non-contrast
Comparison: Chest x-ray of [DATE]

CLINICAL DATA: History of renal cell carcinoma and right
nephrectomy, evaluate for metastases

EXAM:
CHEST - 2 VIEW

[w chest pa]
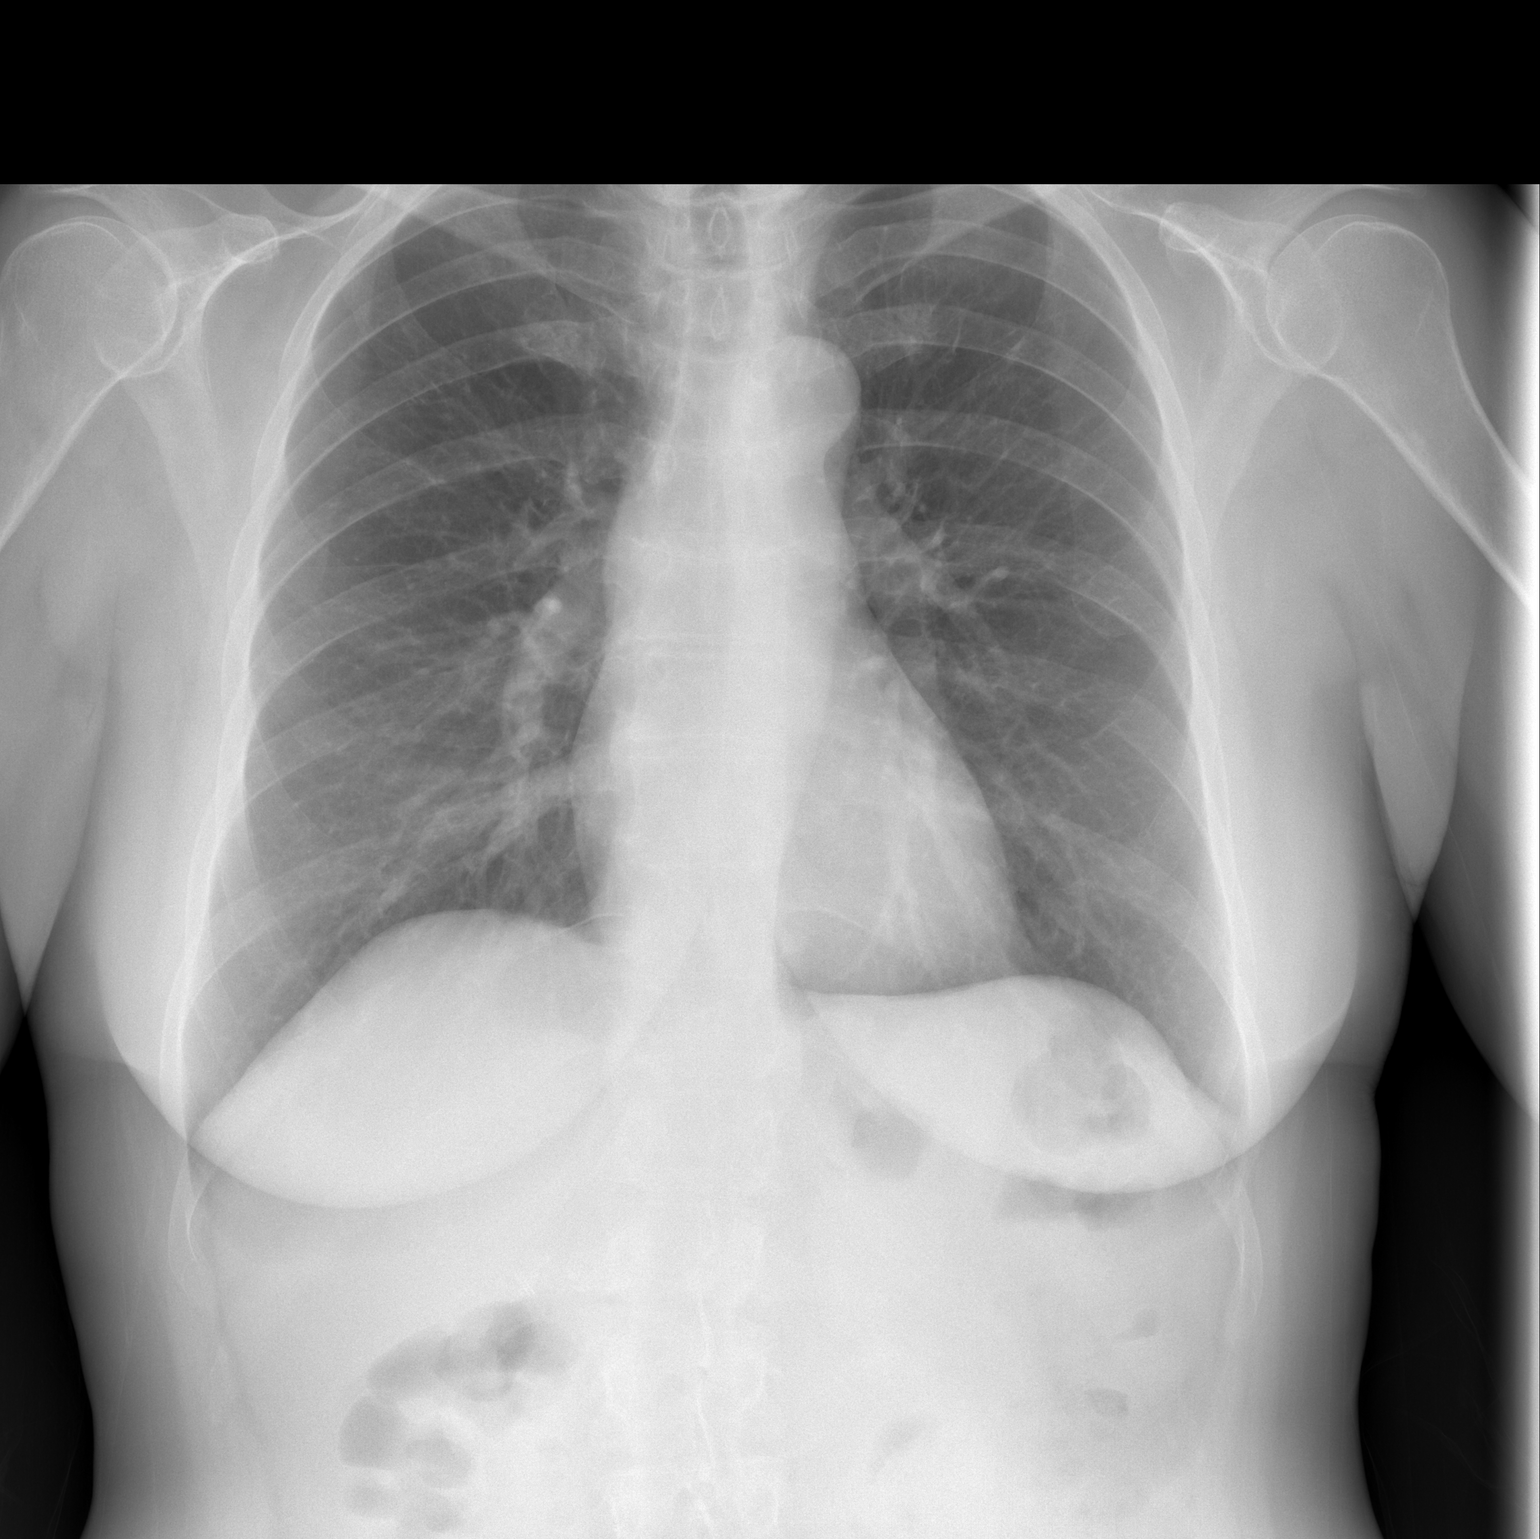

[w chest lat]
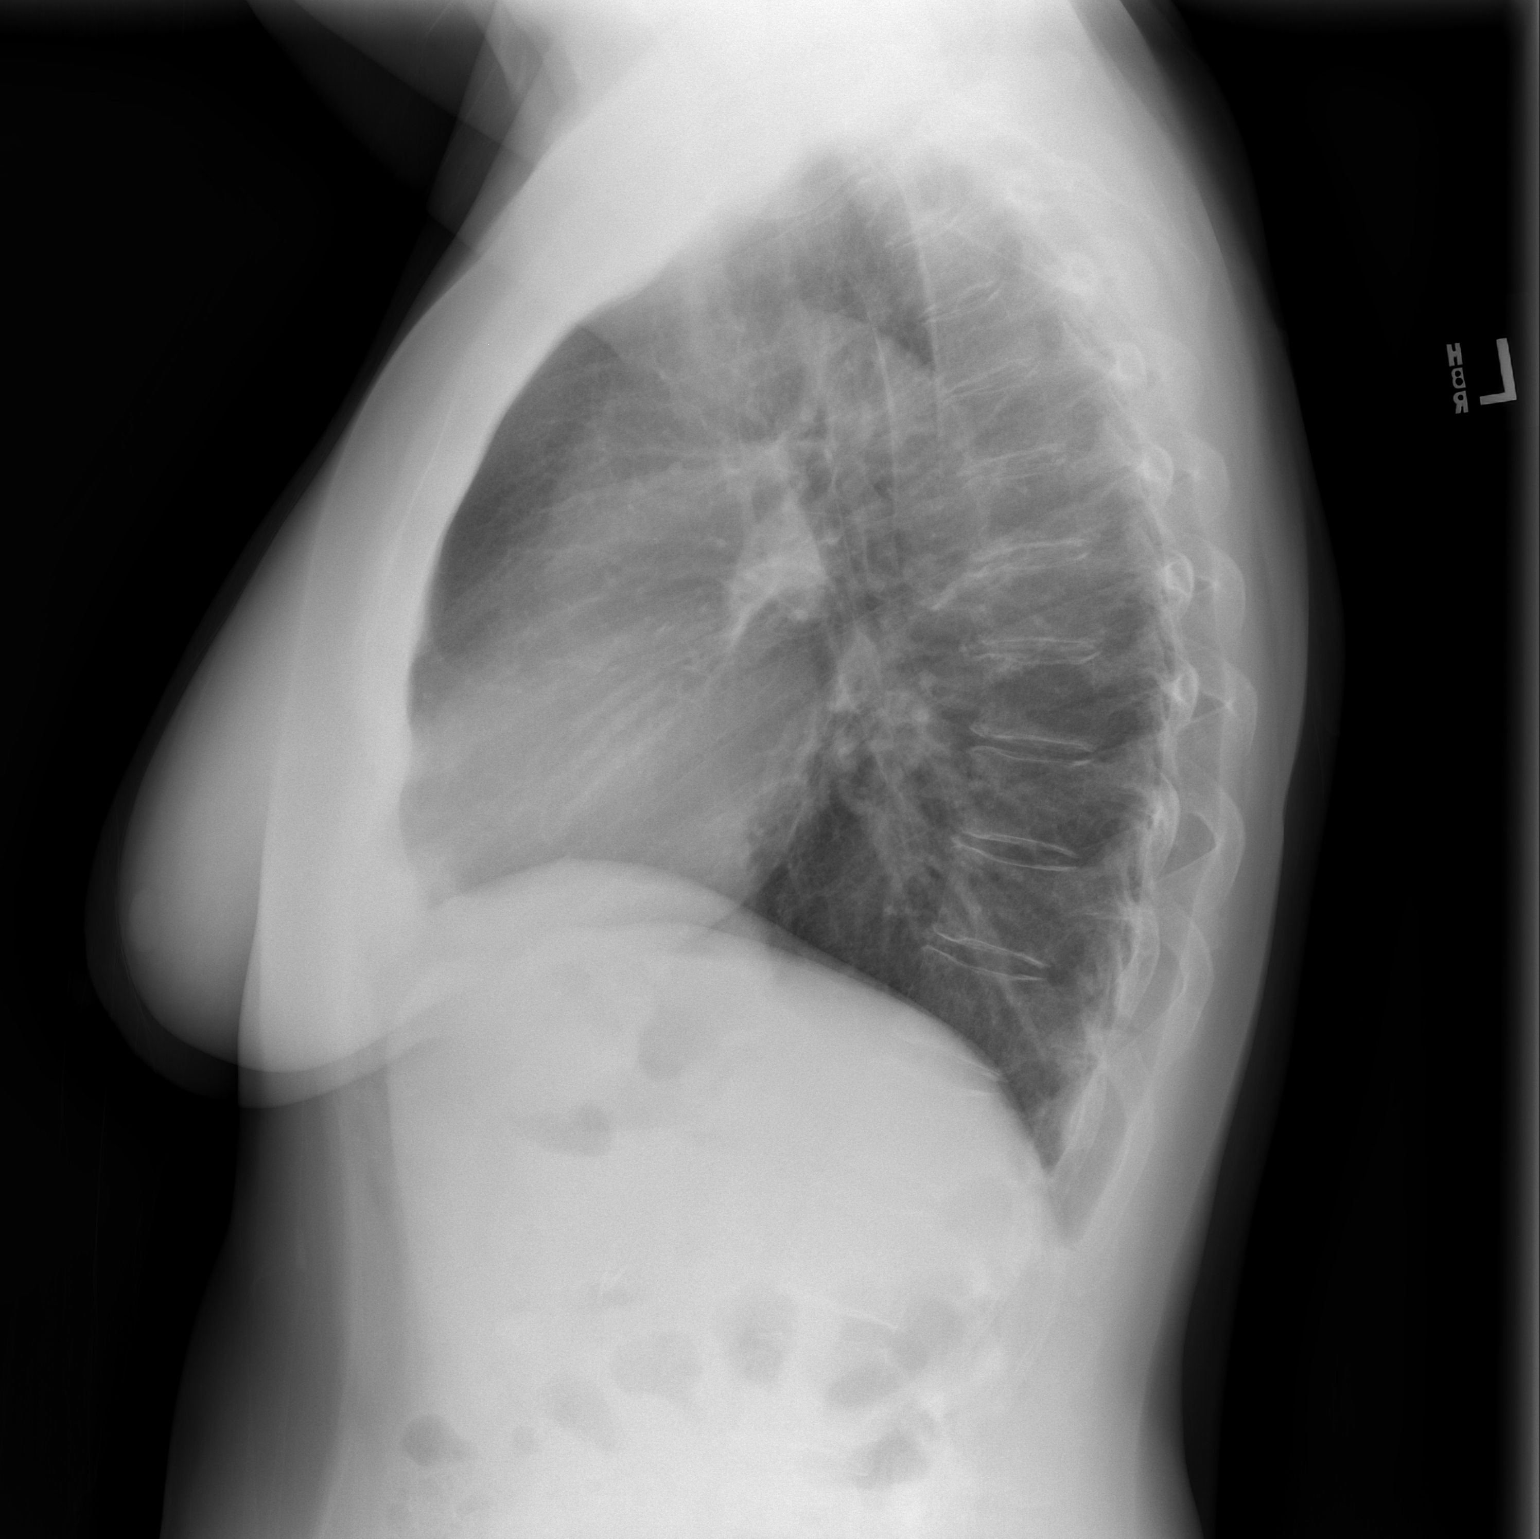

[2 of 2 positions shown; findings below may reference images not displayed]

FINDINGS: No active infiltrate or effusion is seen. There is no evidence of
lung metastases. Mediastinal and hilar contours are unremarkable.
The heart is within normal limits in size. No bony abnormality is
seen.
IMPRESSION: No active cardiopulmonary disease.

## 2019-08-26 ENCOUNTER — Other Ambulatory Visit: Payer: Self-pay

## 2019-08-26 ENCOUNTER — Ambulatory Visit (HOSPITAL_COMMUNITY)
Admission: RE | Admit: 2019-08-26 | Discharge: 2019-08-26 | Disposition: A | Discharge status: Home / Self Care | Payer: BC Managed Care – PPO | Source: Ambulatory Visit | Attending: Urology | Admitting: Urology

## 2019-08-26 ENCOUNTER — Other Ambulatory Visit (HOSPITAL_COMMUNITY): Payer: Self-pay | Admitting: Urology

## 2019-08-26 DIAGNOSIS — C642 Malignant neoplasm of left kidney, except renal pelvis: Secondary | ICD-10-CM

## 2019-08-26 IMAGING — CR DG CHEST 2V
2 series · 2 of 2 positions shown · non-contrast
Comparison: [DATE].

CLINICAL DATA: Renal cell carcinoma.

EXAM:
CHEST - 2 VIEW

[w chest pa]
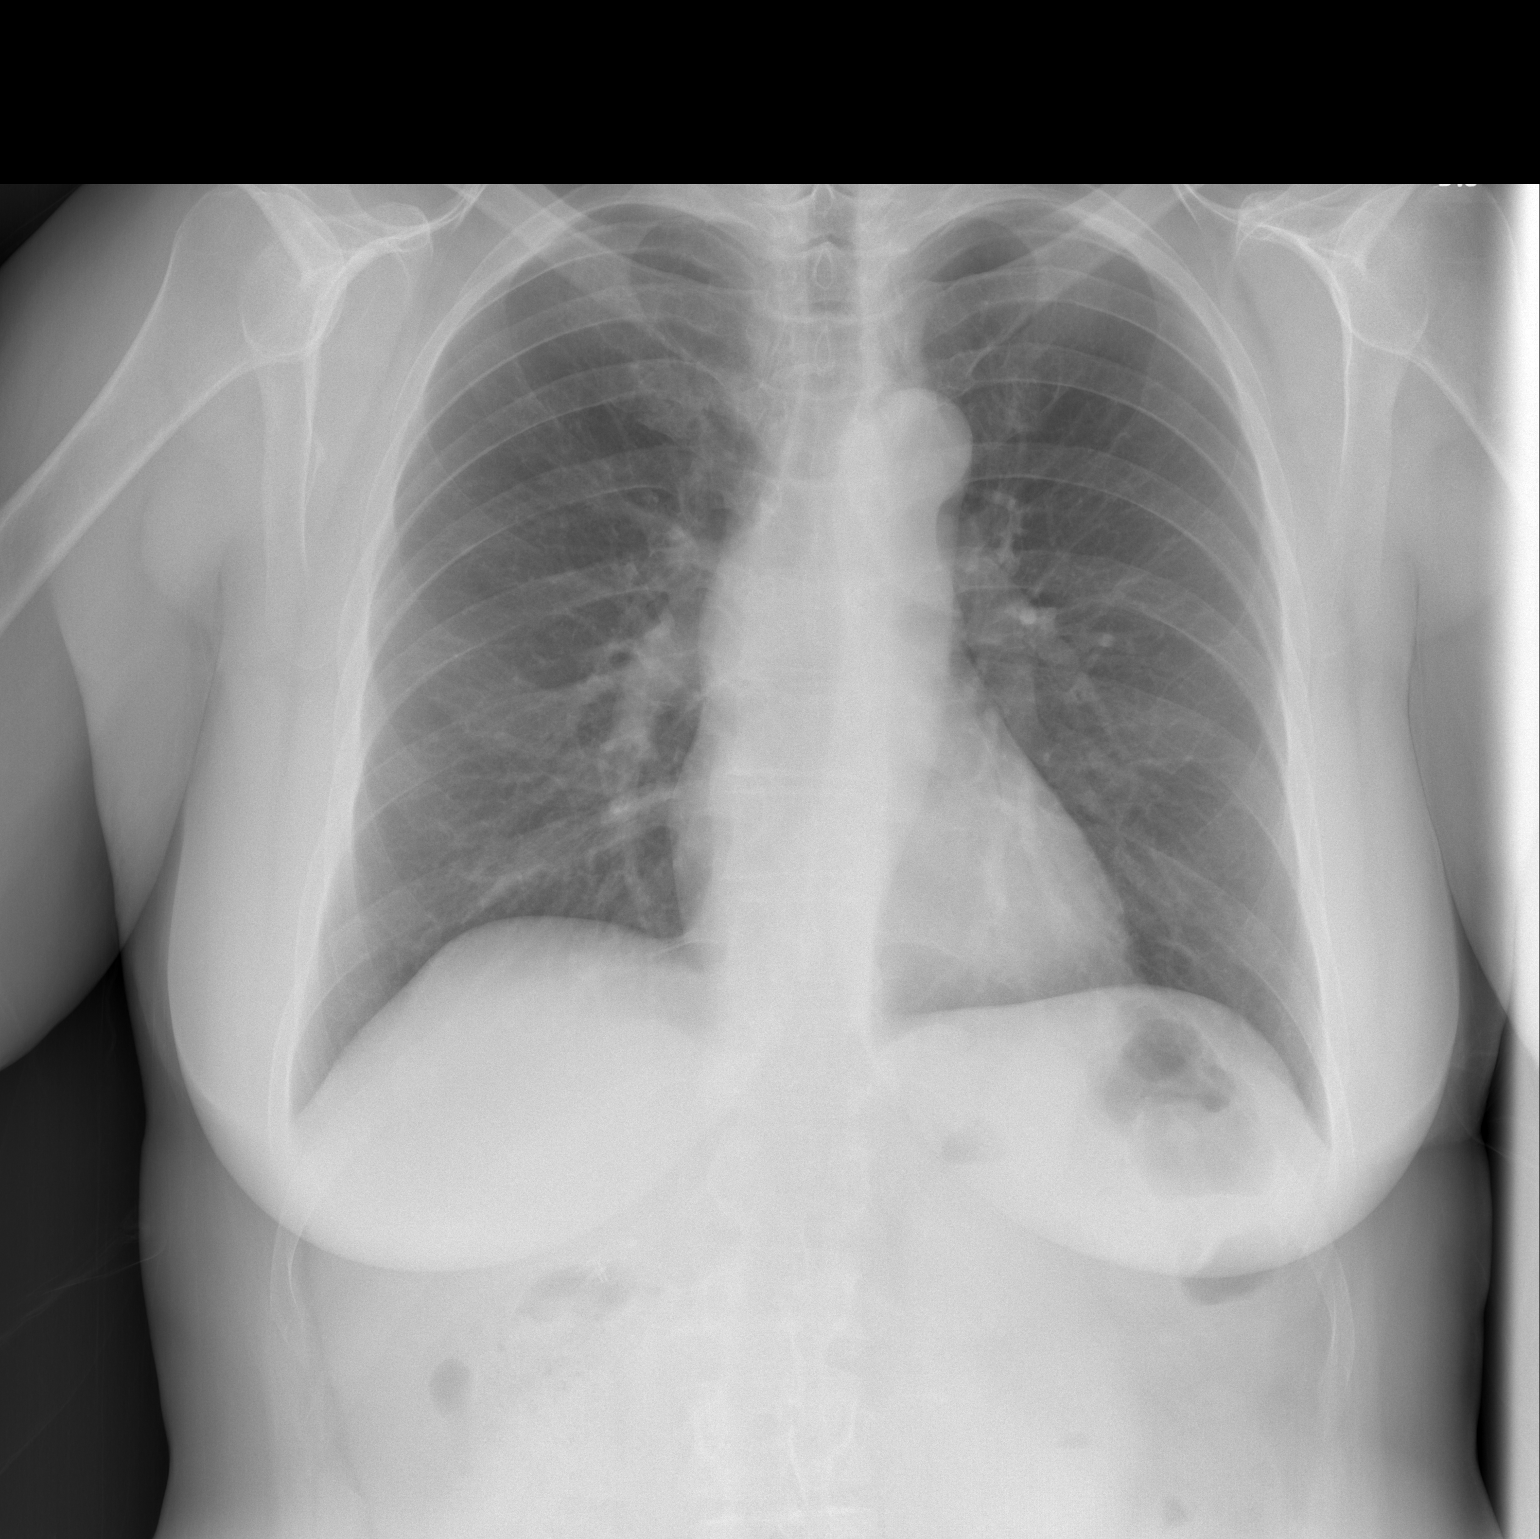

[w chest lat]
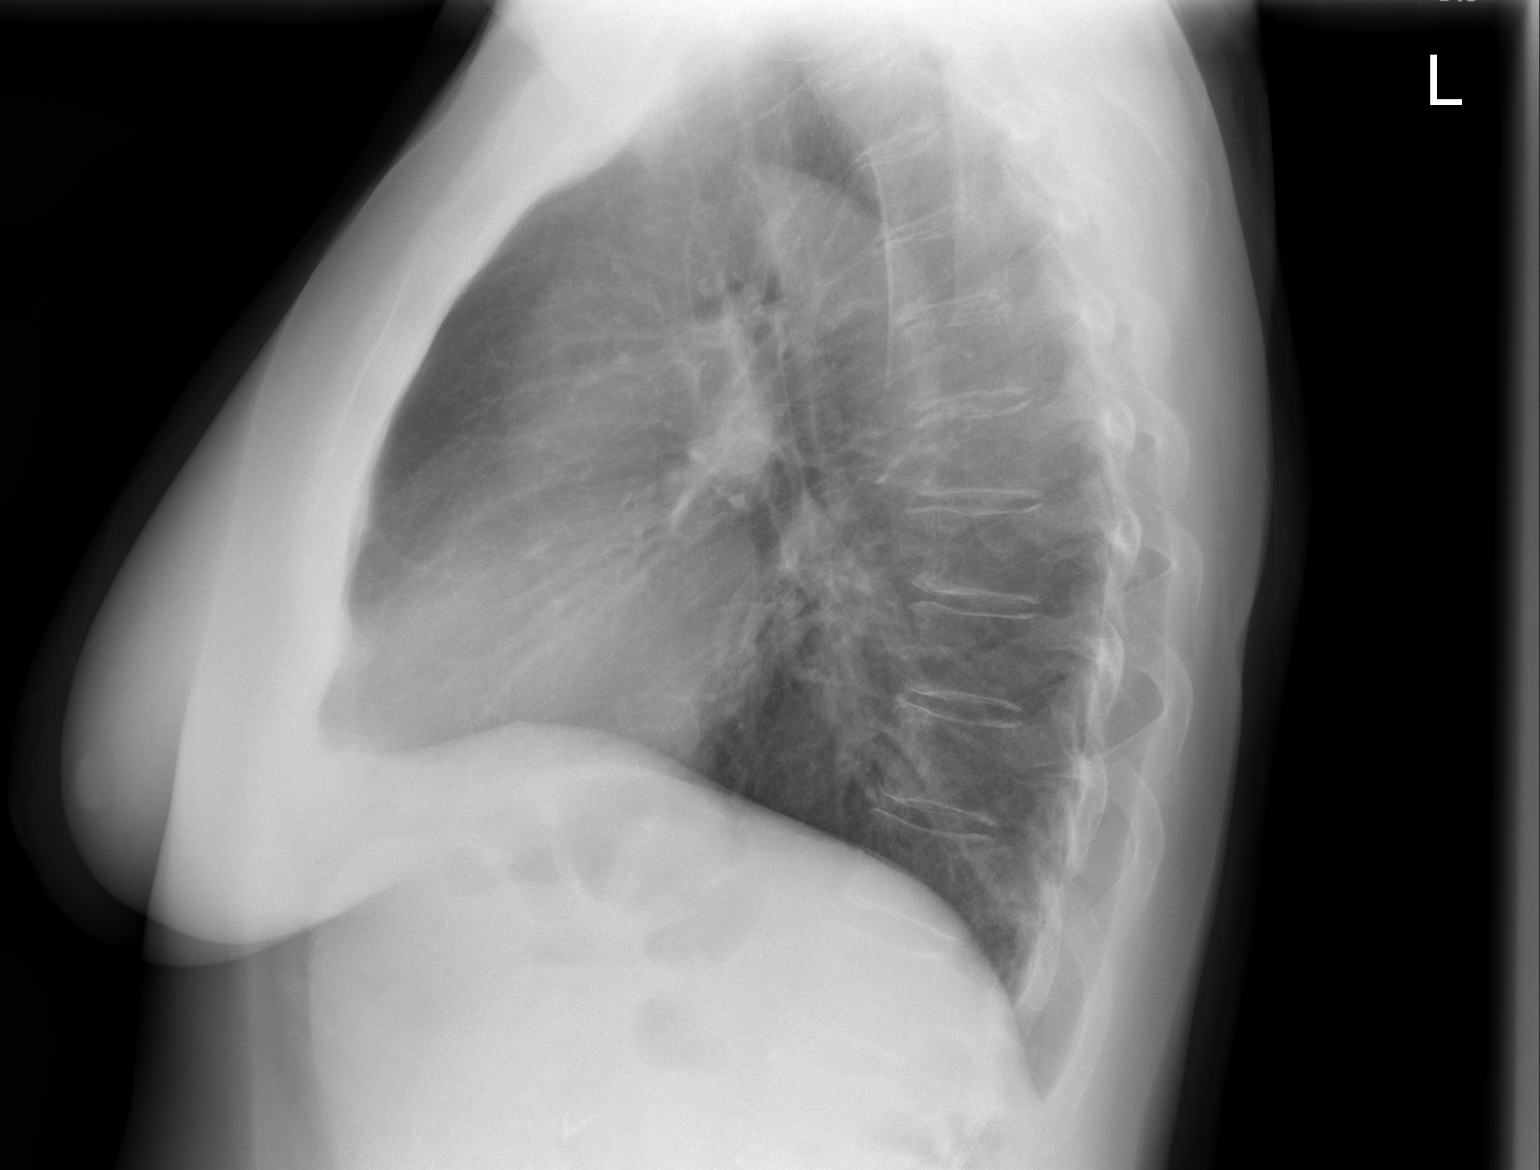

[2 of 2 positions shown; findings below may reference images not displayed]

FINDINGS: The heart size and mediastinal contours are within normal limits.
Both lungs are clear. The visualized skeletal structures are
unremarkable.
IMPRESSION: No active cardiopulmonary disease.

## 2020-02-24 ENCOUNTER — Ambulatory Visit: Payer: 59

## 2020-02-24 ENCOUNTER — Other Ambulatory Visit: Payer: Self-pay

## 2020-02-24 ENCOUNTER — Ambulatory Visit: Payer: 59 | Admitting: Orthopaedic Surgery

## 2020-02-24 ENCOUNTER — Other Ambulatory Visit: Payer: Self-pay | Admitting: Orthopaedic Surgery

## 2020-02-24 ENCOUNTER — Encounter: Payer: Self-pay | Admitting: Orthopaedic Surgery

## 2020-02-24 VITALS — BP 121/74 | HR 72 | Ht 67.0 in | Wt 184.1 lb

## 2020-02-24 DIAGNOSIS — M25561 Pain in right knee: Secondary | ICD-10-CM

## 2020-02-24 DIAGNOSIS — G8929 Other chronic pain: Secondary | ICD-10-CM | POA: Diagnosis not present

## 2020-02-24 MED ORDER — NAPROXEN 500 MG PO TABS: 500.0000 mg | ORAL_TABLET | Freq: Two times a day (BID) | ORAL | 5 refills | Status: DC

## 2020-02-24 NOTE — Progress Notes (Signed)
Subjective:    Patient ID: Dana Hanson, female    DOB: [birth year], 58 y.o.   MRN: [id number]  HPI She hurt her right knee walking her dog on Easter Sunday morning.  She had medial pain and swelling.  She said the pain lasted a few days.  On and off since then she has had slight medial pain but it did not last long.  She walks daily. Over the last two weeks she has begun to have more medial pain of the right knee and it is not going away.  It feels it may give way but has not.  She has swelling and popping.  Nothing seems to help it.  She has not fallen on the knee.  She has no swelling distally.   Review of Systems  Constitutional: Positive for activity change.  Musculoskeletal: Positive for arthralgias, gait problem and joint swelling.  All other systems reviewed and are negative.  For Review of Systems, all other systems reviewed and are negative.  The following is a summary of the past history medically, past history surgically, known current medicines, social history and family history.  This information is gathered electronically by the computer from prior information and documentation.  I review this each visit and have found including this information at this point in the chart is beneficial and informative.   Past Medical History:  Diagnosis Date  . Gallstone pancreatitis   . Hyperlipidemia   . Right renal mass     Past Surgical History:  Procedure Laterality Date  . CESAREAN SECTION     x 2-tubal with last c-section  . CHOLECYSTECTOMY N/A 09/03/2015   Procedure: LAPAROSCOPIC CHOLECYSTECTOMY WITH INTRAOPERATIVE CHOLANGIOGRAM;  Surgeon: Mickeal Skinner, MD;  Location: [facility];  Service: General;  Laterality: N/A;  . COLONOSCOPY  08/14/2011   Procedure: COLONOSCOPY;  Surgeon: Daneil Dolin, MD;  Location: [facility];  Service: Endoscopy;  Laterality: N/A;  10:00  . HEMORRHOID SURGERY  11/10/2011   Procedure: HEMORRHOIDECTOMY;  Surgeon: Donato Heinz, MD;   Location: [facility];  Service: General;  Laterality: N/A;  . LAPAROSCOPIC GASTRIC SLEEVE RESECTION  01-2015  . LAPAROSCOPIC NEPHRECTOMY N/A 09/03/2015   Procedure: LAPAROSCOPIC RADICAL NEPHRECTOMY;  Surgeon: Ardis Hughs, MD;  Location: [facility];  Service: Urology;  Laterality: N/A;  . TUBAL LIGATION     with last c-section    Current Outpatient Medications on File Prior to Visit  Medication Sig Dispense Refill  . calcium carbonate (OS-CAL - DOSED IN MG OF ELEMENTAL CALCIUM) 1250 (500 Ca) MG tablet Take 1 tablet by mouth daily with breakfast.    . Cyanocobalamin (VITAMIN B-12 PO) Take 1 tablet by mouth daily.    . ferrous sulfate 325 (65 FE) MG tablet Take 325 mg by mouth daily with breakfast.    . Multiple Vitamin (MULITIVITAMIN WITH MINERALS) TABS Take 1 tablet by mouth daily.    . Multiple Vitamins-Minerals (HAIR/SKIN/NAILS) TABS Take 3 tablets by mouth daily.      No current facility-administered medications on file prior to visit.    Social History   Socioeconomic History  . Marital status: Divorced    Spouse name: Not on file  . Number of children: 2  . Years of education: Not on file  . Highest education level: Not on file  Occupational History    Employer: [id number]  Tobacco Use  . Smoking status: Never Smoker  . Smokeless tobacco: Never Used  Substance and Sexual Activity  .  Alcohol use: No  . Drug use: No  . Sexual activity: Yes    Birth control/protection: Surgical  Other Topics Concern  . Not on file  Social History Narrative  . Not on file   Social Determinants of Health   Financial Resource Strain:   . Difficulty of Paying Living Expenses:   Food Insecurity:   . Worried About Charity fundraiser in the Last Year:   . Arboriculturist in the Last Year:   Transportation Needs:   . Film/video editor (Medical):   Marland Kitchen Lack of Transportation (Non-Medical):   Physical Activity:   . Days of Exercise per Week:   . Minutes of Exercise per Session:   Stress:     . Feeling of Stress :   Social Connections:   . Frequency of Communication with Friends and Family:   . Frequency of Social Gatherings with Friends and Family:   . Attends Religious Services:   . Active Member of Clubs or Organizations:   . Attends Archivist Meetings:   Marland Kitchen Marital Status:   Intimate Partner Violence:   . Fear of Current or Ex-Partner:   . Emotionally Abused:   Marland Kitchen Physically Abused:   . Sexually Abused:     Family History  Problem Relation Age of Onset  . Colon cancer Neg Hx   . Liver disease Neg Hx   . Anesthesia problems Neg Hx   . Hypotension Neg Hx   . Malignant hyperthermia Neg Hx   . Pseudochol deficiency Neg Hx     BP 121/74   Pulse 72   Ht 5\' 7"  (1.702 m)   Wt 184 lb 2 oz (83.5 kg)   LMP 10/27/2013   BMI 28.84 kg/m   Body mass index is 28.84 kg/m.     Objective:   Physical Exam Vitals and nursing note reviewed.  Constitutional:      Appearance: She is well-developed.  HENT:     Head: Normocephalic and atraumatic.  Eyes:     Conjunctiva/sclera: Conjunctivae normal.     Pupils: Pupils are equal, round, and reactive to light.  Cardiovascular:     Rate and Rhythm: Normal rate and regular rhythm.  Pulmonary:     Effort: Pulmonary effort is normal.  Abdominal:     Palpations: Abdomen is soft.  Musculoskeletal:     Cervical back: Normal range of motion and neck supple.       Legs:  Skin:    General: Skin is warm and dry.  Neurological:     Mental Status: She is alert and oriented to person, place, and time.     Cranial Nerves: No cranial nerve deficit.     Motor: No abnormal muscle tone.     Coordination: Coordination normal.     Deep Tendon Reflexes: Reflexes are normal and symmetric. Reflexes normal.  Psychiatric:        Behavior: Behavior normal.        Thought Content: Thought content normal.        Judgment: Judgment normal.   X-rays were done of the right knee, reported separately.        Assessment & Plan:    Encounter Diagnosis  Name Primary?  . Chronic pain of right knee Yes   I am concerned about a meniscus tear medially of the right knee.  I will give exercises to do.  I will give Naprosyn 500 po bid pc  Return in one month.  Consider MRI if not improved.  Call if any problem.  Precautions discussed.   Electronically Signed Sanjuana Kava, MD 8/10/20219:00 AM

## 2020-02-24 NOTE — Patient Instructions (Signed)
Journal for Nurse Practitioners, 15(4), 263-267. Retrieved April 22, 2018 from http://clinicalkey.com/nursing">  Knee Exercises Ask your health care provider which exercises are safe for you. Do exercises exactly as told by your health care provider and adjust them as directed. It is normal to feel mild stretching, pulling, tightness, or discomfort as you do these exercises. Stop right away if you feel sudden pain or your pain gets worse. Do not begin these exercises until told by your health care provider. Stretching and range-of-motion exercises These exercises warm up your muscles and joints and improve the movement and flexibility of your knee. These exercises also help to relieve pain and swelling. Knee extension, prone 1. Lie on your abdomen (prone position) on a bed. 2. Place your left / right knee just beyond the edge of the surface so your knee is not on the bed. You can put a towel under your left / right thigh just above your kneecap for comfort. 3. Relax your leg muscles and allow gravity to straighten your knee (extension). You should feel a stretch behind your left / right knee. 4. Hold this position for __________ seconds. 5. Scoot up so your knee is supported between repetitions. Repeat __________ times. Complete this exercise __________ times a day. Knee flexion, active  1. Lie on your back with both legs straight. If this causes back discomfort, bend your left / right knee so your foot is flat on the floor. 2. Slowly slide your left / right heel back toward your buttocks. Stop when you feel a gentle stretch in the front of your knee or thigh (flexion). 3. Hold this position for __________ seconds. 4. Slowly slide your left / right heel back to the starting position. Repeat __________ times. Complete this exercise __________ times a day. Quadriceps stretch, prone  1. Lie on your abdomen on a firm surface, such as a bed or padded floor. 2. Bend your left / right knee and hold  your ankle. If you cannot reach your ankle or pant leg, loop a belt around your foot and grab the belt instead. 3. Gently pull your heel toward your buttocks. Your knee should not slide out to the side. You should feel a stretch in the front of your thigh and knee (quadriceps). 4. Hold this position for __________ seconds. Repeat __________ times. Complete this exercise __________ times a day. Hamstring, supine 1. Lie on your back (supine position). 2. Loop a belt or towel over the ball of your left / right foot. The ball of your foot is on the walking surface, right under your toes. 3. Straighten your left / right knee and slowly pull on the belt to raise your leg until you feel a gentle stretch behind your knee (hamstring). ? Do not let your knee bend while you do this. ? Keep your other leg flat on the floor. 4. Hold this position for __________ seconds. Repeat __________ times. Complete this exercise __________ times a day. Strengthening exercises These exercises build strength and endurance in your knee. Endurance is the ability to use your muscles for a long time, even after they get tired. Quadriceps, isometric This exercise stretches the muscles in front of your thigh (quadriceps) without moving your knee joint (isometric). 1. Lie on your back with your left / right leg extended and your other knee bent. Put a rolled towel or small pillow under your knee if told by your health care provider. 2. Slowly tense the muscles in the front of your left /   right thigh. You should see your kneecap slide up toward your hip or see increased dimpling just above the knee. This motion will push the back of the knee toward the floor. 3. For __________ seconds, hold the muscle as tight as you can without increasing your pain. 4. Relax the muscles slowly and completely. Repeat __________ times. Complete this exercise __________ times a day. Straight leg raises This exercise stretches the muscles in front  of your thigh (quadriceps) and the muscles that move your hips (hip flexors). 1. Lie on your back with your left / right leg extended and your other knee bent. 2. Tense the muscles in the front of your left / right thigh. You should see your kneecap slide up or see increased dimpling just above the knee. Your thigh may even shake a bit. 3. Keep these muscles tight as you raise your leg 4-6 inches (10-15 cm) off the floor. Do not let your knee bend. 4. Hold this position for __________ seconds. 5. Keep these muscles tense as you lower your leg. 6. Relax your muscles slowly and completely after each repetition. Repeat __________ times. Complete this exercise __________ times a day. Hamstring, isometric 1. Lie on your back on a firm surface. 2. Bend your left / right knee about __________ degrees. 3. Dig your left / right heel into the surface as if you are trying to pull it toward your buttocks. Tighten the muscles in the back of your thighs (hamstring) to "dig" as hard as you can without increasing any pain. 4. Hold this position for __________ seconds. 5. Release the tension gradually and allow your muscles to relax completely for __________ seconds after each repetition. Repeat __________ times. Complete this exercise __________ times a day. Hamstring curls If told by your health care provider, do this exercise while wearing ankle weights. Begin with __________ lb weights. Then increase the weight by 1 lb (0.5 kg) increments. Do not wear ankle weights that are more than __________ lb. 1. Lie on your abdomen with your legs straight. 2. Bend your left / right knee as far as you can without feeling pain. Keep your hips flat against the floor. 3. Hold this position for __________ seconds. 4. Slowly lower your leg to the starting position. Repeat __________ times. Complete this exercise __________ times a day. Squats This exercise strengthens the muscles in front of your thigh and knee  (quadriceps). 1. Stand in front of a table, with your feet and knees pointing straight ahead. You may rest your hands on the table for balance but not for support. 2. Slowly bend your knees and lower your hips like you are going to sit in a chair. ? Keep your weight over your heels, not over your toes. ? Keep your lower legs upright so they are parallel with the table legs. ? Do not let your hips go lower than your knees. ? Do not bend lower than told by your health care provider. ? If your knee pain increases, do not bend as low. 3. Hold the squat position for __________ seconds. 4. Slowly push with your legs to return to standing. Do not use your hands to pull yourself to standing. Repeat __________ times. Complete this exercise __________ times a day. Wall slides This exercise strengthens the muscles in front of your thigh and knee (quadriceps). 1. Lean your back against a smooth wall or door, and walk your feet out 18-24 inches (46-61 cm) from it. 2. Place your feet hip-width apart. 3.   Slowly slide down the wall or door until your knees bend __________ degrees. Keep your knees over your heels, not over your toes. Keep your knees in line with your hips. 4. Hold this position for __________ seconds. Repeat __________ times. Complete this exercise __________ times a day. Straight leg raises This exercise strengthens the muscles that rotate the leg at the hip and move it away from your body (hip abductors). 1. Lie on your side with your left / right leg in the top position. Lie so your head, shoulder, knee, and hip line up. You may bend your bottom knee to help you keep your balance. 2. Roll your hips slightly forward so your hips are stacked directly over each other and your left / right knee is facing forward. 3. Leading with your heel, lift your top leg 4-6 inches (10-15 cm). You should feel the muscles in your outer hip lifting. ? Do not let your foot drift forward. ? Do not let your knee  roll toward the ceiling. 4. Hold this position for __________ seconds. 5. Slowly return your leg to the starting position. 6. Let your muscles relax completely after each repetition. Repeat __________ times. Complete this exercise __________ times a day. Straight leg raises This exercise stretches the muscles that move your hips away from the front of the pelvis (hip extensors). 1. Lie on your abdomen on a firm surface. You can put a pillow under your hips if that is more comfortable. 2. Tense the muscles in your buttocks and lift your left / right leg about 4-6 inches (10-15 cm). Keep your knee straight as you lift your leg. 3. Hold this position for __________ seconds. 4. Slowly lower your leg to the starting position. 5. Let your leg relax completely after each repetition. Repeat __________ times. Complete this exercise __________ times a day. This information is not intended to replace advice given to you by your health care provider. Make sure you discuss any questions you have with your health care provider. Document Revised: 04/23/2018 Document Reviewed: 04/23/2018 Elsevier Patient Education  2020 [employer] Inc[employer]  

## 2020-03-23 ENCOUNTER — Other Ambulatory Visit: Payer: Self-pay

## 2020-03-23 ENCOUNTER — Ambulatory Visit (INDEPENDENT_AMBULATORY_CARE_PROVIDER_SITE_OTHER): Payer: 59 | Admitting: Orthopaedic Surgery

## 2020-03-23 ENCOUNTER — Encounter: Payer: Self-pay | Admitting: Orthopaedic Surgery

## 2020-03-23 VITALS — BP 137/72 | HR 66 | Ht 67.0 in | Wt 181.0 lb

## 2020-03-23 DIAGNOSIS — M25561 Pain in right knee: Secondary | ICD-10-CM

## 2020-03-23 DIAGNOSIS — G8929 Other chronic pain: Secondary | ICD-10-CM | POA: Diagnosis not present

## 2020-03-23 NOTE — Progress Notes (Signed)
Patient JQ:ZESPQZRA KATHLENE Hanson, female DOB:16-Nov-1960, 59 y.o. QTM:[id number]  Chief Complaint  Patient presents with  . Knee Pain    right knee pain still, same, wearing brace, no meds    HPI  Dana Hanson is a 59 y.o. female who has continued pain of the right knee with giving way.  She is using a knee sleeve. She has Naprosyn and has done her exercises.  I feel she has medial meniscus tear and needs MRI.  I will get MRI.   Body mass index is 28.35 kg/m.  ROS  Review of Systems  Constitutional: Positive for activity change.  Musculoskeletal: Positive for arthralgias, gait problem and joint swelling.  All other systems reviewed and are negative.   All other systems reviewed and are negative.  The following is a summary of the past history medically, past history surgically, known current medicines, social history and family history.  This information is gathered electronically by the computer from prior information and documentation.  I review this each visit and have found including this information at this point in the chart is beneficial and informative.    Past Medical History:  Diagnosis Date  . Gallstone pancreatitis   . Hyperlipidemia   . Right renal mass     Past Surgical History:  Procedure Laterality Date  . CESAREAN SECTION     x 2-tubal with last c-section  . CHOLECYSTECTOMY N/A 09/03/2015   Procedure: LAPAROSCOPIC CHOLECYSTECTOMY WITH INTRAOPERATIVE CHOLANGIOGRAM;  Surgeon: Mickeal Skinner, MD;  Location: [facility];  Service: General;  Laterality: N/A;  . COLONOSCOPY  08/14/2011   Procedure: COLONOSCOPY;  Surgeon: Daneil Dolin, MD;  Location: [facility];  Service: Endoscopy;  Laterality: N/A;  10:00  . HEMORRHOID SURGERY  11/10/2011   Procedure: HEMORRHOIDECTOMY;  Surgeon: Donato Heinz, MD;  Location: [facility];  Service: General;  Laterality: N/A;  . LAPAROSCOPIC GASTRIC SLEEVE RESECTION  01-2015  . LAPAROSCOPIC NEPHRECTOMY N/A 09/03/2015   Procedure:  LAPAROSCOPIC RADICAL NEPHRECTOMY;  Surgeon: Ardis Hughs, MD;  Location: [facility];  Service: Urology;  Laterality: N/A;  . TUBAL LIGATION     with last c-section    Family History  Problem Relation Age of Onset  . Colon cancer Neg Hx   . Liver disease Neg Hx   . Anesthesia problems Neg Hx   . Hypotension Neg Hx   . Malignant hyperthermia Neg Hx   . Pseudochol deficiency Neg Hx     Social History Social History   Tobacco Use  . Smoking status: Never Smoker  . Smokeless tobacco: Never Used  Substance Use Topics  . Alcohol use: No  . Drug use: No    No Known Allergies  Current Outpatient Medications  Medication Sig Dispense Refill  . calcium carbonate (OS-CAL - DOSED IN MG OF ELEMENTAL CALCIUM) 1250 (500 Ca) MG tablet Take 1 tablet by mouth daily with breakfast.    . Cyanocobalamin (VITAMIN B-12 PO) Take 1 tablet by mouth daily.    . ferrous sulfate 325 (65 FE) MG tablet Take 325 mg by mouth daily with breakfast.    . Multiple Vitamin (MULITIVITAMIN WITH MINERALS) TABS Take 1 tablet by mouth daily.    . Multiple Vitamins-Minerals (HAIR/SKIN/NAILS) TABS Take 3 tablets by mouth daily.     . naproxen (NAPROSYN) 500 MG tablet Take 1 tablet (500 mg total) by mouth 2 (two) times daily with a meal. 60 tablet 5   No current facility-administered medications for this visit.  Physical Exam  Blood pressure 137/72, pulse 66, height 5\' 7"  (1.702 m), weight 181 lb (82.1 kg), last menstrual period 10/27/2013.  Constitutional: overall normal hygiene, normal nutrition, well developed, normal grooming, normal body habitus. Assistive device:none  Musculoskeletal: gait and station Limp right, muscle tone and strength are normal, no tremors or atrophy is present.  .  Neurological: coordination overall normal.  Deep tendon reflex/nerve stretch intact.  Sensation normal.  Cranial nerves II-XII intact.   Skin:   Normal overall no scars, lesions, ulcers or rashes. No  psoriasis.  Psychiatric: Alert and oriented x 3.  Recent memory intact, remote memory unclear.  Normal mood and affect. Well groomed.  Good eye contact.  Cardiovascular: overall no swelling, no varicosities, no edema bilaterally, normal temperatures of the legs and arms, no clubbing, cyanosis and good capillary refill.  Lymphatic: palpation is normal.  Right knee with effusion, crepitus, ROM 0 to 110, limp right, positive medial [place], [place] intact.  All other systems reviewed and are negative   The patient has been educated about the nature of the problem(s) and counseled on treatment options.  The patient appeared to understand what I have discussed and is in agreement with it.  Encounter Diagnosis  Name Primary?  . Chronic pain of right knee Yes    PLAN Call if any problems.  Precautions discussed.  Continue current medications.   Return to clinic 2 weeks   Get MRI of the knee on the right.  Electronically [place], MD 9/7/20219:15 AM

## 2020-03-23 NOTE — Addendum Note (Signed)
Addended by: Brand Males E on: 03/23/2020 09:38 AM   Modules accepted: Orders

## 2020-04-06 ENCOUNTER — Ambulatory Visit: Payer: 59 | Admitting: Orthopaedic Surgery

## 2020-04-08 ENCOUNTER — Ambulatory Visit (HOSPITAL_COMMUNITY)
Admission: RE | Admit: 2020-04-08 | Discharge: 2020-04-08 | Disposition: A | Discharge status: Home / Self Care | Payer: 59 | Source: Ambulatory Visit | Attending: Orthopaedic Surgery | Admitting: Orthopaedic Surgery

## 2020-04-08 ENCOUNTER — Other Ambulatory Visit: Payer: Self-pay

## 2020-04-08 DIAGNOSIS — M25561 Pain in right knee: Secondary | ICD-10-CM | POA: Insufficient documentation

## 2020-04-08 DIAGNOSIS — G8929 Other chronic pain: Secondary | ICD-10-CM | POA: Insufficient documentation

## 2020-04-08 IMAGING — MR MR KNEE*R* W/O CM
7 series · 40 of 40 positions shown · non-contrast
Comparison: X-ray [DATE]

CLINICAL DATA: Right knee pain, twisting injury

EXAM:
MRI OF THE RIGHT KNEE WITHOUT CONTRAST
TECHNIQUE: Multiplanar, multisequence MR imaging of the knee was performed. No
intravenous contrast was administered.

[Series 8: T2 fat-sat · axial · right · 4.0mm · 0.47mm/px · z∈[-96,+27]mm · 5 of 26 slices shown (1 of 3)]
[im 1/26]
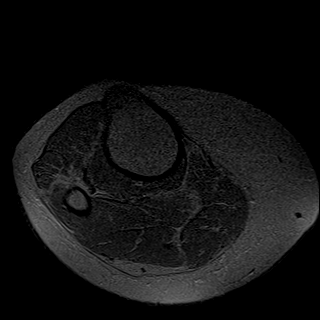
[im 7/26]
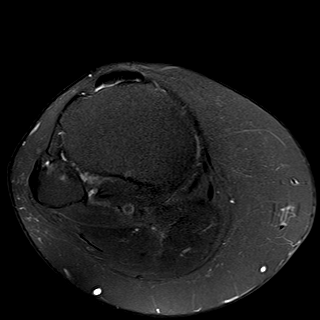
[im 13/26]
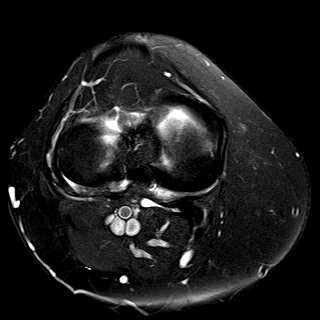
[im 19/26]
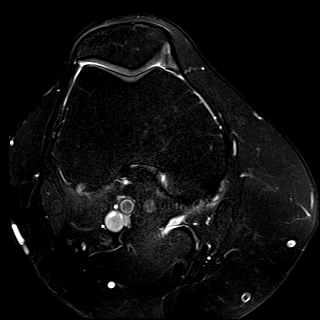
[im 26/26]
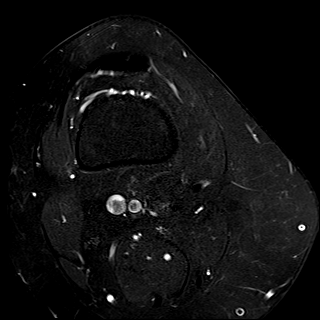

[Series 9: T1 · coronal · right · 4.0mm · 0.59mm/px · 6 of 24 slices shown]
[im 1/24]
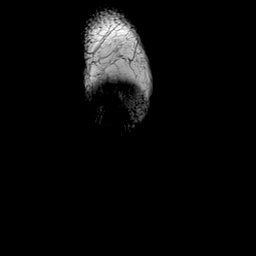
[im 5/24]
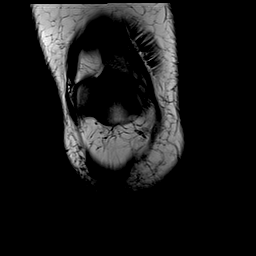
[im 10/24]
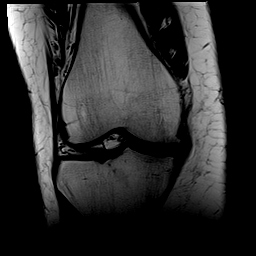
[im 14/24]
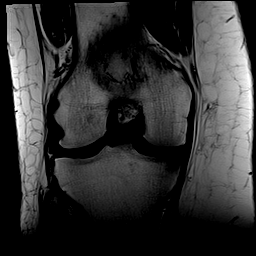
[im 19/24]
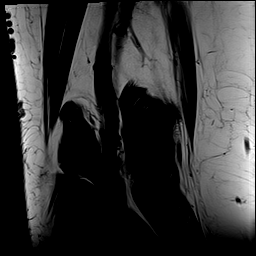
[im 24/24]
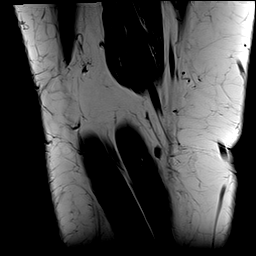

[Series 10: T2 fat-sat · coronal · right · 4.0mm · 0.59mm/px · 6 of 27 slices shown (2 of 3)]
[im 1/27]
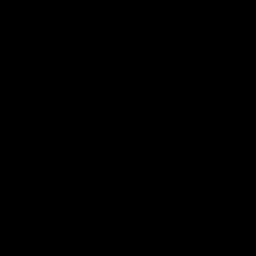
[im 6/27]
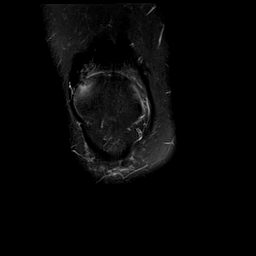
[im 11/27]
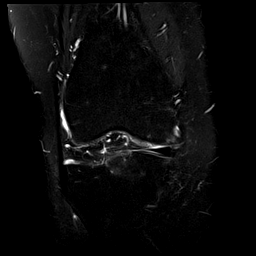
[im 16/27]
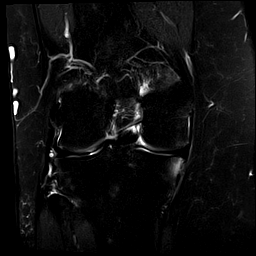
[im 21/27]
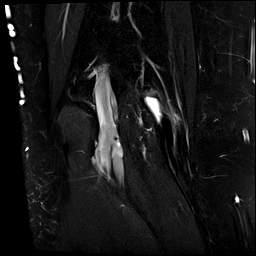
[im 27/27]
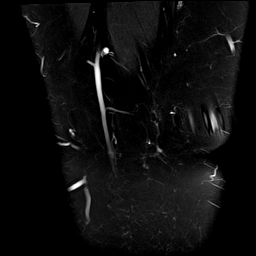

[Series 11: PD fat-sat · coronal · right · 4.0mm · 0.59mm/px · 7 of 28 slices shown (1 of 2)]
[im 1/28]
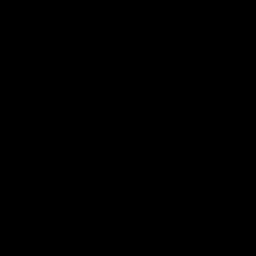
[im 5/28]
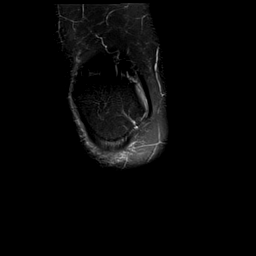
[im 10/28]
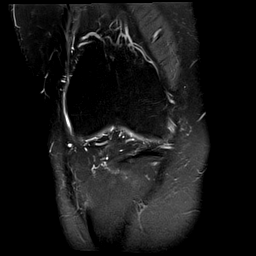
[im 14/28]
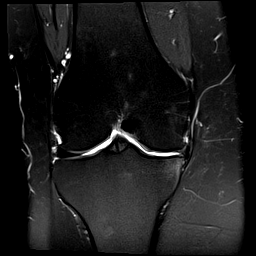
[im 19/28]
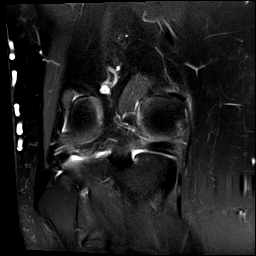
[im 23/28]
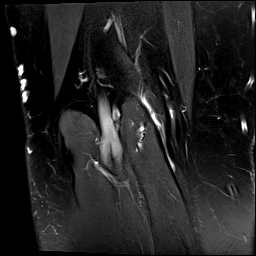
[im 28/28]
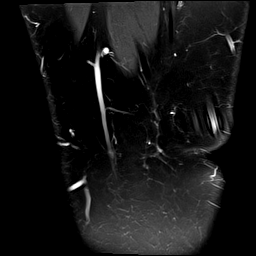

[Series 12: PD fat-sat · sagittal · right · 3.0mm · 0.59mm/px · 7 of 28 slices shown (2 of 2)]
[im 1/28]
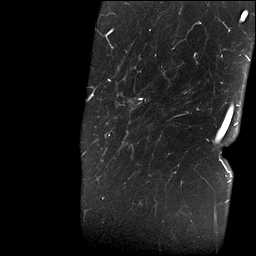
[im 5/28]
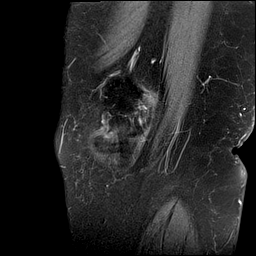
[im 10/28]
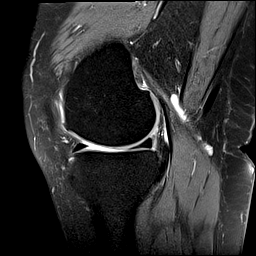
[im 14/28]
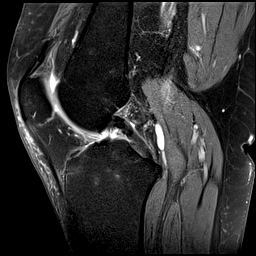
[im 19/28]
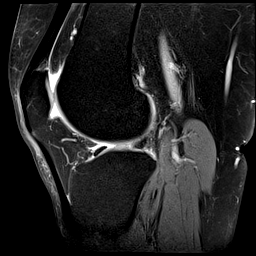
[im 23/28]
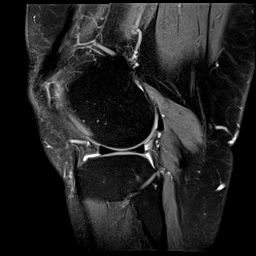
[im 28/28]
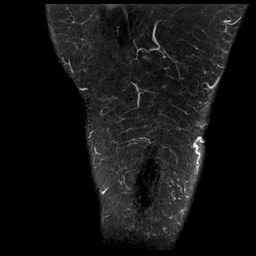

[Series 13: T2 fat-sat · sagittal · right · 3.0mm · 0.59mm/px · 7 of 28 slices shown (3 of 3)]
[im 1/28]
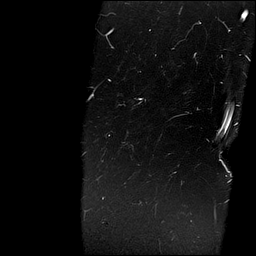
[im 5/28]
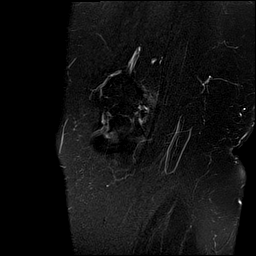
[im 10/28]
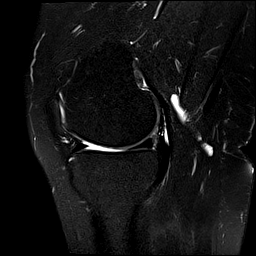
[im 14/28]
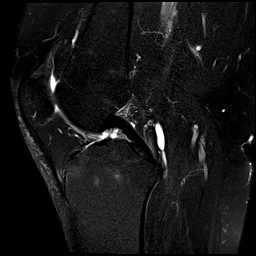
[im 19/28]
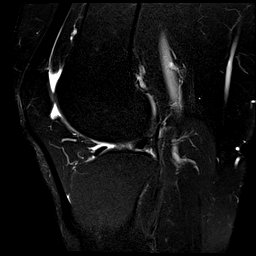
[im 23/28]
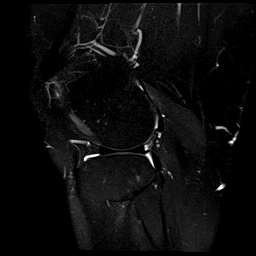
[im 28/28]
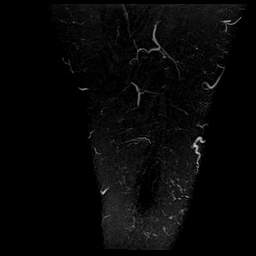

[Series 14: PD · coronal · right · 2.0mm · 0.47mm/px · 2 of 10 slices shown]
[im 1/10]
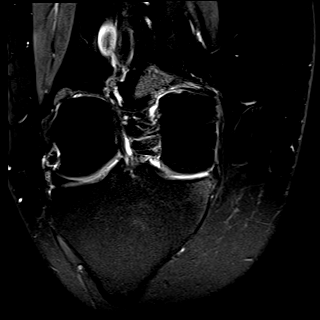
[im 10/10]
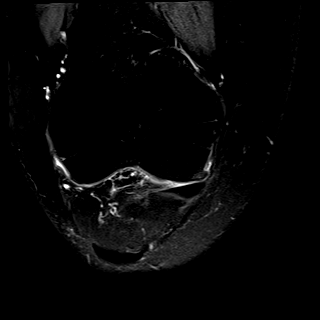

[40 of 40 positions shown; findings below may reference images not displayed]

FINDINGS: MENISCI

Medial meniscus: Irregular oblique tear extending to the inferior
articular surface at the posterior horn-body junction (series 12,
image 22). The midbody is blunted and diminutive in appearance
(series 11, image 14). No displaced meniscal flap or fragment. Mild
meniscal extrusion.

Lateral meniscus:  Intact.

LIGAMENTS

Cruciates:  Intact ACL and PCL.

Collaterals: Medial collateral ligament is intact. Lateral
collateral ligament complex is intact.

CARTILAGE

Patellofemoral:  No chondral defect.

Medial: Moderate chondral thinning of the weight-bearing surfaces,
most pronounced at the periphery of the medial tibial plateau where
there is underlying subchondral marrow edema.

Lateral:  No chondral defect.

Joint:  Trace joint effusion.  Unremarkable fat pads.

Popliteal Fossa:  Small Baker's cyst.  Intact popliteus tendon.

Extensor Mechanism:  Intact quadriceps tendon and patellar tendon.

Bones: Subchondral marrow edema at the peripheral aspect of the
medial tibial plateau. Mild medial compartment marginal
osteophytosis. Osseous structures are otherwise within normal
limits. No fracture. No malalignment.

Other: None.
IMPRESSION: 1. Complex medial meniscal tear with prominent oblique component at
the posterior horn-body junction.
2. Moderate chondral thinning of the weight-bearing surfaces of the
medial compartment, most pronounced at the periphery of the medial
tibial plateau where there is underlying subchondral marrow edema.
3. Trace joint effusion and small Baker's cyst.

## 2020-04-13 ENCOUNTER — Encounter: Payer: Self-pay | Admitting: Orthopaedic Surgery

## 2020-04-13 ENCOUNTER — Other Ambulatory Visit: Payer: Self-pay

## 2020-04-13 ENCOUNTER — Ambulatory Visit (INDEPENDENT_AMBULATORY_CARE_PROVIDER_SITE_OTHER): Payer: 59 | Admitting: Orthopaedic Surgery

## 2020-04-13 VITALS — Ht 67.0 in | Wt 185.0 lb

## 2020-04-13 DIAGNOSIS — M25561 Pain in right knee: Secondary | ICD-10-CM | POA: Diagnosis not present

## 2020-04-13 DIAGNOSIS — G8929 Other chronic pain: Secondary | ICD-10-CM | POA: Diagnosis not present

## 2020-04-13 NOTE — Progress Notes (Signed)
Patient Dana Hanson, female DOB:02-Sep-1960, 59 y.o. BJY:[id number]  Chief Complaint  Patient presents with  . Knee Pain    HPI  Dana Hanson is a 59 y.o. female who has left knee pain and giving way.  She had MRI which showed: IMPRESSION: 1. Complex medial meniscal tear with prominent oblique component at the posterior horn-body junction. 2. Moderate chondral thinning of the weight-bearing surfaces of the medial compartment, most pronounced at the periphery of the medial tibial plateau where there is underlying subchondral marrow edema. 3. Trace joint effusion and small Baker's cyst.  I have explained the findings to her.  I have recommended arthroscopy of the knee.   Body mass index is 28.98 kg/m.  ROS  Review of Systems  Constitutional: Positive for activity change.  Musculoskeletal: Positive for arthralgias, gait problem and joint swelling.  All other systems reviewed and are negative.   All other systems reviewed and are negative.  The following is a summary of the past history medically, past history surgically, known current medicines, social history and family history.  This information is gathered electronically by the computer from prior information and documentation.  I review this each visit and have found including this information at this point in the chart is beneficial and informative.    Past Medical History:  Diagnosis Date  . Gallstone pancreatitis   . Hyperlipidemia   . Right renal mass     Past Surgical History:  Procedure Laterality Date  . CESAREAN SECTION     x 2-tubal with last c-section  . CHOLECYSTECTOMY N/A 09/03/2015   Procedure: LAPAROSCOPIC CHOLECYSTECTOMY WITH INTRAOPERATIVE CHOLANGIOGRAM;  Surgeon: Mickeal Skinner, MD;  Location: [facility];  Service: General;  Laterality: N/A;  . COLONOSCOPY  08/14/2011   Procedure: COLONOSCOPY;  Surgeon: Daneil Dolin, MD;  [facility]: [facility];  Service: Endoscopy;  Laterality: N/A;   10:00  . HEMORRHOID SURGERY  11/10/2011   Procedure: HEMORRHOIDECTOMY;  Surgeon: Donato Heinz, MD;  Location: [facility];  Service: General;  Laterality: N/A;  . LAPAROSCOPIC GASTRIC SLEEVE RESECTION  01-2015  . LAPAROSCOPIC NEPHRECTOMY N/A 09/03/2015   Procedure: LAPAROSCOPIC RADICAL NEPHRECTOMY;  Surgeon: Ardis Hughs, MD;  Location: [facility];  Service: Urology;  Laterality: N/A;  . TUBAL LIGATION     with last c-section    Family History  Problem Relation Age of Onset  . Colon cancer Neg Hx   . Liver disease Neg Hx   . Anesthesia problems Neg Hx   . Hypotension Neg Hx   . Malignant hyperthermia Neg Hx   . Pseudochol deficiency Neg Hx     Social History Social History   Tobacco Use  . Smoking status: Never Smoker  . Smokeless tobacco: Never Used  Substance Use Topics  . Alcohol use: No  . Drug use: No    No Known Allergies  Current Outpatient Medications  Medication Sig Dispense Refill  . calcium carbonate (OS-CAL - DOSED IN MG OF ELEMENTAL CALCIUM) 1250 (500 Ca) MG tablet Take 1 tablet by mouth daily with breakfast.    . Cyanocobalamin (VITAMIN B-12 PO) Take 1 tablet by mouth daily.    . ferrous sulfate 325 (65 FE) MG tablet Take 325 mg by mouth daily with breakfast.    . Multiple Vitamin (MULITIVITAMIN WITH MINERALS) TABS Take 1 tablet by mouth daily.    . Multiple Vitamins-Minerals (HAIR/SKIN/NAILS) TABS Take 3 tablets by mouth daily.     . naproxen (NAPROSYN) 500 MG tablet  Take 1 tablet (500 mg total) by mouth 2 (two) times daily with a meal. 60 tablet 5   No current facility-administered medications for this visit.     Physical Exam  Height 5\' 7"  (1.702 m), weight 185 lb (83.9 kg), last menstrual period 10/27/2013.  Constitutional: overall normal hygiene, normal nutrition, well developed, normal grooming, normal body habitus. Assistive device:none  Musculoskeletal: gait and station Limp right, muscle tone and strength are normal, no tremors or atrophy is  present.  .  Neurological: coordination overall normal.  Deep tendon reflex/nerve stretch intact.  Sensation normal.  Cranial nerves II-XII intact.   Skin:   Normal overall no scars, lesions, ulcers or rashes. No psoriasis.  Psychiatric: Alert and oriented x 3.  Recent memory intact, remote memory unclear.  Normal mood and affect. Well groomed.  Good eye contact.  Cardiovascular: overall no swelling, no varicosities, no edema bilaterally, normal temperatures of the legs and arms, no clubbing, cyanosis and good capillary refill.  Lymphatic: palpation is normal.  Right knee with effusion, pain, ROM 0 to 110, positive medial McMurray, limp right, NV intact.  No distal edema.  All other systems reviewed and are negative   The patient has been educated about the nature of the problem(s) and counseled on treatment options.  The patient appeared to understand what I have discussed and is in agreement with it.  Encounter Diagnosis  Name Primary?  . Chronic pain of right knee Yes    PLAN Call if any problems.  Precautions discussed.  Continue current medications.   Return to clinic for arthroscopy of knee.    Electronically Signed Sanjuana Kava, MD 9/28/202110:08 AM

## 2020-04-14 ENCOUNTER — Encounter: Payer: Self-pay | Admitting: Orthopedic Surgery

## 2020-04-14 ENCOUNTER — Ambulatory Visit (INDEPENDENT_AMBULATORY_CARE_PROVIDER_SITE_OTHER): Payer: 59 | Admitting: Orthopedic Surgery

## 2020-04-14 VITALS — BP 124/72 | HR 75 | Ht 67.0 in | Wt 185.0 lb

## 2020-04-14 DIAGNOSIS — S83231A Complex tear of medial meniscus, current injury, right knee, initial encounter: Secondary | ICD-10-CM | POA: Diagnosis not present

## 2020-04-14 DIAGNOSIS — M1711 Unilateral primary osteoarthritis, right knee: Secondary | ICD-10-CM | POA: Diagnosis not present

## 2020-04-14 NOTE — Progress Notes (Signed)
New Patient Visit  Assessment: Dana Hanson is a 59 y.o. female with the following: 1.  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  Moderate medial right knee OA; underlying bony edema within the medial tibial plateau  Plan: I had an extensive discussion with Dana Hanson in clinic today in regards to her right knee.  I reviewed the x-rays, as well as the MRI which demonstrates moderate degenerative changes in the right knee, specifically within the medial compartment.  Based on the presence of the arthritis, the medial meniscus tear is most likely degenerative.  As a result, knee arthroscopy would not improve her symptoms, and in many cases it can exacerbate her symptoms.  As a result, I recommended against knee arthroscopy.  In addition, the patient states that she is doing quite well at this time.  I encouraged her to remain as active as possible.  If she has any issues in her knee, she can consider taking naproxen as needed.  She will also plan to use her knee compression brace when she is active.  If her knee continues to bother her, she can consider returning to clinic for a steroid injection.  All of her questions were answered and she is amenable to this plan.  Follow-up: Return if symptoms worsen or fail to improve.  Subjective:  Chief Complaint  Patient presents with  . Knee Pain    surgical consult right knee    History of Present Illness: Dana Hanson is a 59 y.o. female who presents for evaluation of her right knee.  She has previously been evaluated by my partner, Dr. Luna Hanson, and presents today for further discussion regarding surgery.  Briefly, she for started to have knee pain and swelling approximately 6 months ago.  She noted a twisting event at that time, while walking her dog.  Her knee immediately began to swell.  In the subsequent weeks, her pain and swelling gradually improved.  She was back to doing all of her usual activities and was pleased with her  improvements.  However, she had another twisting event a couple of months ago, which caused a worsening of her symptoms.  Her pain is localized to the medial aspect of her right knee.  During today's visit, she states that her knee pain is very good at this time.  She will take naproxen only as needed.  She has not been as active recently, but hopes to return to [place].  She also has a knee sleeve, which she states she wears occasionally.  She has not participated in any physical therapy.  She has not had a steroid injection recently.  Review of Systems: No fevers or chills No numbness or tingling No chest pain No shortness of breath  Medical History:  Past Medical History:  Diagnosis Date  . Gallstone pancreatitis   . Hyperlipidemia   . Right renal mass     Past Surgical History:  Procedure Laterality Date  . CESAREAN SECTION     x 2-tubal with last c-section  . CHOLECYSTECTOMY N/A 09/03/2015   Procedure: LAPAROSCOPIC CHOLECYSTECTOMY WITH INTRAOPERATIVE CHOLANGIOGRAM;  Surgeon: Mickeal Skinner, MD;  Location: [facility];  Service: General;  Laterality: N/A;  . COLONOSCOPY  08/14/2011   Procedure: COLONOSCOPY;  Surgeon: Daneil Dolin, MD;  [facility];  Service: Endoscopy;  Laterality: N/A;  10:00  . HEMORRHOID SURGERY  11/10/2011   Procedure: HEMORRHOIDECTOMY;  Surgeon: Donato Heinz,  MD;  Location: [facility];  Service: General;  Laterality: N/A;  . [place] RESECTION  01-2015  . LAPAROSCOPIC NEPHRECTOMY N/A 09/03/2015   Procedure: LAPAROSCOPIC RADICAL NEPHRECTOMY;  Surgeon: Ardis Hughs, MD;  Location: [facility];  Service: Urology;  Laterality: N/A;  . TUBAL LIGATION     with last c-section    Family History  Problem Relation Age of Onset  . Colon cancer Neg Hx   . Liver disease Neg Hx   . Anesthesia problems Neg Hx   . Hypotension Neg Hx   . Malignant hyperthermia Neg Hx   . Pseudochol deficiency Neg Hx     Social History   Tobacco Use  . Smoking status: Never Smoker  . Smokeless tobacco: Never Used  Substance Use Topics  . Alcohol use: No  . Drug use: No    No Known Allergies  Current Meds  Medication Sig  . calcium carbonate (OS-CAL - DOSED IN MG OF ELEMENTAL CALCIUM) 1250 (500 Ca) MG tablet Take 1 tablet by mouth daily with breakfast.  . Cyanocobalamin (VITAMIN B-12 PO) Take 1 tablet by mouth daily.  . ferrous sulfate 325 (65 FE) MG tablet Take 325 mg by mouth daily with breakfast.  . Multiple Vitamin (MULITIVITAMIN WITH MINERALS) TABS Take 1 tablet by mouth daily.  . Multiple Vitamins-Minerals (HAIR/SKIN/NAILS) TABS Take 3 tablets by mouth daily.     Objective: BP 124/72   Pulse 75   Ht 5\' 7"  (1.702 m)   Wt 185 lb (83.9 kg)   LMP 10/27/2013   BMI 28.98 kg/m   Physical Exam:  General: Alert and oriented no acute distress Gait: Nonantalgic gait  Evaluation of the right knee demonstrates a mild effusion.  She does have tenderness to palpation along the medial joint line.  She has full range of motion from 0-130.  Negative Lachman.  No increased instability with varus and valgus stress.  She does have some pain medially with hyperflexion.  Evaluation left knee demonstrates no effusion.  No tenderness palpation along the medial or lateral joint lines.  Full range of motion from 0-130.  Negative Lachman.  No increased instability with varus and valgus stress.  Sensation is intact distally bilateral.  Toes are warm and well-perfused.  IMAGING: I personally reviewed the following images:  X-ray of the right knee demonstrates neutral overall alignment.  There are some small osteophytes medially.  There is loss of joint space medially, with some subchondral sclerosis.  MRI of the right knee was evaluated in clinic today and demonstrates degenerative changes within the medial compartment.  There is underlying bony edema within the medial tibial plateau.  She has some  degenerative tearing of both the body and the posterior horn of the medial compartment.  New Medications:  No orders of the defined types were placed in this encounter.     Mordecai Rasmussen, MD  04/14/2020 9:18 AM

## 2020-12-27 ENCOUNTER — Other Ambulatory Visit: Payer: Self-pay

## 2020-12-27 ENCOUNTER — Emergency Department (HOSPITAL_COMMUNITY): Payer: 59

## 2020-12-27 ENCOUNTER — Emergency Department (HOSPITAL_COMMUNITY)
Admission: EM | Admit: 2020-12-27 | Discharge: 2020-12-27 | Disposition: A | Discharge status: Home / Self Care | Payer: 59 | Attending: Emergency Medicine | Admitting: Emergency Medicine

## 2020-12-27 ENCOUNTER — Encounter (HOSPITAL_COMMUNITY): Payer: Self-pay

## 2020-12-27 DIAGNOSIS — S51851A Open bite of right forearm, initial encounter: Secondary | ICD-10-CM | POA: Diagnosis not present

## 2020-12-27 DIAGNOSIS — S61451A Open bite of right hand, initial encounter: Secondary | ICD-10-CM | POA: Diagnosis not present

## 2020-12-27 DIAGNOSIS — S41151A Open bite of right upper arm, initial encounter: Secondary | ICD-10-CM | POA: Diagnosis present

## 2020-12-27 DIAGNOSIS — W540XXA Bitten by dog, initial encounter: Secondary | ICD-10-CM | POA: Diagnosis not present

## 2020-12-27 DIAGNOSIS — Y93K9 Activity, other involving animal care: Secondary | ICD-10-CM | POA: Insufficient documentation

## 2020-12-27 DIAGNOSIS — Z23 Encounter for immunization: Secondary | ICD-10-CM | POA: Diagnosis not present

## 2020-12-27 IMAGING — DX DG HUMERUS 2V *R*
2 series · 2 of 2 positions shown · non-contrast
Comparison: None.

CLINICAL DATA: Dog bite.

EXAM:
RIGHT HUMERUS - 2+ VIEW

[humerus ap]
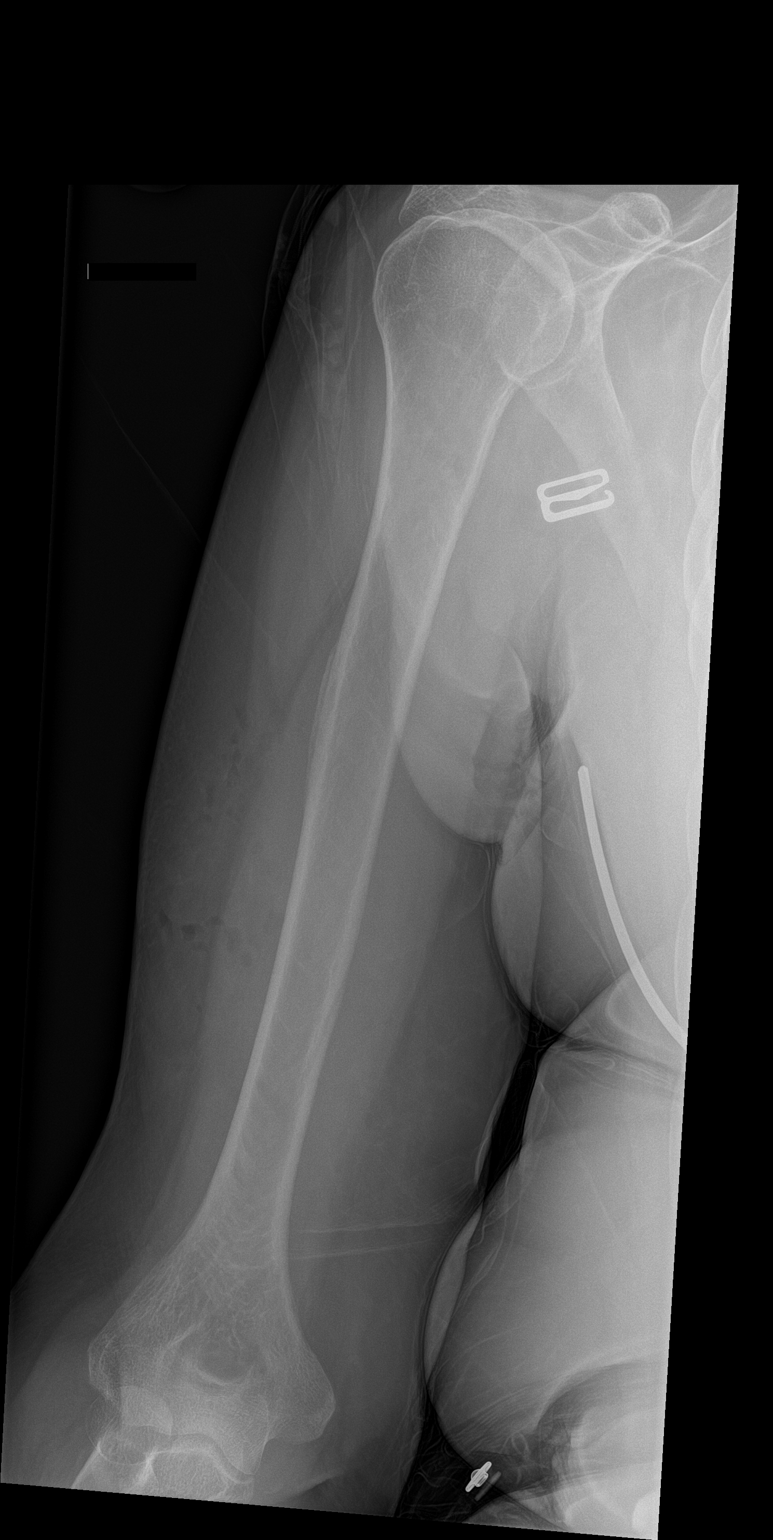

[humerus lat]
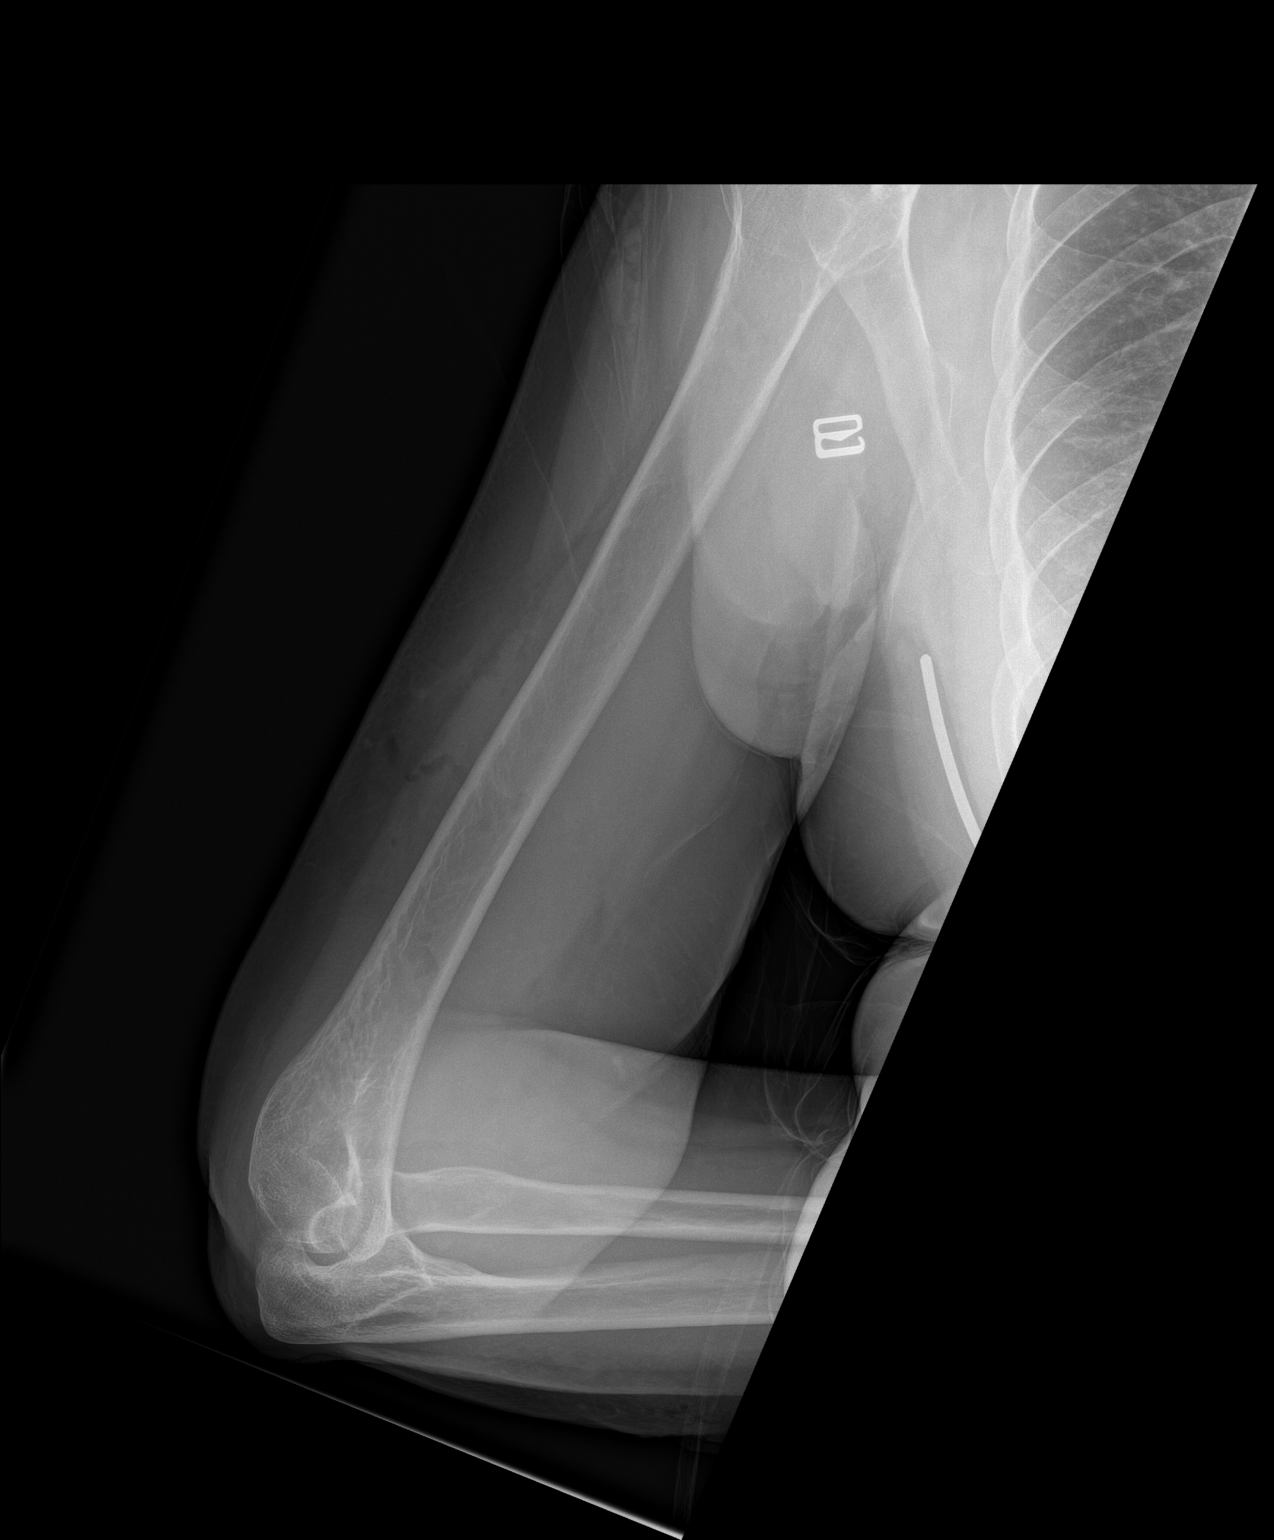

[2 of 2 positions shown; findings below may reference images not displayed]

FINDINGS: Negative for fracture or bone lesion

Gas in the soft tissues from laceration.
IMPRESSION: No skeletal abnormality.  Gas in the soft tissues.

## 2020-12-27 IMAGING — DX DG HAND COMPLETE 3+V*R*
3 series · 3 of 3 positions shown · non-contrast
Comparison: None.

CLINICAL DATA: Dog bite

EXAM:
RIGHT HAND - COMPLETE 3+ VIEW

[hand ap]
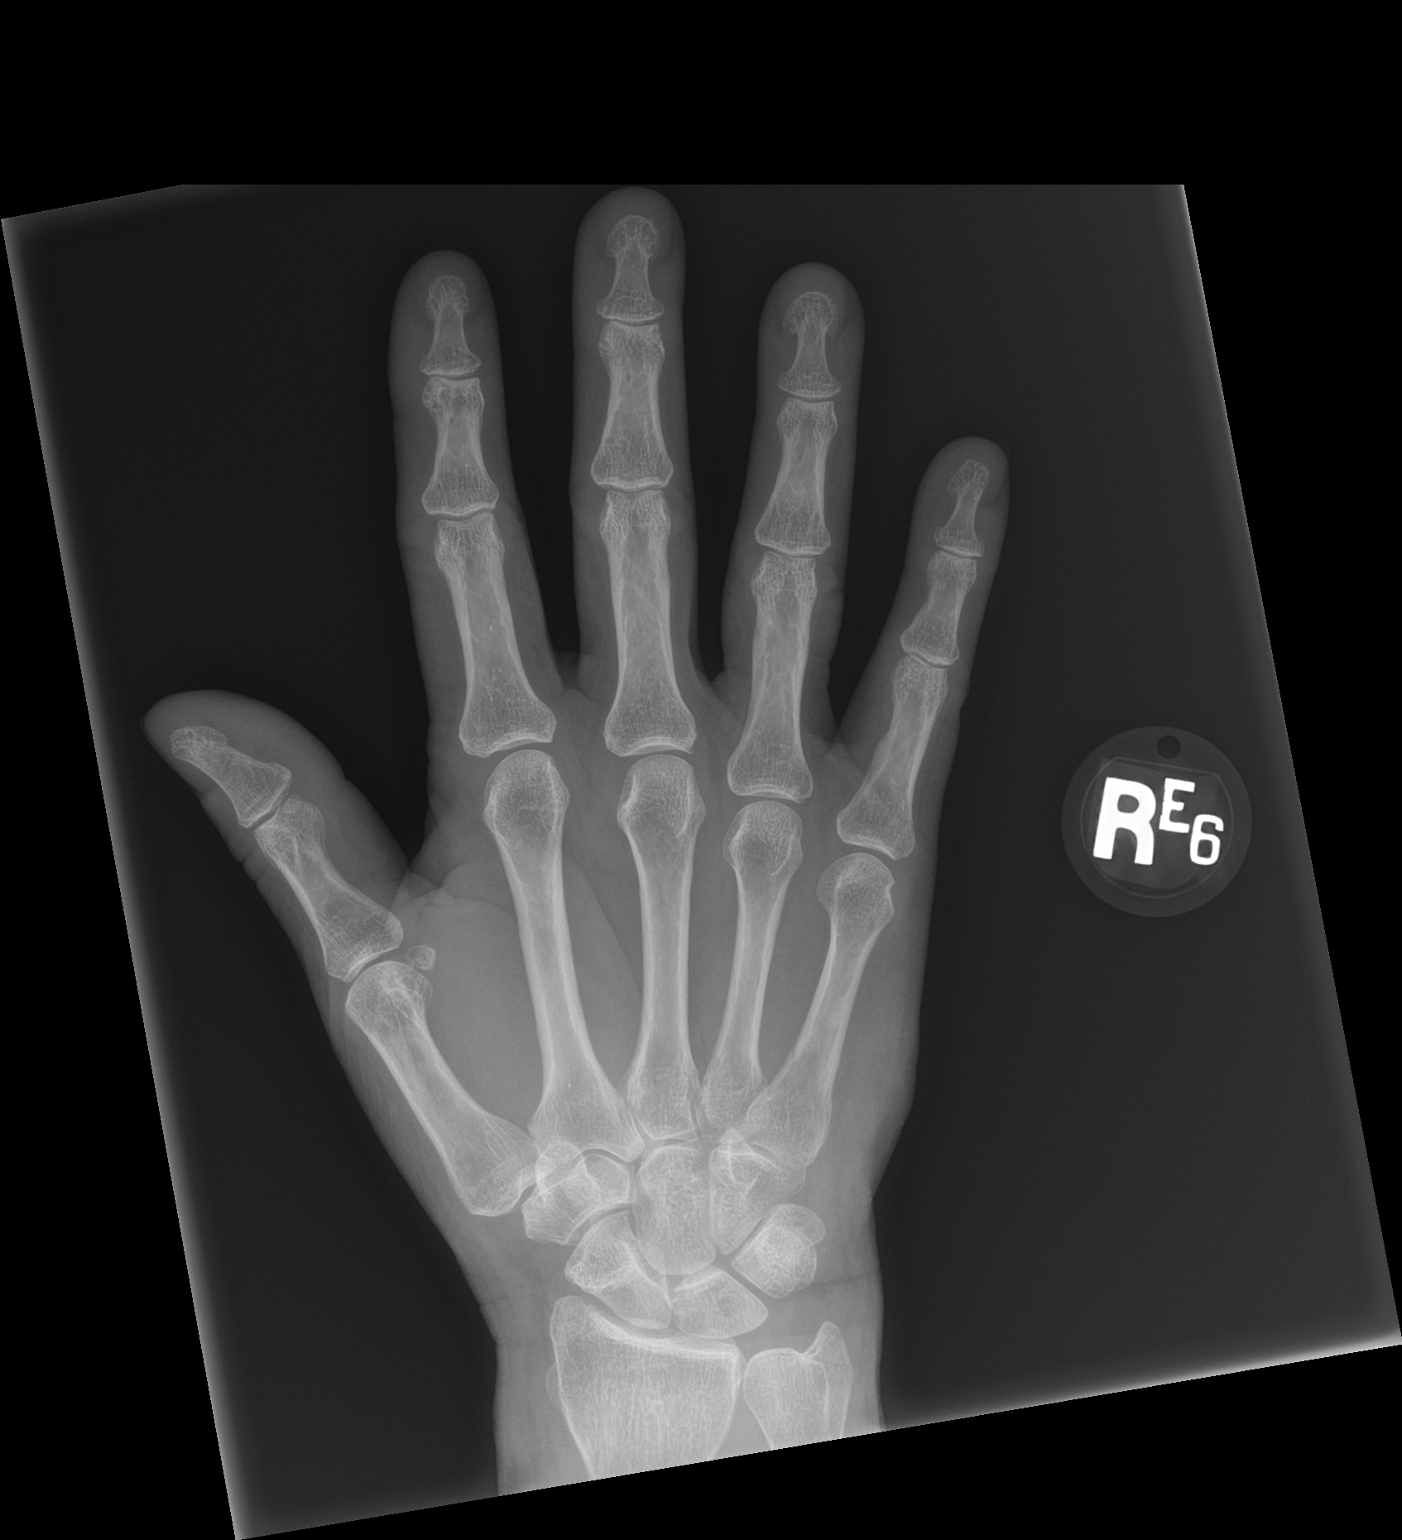

[hand obl]
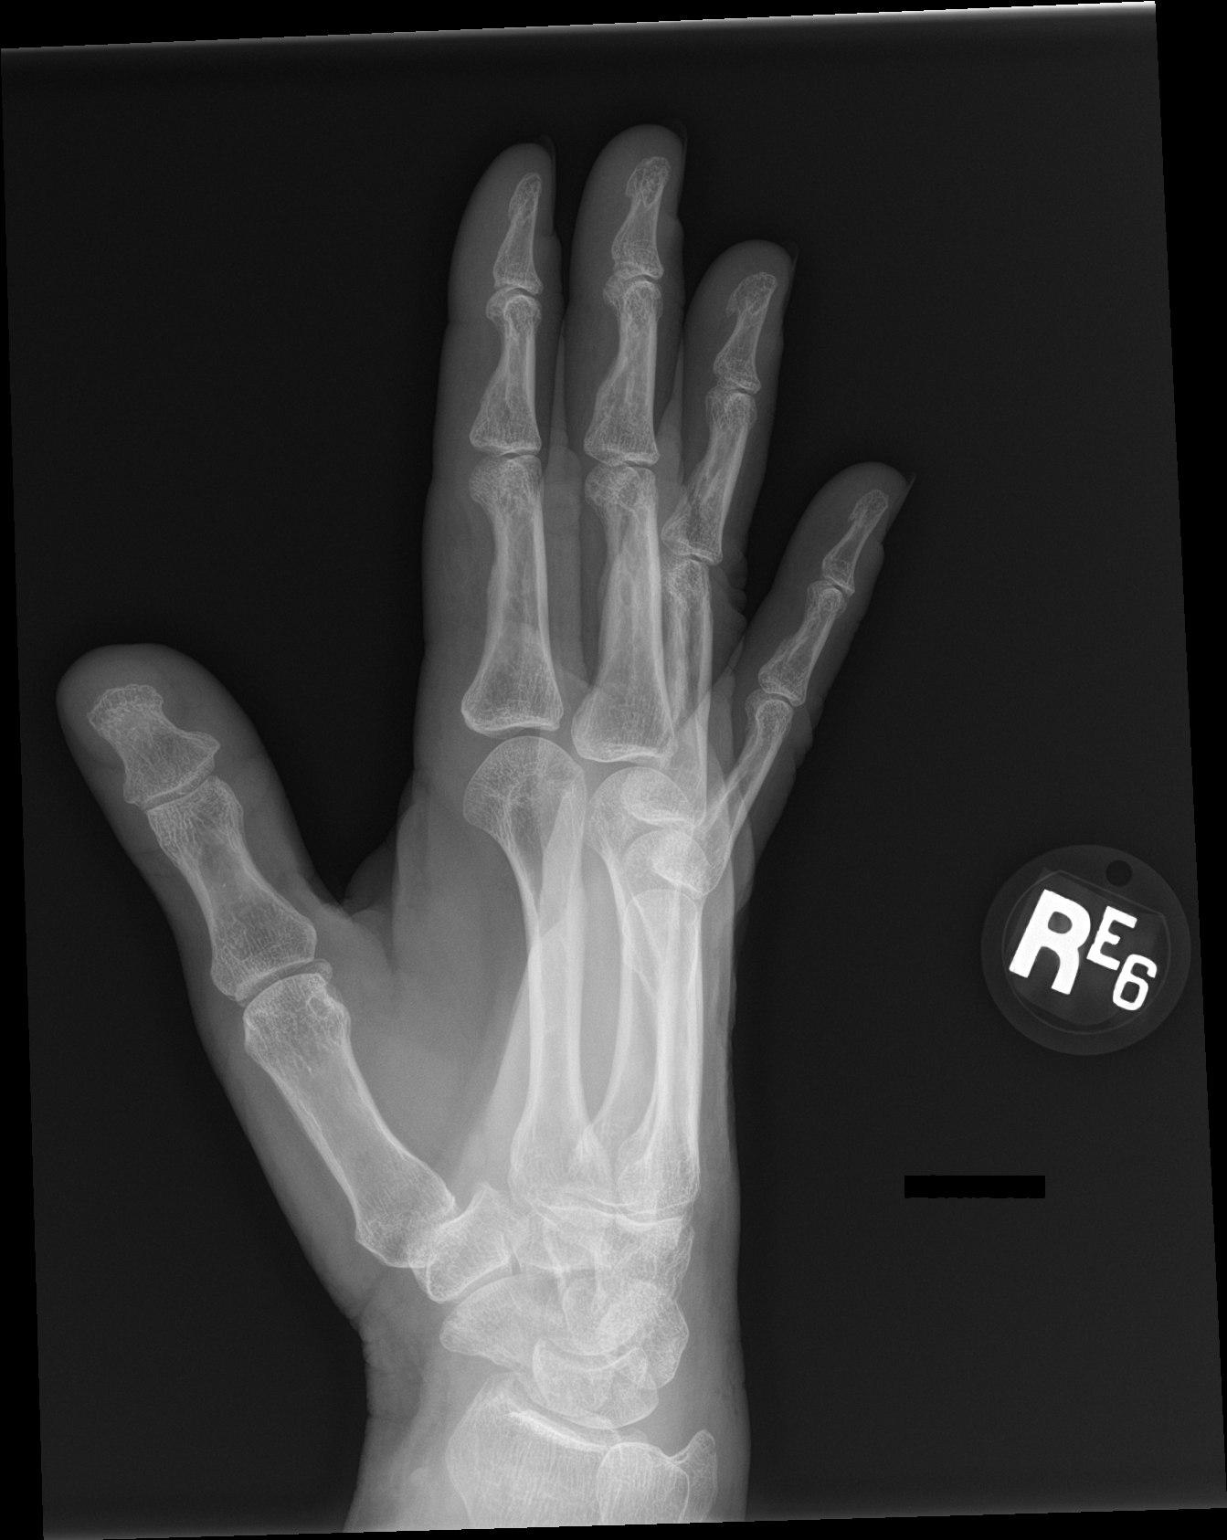

[hand lat]
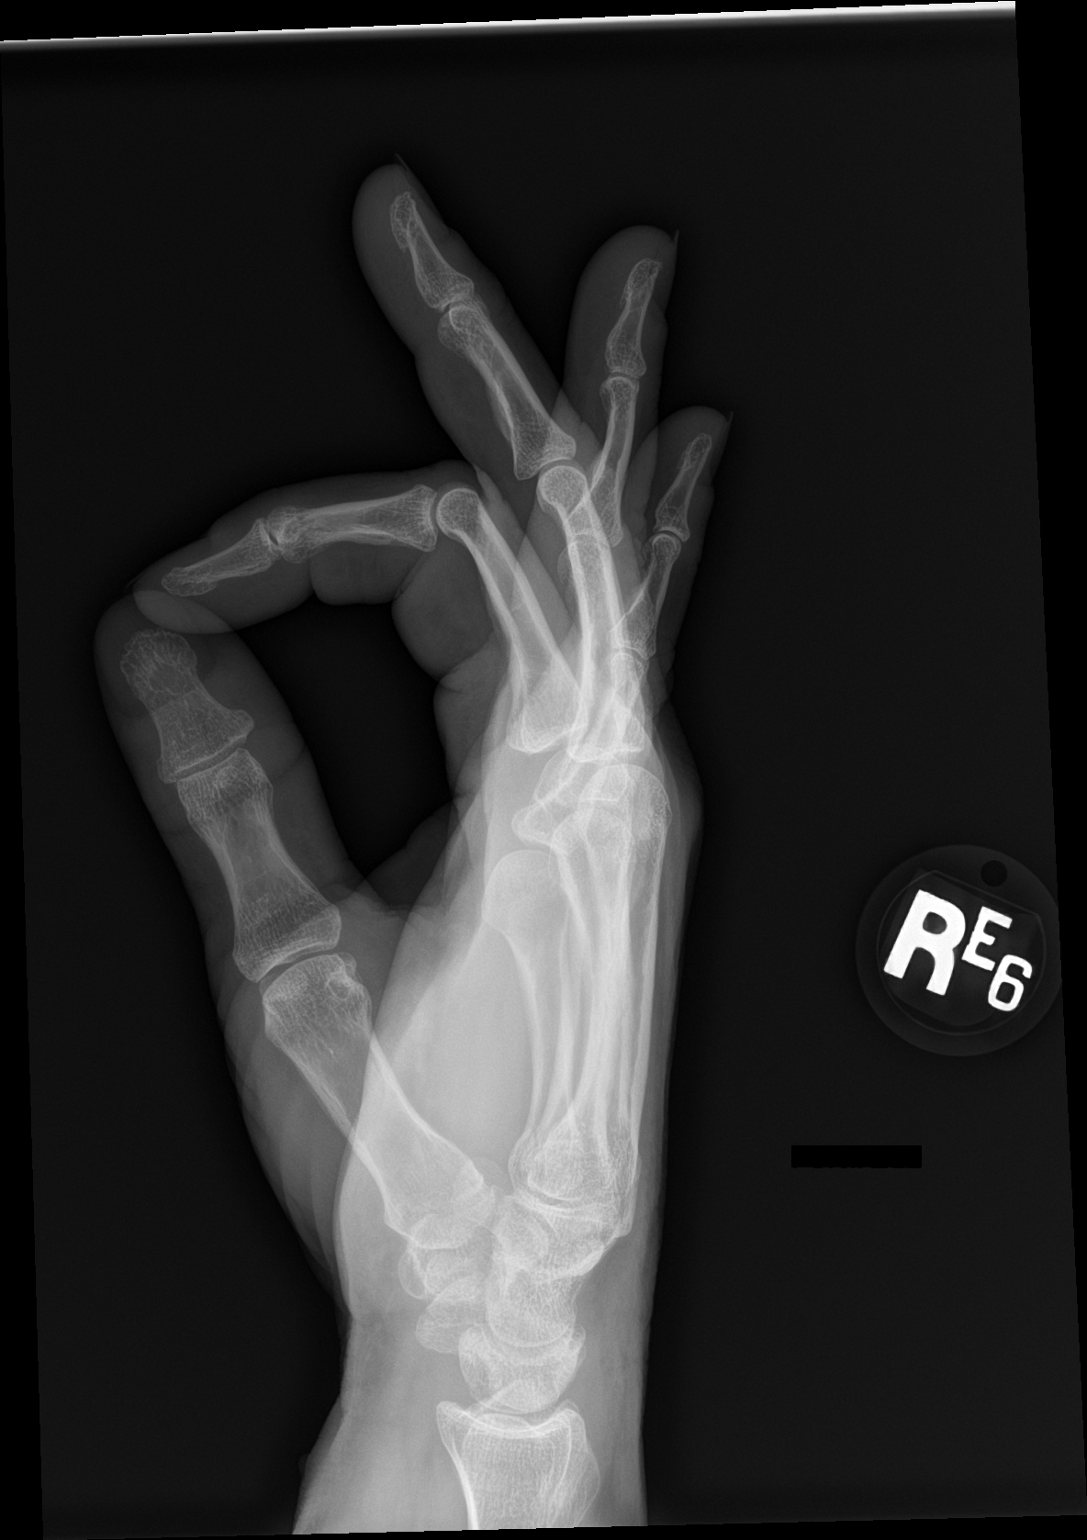

[3 of 3 positions shown; findings below may reference images not displayed]

FINDINGS: There is no evidence of fracture or dislocation. There is no
evidence of arthropathy or other focal bone abnormality. Soft
tissues are unremarkable.
IMPRESSION: Negative.

## 2020-12-27 IMAGING — DX DG FOREARM 2V*R*
2 series · 2 of 2 positions shown · non-contrast
Comparison: None.

CLINICAL DATA: Dog bite

EXAM:
RIGHT FOREARM - 2 VIEW

[forearm ap]
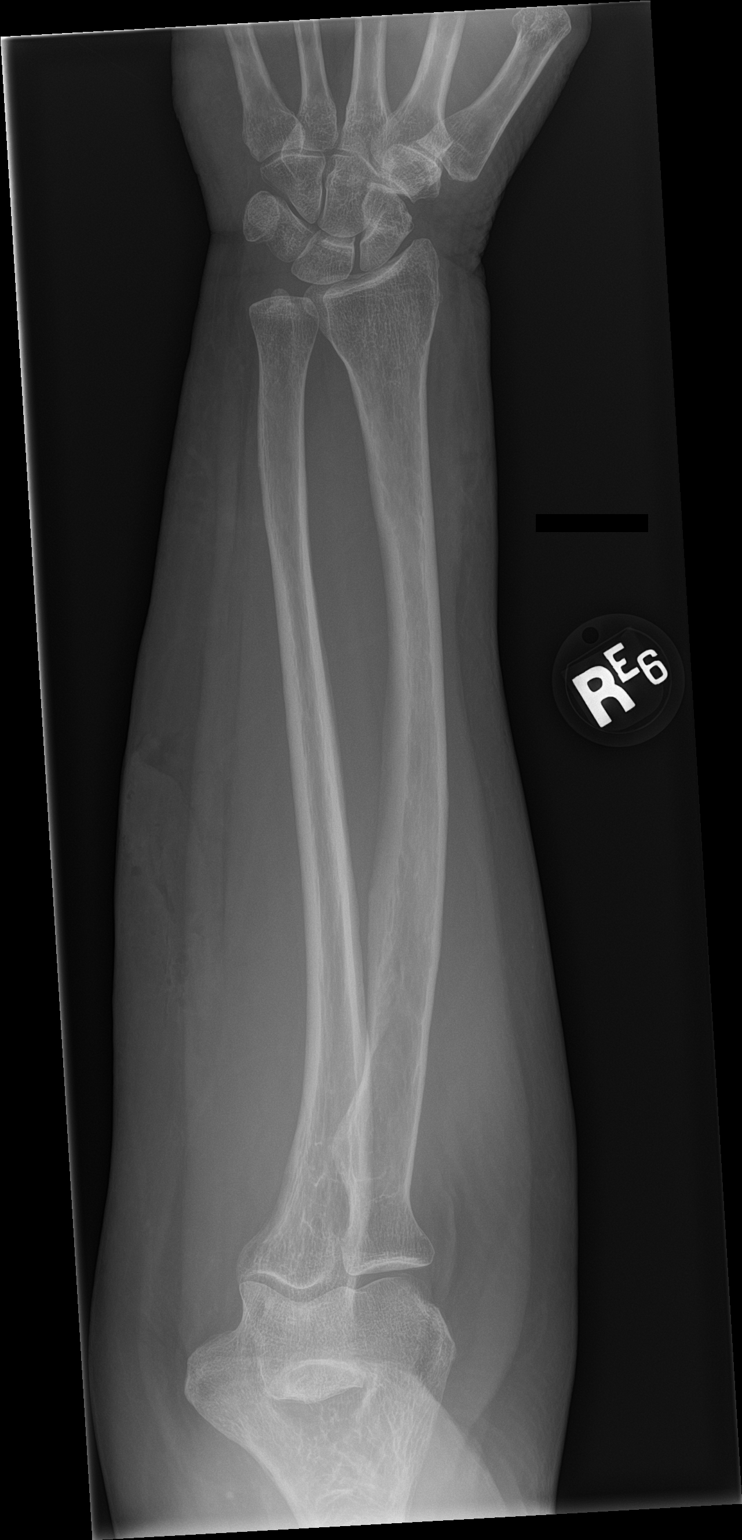

[forearm lat]
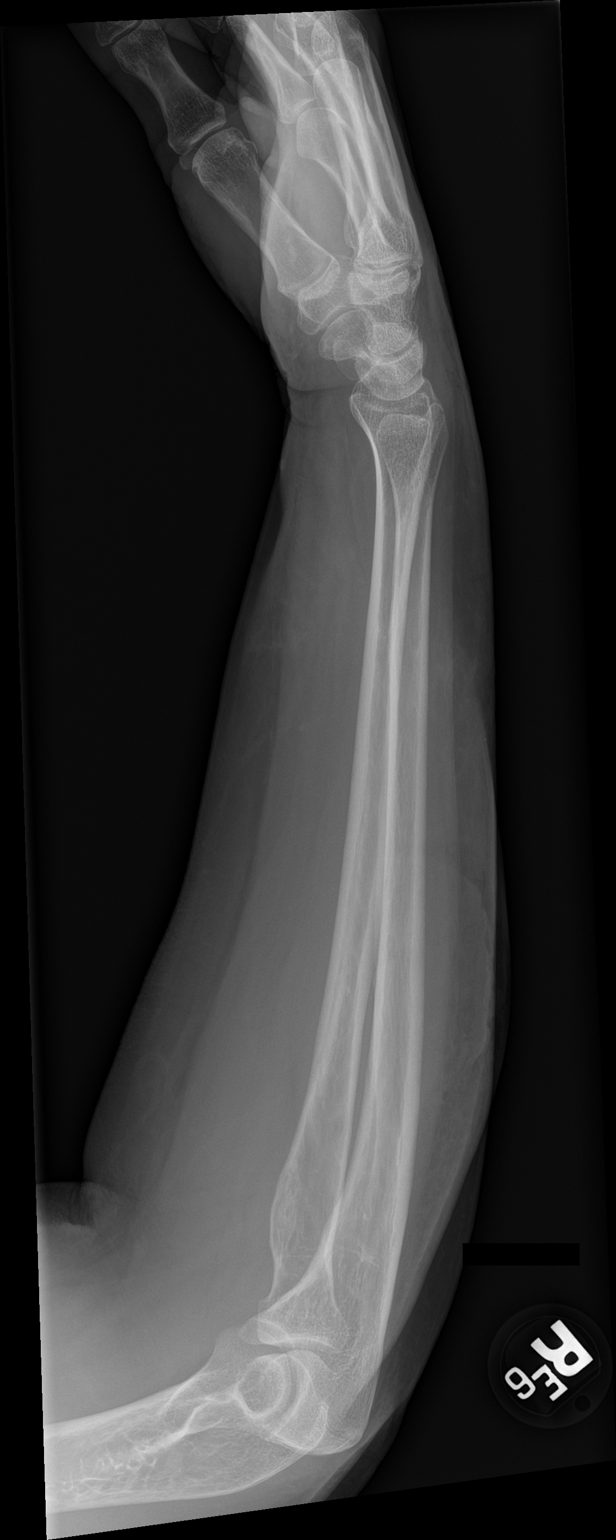

[2 of 2 positions shown; findings below may reference images not displayed]

FINDINGS: Negative for fracture or bone lesion

Gas in the soft tissues due to laceration.
IMPRESSION: No skeletal abnormality.  Laceration.

## 2020-12-27 MED ORDER — AMOXICILLIN-POT CLAVULANATE 875-125 MG PO TABS: 1.0000 | ORAL_TABLET | Freq: Two times a day (BID) | ORAL | 0 refills | Status: DC

## 2020-12-27 MED ORDER — LIDOCAINE-EPINEPHRINE (PF) 2 %-1:200000 IJ SOLN
20.0000 mL | Freq: Once | INTRAMUSCULAR | Status: AC
  Administered 2020-12-27: 20 mL via INTRADERMAL
  Filled 2020-12-27: qty 20

## 2020-12-27 MED ORDER — SODIUM CHLORIDE 0.9 % IV BOLUS
1000.0000 mL | Freq: Once | INTRAVENOUS | Status: AC
  Administered 2020-12-27: 1000 mL via INTRAVENOUS

## 2020-12-27 MED ORDER — KETOROLAC TROMETHAMINE 15 MG/ML IJ SOLN
15.0000 mg | Freq: Once | INTRAMUSCULAR | Status: AC
  Administered 2020-12-27: 15 mg via INTRAVENOUS
  Filled 2020-12-27: qty 1

## 2020-12-27 MED ORDER — TETANUS-DIPHTH-ACELL PERTUSSIS 5-2.5-18.5 LF-MCG/0.5 IM SUSY
0.5000 mL | PREFILLED_SYRINGE | Freq: Once | INTRAMUSCULAR | Status: AC
  Administered 2020-12-27: 0.5 mL via INTRAMUSCULAR
  Filled 2020-12-27: qty 0.5

## 2020-12-27 MED ORDER — OXYCODONE-ACETAMINOPHEN 5-325 MG PO TABS: 1.0000 | ORAL_TABLET | ORAL | 0 refills | Status: DC | PRN

## 2020-12-27 MED ORDER — FENTANYL CITRATE (PF) 100 MCG/2ML IJ SOLN
100.0000 ug | Freq: Once | INTRAMUSCULAR | Status: AC
  Administered 2020-12-27: 100 ug via INTRAVENOUS
  Filled 2020-12-27: qty 2

## 2020-12-27 MED ORDER — AMOXICILLIN-POT CLAVULANATE 875-125 MG PO TABS
1.0000 | ORAL_TABLET | Freq: Once | ORAL | Status: AC
  Administered 2020-12-27: 1 via ORAL
  Filled 2020-12-27: qty 1

## 2020-12-27 NOTE — Discharge Instructions (Addendum)
Watch for fever, redness around the wound, drainage from the wound that looks like an infection, new or worsening pain, swelling, or any other new/concerning symptoms.  If you develop these call your doctor or return to the ER.

## 2020-12-27 NOTE — ED Provider Notes (Signed)
[facility] EMERGENCY DEPARTMENT Provider Note   CSN: [id number] Arrival date & time: 12/27/20  0800     History Chief Complaint  Patient presents with   Animal Bite    Dana Hanson is a 60 y.o. female.  HPI 60 year old female presents with dog bites.  She was on a walk when a [place] came from around the house and bit her multiple times.  Would not seem to let go.  She did not fall to the ground.  Unsure of her last tetanus immunization.  The owner stated that the dogs shots are up-to-date and animal control has gotten involved.  The patient is having severe pain in her arm.  No weakness or numbness or decreased function.  EMS gave 4 mg of morphine.  Past Medical History:  Diagnosis Date   Gallstone pancreatitis    Hyperlipidemia    Right renal mass     Patient Active Problem List   Diagnosis Date Noted   Renal mass 09/03/2015   Renal mass, right    Pancreatitis 08/21/2015   Biliary obstruction    Primary osteoarthritis of right knee 03/17/2014   Hemorrhoids 08/01/2011   Heme positive stool 08/01/2011   Rectal bleed 08/01/2011    Past Surgical History:  Procedure Laterality Date   CESAREAN SECTION     x 2-tubal with last c-section   CHOLECYSTECTOMY N/A 09/03/2015   Procedure: LAPAROSCOPIC CHOLECYSTECTOMY WITH INTRAOPERATIVE CHOLANGIOGRAM;  Surgeon: Mickeal Skinner, MD;  Location: [facility];  Service: General;  Laterality: N/A;   COLONOSCOPY  08/14/2011   Procedure: COLONOSCOPY;  Surgeon: Daneil Dolin, MD;  [facility];  Service: Endoscopy;  Laterality: N/A;  10:00   HEMORRHOID SURGERY  11/10/2011   Procedure: HEMORRHOIDECTOMY;  Surgeon: Donato Heinz, MD;  Location: [facility];  Service: General;  Laterality: N/A;   [place] RESECTION  01-2015   LAPAROSCOPIC NEPHRECTOMY N/A 09/03/2015   Procedure: LAPAROSCOPIC RADICAL NEPHRECTOMY;  Surgeon: Ardis Hughs, MD;  Location: [facility];  Service: Urology;  Laterality: N/A;    TUBAL LIGATION     with last c-section     OB History   No obstetric history on file.     Family History  Problem Relation Age of Onset   Colon cancer Neg Hx    Liver disease Neg Hx    Anesthesia problems Neg Hx    Hypotension Neg Hx    Malignant hyperthermia Neg Hx    Pseudochol deficiency Neg Hx     Social History   Tobacco Use   Smoking status: Never   Smokeless tobacco: Never  Vaping Use   Vaping Use: Never used  Substance Use Topics   Alcohol use: No   Drug use: No    Home Medications Prior to Admission medications   Medication Sig Start Date End Date Taking? Authorizing Provider  amoxicillin-clavulanate (AUGMENTIN) 875-125 MG tablet Take 1 tablet by mouth 2 (two) times daily. 12/27/20  Yes Sherwood Gambler, MD  oxyCODONE-acetaminophen (PERCOCET) 5-325 MG tablet Take 1 tablet by mouth every 4 (four) hours as needed for severe pain. 12/27/20  Yes Sherwood Gambler, MD  calcium carbonate (OS-CAL - DOSED IN MG OF ELEMENTAL CALCIUM) 1250 (500 Ca) MG tablet Take 1 tablet by mouth daily with breakfast.    [provider]  Cyanocobalamin (VITAMIN B-12 PO) Take 1 tablet by mouth daily.    [provider]  ferrous sulfate 325 (65 FE) MG tablet Take 325 mg by mouth  daily with breakfast.    [provider]  Multiple Vitamin (MULITIVITAMIN WITH MINERALS) TABS Take 1 tablet by mouth daily.    [provider]  Multiple Vitamins-Minerals (HAIR/SKIN/NAILS) TABS Take 3 tablets by mouth daily.     [provider]    Allergies    Patient has no known allergies.  Review of Systems   Review of Systems  Musculoskeletal:  Positive for myalgias.  Skin:  Positive for wound.  Neurological:  Negative for weakness and numbness.   Physical Exam Updated Vital Signs BP (!) 100/59 (BP Location: Left Arm)   Pulse (!) 58   Temp 98.6 F (37 C) (Oral)   Resp 18   Ht 5\' 7"  (1.702 m)   Wt 78 kg   LMP 10/27/2013   SpO2 99%   BMI 26.94 kg/m    Physical Exam Vitals and nursing note reviewed.  Constitutional:      Appearance: She is well-developed.  HENT:     Head: Normocephalic and atraumatic.     Right Ear: External ear normal.     Left Ear: External ear normal.     Nose: Nose normal.  Eyes:     General:        Right eye: No discharge.        Left eye: No discharge.  Cardiovascular:     Rate and Rhythm: Normal rate and regular rhythm.     Pulses:          Radial pulses are 2+ on the right side.  Pulmonary:     Effort: Pulmonary effort is normal.  Abdominal:     General: There is no distension.  Musculoskeletal:     Comments: No joint swelling or specific bony tenderness in right upper arm, forearm or hand Normal ROM of elbow/wrist Normal sensation and radial/ulnar/median nerve testing in right hand 2 puncture wounds to right posterior upper arm, not currently bleeding curvilinear laceration to right forearm with fat exposed. Appears to be a loss of skin as well Small non-gaping laceration to right ulnar hand  Skin:    General: Skin is warm and dry.  Neurological:     Mental Status: She is alert.  Psychiatric:        Mood and Affect: Mood is not anxious.    ED Results / Procedures / Treatments   Labs (all labs ordered are listed, but only abnormal results are displayed) Labs Reviewed - No data to display  EKG None  Radiology DG Forearm Right  Result Date: 12/27/2020 CLINICAL DATA:  Dog bite EXAM: RIGHT FOREARM - 2 VIEW COMPARISON:  None. FINDINGS: Negative for fracture or bone lesion Gas in the soft tissues due to laceration. IMPRESSION: No skeletal abnormality.  Laceration. Electronically Signed   By: Franchot Gallo M.D.   On: 12/27/2020 09:02   DG Humerus Right  Result Date: 12/27/2020 CLINICAL DATA:  Dog bite. EXAM: RIGHT HUMERUS - 2+ VIEW COMPARISON:  None. FINDINGS: Negative for fracture or bone lesion Gas in the soft tissues from laceration. IMPRESSION: No skeletal abnormality.  Gas in the soft  tissues. Electronically Signed   By: Franchot Gallo M.D.   On: 12/27/2020 09:01   DG Hand Complete Right  Result Date: 12/27/2020 CLINICAL DATA:  Dog bite EXAM: RIGHT HAND - COMPLETE 3+ VIEW COMPARISON:  None. FINDINGS: There is no evidence of fracture or dislocation. There is no evidence of arthropathy or other focal bone abnormality. Soft tissues are unremarkable. IMPRESSION: Negative. Electronically Signed  By: Franchot Gallo M.D.   On: 12/27/2020 09:02    Procedures .Marland KitchenLaceration Repair  Date/Time: 12/27/2020 10:30 AM Performed by: Sherwood Gambler, MD Authorized by: Sherwood Gambler, MD   Consent:    Consent obtained:  Verbal   Consent given by:  Patient   Risks, benefits, and alternatives were discussed: yes   Universal protocol:    Patient identity confirmed:  Verbally with patient Anesthesia:    Anesthesia method:  Local infiltration   Local anesthetic:  Lidocaine 2% WITH epi Laceration details:    Location:  Shoulder/arm   Shoulder/arm location:  R lower arm   Length (cm):  6 Pre-procedure details:    Preparation:  Patient was prepped and draped in usual sterile fashion and imaging obtained to evaluate for foreign bodies Exploration:    Imaging obtained: x-ray     Wound exploration: entire depth of wound visualized     Contaminated: yes (a small piece of grass and 1 dog hair were removed)   Treatment:    Area cleansed with:  Saline   Amount of cleaning:  Extensive   Irrigation solution:  Sterile saline   Irrigation volume:  Copioius   Irrigation method:  Syringe   Visualized foreign bodies/material removed: yes     Debridement:  Minimal Skin repair:    Repair method:  Sutures   Suture size:  4-0   Suture material:  Nylon   Suture technique:  Horizontal mattress and simple interrupted   Number of sutures:  6 Repair type:    Repair type:  Intermediate Post-procedure details:    Procedure completion:  Tolerated well, no immediate complications patient    Medications Ordered in ED Medications  sodium chloride 0.9 % bolus 1,000 mL (0 mLs Intravenous Stopped 12/27/20 1007)  fentaNYL (SUBLIMAZE) injection 100 mcg (100 mcg Intravenous Given 12/27/20 0826)  Tdap (BOOSTRIX) injection 0.5 mL (0.5 mLs Intramuscular Given 12/27/20 0828)  ketorolac (TORADOL) 15 MG/ML injection 15 mg (15 mg Intravenous Given 12/27/20 1006)  lidocaine-EPINEPHrine (XYLOCAINE W/EPI) 2 %-1:200000 (PF) injection 20 mL (20 mLs Intradermal Given 12/27/20 1007)  amoxicillin-clavulanate (AUGMENTIN) 875-125 MG per tablet 1 tablet (1 tablet Oral Given 12/27/20 1006)    ED Course  I have reviewed the triage vital signs and the nursing notes.  Pertinent labs & imaging results that were available during my care of the patient were reviewed by me and considered in my medical decision making (see chart for details).    MDM Rules/Calculators/A&P                          Patient has multiple wounds from the dog bite.  3 of them are puncture wounds that I think would be best to be left open after discussion with patient.  However the forearm wound is a skin flap with fat exposed.  There is some missing skin as well, which the patient acknowledges was definitely noted on scene by bystanders as well.  The flap piece of skin is also lacerated in the middle little.  We discussed that perhaps this middle piece of skin might not survive but tried to repair.  It was left partially open due to the lack of skin and this will also help hopefully to keep it draining.  It was irrigated extensively with sterile saline.  We will put on antibiotics.  We will give short course of pain medication for breakthrough pain but fortunately there are no fractures or obvious neurovascular compromise.  Will discharge home with return precautions.  Tetanus was updated today. Final Clinical Impression(s) / ED Diagnoses Final diagnoses:  Dog bite of multiple sites of right upper arm, initial encounter  Dog bite of right  forearm, initial encounter  Dog bite of right hand, initial encounter    Rx / DC Orders ED Discharge Orders          Ordered    amoxicillin-clavulanate (AUGMENTIN) 875-125 MG tablet  2 times daily        12/27/20 1034    oxyCODONE-acetaminophen (PERCOCET) 5-325 MG tablet  Every 4 hours PRN        12/27/20 1034             Sherwood Gambler, MD 12/27/20 1036

## 2020-12-27 NOTE — ED Triage Notes (Signed)
Pt to er, pt states that she was doing her daily walk and was walking by a hose and a german shepard came out and bit her, owner of the dog states that it has had it's shots.  Pt has dressing in place, number 20 in her L ac with NS infusing.  EMS states that they gave 4mg  of morphine.

## 2020-12-27 NOTE — ED Notes (Signed)
Dressing applied to right lower and upper arm.

## 2021-07-27 ENCOUNTER — Encounter: Payer: Self-pay | Admitting: *Deleted

## 2021-08-01 ENCOUNTER — Ambulatory Visit: Payer: 59 | Admitting: Diagnostic Neuroimaging

## 2021-08-01 ENCOUNTER — Encounter: Payer: Self-pay | Admitting: Diagnostic Neuroimaging

## 2021-08-01 VITALS — BP 124/74 | HR 76 | Ht 67.0 in | Wt 164.8 lb

## 2021-08-01 DIAGNOSIS — S4991XS Unspecified injury of right shoulder and upper arm, sequela: Secondary | ICD-10-CM | POA: Diagnosis not present

## 2021-08-01 DIAGNOSIS — R251 Tremor, unspecified: Secondary | ICD-10-CM

## 2021-08-01 NOTE — Patient Instructions (Signed)
RIGHT ARM INJURY / TREMORS (s/p dog bite injury in 12/27/20) - intermittent tremors related to decreased range of motion from prior injury; also due to muscular over-exertion and fatigue - recommend to continue exercises for strength, flexibility and to improve range of motion - continue pregabalin

## 2021-08-01 NOTE — Progress Notes (Signed)
[facility]  PATIENT: Dana Hanson DOB: Aug 03, 1960  REFERRING CLINICIAN: [name], MD HISTORY FROM: PATIENT  REASON FOR VISIT: NEW CONSULT    HISTORICAL  CHIEF COMPLAINT:  Chief Complaint  Patient presents with   Burning, shaking of right hand    Rm 6 New Pt  "right arm attacked by dog 12/27/20, area is very sensitive, hand tremors; Gabapentin made me very drowsy"     HISTORY OF PRESENT ILLNESS:   61 year old female here for evaluation of right upper extremity tremor, pain, sensitivity since dog bite injury on 12/27/2020.  Patient was attacked by [place] in her friend's neighborhood on 12/27/2020 with extensive lacerations and bite injuries to her right hand, forearm and upper arm.  She went to the emergency room for evaluation and had wound care treatments and suturing.  She had significant weakness and limited range of motion at her wrist and elbow which had gradual improved over time.  However patient continues to have sensitivity to touch in her upper arm injury region.  Also her right hand has an intermittent tremor when she overexerts it or flexes her wrist.  This only happens a few times a month.  She has been using pregabalin with good relief.   REVIEW OF SYSTEMS: Full 14 system review of systems performed and negative with exception of: as per HPI.  ALLERGIES: No Known Allergies  HOME MEDICATIONS: Outpatient Medications Prior to Visit  Medication Sig Dispense Refill   Cyanocobalamin (VITAMIN B-12 PO) Take 1 tablet by mouth daily.     Multiple Vitamin (MULITIVITAMIN WITH MINERALS) TABS Take 1 tablet by mouth daily.     pregabalin (LYRICA) 50 MG capsule Take 50 mg by mouth 3 (three) times daily.     calcium carbonate (OS-CAL - DOSED IN MG OF ELEMENTAL CALCIUM) 1250 (500 Ca) MG tablet Take 1 tablet by mouth daily with breakfast. (Patient not taking: Reported on 08/01/2021)     ferrous sulfate 325 (65 FE) MG tablet Take 325 mg by mouth daily  with breakfast. (Patient not taking: Reported on 08/01/2021)     Multiple Vitamins-Minerals (HAIR/SKIN/NAILS) TABS Take 3 tablets by mouth daily.  (Patient not taking: Reported on 08/01/2021)     amoxicillin-clavulanate (AUGMENTIN) 875-125 MG tablet Take 1 tablet by mouth 2 (two) times daily. 13 tablet 0   oxyCODONE-acetaminophen (PERCOCET) 5-325 MG tablet Take 1 tablet by mouth every 4 (four) hours as needed for severe pain. 10 tablet 0   No facility-administered medications prior to visit.    PAST MEDICAL HISTORY: Past Medical History:  Diagnosis Date   Gallstone pancreatitis    Hyperlipidemia    IBS (irritable bowel syndrome)    Right renal mass     PAST SURGICAL HISTORY: Past Surgical History:  Procedure Laterality Date   CESAREAN SECTION     x 2-tubal with last c-section   CHOLECYSTECTOMY N/A 09/03/2015   Procedure: LAPAROSCOPIC CHOLECYSTECTOMY WITH INTRAOPERATIVE CHOLANGIOGRAM;  Surgeon: Mickeal Skinner, MD;  Location: [facility];  Service: General;  Laterality: N/A;   COLONOSCOPY  08/14/2011   Procedure: COLONOSCOPY;  Surgeon: Daneil Dolin, MD;  Location: [facility];  Service: Endoscopy;  Laterality: N/A;  10:00   HEMORRHOID SURGERY  11/10/2011   Procedure: HEMORRHOIDECTOMY;  Surgeon: Donato Heinz, MD;  Location: [facility];  Service: General;  Laterality: N/A;   [place] RESECTION  01-2015   LAPAROSCOPIC NEPHRECTOMY N/A 09/03/2015   Procedure: LAPAROSCOPIC RADICAL NEPHRECTOMY;  Surgeon: Ardis Hughs, MD;  Location: [facility];  Service: Urology;  Laterality: N/A;   TUBAL LIGATION     with last c-section    FAMILY HISTORY: Family History  Adopted: Yes  Problem Relation Age of Onset   Colon cancer Neg Hx    Liver disease Neg Hx    Anesthesia problems Neg Hx    Hypotension Neg Hx    Malignant hyperthermia Neg Hx    Pseudochol deficiency Neg Hx     SOCIAL HISTORY: Social History   Socioeconomic History   Marital status: Divorced    Spouse  name: Not on file   Number of children: 2   Years of education: Not on file   Highest education level: Bachelor's degree (e.g., BA, AB, BS)  Occupational History    Employer: [id number]  Tobacco Use   Smoking status: Never   Smokeless tobacco: Never  Vaping Use   Vaping Use: Never used  Substance and Sexual Activity   Alcohol use: No   Drug use: No   Sexual activity: Yes    Birth control/protection: Surgical  Other Topics Concern   Not on file  Social History Narrative   Lives alone   Social Determinants of Health   Financial Resource Strain: Not on file  Food Insecurity: Not on file  Transportation Needs: Not on file  Physical Activity: Not on file  Stress: Not on file  Social Connections: Not on file  Intimate Partner Violence: Not on file     PHYSICAL EXAM  GENERAL EXAM/CONSTITUTIONAL: Vitals:  Vitals:   08/01/21 1026  BP: 124/74  Pulse: 76  Weight: 164 lb 12.8 oz (74.8 kg)  Height: 5\' 7"  (1.702 m)   Body mass index is 25.81 kg/m. Wt Readings from Last 3 Encounters:  08/01/21 164 lb 12.8 oz (74.8 kg)  12/27/20 172 lb (78 kg)  04/14/20 185 lb (83.9 kg)   Patient is in no distress; well developed, nourished and groomed; neck is supple  CARDIOVASCULAR: Examination of carotid arteries is normal; no carotid bruits Regular rate and rhythm, no murmurs Examination of peripheral vascular system by observation and palpation is normal  EYES: Ophthalmoscopic exam of optic discs and posterior segments is normal; no papilledema or hemorrhages No results found.  MUSCULOSKELETAL: Gait, strength, tone, movements noted in Neurologic exam below  NEUROLOGIC: MENTAL STATUS:  No flowsheet data found. awake, alert, oriented to person, place and time recent and remote memory intact normal attention and concentration language fluent, comprehension intact, naming intact fund of knowledge appropriate  CRANIAL NERVE:  2nd - no papilledema on fundoscopic exam 2nd, 3rd,  4th, 6th - pupils equal and reactive to light, visual fields full to confrontation, extraocular muscles intact, no nystagmus 5th - facial sensation symmetric 7th - facial strength symmetric 8th - hearing intact 9th - palate elevates symmetrically, uvula midline 11th - shoulder shrug symmetric 12th - tongue protrusion midline  MOTOR:  normal bulk and tone, full strength in the BUE, BLE  SENSORY:  normal and symmetric to light touch, temperature, vibration; EXCEPT DECR IN RIGHT ARM AT PRIOR SITES OF DOG BITE INJURIES  COORDINATION:  finger-nose-finger, fine finger movements normal  REFLEXES:  deep tendon reflexes 1+ and symmetric  GAIT/STATION:  narrow based gait    DIAGNOSTIC DATA (LABS, IMAGING, TESTING) - I reviewed patient records, labs, notes, testing and imaging myself where available.  Lab Results  Component Value Date   WBC 10.7 (H) 09/05/2015   HGB 12.4 09/05/2015   HCT 38.1 09/05/2015   MCV 87.2  09/05/2015   PLT 190 09/05/2015      Component Value Date/Time   NA 136 09/05/2015 0607   K 4.4 09/05/2015 0607   CL 102 09/05/2015 0607   CO2 25 09/05/2015 0607   GLUCOSE 105 (H) 09/05/2015 0607   BUN 11 09/05/2015 0607   CREATININE 0.93 09/05/2015 0607   CALCIUM 9.2 09/05/2015 0607   PROT 6.9 09/03/2015 0625   ALBUMIN 4.2 09/03/2015 0625   AST 17 09/03/2015 0625   ALT 14 09/03/2015 0625   ALKPHOS 50 09/03/2015 0625   BILITOT 1.1 09/03/2015 0625   GFRNONAA >60 09/05/2015 0607   GFRAA >60 09/05/2015 0607   No results found for: CHOL, HDL, LDLCALC, LDLDIRECT, TRIG, CHOLHDL No results found for: HGBA1C No results found for: VITAMINB12 No results found for: TSH    ASSESSMENT AND PLAN  61 y.o. year old female here with:   Dx:  1. Arm injury, right, sequela      PLAN:  RIGHT ARM INJURY / TREMORS (s/p dog bite injury in 12/27/20) - intermittent tremors related to decreased range of motion from prior injury; also due to muscular over-exertion and  fatigue - recommend to continue exercises for strength, flexibility and to improve range of motion - continue pregabalin  Return for return to PCP, pending if symptoms worsen or fail to improve.    Penni Bombard, MD 10/30/6061, 01:60 AM Certified in Neurology, Neurophysiology and Neuroimaging  [facility] [address] [place], [place] [address] [phone]

## 2021-08-07 ENCOUNTER — Other Ambulatory Visit: Payer: Self-pay

## 2021-08-07 ENCOUNTER — Ambulatory Visit
Admission: EM | Admit: 2021-08-07 | Discharge: 2021-08-07 | Disposition: A | Discharge status: Home / Self Care | Payer: 59 | Attending: Urgent Care | Admitting: Urgent Care

## 2021-08-07 ENCOUNTER — Encounter: Payer: Self-pay | Admitting: Emergency Medicine

## 2021-08-07 ENCOUNTER — Ambulatory Visit (INDEPENDENT_AMBULATORY_CARE_PROVIDER_SITE_OTHER): Payer: 59

## 2021-08-07 DIAGNOSIS — M5134 Other intervertebral disc degeneration, thoracic region: Secondary | ICD-10-CM

## 2021-08-07 DIAGNOSIS — M546 Pain in thoracic spine: Secondary | ICD-10-CM

## 2021-08-07 IMAGING — DX DG THORACIC SPINE 2V
2 series · 2 of 2 positions shown · non-contrast
Comparison: None.

CLINICAL DATA: Thoracic back pain for 1.5 weeks, no known injury

EXAM:
THORACIC SPINE 2 VIEWS

[thoracic spine ap]
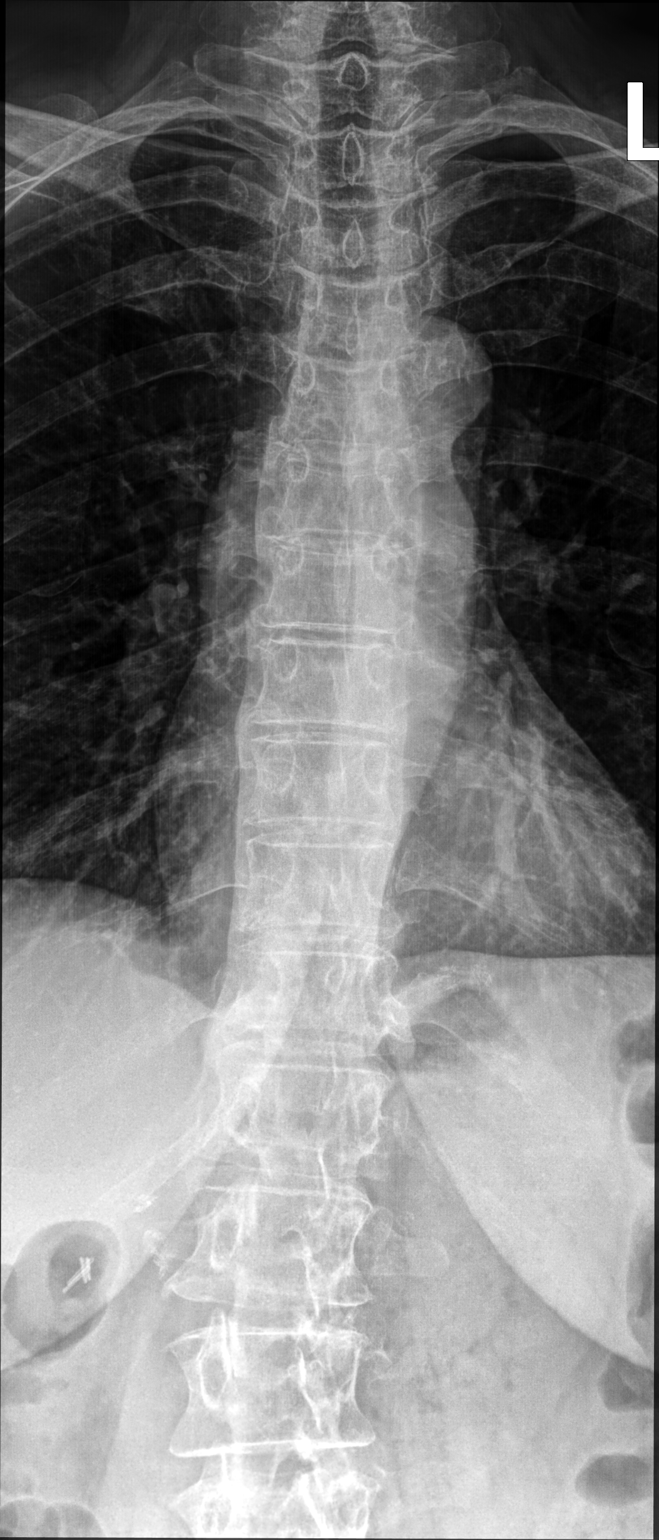

[thoracic spine standing lat]
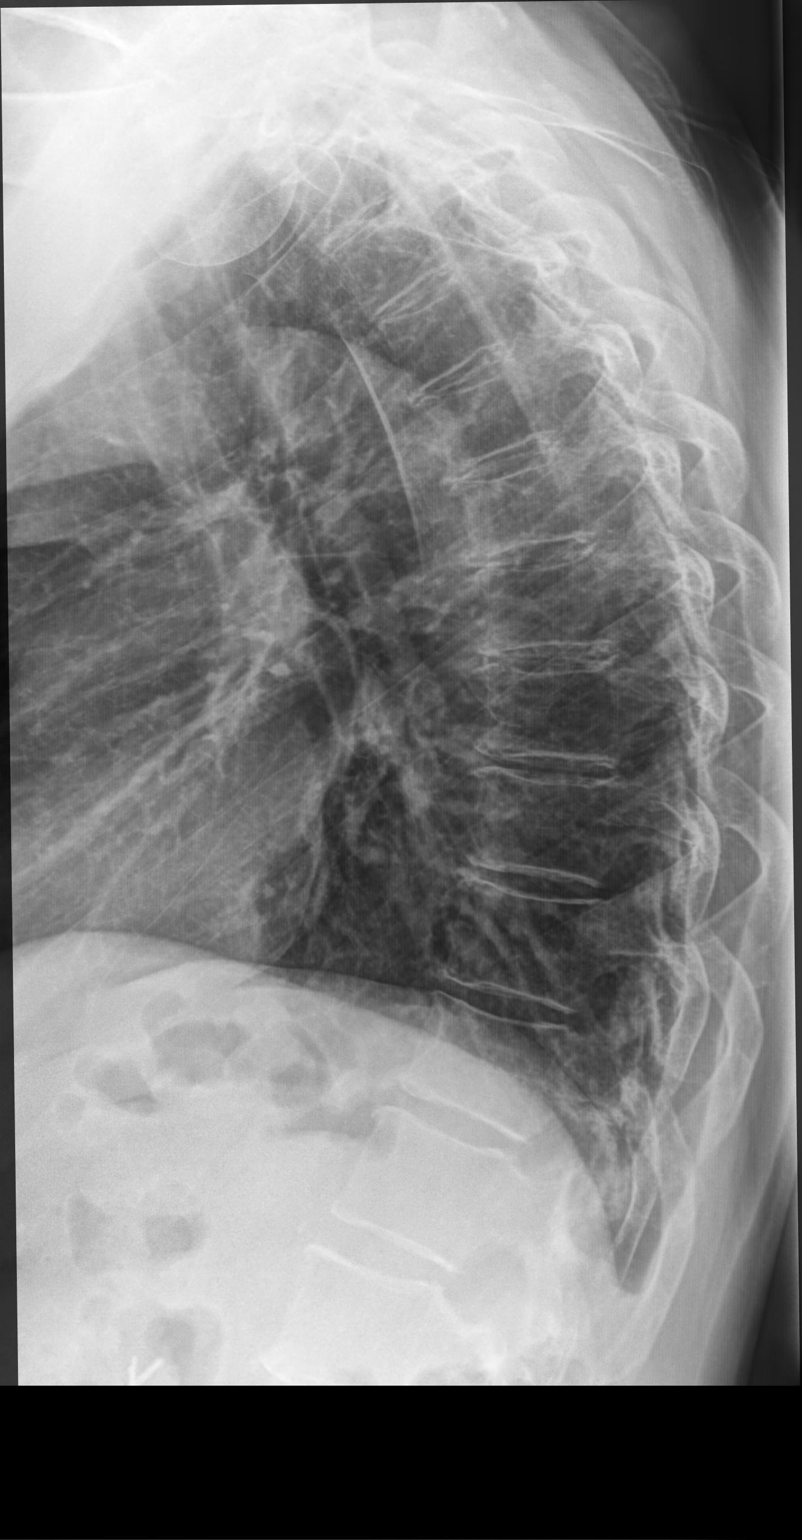

[2 of 2 positions shown; findings below may reference images not displayed]

FINDINGS: There is no evidence of thoracic spine fracture. Alignment is
normal. Mild to moderate multilevel disc space height loss and
osteophytosis throughout.
IMPRESSION: 1.  No acute fracture or dislocation of the thoracic spine.

2. Mild to moderate multilevel disc space height loss and
osteophytosis throughout.

## 2021-08-07 MED ORDER — ACETAMINOPHEN 325 MG PO TABS: 650.0000 mg | ORAL_TABLET | Freq: Four times a day (QID) | ORAL | 0 refills | Status: DC | PRN

## 2021-08-07 MED ORDER — TIZANIDINE HCL 4 MG PO TABS: 4.0000 mg | ORAL_TABLET | Freq: Every day | ORAL | 0 refills | Status: DC

## 2021-08-07 MED ORDER — PREDNISONE 50 MG PO TABS: 50.0000 mg | ORAL_TABLET | Freq: Every day | ORAL | 0 refills | Status: DC

## 2021-08-07 NOTE — ED Provider Notes (Signed)
[place]   MRN: [id number] DOB: 06-01-1961  Subjective:   Dana Hanson is a 61 y.o. female presenting for 1.5-week history of thoracic back pain over the midline and on either side.  No fall, trauma, weakness, numbness or tingling.  No history of musculoskeletal disorders.  The pain is there even when she lays down.  Denies cough, chest pain, shortness of breath.  She is not a smoker.  No unexplained fevers or weight loss.  No current facility-administered medications for this encounter.  Current Outpatient Medications:    calcium carbonate (OS-CAL - DOSED IN MG OF ELEMENTAL CALCIUM) 1250 (500 Ca) MG tablet, Take 1 tablet by mouth daily with breakfast. (Patient not taking: Reported on 08/01/2021), Disp: , Rfl:    Cyanocobalamin (VITAMIN B-12 PO), Take 1 tablet by mouth daily., Disp: , Rfl:    ferrous sulfate 325 (65 FE) MG tablet, Take 325 mg by mouth daily with breakfast. (Patient not taking: Reported on 08/01/2021), Disp: , Rfl:    Multiple Vitamin (MULITIVITAMIN WITH MINERALS) TABS, Take 1 tablet by mouth daily., Disp: , Rfl:    Multiple Vitamins-Minerals (HAIR/SKIN/NAILS) TABS, Take 3 tablets by mouth daily.  (Patient not taking: Reported on 08/01/2021), Disp: , Rfl:    pregabalin (LYRICA) 50 MG capsule, Take 50 mg by mouth 3 (three) times daily., Disp: , Rfl:    No Known Allergies  Past Medical History:  Diagnosis Date   Gallstone pancreatitis    Hyperlipidemia    IBS (irritable bowel syndrome)    Right renal mass      Past Surgical History:  Procedure Laterality Date   CESAREAN SECTION     x 2-tubal with last c-section   CHOLECYSTECTOMY N/A 09/03/2015   Procedure: LAPAROSCOPIC CHOLECYSTECTOMY WITH INTRAOPERATIVE CHOLANGIOGRAM;  Surgeon: Mickeal Skinner, MD;  Location: [facility];  Service: General;  Laterality: N/A;   COLONOSCOPY  08/14/2011   Procedure: COLONOSCOPY;  Surgeon: Daneil Dolin, MD;  [facility];  Service: Endoscopy;   Laterality: N/A;  10:00   HEMORRHOID SURGERY  11/10/2011   Procedure: HEMORRHOIDECTOMY;  Surgeon: Donato Heinz, MD;  Location: [facility];  Service: General;  Laterality: N/A;   [place] RESECTION  01-2015   LAPAROSCOPIC NEPHRECTOMY N/A 09/03/2015   Procedure: LAPAROSCOPIC RADICAL NEPHRECTOMY;  Surgeon: Ardis Hughs, MD;  Location: [facility];  Service: Urology;  Laterality: N/A;   TUBAL LIGATION     with last c-section    Family History  Adopted: Yes  Problem Relation Age of Onset   Colon cancer Neg Hx    Liver disease Neg Hx    Anesthesia problems Neg Hx    Hypotension Neg Hx    Malignant hyperthermia Neg Hx    Pseudochol deficiency Neg Hx     Social History   Tobacco Use   Smoking status: Never   Smokeless tobacco: Never  Vaping Use   Vaping Use: Never used  Substance Use Topics   Alcohol use: No   Drug use: No    ROS   Objective:   Vitals: BP (!) 123/57 (BP Location: Right Arm)    Pulse 64    Temp 98.4 F (36.9 C) (Oral)    Resp 16    LMP 10/27/2013    SpO2 97%   Physical Exam Constitutional:      General: She is not in acute distress.    Appearance: Normal appearance. She is well-developed. She is not ill-appearing, toxic-appearing or diaphoretic.  HENT:  Head: Normocephalic and atraumatic.     Nose: Nose normal.     Mouth/Throat:     Mouth: Mucous membranes are moist.  Eyes:     General: No scleral icterus.       Right eye: No discharge.        Left eye: No discharge.     Extraocular Movements: Extraocular movements intact.  Cardiovascular:     Rate and Rhythm: Normal rate.  Pulmonary:     Effort: Pulmonary effort is normal.  Musculoskeletal:     Thoracic back: No swelling, edema, deformity, signs of trauma, lacerations, spasms, tenderness or bony tenderness. Normal range of motion. No scoliosis.  Skin:    General: Skin is warm and dry.  Neurological:     General: No focal deficit present.     Mental Status: She is alert and  oriented to person, place, and time.     Motor: No weakness.     Coordination: Coordination normal.     Gait: Gait normal.  Psychiatric:        Mood and Affect: Mood normal.        Behavior: Behavior normal.        Thought Content: Thought content normal.        Judgment: Judgment normal.    DG Thoracic Spine 2 View  Result Date: 08/07/2021 CLINICAL DATA:  Thoracic back pain for 1.5 weeks, no known injury EXAM: THORACIC SPINE 2 VIEWS COMPARISON:  None. FINDINGS: There is no evidence of thoracic spine fracture. Alignment is normal. Mild to moderate multilevel disc space height loss and osteophytosis throughout. IMPRESSION: 1.  No acute fracture or dislocation of the thoracic spine. 2. Mild to moderate multilevel disc space height loss and osteophytosis throughout. Electronically Signed   By: Delanna Ahmadi M.D.   On: 08/07/2021 14:23     Assessment and Plan :   PDMP not reviewed this encounter.  1. Degenerative disc disease, thoracic   2. Acute midline thoracic back pain    No tenderness was elicited on exam but x-ray shows mild to moderate multilevel degenerative disc disease of the thoracic spine.  As such, will use an oral prednisone course.  Use Tylenol as well with tizanidine.  Follow-up with PCP. Counseled patient on potential for adverse effects with medications prescribed/recommended today, ER and return-to-clinic precautions discussed, patient verbalized understanding.    [name], PA-C 08/07/21 1431

## 2021-08-07 NOTE — ED Triage Notes (Signed)
Upper back pain x 1.5 weeks.  Denies any injury.

## 2021-08-20 ENCOUNTER — Emergency Department (HOSPITAL_COMMUNITY): Payer: 59

## 2021-08-20 ENCOUNTER — Other Ambulatory Visit: Payer: Self-pay

## 2021-08-20 ENCOUNTER — Emergency Department (HOSPITAL_COMMUNITY)
Admission: EM | Admit: 2021-08-20 | Discharge: 2021-08-20 | Disposition: A | Discharge status: Home / Self Care | Payer: 59 | Attending: Emergency Medicine | Admitting: Emergency Medicine

## 2021-08-20 ENCOUNTER — Encounter (HOSPITAL_COMMUNITY): Payer: Self-pay

## 2021-08-20 DIAGNOSIS — X58XXXA Exposure to other specified factors, initial encounter: Secondary | ICD-10-CM | POA: Diagnosis not present

## 2021-08-20 DIAGNOSIS — S22050A Wedge compression fracture of T5-T6 vertebra, initial encounter for closed fracture: Secondary | ICD-10-CM | POA: Diagnosis not present

## 2021-08-20 DIAGNOSIS — Z85528 Personal history of other malignant neoplasm of kidney: Secondary | ICD-10-CM | POA: Diagnosis not present

## 2021-08-20 DIAGNOSIS — S299XXA Unspecified injury of thorax, initial encounter: Secondary | ICD-10-CM | POA: Diagnosis present

## 2021-08-20 LAB — BASIC METABOLIC PANEL
Anion gap: 5 (ref 5–15)
BUN: 24 mg/dL — ABNORMAL HIGH (ref 6–20)
CO2: 28 mmol/L (ref 22–32)
Calcium: 9.3 mg/dL (ref 8.9–10.3)
Chloride: 106 mmol/L (ref 98–111)
Creatinine, Ser: 1.11 mg/dL — ABNORMAL HIGH (ref 0.44–1.00)
GFR, Estimated: 57 mL/min — ABNORMAL LOW (ref >60)
Glucose, Bld: 113 mg/dL — ABNORMAL HIGH (ref 70–99)
Potassium: 3.8 mmol/L (ref 3.5–5.1)
Sodium: 139 mmol/L (ref 135–145)

## 2021-08-20 LAB — CBC
HCT: 40.5 % (ref 36.0–46.0)
Hemoglobin: 12.9 g/dL (ref 12.0–15.0)
MCH: 28.7 pg (ref 26.0–34.0)
MCHC: 31.9 g/dL (ref 30.0–36.0)
MCV: 90 fL (ref 80.0–100.0)
Platelets: 250 10*3/uL (ref 150–400)
RBC: 4.5 MIL/uL (ref 3.87–5.11)
RDW: 14.2 % (ref 11.5–15.5)
WBC: 5.2 10*3/uL (ref 4.0–10.5)
nRBC: 0 % (ref 0.0–0.2)

## 2021-08-20 IMAGING — CT CT ANGIO CHEST
2 of 6 series · 18 of 46 positions shown · IV contrast (Omnipaque or Isovue)
Comparison: Chest radiograph [DATE]

CLINICAL DATA: Pulmonary embolus suspected with high probability.
Thoracic pain with breathing. History of renal cell cancer. Back
pain for 2 weeks.

EXAM:
CT ANGIOGRAPHY CHEST WITH CONTRAST
TECHNIQUE: Multidetector CT imaging of the chest was performed using the
standard protocol during bolus administration of intravenous
contrast. Multiplanar CT image reconstructions and MIPs were
obtained to evaluate the vascular anatomy.

[Series 5: pe axial thins · axial · 0.67mm/px · z∈[+863,+1090]mm · 15 of 309 slices shown]
[im 13/309  lung]
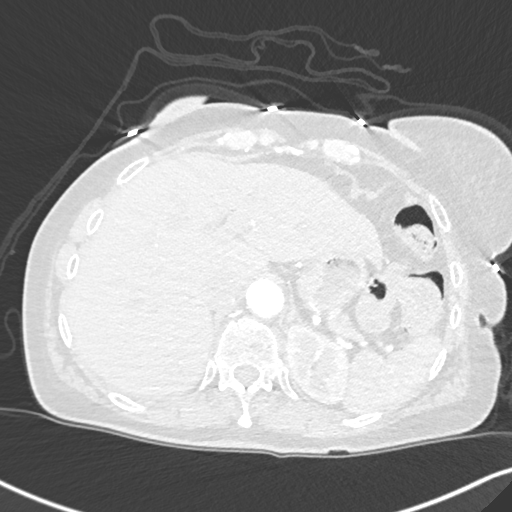
[im 39/309  soft-tissue]
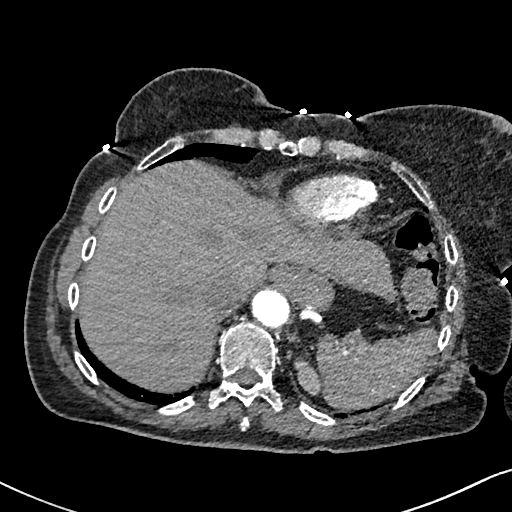
[im 52/309  lung]
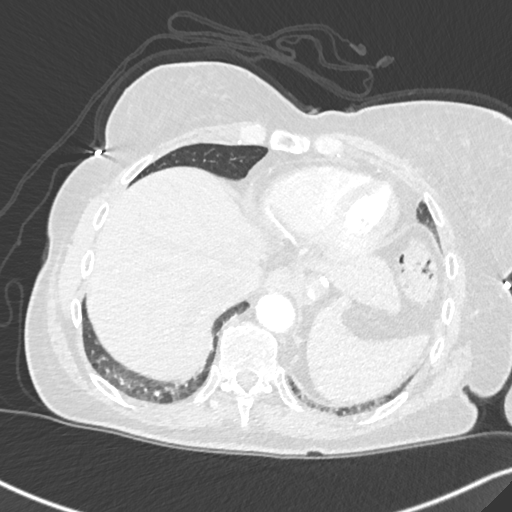
[im 78/309  soft-tissue]
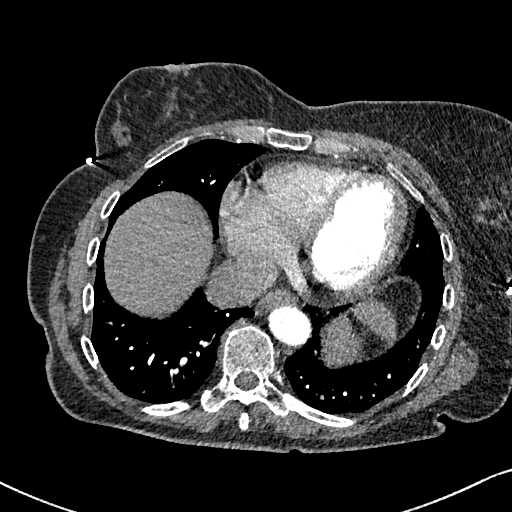
[im 90/309  lung]
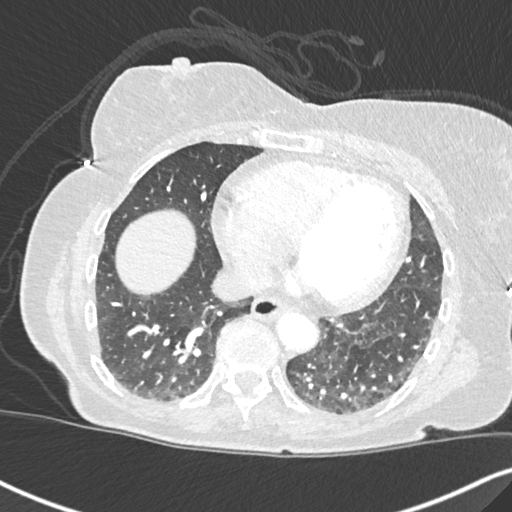
[im 116/309  soft-tissue]
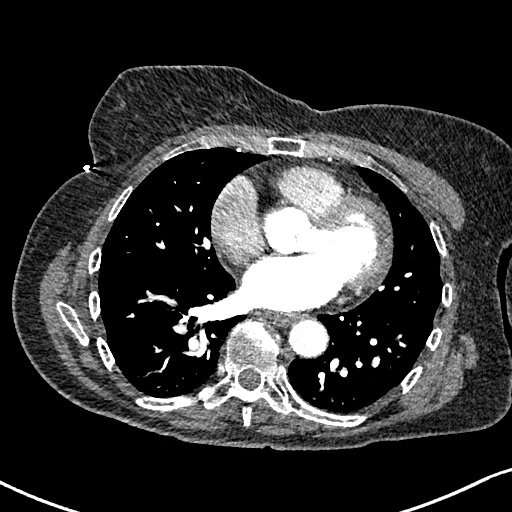
[im 129/309  lung]
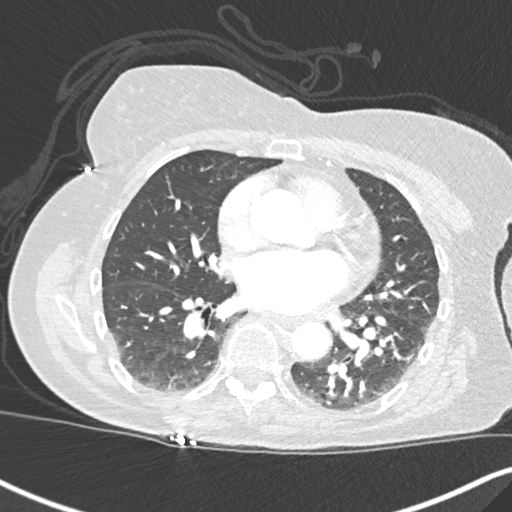
[im 155/309  soft-tissue]
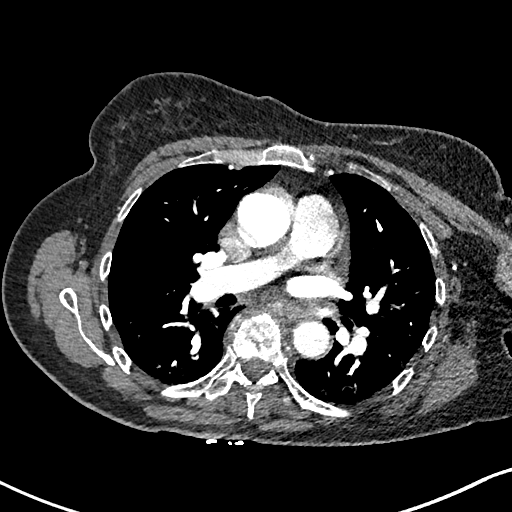
[im 180/309  lung]
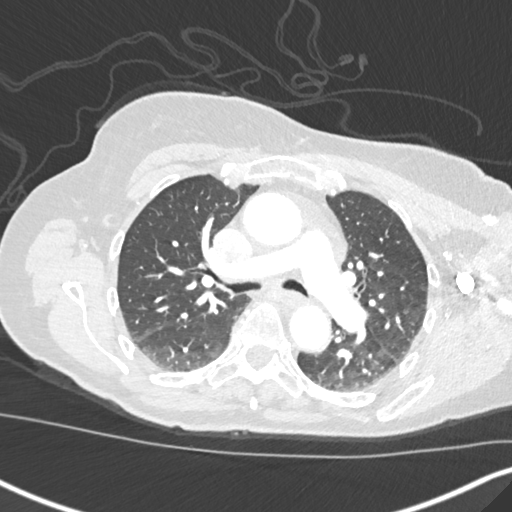
[im 193/309  soft-tissue]
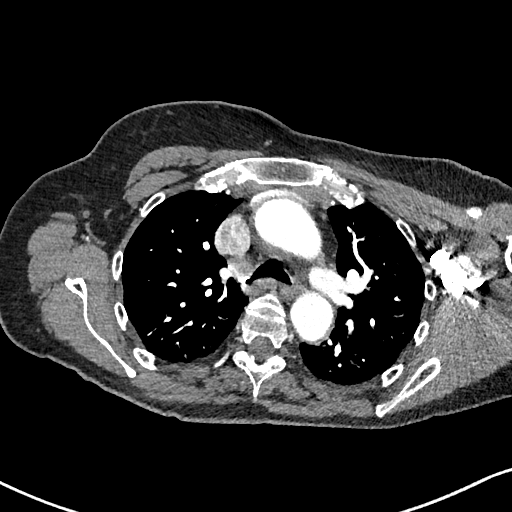
[im 219/309  lung]
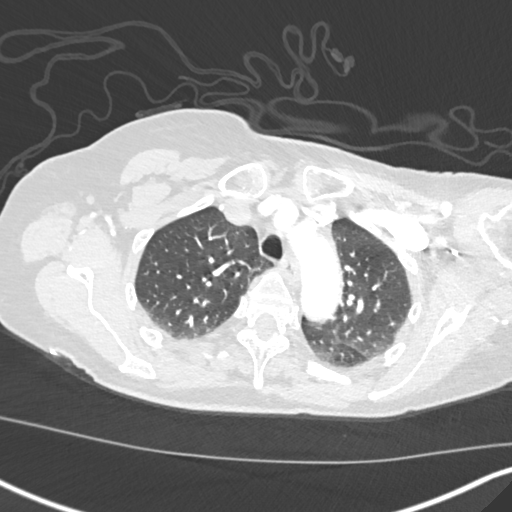
[im 232/309  soft-tissue]
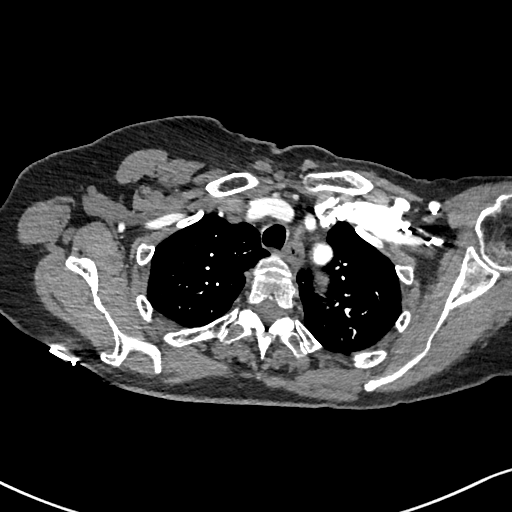
[im 257/309  lung]
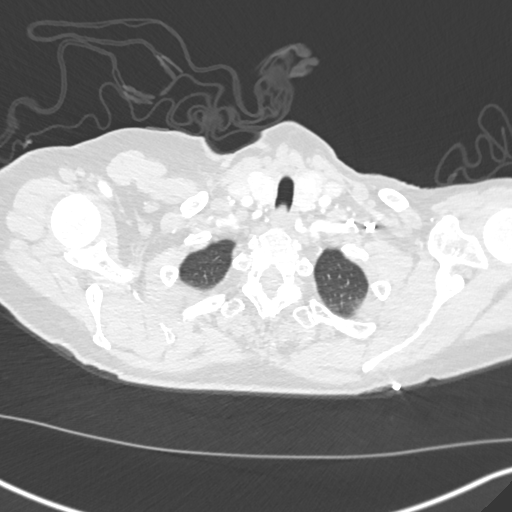
[im 270/309  soft-tissue]
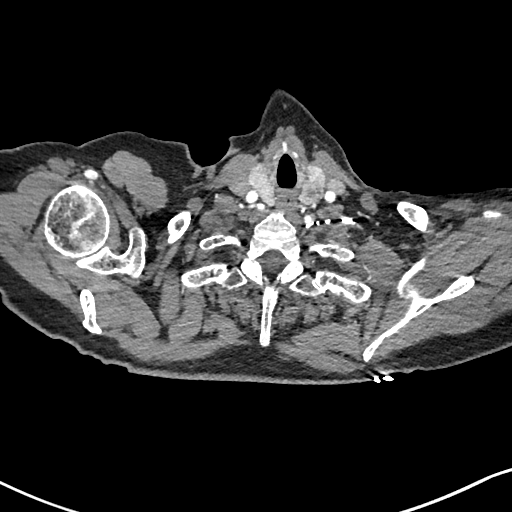
[im 296/309  lung]
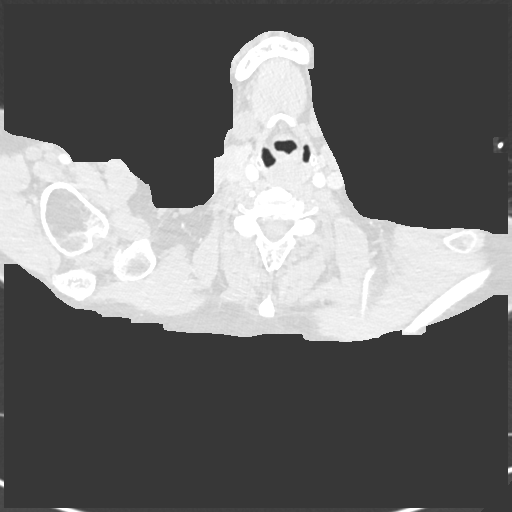

[Series 7: cor soft · coronal · 0.53mm/px · 3 of 142 slices shown]
[im 36/142  soft-tissue]
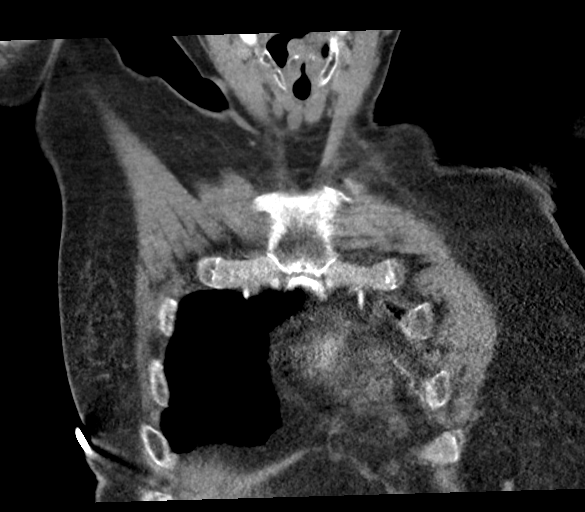
[im 71/142  soft-tissue]
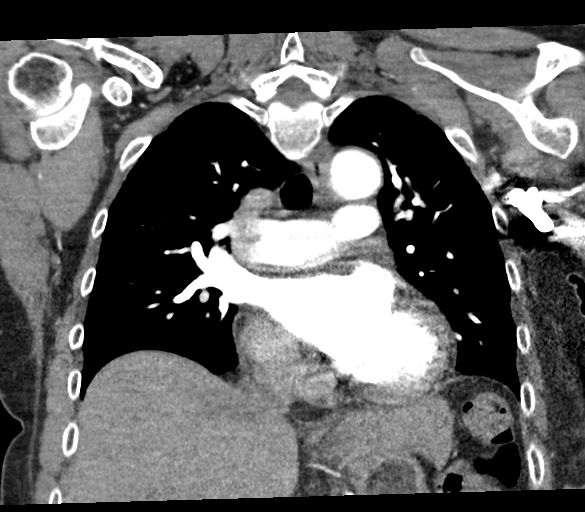
[im 106/142  soft-tissue]
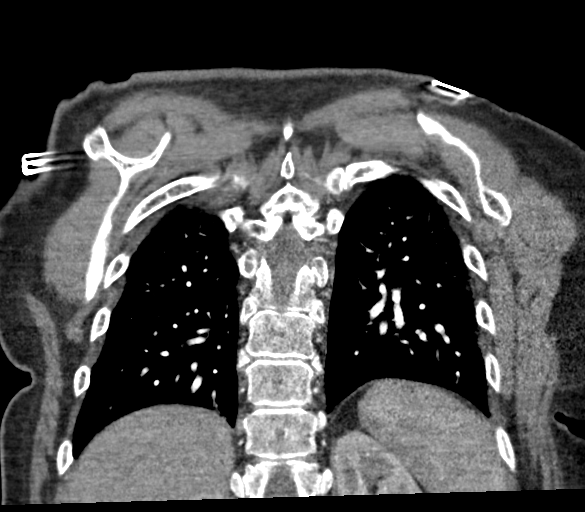

[18 of 46 positions shown; findings below may reference images not displayed]

RADIATION DOSE REDUCTION: This exam was performed according to the
departmental dose-optimization program which includes automated
exposure control, adjustment of the mA and/or kV according to
patient size and/or use of iterative reconstruction technique.

CONTRAST:  100mL OMNIPAQUE IOHEXOL 350 MG/ML SOLN
FINDINGS: Cardiovascular: Good opacification of the central and segmental
pulmonary arteries. No focal filling defects. No evidence of
significant pulmonary embolus. Normal heart size. No pericardial
effusions. Normal caliber thoracic aorta. No aortic dissection.
Great vessel origins are patent.

Mediastinum/Nodes: Subcentimeter thyroid nodules and calcifications.
By size criteria, no follow-up is indicated. Esophagus is
decompressed. No significant lymphadenopathy.

Lungs/Pleura: Mild dependent changes in the lungs. No airspace
disease or consolidation. No pleural effusions. No pneumothorax.
Airways are patent.

Upper Abdomen: No acute changes are identified in the visualized
upper abdomen.

Musculoskeletal: Degenerative changes in the spine. Heterogeneous
sclerosis and compression of a midthoracic vertebra, likely T6.
Appearance is suspicious for metastasis. No retropulsion of fracture
fragments.

Review of the MIP images confirms the above findings.
IMPRESSION: 1. No evidence of significant pulmonary embolus.
2. Lungs are clear.
3. Acute appearing compression fracture of the T6 vertebra with
sclerosis, suspicious for pathologic fracture.

## 2021-08-20 MED ORDER — KETOROLAC TROMETHAMINE 30 MG/ML IJ SOLN
30.0000 mg | Freq: Once | INTRAMUSCULAR | Status: AC
  Administered 2021-08-20: 30 mg via INTRAVENOUS
  Filled 2021-08-20: qty 1

## 2021-08-20 MED ORDER — HYDROCODONE-ACETAMINOPHEN 5-325 MG PO TABS: 1.0000 | ORAL_TABLET | Freq: Four times a day (QID) | ORAL | 0 refills | Status: AC | PRN

## 2021-08-20 MED ORDER — METHOCARBAMOL 500 MG PO TABS
1000.0000 mg | ORAL_TABLET | Freq: Once | ORAL | Status: AC
  Administered 2021-08-20: 1000 mg via ORAL
  Filled 2021-08-20: qty 2

## 2021-08-20 MED ORDER — IOHEXOL 350 MG/ML SOLN
100.0000 mL | Freq: Once | INTRAVENOUS | Status: AC | PRN
  Administered 2021-08-20: 100 mL via INTRAVENOUS

## 2021-08-20 MED ORDER — SODIUM CHLORIDE 0.9 % IV SOLN: INTRAVENOUS | Status: DC

## 2021-08-20 NOTE — ED Triage Notes (Addendum)
Rcems from home. Ems was called out to church for back pain. Pt has been having back pain for 2 weeks but while she was at church it got worse.    Dr Aline Brochure goes to her church and was there. He encouraged her to come here. Has an apt on Tuesday

## 2021-08-20 NOTE — ED Provider Notes (Signed)
[facility] EMERGENCY DEPARTMENT Provider Note   CSN: [id number] Arrival date & time: 08/20/21  1939     History  Chief Complaint  Patient presents with   Back Pain    Dana Hanson is a 61 y.o. female.   Back Pain  This patient is a very pleasant 61 year old female who presents to the hospital tonight with a complaint of back pain, this is present in her right thoracic area near her bra line, she states that this been present for a couple of weeks, she was actually seen at the urgent care within the last couple of weeks and prescribed Zanaflex and prednisone, these do not seem to be helping her that much.  She states that it is worse with taking a deep breath, she denies any other risk factors for pulmonary embolism other than a history of renal cell carcinoma which was removed surgically on her right kidney, it had not metastasized and she has not required further treatment.  She was at church this evening with Dr. Aline Brochure of the orthopedic service who recommended that she come to the hospital for an evaluation tonight.  She does have an appointment on Tuesday at that office.  She denies any numbness or weakness of the arms of the legs, no fevers, no urinary symptoms, no coughing, no rashes around the area where it hurts.  It is significantly worse with any types of movement, when she is holding still sometimes the pain will go away.  Home Medications Prior to Admission medications   Medication Sig Start Date End Date Taking? Authorizing Provider  HYDROcodone-acetaminophen (NORCO/VICODIN) 5-325 MG tablet Take 1-2 tablets by mouth every 6 (six) hours as needed for up to 7 days. 08/20/21 08/27/21 Yes Noemi Chapel, MD  acetaminophen (TYLENOL) 325 MG tablet Take 2 tablets (650 mg total) by mouth every 6 (six) hours as needed for moderate pain. 08/07/21   [name], PA-C  calcium carbonate (OS-CAL - DOSED IN MG OF ELEMENTAL CALCIUM) 1250 (500 Ca) MG tablet Take 1 tablet by mouth daily  with breakfast. Patient not taking: Reported on 08/01/2021    [provider]  Cyanocobalamin (VITAMIN B-12 PO) Take 1 tablet by mouth daily.    [provider]  ferrous sulfate 325 (65 FE) MG tablet Take 325 mg by mouth daily with breakfast. Patient not taking: Reported on 08/01/2021    [provider]  Multiple Vitamin (MULITIVITAMIN WITH MINERALS) TABS Take 1 tablet by mouth daily.    [provider]  Multiple Vitamins-Minerals (HAIR/SKIN/NAILS) TABS Take 3 tablets by mouth daily.  Patient not taking: Reported on 08/01/2021    [provider]  predniSONE (DELTASONE) 50 MG tablet Take 1 tablet (50 mg total) by mouth daily with breakfast. 08/07/21   [name], PA-C  pregabalin (LYRICA) 50 MG capsule Take 50 mg by mouth 3 (three) times daily. 07/22/21   [provider]  tiZANidine (ZANAFLEX) 4 MG tablet Take 1 tablet (4 mg total) by mouth at bedtime. 08/07/21   [name], PA-C      Allergies    Patient has no known allergies.    Review of Systems   Review of Systems  Musculoskeletal:  Positive for back pain.  All other systems reviewed and are negative.  Physical Exam Updated Vital Signs BP 121/66    Pulse 65    Resp 18    Ht 1.702 m (5\' 7" )    Wt 74.8 kg    LMP 10/27/2013  SpO2 98%    BMI 25.81 kg/m  Physical Exam Vitals and nursing note reviewed.  Constitutional:      General: She is not in acute distress.    Appearance: She is well-developed.  HENT:     Head: Normocephalic and atraumatic.     Mouth/Throat:     Pharynx: No oropharyngeal exudate.  Eyes:     General: No scleral icterus.       Right eye: No discharge.        Left eye: No discharge.     Conjunctiva/sclera: Conjunctivae normal.     Pupils: Pupils are equal, round, and reactive to light.  Neck:     Thyroid: No thyromegaly.     Vascular: No JVD.  Cardiovascular:     Rate and Rhythm: Normal rate and regular rhythm.     Heart sounds: Normal heart sounds. No  murmur heard.   No friction rub. No gallop.  Pulmonary:     Effort: Pulmonary effort is normal. No respiratory distress.     Breath sounds: Normal breath sounds. No wheezing or rales.  Abdominal:     General: Bowel sounds are normal. There is no distension.     Palpations: Abdomen is soft. There is no mass.     Tenderness: There is no abdominal tenderness.  Musculoskeletal:        General: Tenderness present. Normal range of motion.     Cervical back: Normal range of motion and neck supple.     Comments: There is tenderness to palpation in the right paraspinal muscles of the thoracic area into the rhomboid, no tenderness in the lower back or the cervical area, no tenderness in the trapezius  Lymphadenopathy:     Cervical: No cervical adenopathy.  Skin:    General: Skin is warm and dry.     Findings: No erythema or rash.  Neurological:     Mental Status: She is alert.     Coordination: Coordination normal.     Comments: Normal speech, normal strength in all 4 extremities, no facial droop  Psychiatric:        Behavior: Behavior normal.    ED Results / Procedures / Treatments   Labs (all labs ordered are listed, but only abnormal results are displayed) Labs Reviewed  BASIC METABOLIC PANEL - Abnormal; Notable for the following components:      Result Value   Glucose, Bld 113 (*)    BUN 24 (*)    Creatinine, Ser 1.11 (*)    GFR, Estimated 57 (*)    All other components within normal limits  CBC    EKG None  Radiology CT Angio Chest PE W and/or Wo Contrast  Result Date: 08/20/2021 CLINICAL DATA:  Pulmonary embolus suspected with high probability. Thoracic pain with breathing. History of renal cell cancer. Back pain for 2 weeks. EXAM: CT ANGIOGRAPHY CHEST WITH CONTRAST TECHNIQUE: Multidetector CT imaging of the chest was performed using the standard protocol during bolus administration of intravenous contrast. Multiplanar CT image reconstructions and MIPs were obtained to  evaluate the vascular anatomy. RADIATION DOSE REDUCTION: This exam was performed according to the departmental dose-optimization program which includes automated exposure control, adjustment of the mA and/or kV according to patient size and/or use of iterative reconstruction technique. CONTRAST:  117mL OMNIPAQUE IOHEXOL 350 MG/ML SOLN COMPARISON:  Chest radiograph 08/26/2019 FINDINGS: Cardiovascular: Good opacification of the central and segmental pulmonary arteries. No focal filling defects. No evidence of significant pulmonary embolus. Normal heart size. No  pericardial effusions. Normal caliber thoracic aorta. No aortic dissection. Great vessel origins are patent. Mediastinum/Nodes: Subcentimeter thyroid nodules and calcifications. By size criteria, no follow-up is indicated. Esophagus is decompressed. No significant lymphadenopathy. Lungs/Pleura: Mild dependent changes in the lungs. No airspace disease or consolidation. No pleural effusions. No pneumothorax. Airways are patent. Upper Abdomen: No acute changes are identified in the visualized upper abdomen. Musculoskeletal: Degenerative changes in the spine. Heterogeneous sclerosis and compression of a midthoracic vertebra, likely T6. Appearance is suspicious for metastasis. No retropulsion of fracture fragments. Review of the MIP images confirms the above findings. IMPRESSION: 1. No evidence of significant pulmonary embolus. 2. Lungs are clear. 3. Acute appearing compression fracture of the T6 vertebra with sclerosis, suspicious for pathologic fracture. Electronically Signed   By: Lucienne Capers M.D.   On: 08/20/2021 22:09    Procedures Procedures    Medications Ordered in ED Medications  0.9 %  sodium chloride infusion ( Intravenous New Bag/Given 08/20/21 2031)  ketorolac (TORADOL) 30 MG/ML injection 30 mg (30 mg Intravenous Given 08/20/21 2026)  methocarbamol (ROBAXIN) tablet 1,000 mg (1,000 mg Oral Given 08/20/21 2036)  iohexol (OMNIPAQUE) 350 MG/ML  injection 100 mL (100 mLs Intravenous Contrast Given 08/20/21 2143)    ED Course/ Medical Decision Making/ A&P                           Medical Decision Making Amount and/or Complexity of Data Reviewed Labs: ordered. Radiology: ordered.  Risk Prescription drug management.   This patient presents to the ED for concern of right upper back pain, this involves an extensive number of treatment options, and is a complaint that carries with it a high risk of complications and morbidity.  The differential diagnosis includes muscular strain, pleurisy, radiculopathy, this does not seem consistent with spinal stenosis or cauda equina, there is no rash to suggest that this is zoster   Co morbidities that complicate the patient evaluation  2 weeks of back pain   Additional history obtained:  Additional history obtained from medical record External records from outside source obtained and reviewed including imaging did not show anything in her spine from urgent care   Lab Tests:  I Ordered, and personally interpreted labs.  The pertinent results include: CBC and metabolic panel in preparation of CT scan , given that the patient only has 1 kidney and we will check her renal function first, renal function unremarkable, CBC unremarkable   Imaging Studies ordered:  I ordered imaging studies including PE protocol CT I independently visualized and interpreted imaging which showed T6 compression fracture I agree with the radiologist interpretation   Cardiac Monitoring:  The patient was maintained on a cardiac monitor.  I personally viewed and interpreted the cardiac monitored which showed an underlying rhythm of: Normal sinus rhythm   Medicines ordered and prescription drug management:  I ordered medication including Toradol, robaxin for muscle spasm and pain  Reevaluation of the patient after these medicines showed that the patient stayed the same I have reviewed the patients home  medicines and have made adjustments as needed   Test Considered:  MRI of the back but this is not available and the patient is neurologically intact, she can follow-up outpatient to have this further evaluated and investigated   Critical Interventions:  Evaluation with CT scan to further elucidate the cause of the patient's pain which eventually was found to be a T6 compression fracture of her spine   Problem  List / ED Course:  The patient was treated with pain medicine, vital signs were normal, neurologic exam was normal, labs were normal, she was given instructions on close follow-up and was agreeable   Reevaluation:  After the interventions noted above, I reevaluated the patient and found that they have :improved   Social Determinants of Health:  None   Dispostion:  After consideration of the diagnostic results and the patients response to treatment, I feel that the patent would benefit from discharge home to follow-up with orthopedics, in basket message sent to Dr. Aline Brochure regarding results and follow-up.          Final Clinical Impression(s) / ED Diagnoses Final diagnoses:  Compression fracture of T6 vertebra, initial encounter [facility])    Rx / DC Orders ED Discharge Orders          Ordered    HYDROcodone-acetaminophen (NORCO/VICODIN) 5-325 MG tablet  Every 6 hours PRN        08/20/21 2312              Noemi Chapel, MD 08/20/21 2318

## 2021-08-20 NOTE — Discharge Instructions (Signed)
Your tests show that your T6 vertebrae is broken -  You will need to follow-up with your cancer doctor as well as with your family doctor, Dr. Gerarda Fraction and Dr. Aline Brochure.  I have sent Dr. Aline Brochure a note about the findings on the CT scan so that he can help get you to the right definitive treatment over time.  I have given you a prescription for hydrocodone with Tylenol, this is known as Vicodin, it can cause nausea and constipation.  Please take 1 or 2 tablets every 6 hours as needed for severe pain.  If you develop severe or worsening pain numbness or weakness of the legs or difficulty urinating return to the emergency department immediately.

## 2021-08-23 ENCOUNTER — Ambulatory Visit (INDEPENDENT_AMBULATORY_CARE_PROVIDER_SITE_OTHER): Payer: 59 | Admitting: Orthopedic Surgery

## 2021-08-23 ENCOUNTER — Other Ambulatory Visit: Payer: Self-pay

## 2021-08-23 ENCOUNTER — Encounter: Payer: Self-pay | Admitting: Orthopedic Surgery

## 2021-08-23 VITALS — BP 148/92 | HR 59 | Ht 67.0 in | Wt 166.0 lb

## 2021-08-23 DIAGNOSIS — S22050A Wedge compression fracture of T5-T6 vertebra, initial encounter for closed fracture: Secondary | ICD-10-CM | POA: Diagnosis not present

## 2021-08-23 NOTE — Patient Instructions (Signed)
Urgent referral to neurosurgery.  Please contact the clinic if there are delays in getting an appointment scheduled

## 2021-08-23 NOTE — Progress Notes (Signed)
New Patient Visit  Assessment: Dana Hanson is a 61 y.o. female with the following: 1. Closed wedge compression fracture of T6 vertebra, initial encounter [facility])  Plan: Patient sustained a T6 compression fracture.  Atraumatic, no falls or stumbles.  Fracture identified on CT scan.  There is concern for a pathologic fracture.  Patient has a history of renal cancer.  Recommend an urgent referral to neurosurgery for further evaluation.  Contact clinic with issues.    Follow-up: Return if symptoms worsen or fail to improve.  Subjective:  Chief Complaint  Patient presents with   Back Pain    Mid back pain. Pt seen in ER on 08/20/21 and a T6 compression fx was found. C/o pain at her bra line.     History of Present Illness: Dana Hanson is a 61 y.o. female who presents for evaluation of back pain.  Patient first started to have pain in her middle back, a couple weeks ago.  She denies any recent injury.  No falls or stumbles.  She just woke up and started to have some pain.  This progressively worsened.  She was seen in the ED, because she was having difficulty breathing.  CT scan to evaluate for a PE identified a compression fracture at T6.  No numbness or tingling.  Pain has remained severe.  Of note, patient has a history of renal cancer, status post nephrectomy.   Review of Systems: No fevers or chills No numbness or tingling No chest pain No shortness of breath No bowel or bladder dysfunction No GI distress No headaches   Medical History:  Past Medical History:  Diagnosis Date   Gallstone pancreatitis    Hyperlipidemia    IBS (irritable bowel syndrome)    Right renal mass     Past Surgical History:  Procedure Laterality Date   CESAREAN SECTION     x 2-tubal with last c-section   CHOLECYSTECTOMY N/A 09/03/2015   Procedure: LAPAROSCOPIC CHOLECYSTECTOMY WITH INTRAOPERATIVE CHOLANGIOGRAM;  Surgeon: Mickeal Skinner, MD;  Location: [facility] ORS;  Service: General;   Laterality: N/A;   COLONOSCOPY  08/14/2011   Procedure: COLONOSCOPY;  Surgeon: Daneil Dolin, MD;  Location: [facility] END[facility] SUITE;  Service: Endoscopy;  Laterality: N/A;  10:00   HEMORRHOID SURGERY  11/10/2011   Procedure: HEMORRHOIDECTOMY;  Surgeon: Donato Heinz, MD;  Location: [facility] ORS;  Service: General;  Laterality: N/A;   [place] RESECTION  01-2015   LAPAROSCOPIC NEPHRECTOMY N/A 09/03/2015   Procedure: LAPAROSCOPIC RADICAL NEPHRECTOMY;  Surgeon: Ardis Hughs, MD;  Location: [facility] ORS;  Service: Urology;  Laterality: N/A;   TUBAL LIGATION     with last c-section    Family History  Adopted: Yes  Problem Relation Age of Onset   Colon cancer Neg Hx    Liver disease Neg Hx    Anesthesia problems Neg Hx    Hypotension Neg Hx    Malignant hyperthermia Neg Hx    Pseudochol deficiency Neg Hx    Social History   Tobacco Use   Smoking status: Never   Smokeless tobacco: Never  Vaping Use   Vaping Use: Never used  Substance Use Topics   Alcohol use: No   Drug use: No    No Known Allergies  No outpatient medications have been marked as taking for the 08/23/21 encounter (Office Visit) with Mordecai Rasmussen, MD.    Objective: BP (!) 148/92    Pulse (!) 59    Ht 5\' 7"  (  1.702 m)    Wt 166 lb (75.3 kg)    LMP 10/27/2013    BMI 26.00 kg/m   Physical Exam:  General: Alert and oriented. and No acute distress. Gait: Normal gait.  Patient is sitting and ambulating with a stiff back.  Tenderness to palpation of the middle back area.  Strength in bilateral lower extremities is 5/5.  No evidence of trauma.  No atrophy.  No bruising in the lower back.  Sensation is intact throughout bilateral lower extremities, in all dermatomal distributions.  IMAGING: I personally reviewed images previously obtained from the ED  CT scan demonstrates a T6 compression fracture, with possible area of lucency.   New Medications:  No orders of the defined types were placed in this  encounter.     Mordecai Rasmussen, MD  08/24/2021 12:02 AM

## 2021-08-24 ENCOUNTER — Encounter: Payer: Self-pay | Admitting: Orthopedic Surgery

## 2021-08-30 ENCOUNTER — Other Ambulatory Visit (HOSPITAL_COMMUNITY): Payer: Self-pay | Admitting: Neurosurgery

## 2021-08-30 DIAGNOSIS — S22050A Wedge compression fracture of T5-T6 vertebra, initial encounter for closed fracture: Secondary | ICD-10-CM

## 2021-09-04 ENCOUNTER — Ambulatory Visit (HOSPITAL_COMMUNITY)
Admission: RE | Admit: 2021-09-04 | Discharge: 2021-09-04 | Disposition: A | Discharge status: Home / Self Care | Payer: 59 | Source: Ambulatory Visit | Attending: Neurosurgery | Admitting: Neurosurgery

## 2021-09-04 DIAGNOSIS — S22050A Wedge compression fracture of T5-T6 vertebra, initial encounter for closed fracture: Secondary | ICD-10-CM | POA: Diagnosis not present

## 2021-09-04 IMAGING — MR MR THORACIC SPINE WO/W CM
7 of 9 series · 32 of 48 positions shown · IV contrast (gadavist)
Comparison: CTA chest [DATE].

CLINICAL DATA: 60-year-old female with T6 compression fracture.
Renal cell carcinoma.

EXAM:
MRI THORACIC WITHOUT AND WITH CONTRAST
TECHNIQUE: Multiplanar and multiecho pulse sequences of the thoracic spine were
obtained without and with intravenous contrast.
CONTRAST:  7mL GADAVIST GADOBUTROL 1 MMOL/ML IV SOLN

[Series 16: T1 · sagittal · 4.0mm · 1.72mm/px · 1 of 5 slices shown (1 of 3)]
[im 1/5]
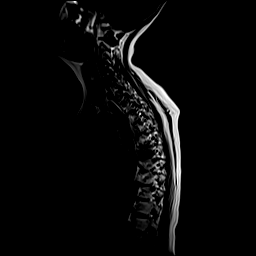

[Series 17: STIR · sagittal · 3.0mm · 1.00mm/px · 4 of 19 slices shown]
[im 1/19]
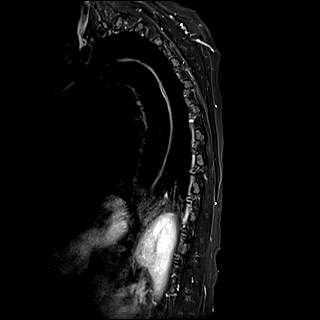
[im 7/19]
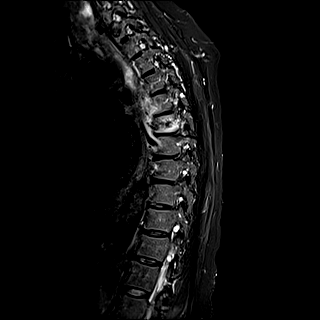
[im 13/19]
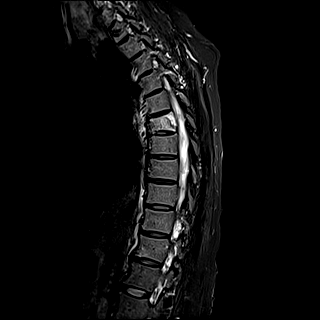
[im 19/19]
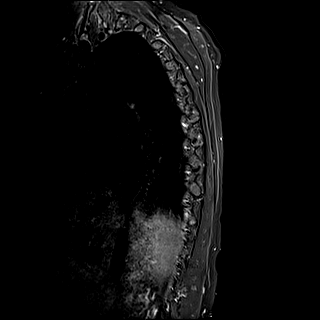

[Series 18: T1 · sagittal · 3.0mm · 1.00mm/px · 3 of 15 slices shown (2 of 3)]
[im 1/15]
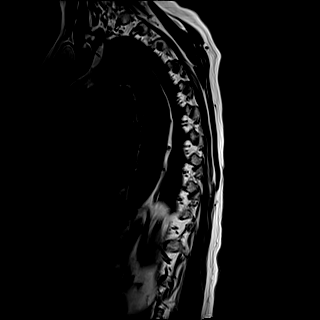
[im 8/15]
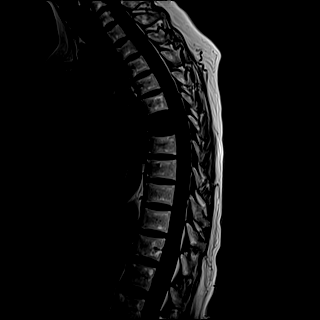
[im 15/15]
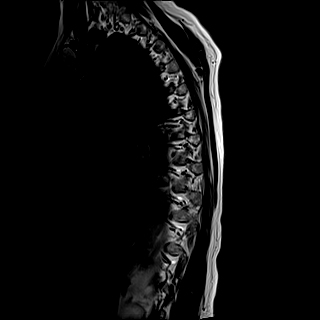

[Series 19: T2 · axial · 4.0mm · 0.78mm/px · z∈[-321,-121]mm · 8 of 44 slices shown]
[im 1/44]
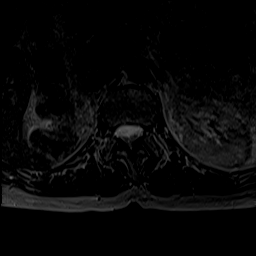
[im 7/44]
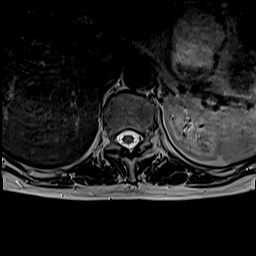
[im 13/44]
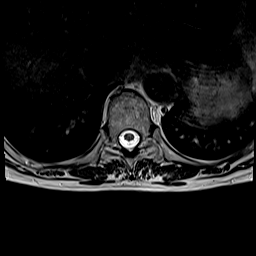
[im 19/44]
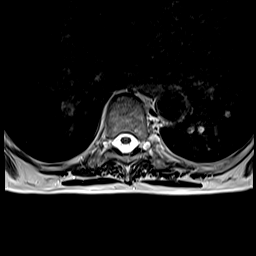
[im 25/44]
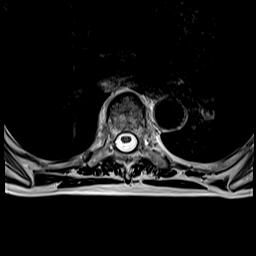
[im 31/44]
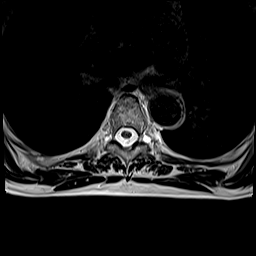
[im 37/44]
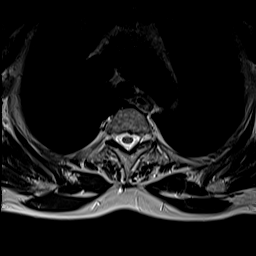
[im 44/44]
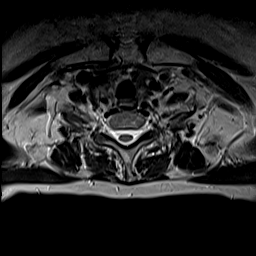

[Series 21: T1 · axial · 4.0mm · 0.39mm/px · z∈[-321,-121]mm · 8 of 44 slices shown (3 of 3)]
[im 1/44]
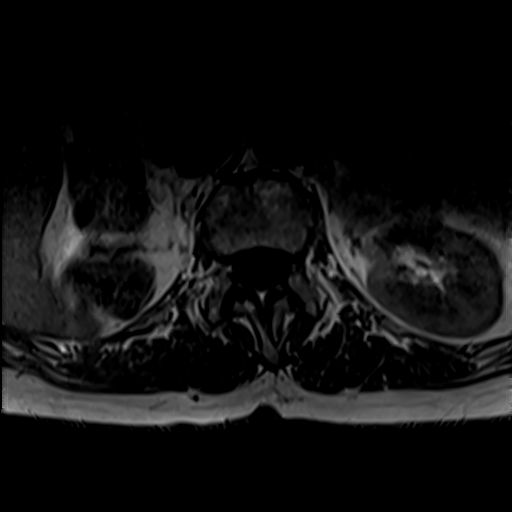
[im 7/44]
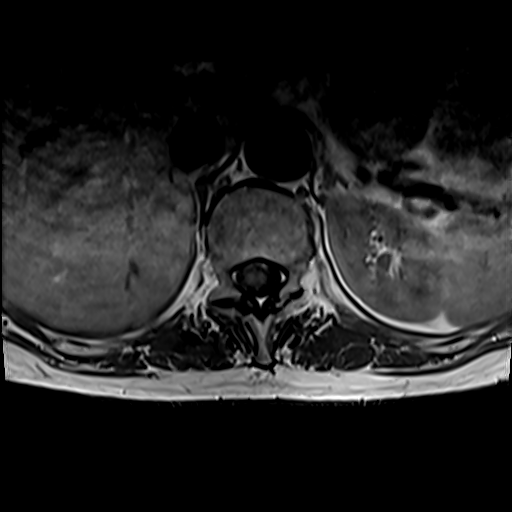
[im 13/44]
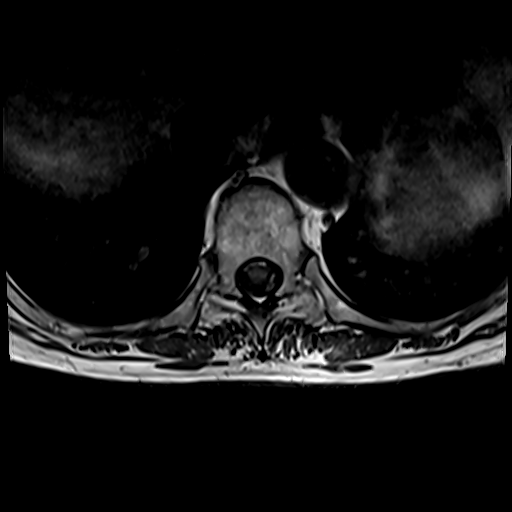
[im 19/44]
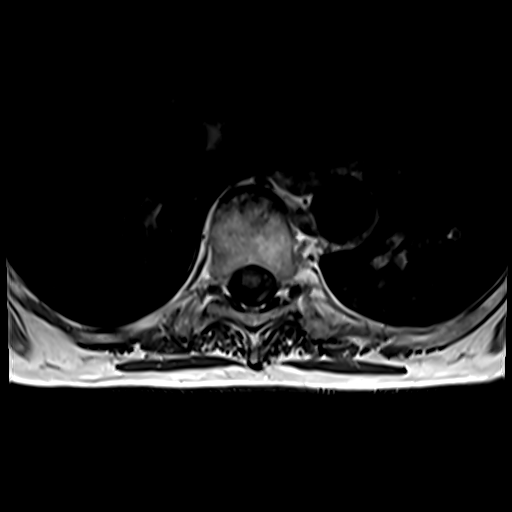
[im 25/44]
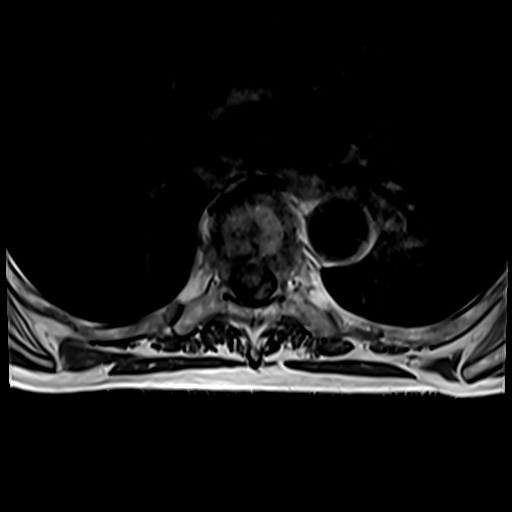
[im 31/44]
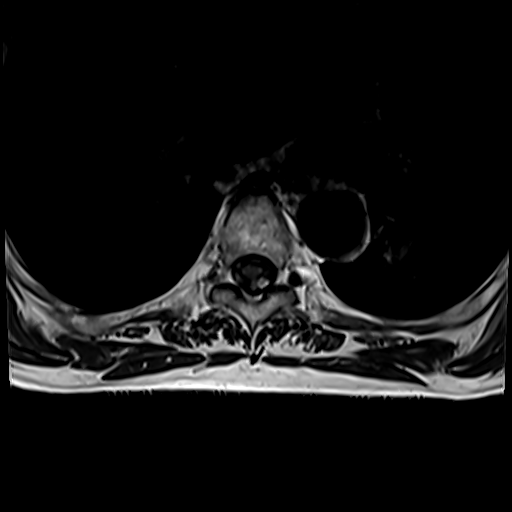
[im 37/44]
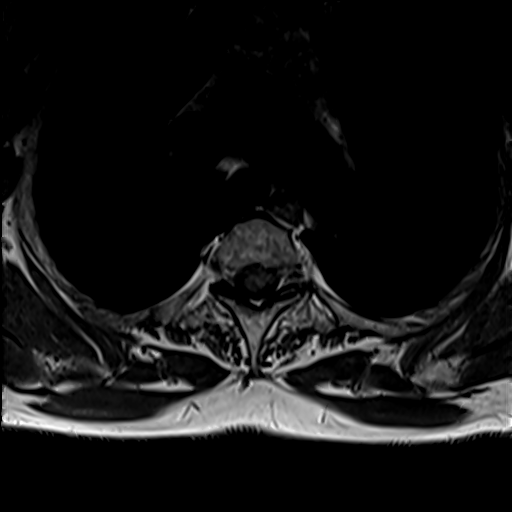
[im 44/44]
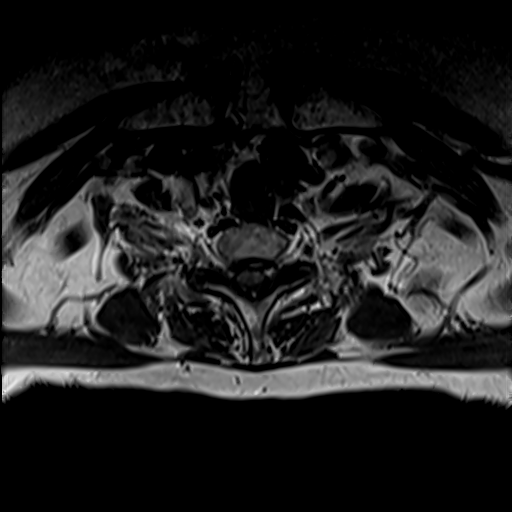

[Series 22: T2 post-contrast · sagittal · 3.0mm · 0.83mm/px · 4 of 19 slices shown]
[im 1/19]
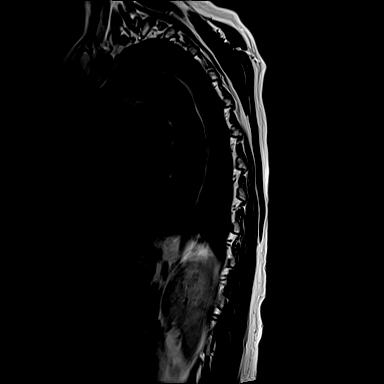
[im 7/19]
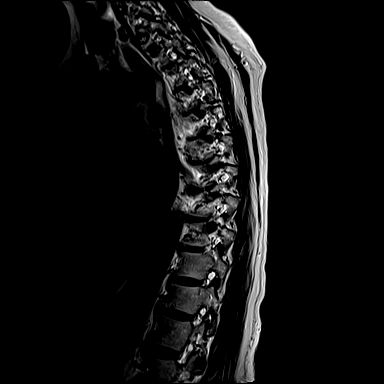
[im 13/19]
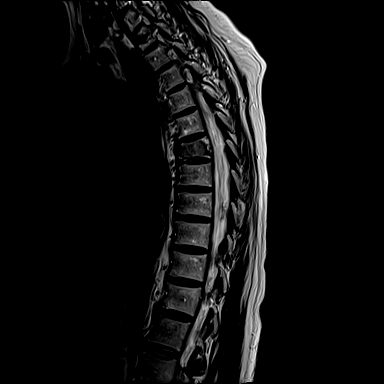
[im 19/19]
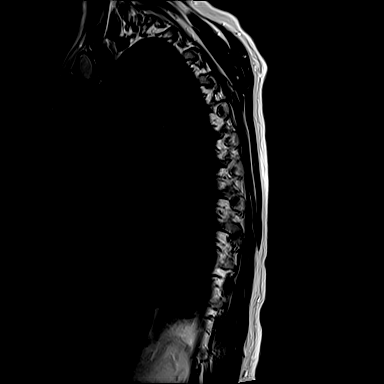

[Series 23: T1 fat-sat post-contrast · sagittal · 3.0mm · 1.00mm/px · 4 of 19 slices shown]
[im 1/19]
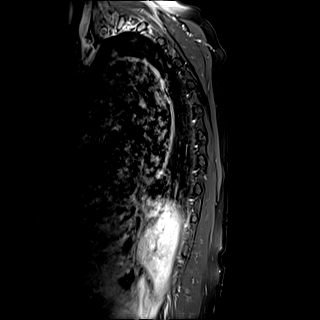
[im 7/19]
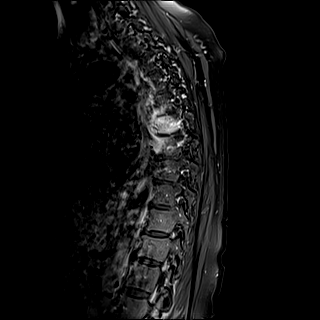
[im 13/19]
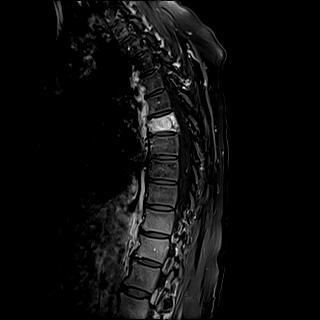
[im 19/19]
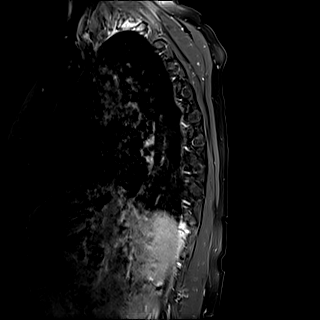

[32 of 48 positions shown; findings below may reference images not displayed]

FINDINGS: Limited cervical spine imaging: Normal for age. Visible cervical
bone marrow signal is normal.

Thoracic spine segmentation:  Appears to be normal.

Alignment: Stable thoracic kyphosis from earlier this month. No
spondylolisthesis.

Vertebrae: T6 compression fracture, with comminution, superior and
inferior endplate deformity. 35-40% central loss of vertebral body
height. Abnormal T1 marrow signal and enhancement throughout the
vertebral body. But no epidural or clear extraosseous extension of
the signal abnormality. There is confluent soft tissue edema about
the body (series 20, image 21). T6 neural foramina remain normal. No
retropulsion or spinal stenosis. T6 pedicles and posterior elements
remain normal.

Elsewhere marrow signal appears normal throughout the thoracic
spine; mild degenerative endplate changes anteriorly at T11-T12.
Occasional benign vertebral body hemangiomas (T11). No posterior rib
edema or metastasis identified. Visible sternum appears normal.

Cord: Normal. Capacious thoracic spinal canal. Normal conus
medullaris at T12. No abnormal intradural enhancement or dural
thickening.

Paraspinal and other soft tissues: T6 paraspinal soft tissue edema
as above. Visible chest and abdominal viscera are stable and
negative; absent right kidney.

Disc levels:

Small disc herniation at T2-T3 without stenosis. Mild to moderate
facet hypertrophy at T10-T11 with mild left T10 foraminal stenosis.

Otherwise age-appropriate thoracic spine degeneration.
IMPRESSION: 1. Moderate T6 compression, indeterminate for pathologic fracture.
There is suspicious decreased T1 signal and enhancement throughout
the vertebral body. But no epidural or extraosseous extension, and
no other suspicious bone lesion in the visible spine or ribs.
35-40% central loss of vertebral height with no retropulsion or
complicating features.

2. Elsewhere essentially normal for age MRI appearance of the
thoracic spine. No spinal stenosis.

## 2021-09-04 MED ORDER — GADOBUTROL 1 MMOL/ML IV SOLN
7.0000 mL | Freq: Once | INTRAVENOUS | Status: AC | PRN
  Administered 2021-09-04: 7 mL via INTRAVENOUS

## 2021-09-05 ENCOUNTER — Other Ambulatory Visit (HOSPITAL_COMMUNITY): Payer: 59

## 2021-09-05 ENCOUNTER — Encounter (HOSPITAL_COMMUNITY): Payer: Self-pay

## 2021-09-06 ENCOUNTER — Other Ambulatory Visit: Payer: Self-pay | Admitting: Neurosurgery

## 2021-09-08 ENCOUNTER — Encounter (HOSPITAL_COMMUNITY): Payer: Self-pay | Admitting: Neurosurgery

## 2021-09-08 ENCOUNTER — Other Ambulatory Visit: Payer: Self-pay

## 2021-09-08 ENCOUNTER — Other Ambulatory Visit (HOSPITAL_COMMUNITY): Payer: 59

## 2021-09-08 NOTE — Progress Notes (Signed)
Dana Hanson denies chest pain or shortness of breath.  Patient denies having any s/s of Covid in her household.  Patient denies any known exposure to Covid.   PCP is Dr. [name].  I instructed Dana Hanson to shower with antibiotic soap, if it is available.  Dry off with a clean towel. Do not put lotion, powder, cologne or deodorant or makeup.No jewelry or piercings. Men may shave their face and neck. Woman should not shave. No nail polish, artificial or acrylic nails. Wear clean clothes, brush your teeth. Glasses, contact lens,dentures or partials may not be worn in the OR. If you need to wear them, please bring a case for glasses, do not wear contacts or bring a case, the hospital does not have contact cases, dentures or partials will have to be removed , make sure they are clean, we will provide a denture cup to put them in. You will need some one to drive you home and a responsible person over the age of 42 to stay with you for the first 24 hours after surgery.

## 2021-09-09 ENCOUNTER — Ambulatory Visit (HOSPITAL_COMMUNITY)
Admission: RE | Admit: 2021-09-09 | Discharge: 2021-09-09 | Disposition: A | Discharge status: Home / Self Care | Payer: 59 | Attending: Neurosurgery | Admitting: Neurosurgery

## 2021-09-09 ENCOUNTER — Encounter (HOSPITAL_COMMUNITY)
Admission: RE | Disposition: A | Discharge status: Home / Self Care | Payer: Self-pay | Source: Home / Self Care | Attending: Neurosurgery

## 2021-09-09 ENCOUNTER — Ambulatory Visit (HOSPITAL_BASED_OUTPATIENT_CLINIC_OR_DEPARTMENT_OTHER): Payer: 59 | Admitting: Anesthesiology

## 2021-09-09 ENCOUNTER — Ambulatory Visit (HOSPITAL_COMMUNITY): Payer: 59

## 2021-09-09 ENCOUNTER — Ambulatory Visit (HOSPITAL_COMMUNITY): Payer: 59 | Admitting: Anesthesiology

## 2021-09-09 ENCOUNTER — Other Ambulatory Visit: Payer: Self-pay

## 2021-09-09 DIAGNOSIS — Z85528 Personal history of other malignant neoplasm of kidney: Secondary | ICD-10-CM | POA: Diagnosis not present

## 2021-09-09 DIAGNOSIS — M4854XA Collapsed vertebra, not elsewhere classified, thoracic region, initial encounter for fracture: Secondary | ICD-10-CM | POA: Diagnosis not present

## 2021-09-09 DIAGNOSIS — Z419 Encounter for procedure for purposes other than remedying health state, unspecified: Secondary | ICD-10-CM

## 2021-09-09 DIAGNOSIS — Z20822 Contact with and (suspected) exposure to covid-19: Secondary | ICD-10-CM | POA: Diagnosis not present

## 2021-09-09 DIAGNOSIS — C7951 Secondary malignant neoplasm of bone: Secondary | ICD-10-CM | POA: Diagnosis not present

## 2021-09-09 HISTORY — PX: VERTEBROPLASTY: SHX113

## 2021-09-09 HISTORY — DX: Malignant (primary) neoplasm, unspecified: C80.1

## 2021-09-09 LAB — SURGICAL PCR SCREEN
MRSA, PCR: NEGATIVE
Staphylococcus aureus: NEGATIVE

## 2021-09-09 LAB — SARS CORONAVIRUS 2 BY RT PCR (HOSPITAL ORDER, PERFORMED IN ~~LOC~~ HOSPITAL LAB): SARS Coronavirus 2: NEGATIVE

## 2021-09-09 IMAGING — RF DG THORACIC SPINE 2V
2 series · 2 of 2 positions shown · non-contrast
Comparison: [DATE]

CLINICAL DATA: Fluoroscopic guidance for biopsy in thoracic spine

EXAM:
THORACIC SPINE 2 VIEWS

[Series 1: run · 1 of 1 slices shown (1 of 2)]
[im 1/1]
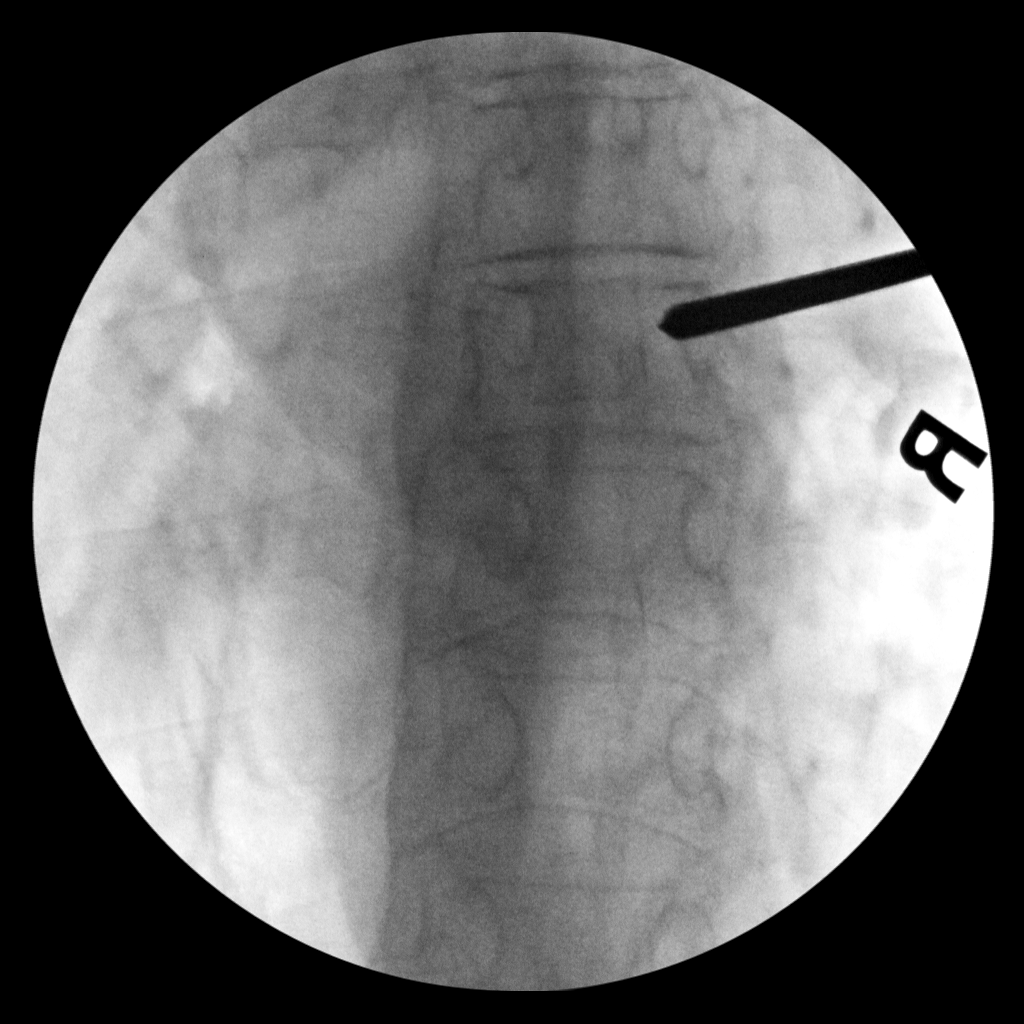

[Series 1: run · 1 of 1 slices shown (2 of 2)]
[im 1/1]
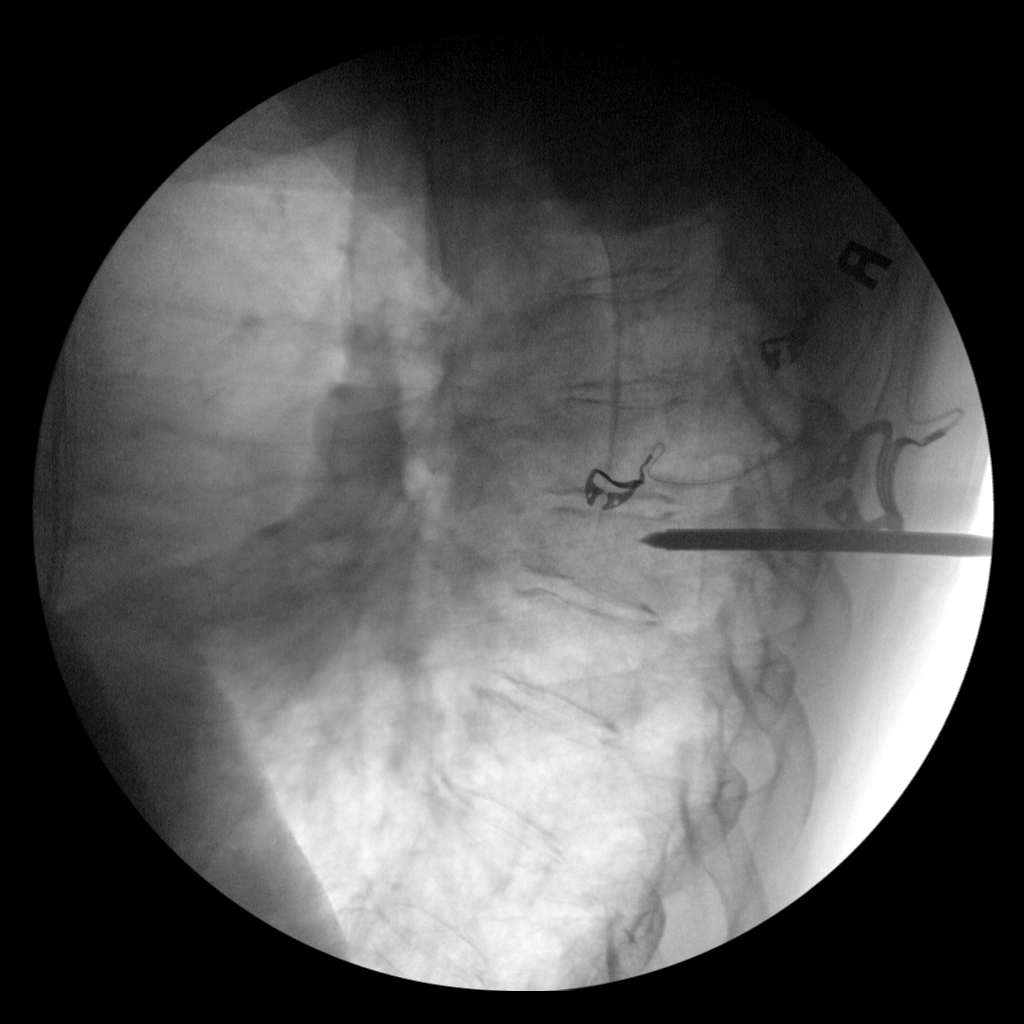

[2 of 2 positions shown; findings below may reference images not displayed]

FINDINGS: Fluoroscopic images show a localizing probe superimposed or lower
thoracic spine. Exact level is not clearly identified. In the MR
thoracic spine done on [DATE], compression fracture was noted in
the T6 vertebral body. Fluoroscopic time was 62 seconds. Radiation
dose is 27 mGy.
IMPRESSION: Fluoroscopic assistance was provided for biopsy in thoracic spine.

## 2021-09-09 SURGERY — VERTEBROPLASTY
Anesthesia: General | Site: Back

## 2021-09-09 MED ORDER — THROMBIN 5000 UNITS EX SOLR
CUTANEOUS | Status: AC
  Filled 2021-09-09: qty 5000

## 2021-09-09 MED ORDER — LACTATED RINGERS IV SOLN: INTRAVENOUS | Status: DC

## 2021-09-09 MED ORDER — FENTANYL CITRATE (PF) 100 MCG/2ML IJ SOLN
25.0000 ug | INTRAMUSCULAR | Status: DC | PRN
  Administered 2021-09-09 (×2): 50 ug via INTRAVENOUS

## 2021-09-09 MED ORDER — MIDAZOLAM HCL 2 MG/2ML IJ SOLN
INTRAMUSCULAR | Status: AC
  Filled 2021-09-09: qty 2

## 2021-09-09 MED ORDER — LIDOCAINE 2% (20 MG/ML) 5 ML SYRINGE
INTRAMUSCULAR | Status: DC | PRN
  Administered 2021-09-09: 60 mg via INTRAVENOUS

## 2021-09-09 MED ORDER — FENTANYL CITRATE (PF) 250 MCG/5ML IJ SOLN
INTRAMUSCULAR | Status: DC | PRN
  Administered 2021-09-09 (×2): 50 ug via INTRAVENOUS

## 2021-09-09 MED ORDER — ONDANSETRON HCL 4 MG/2ML IJ SOLN
INTRAMUSCULAR | Status: DC | PRN
  Administered 2021-09-09: 4 mg via INTRAVENOUS

## 2021-09-09 MED ORDER — ACETAMINOPHEN 10 MG/ML IV SOLN: 1000.0000 mg | Freq: Once | INTRAVENOUS | Status: DC | PRN

## 2021-09-09 MED ORDER — ORAL CARE MOUTH RINSE: 15.0000 mL | Freq: Once | OROMUCOSAL | Status: AC

## 2021-09-09 MED ORDER — OXYCODONE HCL ER 10 MG PO T12A: 10.0000 mg | EXTENDED_RELEASE_TABLET | Freq: Once | ORAL | Status: DC

## 2021-09-09 MED ORDER — CELECOXIB 200 MG PO CAPS
200.0000 mg | ORAL_CAPSULE | Freq: Once | ORAL | Status: AC
  Administered 2021-09-09: 200 mg via ORAL
  Filled 2021-09-09: qty 1

## 2021-09-09 MED ORDER — CHLORHEXIDINE GLUCONATE 0.12 % MT SOLN: 15.0000 mL | Freq: Once | OROMUCOSAL | Status: AC

## 2021-09-09 MED ORDER — CEFAZOLIN SODIUM-DEXTROSE 2-4 GM/100ML-% IV SOLN
2.0000 g | INTRAVENOUS | Status: AC
  Administered 2021-09-09: 2 g via INTRAVENOUS
  Filled 2021-09-09: qty 100

## 2021-09-09 MED ORDER — HYDROCODONE-ACETAMINOPHEN 5-325 MG PO TABS
1.0000 | ORAL_TABLET | Freq: Four times a day (QID) | ORAL | 0 refills | Status: DC | PRN
Start: 2021-09-09 — End: 2021-10-12

## 2021-09-09 MED ORDER — ACETAMINOPHEN 500 MG PO TABS
1000.0000 mg | ORAL_TABLET | Freq: Once | ORAL | Status: AC
  Administered 2021-09-09: 1000 mg via ORAL
  Filled 2021-09-09: qty 2

## 2021-09-09 MED ORDER — EPHEDRINE SULFATE-NACL 50-0.9 MG/10ML-% IV SOSY
PREFILLED_SYRINGE | INTRAVENOUS | Status: DC | PRN
  Administered 2021-09-09: 15 mg via INTRAVENOUS
  Administered 2021-09-09: 10 mg via INTRAVENOUS

## 2021-09-09 MED ORDER — CHLORHEXIDINE GLUCONATE 0.12 % MT SOLN
OROMUCOSAL | Status: AC
  Administered 2021-09-09: 15 mL
  Filled 2021-09-09: qty 15

## 2021-09-09 MED ORDER — PROPOFOL 10 MG/ML IV BOLUS
INTRAVENOUS | Status: DC | PRN
  Administered 2021-09-09: 200 mg via INTRAVENOUS

## 2021-09-09 MED ORDER — LIDOCAINE-EPINEPHRINE 0.5 %-1:200000 IJ SOLN
INTRAMUSCULAR | Status: AC
  Filled 2021-09-09: qty 1

## 2021-09-09 MED ORDER — FENTANYL CITRATE (PF) 250 MCG/5ML IJ SOLN
INTRAMUSCULAR | Status: AC
  Filled 2021-09-09: qty 5

## 2021-09-09 MED ORDER — SUGAMMADEX SODIUM 200 MG/2ML IV SOLN
INTRAVENOUS | Status: DC | PRN
  Administered 2021-09-09: 200 mg via INTRAVENOUS

## 2021-09-09 MED ORDER — THROMBIN 5000 UNITS EX SOLR
CUTANEOUS | Status: DC | PRN
  Administered 2021-09-09: 5 mL via TOPICAL

## 2021-09-09 MED ORDER — LIDOCAINE-EPINEPHRINE 0.5 %-1:200000 IJ SOLN
INTRAMUSCULAR | Status: DC | PRN
  Administered 2021-09-09: 5 mL via INTRADERMAL

## 2021-09-09 MED ORDER — MIDAZOLAM HCL 2 MG/2ML IJ SOLN
INTRAMUSCULAR | Status: DC | PRN
Start: 2021-09-09 — End: 2021-09-09
  Administered 2021-09-09: 2 mg via INTRAVENOUS

## 2021-09-09 MED ORDER — 0.9 % SODIUM CHLORIDE (POUR BTL) OPTIME
TOPICAL | Status: DC | PRN
  Administered 2021-09-09: 1000 mL

## 2021-09-09 MED ORDER — CHLORHEXIDINE GLUCONATE CLOTH 2 % EX PADS: 6.0000 | MEDICATED_PAD | Freq: Once | CUTANEOUS | Status: DC

## 2021-09-09 MED ORDER — FENTANYL CITRATE (PF) 100 MCG/2ML IJ SOLN
INTRAMUSCULAR | Status: AC
  Filled 2021-09-09: qty 2

## 2021-09-09 MED ORDER — DEXAMETHASONE SODIUM PHOSPHATE 10 MG/ML IJ SOLN
INTRAMUSCULAR | Status: DC | PRN
  Administered 2021-09-09 (×2): 10 mg via INTRAVENOUS

## 2021-09-09 MED ORDER — ROCURONIUM BROMIDE 10 MG/ML (PF) SYRINGE
PREFILLED_SYRINGE | INTRAVENOUS | Status: DC | PRN
  Administered 2021-09-09: 50 mg via INTRAVENOUS

## 2021-09-09 SURGICAL SUPPLY — 41 items
ADH SKN CLS APL DERMABOND .7 (GAUZE/BANDAGES/DRESSINGS) ×1
BAG COUNTER SPONGE SURGICOUNT (BAG) ×3 IMPLANT
BAG SPNG CNTER NS LX DISP (BAG) ×1
BLADE CLIPPER SURG (BLADE) IMPLANT
BLADE SURG 15 STRL LF DISP TIS (BLADE) ×2 IMPLANT
BLADE SURG 15 STRL SS (BLADE) ×2
CARTRIDGE OIL MAESTRO DRILL (MISCELLANEOUS) ×2 IMPLANT
DERMABOND ADVANCED (GAUZE/BANDAGES/DRESSINGS) ×1
DERMABOND ADVANCED .7 DNX12 (GAUZE/BANDAGES/DRESSINGS) ×2 IMPLANT
DEVICE BIOPSY BONE KYPHX (INSTRUMENTS) ×1 IMPLANT
DIFFUSER DRILL AIR PNEUMATIC (MISCELLANEOUS) ×3 IMPLANT
DRAPE C-ARM 42X72 X-RAY (DRAPES) ×3 IMPLANT
DRAPE HALF SHEET 40X57 (DRAPES) ×3 IMPLANT
DRAPE LAPAROTOMY 100X72X124 (DRAPES) ×3 IMPLANT
DRAPE SURG 17X23 STRL (DRAPES) ×3 IMPLANT
DURAPREP 26ML APPLICATOR (WOUND CARE) ×3 IMPLANT
GAUZE 4X4 16PLY ~~LOC~~+RFID DBL (SPONGE) ×3 IMPLANT
GLOVE EXAM NITRILE XL STR (GLOVE) IMPLANT
GLOVE SURG LTX SZ6.5 (GLOVE) ×3 IMPLANT
GLOVE SURG LTX SZ7 (GLOVE) ×1 IMPLANT
GLOVE SURG UNDER POLY LF SZ7.5 (GLOVE) ×1 IMPLANT
GOWN STRL REUS W/ TWL LRG LVL3 (GOWN DISPOSABLE) ×4 IMPLANT
GOWN STRL REUS W/ TWL XL LVL3 (GOWN DISPOSABLE) IMPLANT
GOWN STRL REUS W/TWL 2XL LVL3 (GOWN DISPOSABLE) IMPLANT
GOWN STRL REUS W/TWL LRG LVL3 (GOWN DISPOSABLE) ×6
GOWN STRL REUS W/TWL XL LVL3 (GOWN DISPOSABLE)
KIT BASIN OR (CUSTOM PROCEDURE TRAY) ×3 IMPLANT
KIT OSTEOCOOL BONE ACCESS 8G (MISCELLANEOUS) ×1 IMPLANT
KIT TURNOVER KIT B (KITS) ×3 IMPLANT
NDL HYPO 25X1 1.5 SAFETY (NEEDLE) ×2 IMPLANT
NEEDLE HYPO 25X1 1.5 SAFETY (NEEDLE) ×2 IMPLANT
NS IRRIG 1000ML POUR BTL (IV SOLUTION) ×3 IMPLANT
OIL CARTRIDGE MAESTRO DRILL (MISCELLANEOUS) ×2
PACK SURGICAL SETUP 50X90 (CUSTOM PROCEDURE TRAY) ×3 IMPLANT
PAD ARMBOARD 7.5X6 YLW CONV (MISCELLANEOUS) ×9 IMPLANT
SPECIMEN JAR SMALL (MISCELLANEOUS) IMPLANT
STAPLER SKIN PROX WIDE 3.9 (STAPLE) ×3 IMPLANT
SUT VIC AB 3-0 SH 8-18 (SUTURE) ×3 IMPLANT
SYR CONTROL 10ML LL (SYRINGE) ×6 IMPLANT
TOWEL GREEN STERILE (TOWEL DISPOSABLE) ×3 IMPLANT
TOWEL GREEN STERILE FF (TOWEL DISPOSABLE) ×3 IMPLANT

## 2021-09-09 NOTE — Anesthesia Preprocedure Evaluation (Signed)
Anesthesia Evaluation  Patient identified by MRN, date of birth, ID band Patient awake    Reviewed: Allergy & Precautions, NPO status , Patient's Chart, lab work & pertinent test results  Airway Mallampati: II  TM Distance: >3 FB Neck ROM: Full    Dental no notable dental hx.    Pulmonary neg pulmonary ROS,    Pulmonary exam normal        Cardiovascular negative cardio ROS   Rhythm:Regular Rate:Normal     Neuro/Psych negative neurological ROS  negative psych ROS   GI/Hepatic negative GI ROS, Neg liver ROS,   Endo/Other  negative endocrine ROS  Renal/GU negative Renal ROS  negative genitourinary   Musculoskeletal  (+) Arthritis , T6 compression fx   Abdominal Normal abdominal exam  (+)   Peds  Hematology negative hematology ROS (+)   Anesthesia Other Findings   Reproductive/Obstetrics                             Anesthesia Physical Anesthesia Plan  ASA: 2  Anesthesia Plan: General   Post-op Pain Management: Tylenol PO (pre-op)* and Celebrex PO (pre-op)*   Induction: Intravenous  PONV Risk Score and Plan: 3 and Ondansetron, Dexamethasone, Midazolam and Treatment may vary due to age or medical condition  Airway Management Planned: Mask and Oral ETT  Additional Equipment: None  Intra-op Plan:   Post-operative Plan: Extubation in OR  Informed Consent: I have reviewed the patients History and Physical, chart, labs and discussed the procedure including the risks, benefits and alternatives for the proposed anesthesia with the patient or authorized representative who has indicated his/her understanding and acceptance.     Dental advisory given  Plan Discussed with: CRNA  Anesthesia Plan Comments: (Lab Results      Component                Value               Date                      WBC                      5.2                 08/20/2021                HGB                       12.9                08/20/2021                HCT                      40.5                08/20/2021                MCV                      90.0                08/20/2021                PLT  250                 08/20/2021           Lab Results      Component                Value               Date                      NA                       139                 08/20/2021                K                        3.8                 08/20/2021                CO2                      28                  08/20/2021                GLUCOSE                  113 (H)             08/20/2021                BUN                      24 (H)              08/20/2021                CREATININE               1.11 (H)            08/20/2021                CALCIUM                  9.3                 08/20/2021                GFRNONAA                 57 (L)              08/20/2021          )        Anesthesia Quick Evaluation

## 2021-09-09 NOTE — Anesthesia Postprocedure Evaluation (Signed)
Anesthesia Post Note  Patient: Dana Hanson  Procedure(s) Performed: Thoracic vertebral biopsy (Back)     Patient location during evaluation: PACU Anesthesia Type: General Level of consciousness: awake and alert Pain management: pain level controlled Vital Signs Assessment: post-procedure vital signs reviewed and stable Respiratory status: spontaneous breathing, nonlabored ventilation and respiratory function stable Cardiovascular status: stable and blood pressure returned to baseline Anesthetic complications: no   No notable events documented.  Last Vitals:  Vitals:   09/09/21 2015 09/09/21 2024  BP: 138/83   Pulse: 79 88  Resp: 16 (!) 25  Temp:  (!) 36.4 C  SpO2: 100% 98%    Last Pain:  Vitals:   09/09/21 2015  TempSrc:   PainSc: [place]

## 2021-09-09 NOTE — Anesthesia Procedure Notes (Signed)
Procedure Name: Intubation Date/Time: 09/09/2021 6:25 PM Performed by: Thelma Comp, CRNA Pre-anesthesia Checklist: Patient identified, Emergency Drugs available, Suction available and Patient being monitored Patient Re-evaluated:Patient Re-evaluated prior to induction Oxygen Delivery Method: Circle System Utilized Preoxygenation: Pre-oxygenation with 100% oxygen Induction Type: IV induction Ventilation: Mask ventilation without difficulty Laryngoscope Size: Mac and 4 Grade View: Grade I Tube type: Oral Tube size: 7.0 mm Number of attempts: 1 Airway Equipment and Method: Stylet Placement Confirmation: ETT inserted through vocal cords under direct vision, positive ETCO2 and breath sounds checked- equal and bilateral Secured at: 22 cm Tube secured with: Tape Dental Injury: Teeth and Oropharynx as per pre-operative assessment

## 2021-09-09 NOTE — Discharge Instructions (Addendum)
Call if your temperature is greater than 101.5 If your wound becomes red, drains, or abnormally tender

## 2021-09-09 NOTE — Transfer of Care (Signed)
Immediate Anesthesia Transfer of Care Note  Patient: Dana Hanson  Procedure(s) Performed: Thoracic vertebral biopsy (Back)  Patient Location: PACU  Anesthesia Type:General  Level of Consciousness: drowsy and patient cooperative  Airway & Oxygen Therapy: Patient Spontanous Breathing  Post-op Assessment: Report given to RN and Post -op Vital signs reviewed and stable  Post vital signs: Reviewed and stable  Last Vitals:  Vitals Value Taken Time  BP 123/97 09/09/21 1942  Temp 36.5 C 09/09/21 1942  Pulse 87 09/09/21 1943  Resp 21 09/09/21 1943  SpO2 96 % 09/09/21 1943  Vitals shown include unvalidated device data.  Last Pain:  Vitals:   09/09/21 1250  TempSrc:   PainSc: 2       Patients Stated Pain Goal: 2 (96/04/54 0981)  Complications: No notable events documented.

## 2021-09-09 NOTE — H&P (Signed)
BP (!) 140/51    Pulse (!) 52    Temp 97.8 F (36.6 C) (Oral)    Resp 18    Ht 5\' 7"  (1.702 m)    Wt 73.5 kg    LMP 10/27/2013    SpO2 100%    BMI 25.37 kg/m  Dana Hanson has a compression fracture which at this time is subacute. She also has a history of renal cell CA. Given the lack of a traumatic etiology, and the equivocal results of an MRI it was felt that she undergo a biopsy of the vertebral body. She is otherwise except for much improved back pain asymptomatic. No Known Allergies Past Medical History:  Diagnosis Date   Cancer ([place])    kidney   Gallstone pancreatitis    Hyperlipidemia    IBS (irritable bowel syndrome)    Right renal mass    Past Surgical History:  Procedure Laterality Date   CESAREAN SECTION     x 2-tubal with last c-section   CHOLECYSTECTOMY N/A 09/03/2015   Procedure: LAPAROSCOPIC CHOLECYSTECTOMY WITH INTRAOPERATIVE CHOLANGIOGRAM;  Surgeon: Mickeal Skinner, MD;  Location: [facility];  Service: General;  Laterality: N/A;   COLONOSCOPY  08/14/2011   Procedure: COLONOSCOPY;  Surgeon: Daneil Dolin, MD;  [facility]: [facility];  Service: Endoscopy;  Laterality: N/A;  10:00   HEMORRHOID SURGERY  11/10/2011   Procedure: HEMORRHOIDECTOMY;  Surgeon: Donato Heinz, MD;  Location: [facility];  Service: General;  Laterality: N/A;   [place] RESECTION  01-2015   LAPAROSCOPIC NEPHRECTOMY N/A 09/03/2015   Procedure: LAPAROSCOPIC RADICAL NEPHRECTOMY;  Surgeon: Ardis Hughs, MD;  Location: [facility];  Service: Urology;  Laterality: N/A;   TUBAL LIGATION     with last c-section   Family History  Adopted: Yes  Problem Relation Age of Onset   Colon cancer Neg Hx    Liver disease Neg Hx    Anesthesia problems Neg Hx    Hypotension Neg Hx    Malignant hyperthermia Neg Hx    Pseudochol deficiency Neg Hx    Social History   Socioeconomic History   Marital status: Divorced    Spouse name: Not on file   Number of children: 2   Years of education: Not  on file   Highest education level: Bachelor's degree (e.g., BA, AB, BS)  Occupational History    Employer: [id number]  Tobacco Use   Smoking status: Never   Smokeless tobacco: Never  Vaping Use   Vaping Use: Never used  Substance and Sexual Activity   Alcohol use: No   Drug use: No   Sexual activity: Yes    Birth control/protection: Surgical  Other Topics Concern   Not on file  Social History Narrative   Lives alone   Social Determinants of Health   Financial Resource Strain: Not on file  Food Insecurity: Not on file  Transportation Needs: Not on file  Physical Activity: Not on file  Stress: Not on file  Social Connections: Not on file  Intimate Partner Violence: Not on file   Prior to Admission medications   Medication Sig Start Date End Date Taking? Authorizing Provider  acetaminophen (TYLENOL) 325 MG tablet Take 2 tablets (650 mg total) by mouth every 6 (six) hours as needed for moderate pain. 08/07/21  Yes [name], PA-C  Multiple Vitamin (MULITIVITAMIN WITH MINERALS) TABS Take 1 tablet by mouth daily.   Yes [provider]  pregabalin (LYRICA) 50 MG capsule Take 50 mg  by mouth 3 (three) times daily. 07/22/21  Yes [provider]  tiZANidine (ZANAFLEX) 4 MG tablet Take 1 tablet (4 mg total) by mouth at bedtime. Patient taking differently: Take 4 mg by mouth daily as needed for muscle spasms. 08/07/21  Yes [name], PA-C   Physical Exam Constitutional:      Appearance: Normal appearance. She is normal weight.  HENT:     Head: Normocephalic and atraumatic.     Right Ear: Tympanic membrane and external ear normal.     Left Ear: Tympanic membrane and external ear normal.     Nose: Nose normal.     Mouth/Throat:     Mouth: Mucous membranes are moist.     Pharynx: Oropharynx is clear.  Eyes:     Extraocular Movements: Extraocular movements intact.     Conjunctiva/sclera: Conjunctivae normal.     Pupils: Pupils are equal, round, and reactive to light.   Cardiovascular:     Rate and Rhythm: Normal rate and regular rhythm.  Pulmonary:     Effort: Pulmonary effort is normal.     Breath sounds: Normal breath sounds.  Abdominal:     General: Abdomen is flat.     Palpations: Abdomen is soft.  Musculoskeletal:        General: Normal range of motion.     Cervical back: Normal range of motion.  Skin:    General: Skin is warm and dry.  Neurological:     General: No focal deficit present.     Mental Status: She is alert and oriented to person, place, and time.     Cranial Nerves: Cranial nerves 2-12 are intact.     Sensory: Sensation is intact.     Motor: Motor function is intact.     Coordination: Coordination is intact.     Deep Tendon Reflexes: Reflexes are normal and symmetric.  Psychiatric:        Mood and Affect: Mood normal.        Behavior: Behavior normal.        Thought Content: Thought content normal.        Judgment: Judgment normal.  Admit for thoracic fracture biopsy

## 2021-09-09 NOTE — Op Note (Signed)
09/09/2021  7:55 PM  PATIENT:  Dana Hanson  61 y.o. female With history of renal cell CA. Non traumatic compression fracture at T6 . Given history I recommended a biopsy which she has agreed to.  PRE-OPERATIVE DIAGNOSIS:  Thoracic Six Compression fracture  POST-OPERATIVE DIAGNOSIS:  Thoracic Six Compression fracture  PROCEDURE:  Procedure(s): Thoracic vertebral biopsy  SURGEON:  Surgeon(s): Ashok Pall, MD  ANESTHESIA:   general  EBL:  Total I/O In: 1000 [I.V.:1000] Out: 10 [Blood:10]  BLOOD ADMINISTERED:none  COUNT:per nursing  SPECIMEN:  Source of Specimen:  T6 vertebral body  DICTATION: [name] was taken to the operating room, intubated and placed under a general anesthetic without difficulty. Shewas positioned prone on the operating room table with all pressure points properly padded. Her back was prepped and draped in a sterile manner. With fluoroscopy I localized the T6 pedicles bilaterally. I injected lidocaine into the entry site on the right side. I started by making a stab incision on the right side and entering the right T6 pedicle with fluoroscopic guidance. Once good position was obtained, I drilled into the vertebral body. I then placed the biopsy needle into the T6 vertebra and removed three samples from the vertebral body. The samples were for the most part bloody. Iremoved the instrumentation from the vertebral body, and the final films looked good. I closed the stab incision with vicryl suture and used Dermabond for a sterile dressing. Marland Kitchen    PLAN OF CARE: discharge home from pacu  PATIENT DISPOSITION:  PACU - hemodynamically stable.   Delay start of Pharmacological VTE agent (>24hrs) due to surgical blood loss or risk of bleeding:  yes

## 2021-09-10 ENCOUNTER — Encounter (HOSPITAL_COMMUNITY): Payer: Self-pay | Admitting: Neurosurgery

## 2021-09-13 LAB — SURGICAL PATHOLOGY

## 2021-09-16 ENCOUNTER — Encounter: Payer: Self-pay | Admitting: Radiation Oncology

## 2021-09-16 ENCOUNTER — Inpatient Hospital Stay: Payer: 59 | Attending: Oncology | Admitting: Oncology

## 2021-09-16 ENCOUNTER — Telehealth: Payer: Self-pay | Admitting: Genetic Counselor

## 2021-09-16 ENCOUNTER — Other Ambulatory Visit: Payer: Self-pay | Admitting: Radiation Therapy

## 2021-09-16 ENCOUNTER — Telehealth: Payer: Self-pay | Admitting: Oncology

## 2021-09-16 DIAGNOSIS — C7951 Secondary malignant neoplasm of bone: Secondary | ICD-10-CM | POA: Insufficient documentation

## 2021-09-16 DIAGNOSIS — C649 Malignant neoplasm of unspecified kidney, except renal pelvis: Secondary | ICD-10-CM | POA: Insufficient documentation

## 2021-09-16 DIAGNOSIS — C641 Malignant neoplasm of right kidney, except renal pelvis: Secondary | ICD-10-CM

## 2021-09-16 DIAGNOSIS — Z905 Acquired absence of kidney: Secondary | ICD-10-CM | POA: Insufficient documentation

## 2021-09-16 NOTE — Progress Notes (Signed)
Hematology and Oncology consult for Telemedicine Visits  Dana Hanson [id number] August 02, 1960 61 y.o. 09/16/2021 10:25 AM Dana Hanson, MDFagan, [name], MD   I connected with Dana Hanson on 09/16/21 at  3:45 PM EST by telephone visit and verified that I am speaking with the correct person using two identifiers.   I discussed the limitations, risks, security and privacy concerns of performing an evaluation and management service by telemedicine and the availability of in-person appointments. I also discussed with the patient that there may be a patient responsible charge related to this service. The patient expressed understanding and agreed to proceed.  Other persons participating in the visit and their role in the encounter: None  Patient's location: Home Provider's location: Office    Reason for the request:    Kidney cancer  HPI: I was asked by Dr. Christella Noa to evaluate Ms. Dana Hanson for the evaluation of advanced kidney cancer.  She is a 61 year old woman who was diagnosed with kidney cancer in 2017.  She was found to have an incidental large kidney mass on the CT scan after gallstone pancreatitis.  She underwent a laparoscopic right radical nephrectomy performed by Dr. Louis Meckel in February 2017.  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  She remained disease-free and active surveillance for the next few years without any evidence of relapse.  In January 2023 she started developing back pain and was evaluated in the emergency department and subsequently had CT scan of the chest with contrast to rule out pulmonary embolism on August 20, 2021.  The scan showed no evidence of pulmonary embolism for tumors in the lung but acute compression fracture noted at T6.  MRI of the thoracic spine showed a T6 compression fracture with suspicious decreased T1 signal and enhancement  throughout the vertebral body.  No epidural or extraosseous extension noted at that time.  She underwent surgical biopsy completed by Dr. 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  Clinically, she reports her pain is better after surgery but still limited in her mobility.  She is not using any pain medication but has not exerted herself.  She denies any other complications at this time.      Past Medical History:  Diagnosis Date   Cancer ([place])    kidney   Gallstone pancreatitis    Hyperlipidemia    IBS (irritable bowel syndrome)    Right renal mass   :   Past Surgical History:  Procedure Laterality Date   CESAREAN SECTION     x 2-tubal with last c-section   CHOLECYSTECTOMY N/A 09/03/2015   Procedure: LAPAROSCOPIC CHOLECYSTECTOMY WITH INTRAOPERATIVE CHOLANGIOGRAM;  Surgeon: Mickeal Skinner, MD;  Location: [facility];  Service: General;  Laterality: N/A;   COLONOSCOPY  08/14/2011   Procedure: COLONOSCOPY;  Surgeon: Daneil Dolin, MD;  [facility];  Service: Endoscopy;  Laterality: N/A;  10:00   HEMORRHOID SURGERY  11/10/2011   Procedure: HEMORRHOIDECTOMY;  Surgeon: Donato Heinz, MD;  Location: [facility];  Service: General;  Laterality: N/A;   [place] RESECTION  01-2015   LAPAROSCOPIC NEPHRECTOMY N/A 09/03/2015   Procedure: LAPAROSCOPIC RADICAL NEPHRECTOMY;  Surgeon: Ardis Hughs, MD;  Location: [facility];  Service: Urology;  Laterality: N/A;   TUBAL LIGATION     with last c-section   VERTEBROPLASTY N/A 09/09/2021  Procedure: Thoracic vertebral biopsy;  Surgeon: Ashok Pall, MD;  Location: [place];  Service: Neurosurgery;  Laterality: N/A;  RM 21  :   Current Outpatient Medications:    acetaminophen (TYLENOL) 325 MG tablet, Take 2 tablets (650 mg total) by mouth every 6 (six) hours as needed for moderate pain., Disp: 30 tablet, Rfl: 0   HYDROcodone-acetaminophen (NORCO/VICODIN) 5-325 MG tablet,  Take 1 tablet by mouth every 6 (six) hours as needed for moderate pain., Disp: 30 tablet, Rfl: 0   Multiple Vitamin (MULITIVITAMIN WITH MINERALS) TABS, Take 1 tablet by mouth daily., Disp: , Rfl:    pregabalin (LYRICA) 50 MG capsule, Take 50 mg by mouth 3 (three) times daily., Disp: , Rfl:    tiZANidine (ZANAFLEX) 4 MG tablet, Take 1 tablet (4 mg total) by mouth at bedtime. (Patient taking differently: Take 4 mg by mouth daily as needed for muscle spasms.), Disp: 30 tablet, Rfl: 0:  No Known Allergies:   Family History  Adopted: Yes  Problem Relation Age of Onset   Colon cancer Neg Hx    Liver disease Neg Hx    Anesthesia problems Neg Hx    Hypotension Neg Hx    Malignant hyperthermia Neg Hx    Pseudochol deficiency Neg Hx   :   Social History   Socioeconomic History   Marital status: Divorced    Spouse name: Not on file   Number of children: 2   Years of education: Not on file   Highest education level: Bachelor's degree (e.g., BA, AB, BS)  Occupational History    Employer: [id number]  Tobacco Use   Smoking status: Never   Smokeless tobacco: Never  Vaping Use   Vaping Use: Never used  Substance and Sexual Activity   Alcohol use: No   Drug use: No   Sexual activity: Yes    Birth control/protection: Surgical  Other Topics Concern   Not on file  Social History Narrative   Lives alone   Social Determinants of Health   Financial Resource Strain: Not on file  Food Insecurity: Not on file  Transportation Needs: Not on file  Physical Activity: Not on file  Stress: Not on file  Social Connections: Not on file  Intimate Partner Violence: Not on file  :    DG Thoracic Spine 2 View  Result Date: 09/09/2021 CLINICAL DATA:  Fluoroscopic guidance for biopsy in thoracic spine EXAM: THORACIC SPINE 2 VIEWS COMPARISON:  08/07/2021 FINDINGS: Fluoroscopic images show a localizing probe superimposed or lower thoracic spine. Exact level is not clearly identified. In the MR thoracic  spine done on 09/04/2021, compression fracture was noted in the T6 vertebral body. Fluoroscopic time was 62 seconds. Radiation dose is 27 mGy. IMPRESSION: Fluoroscopic assistance was provided for biopsy in thoracic spine. Electronically Signed   By: Elmer Picker M.D.   On: 09/09/2021 20:01   CT Angio Chest PE W and/or Wo Contrast  Result Date: 08/20/2021 CLINICAL DATA:  Pulmonary embolus suspected with high probability. Thoracic pain with breathing. History of renal cell cancer. Back pain for 2 weeks. EXAM: CT ANGIOGRAPHY CHEST WITH CONTRAST TECHNIQUE: Multidetector CT imaging of the chest was performed using the standard protocol during bolus administration of intravenous contrast. Multiplanar CT image reconstructions and MIPs were obtained to evaluate the vascular anatomy. RADIATION DOSE REDUCTION: This exam was performed according to the departmental dose-optimization program which includes automated exposure control, adjustment of the mA and/or kV according to patient size and/or use of iterative reconstruction technique.  CONTRAST:  162mL OMNIPAQUE IOHEXOL 350 MG/ML SOLN COMPARISON:  Chest radiograph 08/26/2019 FINDINGS: Cardiovascular: Good opacification of the central and segmental pulmonary arteries. No focal filling defects. No evidence of significant pulmonary embolus. Normal heart size. No pericardial effusions. Normal caliber thoracic aorta. No aortic dissection. Great vessel origins are patent. Mediastinum/Nodes: Subcentimeter thyroid nodules and calcifications. By size criteria, no follow-up is indicated. Esophagus is decompressed. No significant lymphadenopathy. Lungs/Pleura: Mild dependent changes in the lungs. No airspace disease or consolidation. No pleural effusions. No pneumothorax. Airways are patent. Upper Abdomen: No acute changes are identified in the visualized upper abdomen. Musculoskeletal: Degenerative changes in the spine. Heterogeneous sclerosis and compression of a midthoracic  vertebra, likely T6. Appearance is suspicious for metastasis. No retropulsion of fracture fragments. Review of the MIP images confirms the above findings. IMPRESSION: 1. No evidence of significant pulmonary embolus. 2. Lungs are clear. 3. Acute appearing compression fracture of the T6 vertebra with sclerosis, suspicious for pathologic fracture. Electronically Signed   By: Lucienne Capers M.D.   On: 08/20/2021 22:09   MR THORACIC SPINE W WO CONTRAST  Result Date: 09/05/2021 CLINICAL DATA:  61 year old female with T6 compression fracture. Renal cell carcinoma. EXAM: MRI THORACIC WITHOUT AND WITH CONTRAST TECHNIQUE: Multiplanar and multiecho pulse sequences of the thoracic spine were obtained without and with intravenous contrast. CONTRAST:  63mL GADAVIST GADOBUTROL 1 MMOL/ML IV SOLN COMPARISON:  CTA chest 08/20/2021. FINDINGS: Limited cervical spine imaging: Normal for age. Visible cervical bone marrow signal is normal. Thoracic spine segmentation:  Appears to be normal. Alignment: Stable thoracic kyphosis from earlier this month. No spondylolisthesis. Vertebrae: T6 compression fracture, with comminution, superior and inferior endplate deformity. 35-40% central loss of vertebral body height. Abnormal T1 marrow signal and enhancement throughout the vertebral body. But no epidural or clear extraosseous extension of the signal abnormality. There is confluent soft tissue edema about the body (series 20, image 21). T6 neural foramina remain normal. No retropulsion or spinal stenosis. T6 pedicles and posterior elements remain normal. Elsewhere marrow signal appears normal throughout the thoracic spine; mild degenerative endplate changes anteriorly at T11-T12. Occasional benign vertebral body hemangiomas (T11). No posterior rib edema or metastasis identified. Visible sternum appears normal. Cord: Normal. Capacious thoracic spinal canal. Normal conus medullaris at T12. No abnormal intradural enhancement or dural  thickening. Paraspinal and other soft tissues: T6 paraspinal soft tissue edema as above. Visible chest and abdominal viscera are stable and negative; absent right kidney. Disc levels: Small disc herniation at T2-T3 without stenosis. Mild to moderate facet hypertrophy at T10-T11 with mild left T10 foraminal stenosis. Otherwise age-appropriate thoracic spine degeneration. IMPRESSION: 1. Moderate T6 compression, indeterminate for pathologic fracture. There is suspicious decreased T1 signal and enhancement throughout the vertebral body. But no epidural or extraosseous extension, and no other suspicious bone lesion in the visible spine or ribs. 35-40% central loss of vertebral height with no retropulsion or complicating features. 2. Elsewhere essentially normal for age MRI appearance of the thoracic spine. No spinal stenosis. Electronically Signed   By: Genevie Ann M.D.   On: 09/05/2021 09:11   DG C-Arm 1-60 Min-No Report  Result Date: 09/09/2021 Fluoroscopy was utilized by the requesting physician.  No radiographic interpretation.    Assessment and Plan:   89 year old with:  1.  Kidney cancer diagnosed in 2017.  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  She developed stage IV disease documented metastatic lesion to the T6 vertebral  body.  He is status post biopsy but no definitive treatment.  Her case was discussed today in detail with Dr. Christella Noa regarding further surgical intervention including removing any residual tumor and possible vertebroplasty.  Alternative options such as radiation therapy could also be considered but I feel would be preferable postoperatively at that time.  Before proceeding with any additional surgical intervention, I have recommended obtaining PET imaging to rule out any metastatic disease.  If no evidence of metastasis noted that now local therapy to her T6 lesion followed by potentially adjuvant immunotherapy could be  considered  If she has widespread metastatic disease noted then systemic therapy would be considered.   This was discussed with the patient today and she is agreeable with this plan.  2.  Follow-up: will be in the near future after she completes her imaging studies, surgery and possible radiation.  I discussed the assessment and treatment plan with the patient. The patient was provided an opportunity to ask questions and all were answered. The patient agreed with the plan and demonstrated an understanding of the instructions.   The patient was advised to call back or seek an in-person evaluation if the symptoms worsen or if the condition fails to improve as anticipated.  I provided 20 minutes of non face-to-face telephone visit time during this encounter.  The time was dedicated to reviewing imaging studies, pathology results and reviewing treatment options.  Zola Button, MD 09/16/2021 10:25 AM

## 2021-09-16 NOTE — Telephone Encounter (Signed)
Scheduled appt per 3/3 staff msg from MD. Called pt using both numbers provided in the chart. No answer and either number. I left a message on both numbers with appointment date/time and requested for pt to call me back to confirm appt. I did also let pt know that this will be a phone visit per MD instructions, so pt will not need to come to the office.  ?

## 2021-09-19 ENCOUNTER — Inpatient Hospital Stay: Payer: 59

## 2021-09-19 ENCOUNTER — Other Ambulatory Visit: Payer: Self-pay | Admitting: Radiation Therapy

## 2021-09-19 DIAGNOSIS — C7951 Secondary malignant neoplasm of bone: Secondary | ICD-10-CM

## 2021-09-19 NOTE — Progress Notes (Incomplete)
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   Tobacco/Marijuana/Snuff/ETOH use: No to all substances.  Past/Anticipated interventions by cardiothoracic surgery, if any:   Past/Anticipated interventions by medical oncology, if any:  Dr. Alen Blew 1.  Kidney cancer diagnosed in 2017.  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  She developed stage IV disease documented metastatic lesion to the T6 vertebral body.  He is status post biopsy but no definitive treatment.   Her case was discussed today in detail with Dr. Christella Noa regarding further surgical intervention including removing any residual tumor and possible vertebroplasty.  Alternative options such as radiation therapy could also be considered but I feel would be preferable postoperatively at that time.   Before proceeding with any additional surgical intervention, I have recommended obtaining PET imaging to rule out any metastatic disease.  If no evidence of metastasis noted that now local therapy to her T6 lesion followed by potentially adjuvant immunotherapy could be considered   If she has widespread metastatic disease noted then systemic therapy would be considered.    This was discussed with the patient today and she is agreeable with this plan.   2.  Follow-up: will be in the near future after she completes her imaging studies, surgery and possible    Signs/Symptoms Weight changes, if any: {:18581} Respiratory complaints, if any: {:18581} Hemoptysis, if any: {:18581} Pain issues, if any:  {:18581}  SAFETY ISSUES: Prior radiation? {:18581} Pacemaker/ICD? {:18581}  Possible current pregnancy? No Is the  patient on methotrexate? No  Current Complaints / other details:

## 2021-09-22 ENCOUNTER — Telehealth: Payer: Self-pay | Admitting: Oncology

## 2021-09-22 NOTE — Telephone Encounter (Signed)
Scheduled per 03/03 los, patient has been called and notified.  

## 2021-09-26 ENCOUNTER — Other Ambulatory Visit: Payer: Self-pay

## 2021-09-26 ENCOUNTER — Ambulatory Visit
Admission: RE | Admit: 2021-09-26 | Discharge: 2021-09-26 | Disposition: A | Discharge status: Home / Self Care | Payer: 59 | Source: Ambulatory Visit | Attending: Radiation Oncology | Admitting: Radiation Oncology

## 2021-09-26 DIAGNOSIS — C7951 Secondary malignant neoplasm of bone: Secondary | ICD-10-CM

## 2021-09-26 IMAGING — MR MR THORACIC SPINE WO/W CM
4 of 7 series · 30 of 48 positions shown · IV contrast (15 ml multihance)
Comparison: MRI of the thoracic spine [DATE].

CLINICAL DATA: Spine metastasis (HCC) [SQ] ([SQ]-CM)Metastatic
disease evaluation; [REDACTED] Treatment planning scan.

EXAM:
MRI THORACIC WITHOUT AND WITH CONTRAST
TECHNIQUE: Multiplanar and multiecho pulse sequences of the thoracic spine were
obtained without and with intravenous contrast.
CONTRAST:  15mL MULTIHANCE GADOBENATE DIMEGLUMINE 529 MG/ML IV SOLN

[Series 17: T1 · sagittal · 3.0mm · 1.00mm/px · 4 of 15 slices shown (1 of 2)]
[im 1/15]
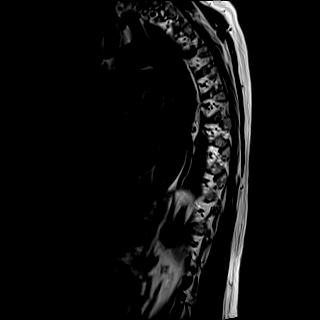
[im 5/15]
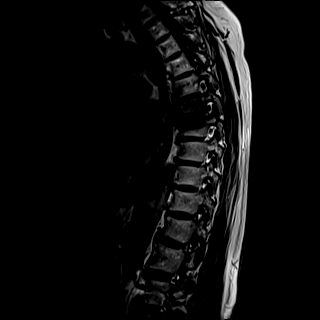
[im 10/15]
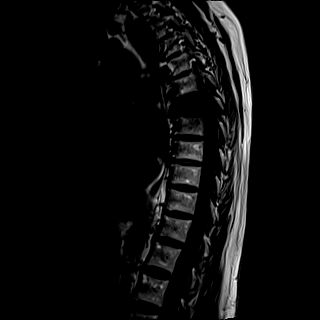
[im 15/15]
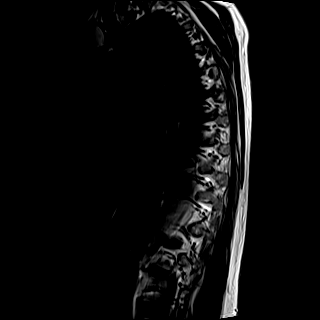

[Series 19: T1 · axial · non-contrast · 2.0mm · 0.39mm/px · z∈[-155,-73]mm · 11 of 42 slices shown (2 of 2)]
[im 1/42]
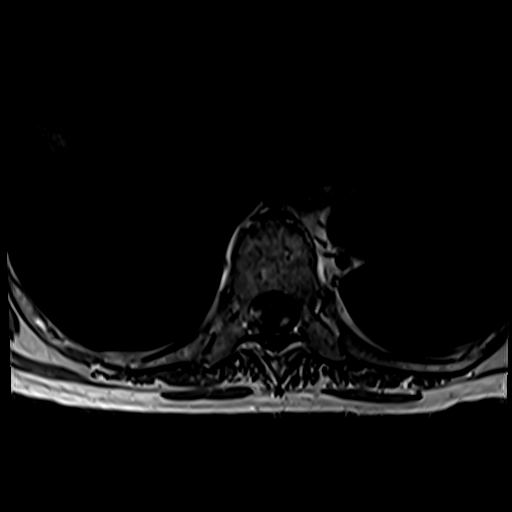
[im 5/42]
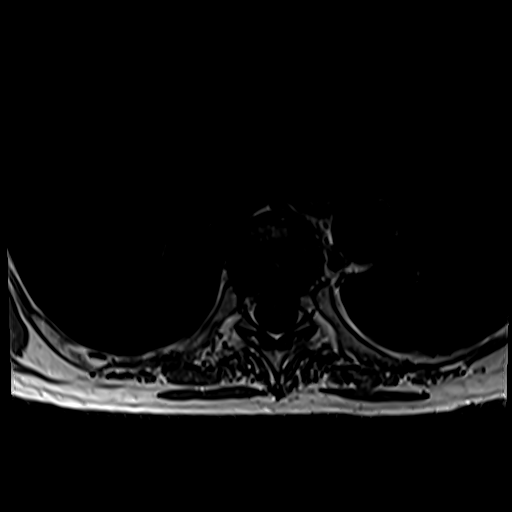
[im 9/42]
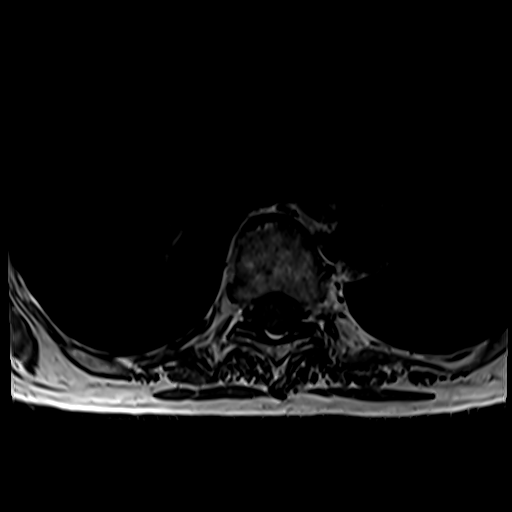
[im 13/42]
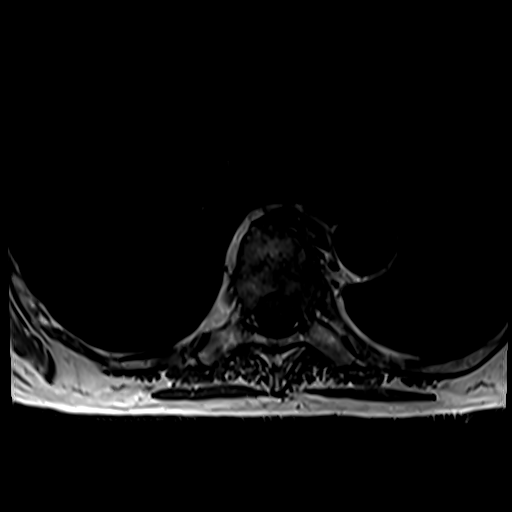
[im 17/42]
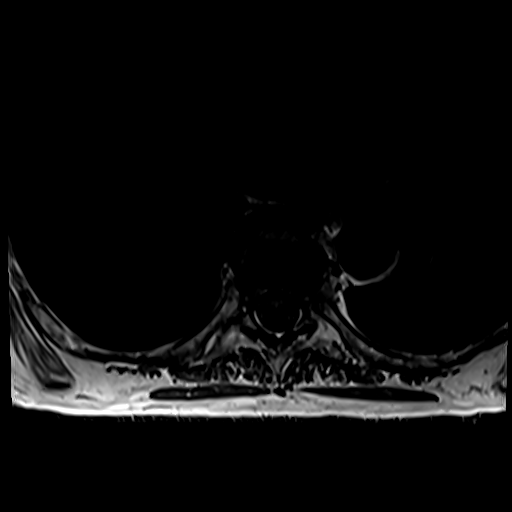
[im 21/42]
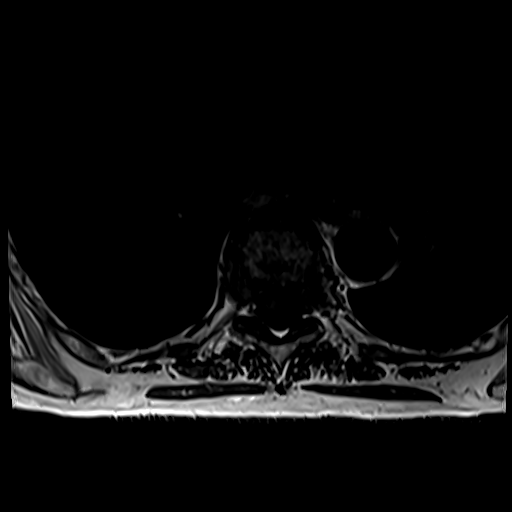
[im 25/42]
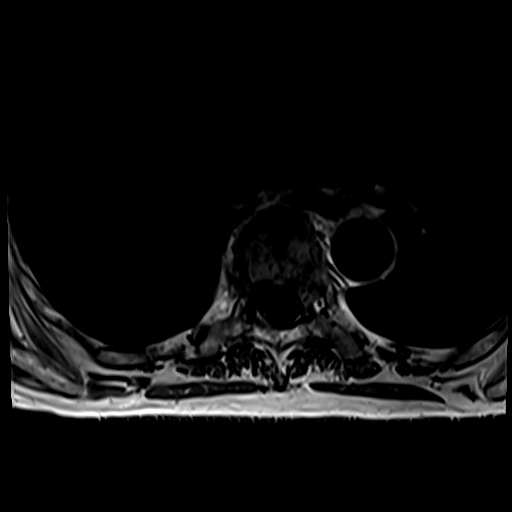
[im 29/42]
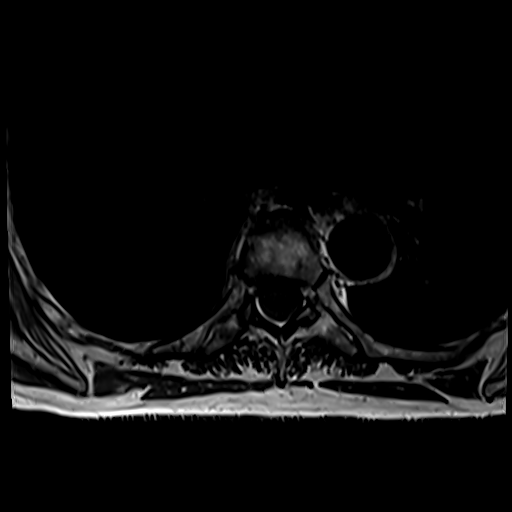
[im 33/42]
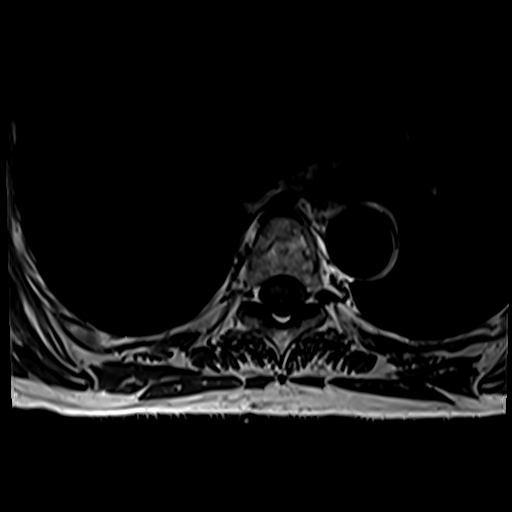
[im 37/42]
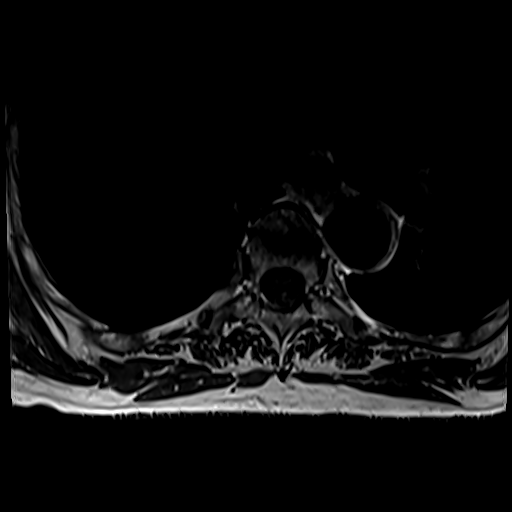
[im 42/42]
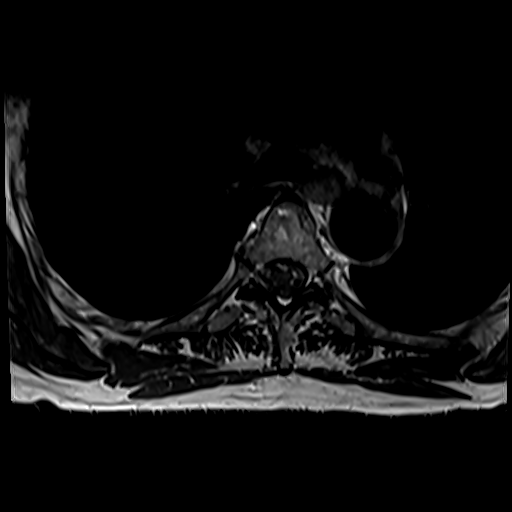

[Series 20: T2 · axial · 2.0mm · 0.62mm/px · z∈[-155,-73]mm · 11 of 42 slices shown (1 of 2)]
[im 1/42]
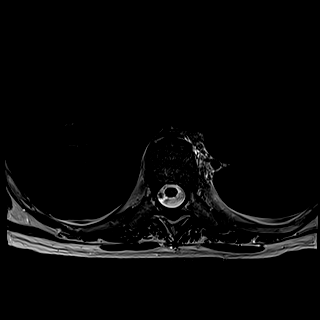
[im 5/42]
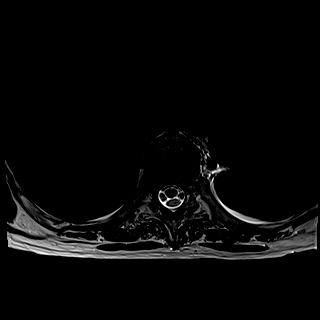
[im 9/42]
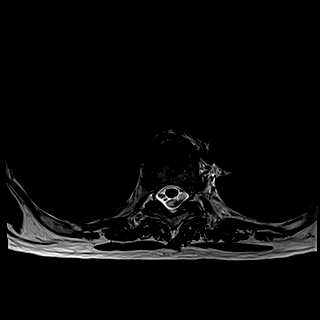
[im 13/42]
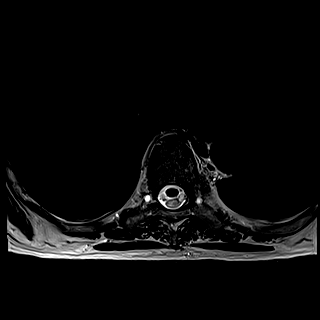
[im 17/42]
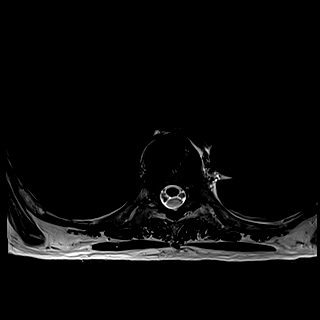
[im 21/42]
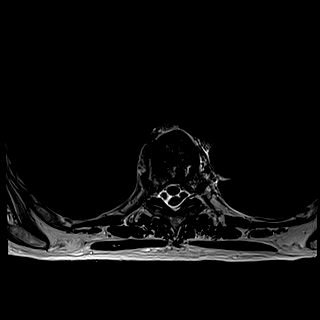
[im 25/42]
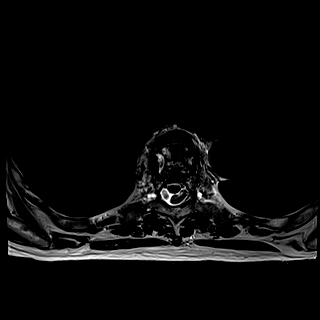
[im 29/42]
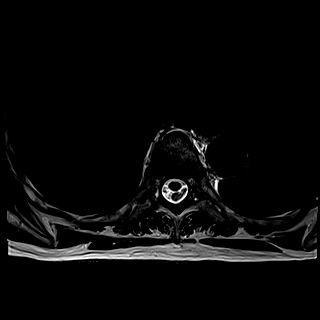
[im 33/42]
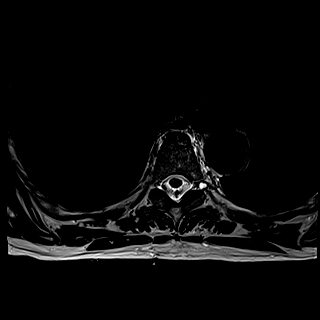
[im 37/42]
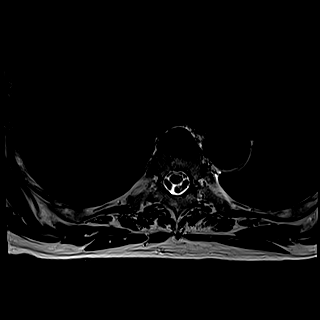
[im 42/42]
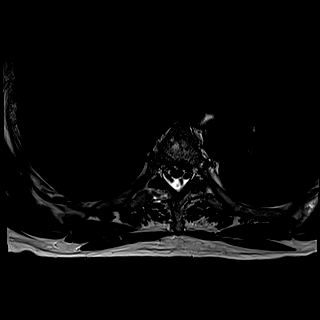

[Series 21: T2 · sagittal · 3.0mm · 1.00mm/px · 4 of 15 slices shown (2 of 2)]
[im 1/15]
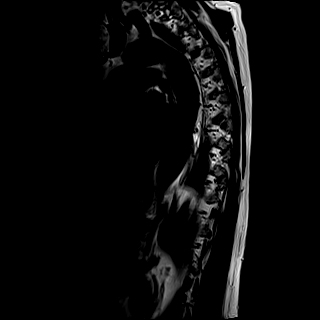
[im 5/15]
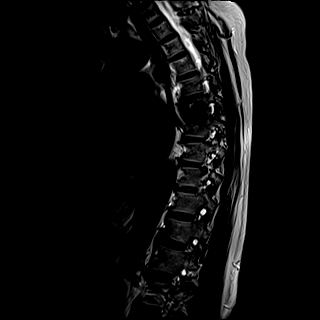
[im 10/15]
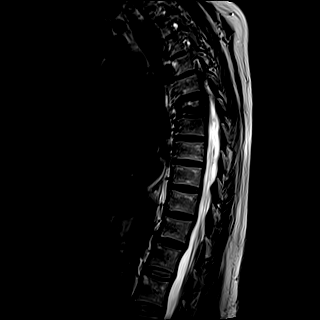
[im 15/15]
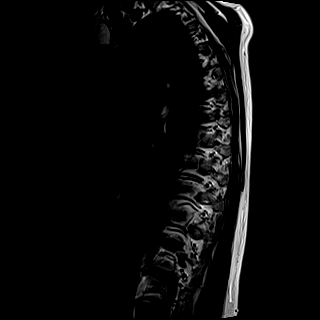

[30 of 48 positions shown; findings below may reference images not displayed]

FINDINGS: Alignment:  Physiologic.

Vertebrae: Interval progression of T6 vertebral body fracture now
with approximately 40-45% loss of vertebral body height with minimal
retropulsion. Associated marrow edema extends to the right pedicle.
Unchanged appearance of mild soft tissue edema and contrast enhanced
in the paravertebral soft tissue.

Interval development of compression fracture of the T7 vertebral
body with minimal height loss in associated marrow edema.

No other compression fracture or suspicious bone lesion identified.
No evidence of discitis.

Cord:  Normal signal and morphology.

Paraspinal and other soft tissues: Mild paravertebral soft tissue
edema at T6, as described above.

Disc levels:

Small posterior disc protrusion at T2-3, T4-5 and T11-12 causing
minimal indentation on the thecal sac.

Mild facet degenerative changes in the lower thoracic spine. No
significant spinal canal or neural foraminal stenosis at any level.
IMPRESSION: 1. Interval of pathologic fracture of the T6 vertebral body now with
40-45% loss of vertebral body height. Retropulsion remains minimal.
2. New compression fracture of the T7 vertebral body with minimal
height loss.

## 2021-09-26 MED ORDER — GADOBENATE DIMEGLUMINE 529 MG/ML IV SOLN
15.0000 mL | Freq: Once | INTRAVENOUS | Status: AC | PRN
  Administered 2021-09-26: 15 mL via INTRAVENOUS

## 2021-09-27 ENCOUNTER — Telehealth: Payer: Self-pay | Admitting: Radiation Therapy

## 2021-09-27 ENCOUNTER — Ambulatory Visit
Admission: RE | Admit: 2021-09-27 | Discharge: 2021-09-27 | Disposition: A | Discharge status: Home / Self Care | Payer: 59 | Source: Ambulatory Visit | Attending: Radiation Oncology | Admitting: Radiation Oncology

## 2021-09-27 ENCOUNTER — Encounter: Payer: Self-pay | Admitting: Radiation Oncology

## 2021-09-27 VITALS — BP 119/75 | HR 72 | Temp 98.2°F | Resp 18 | Ht 67.0 in | Wt 167.0 lb

## 2021-09-27 DIAGNOSIS — C7951 Secondary malignant neoplasm of bone: Secondary | ICD-10-CM

## 2021-09-27 DIAGNOSIS — C649 Malignant neoplasm of unspecified kidney, except renal pelvis: Secondary | ICD-10-CM | POA: Insufficient documentation

## 2021-09-27 DIAGNOSIS — C641 Malignant neoplasm of right kidney, except renal pelvis: Secondary | ICD-10-CM | POA: Insufficient documentation

## 2021-09-27 DIAGNOSIS — Z510 Encounter for antineoplastic radiation therapy: Secondary | ICD-10-CM | POA: Diagnosis present

## 2021-09-27 MED ORDER — METHYLPREDNISOLONE 4 MG PO TBPK: ORAL_TABLET | ORAL | 0 refills | Status: DC

## 2021-09-27 NOTE — Telephone Encounter (Signed)
Spoke with Ms. Mittelstadt about the Medrol Dosepak prescribed by Dr. Tammi Klippel. She understands to start taking this on Monday 3/20, the day before her radiation treatment, for pain flare prevention. She was very happy for the call and plans to pick up this medication later today.  ? ?Mont Dutton R.T.(R)(T) ?Radiation Special Procedures Navigator  ?

## 2021-09-27 NOTE — Progress Notes (Signed)
?Radiation Oncology         [phone] ?________________________________ ? ?Initial outpatient Consultation ? ?Name: Dana Hanson MRN: [id number]  ?Date of Service: 09/27/2021 DOB: 1961-01-19 ? ?GU:YQIHK, [name], MD  Wyatt Portela, MD  ? ?REFERRING PHYSICIAN: Wyatt Portela, MD ? 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 ?  ICD-10-CM   ?1. Secondary malignant neoplasm of bone and bone marrow (HCC)  C79.51   ? C79.52   ?  ? ? ?HISTORY OF PRESENT ILLNESS: Dana Hanson is a 61 y.o. female seen at the request of Dr. Alen Blew. She was initially diagnosed with renal cell carcinoma in 2017. In summary, during work up for gallstone pancreatitis, she was incidentally found to have a 6.5 cm right renal mass. Dr. Louis Meckel performed a laparoscopic radical right nephrectomy on 09/03/15. Pathology revealed a 6.5 cm grade 4 clear cell renal cell carcinoma, confined to the kidney with no invasion or positive margins. She had been on surveillance since that time without evidence of recurrence or progression on follow up scans. ? ?In January 2023, she developed mid back pain. Initial thoracic spine x-ray at urgent care on 08/07/21 showed only mild to moderate multilevel disc space height loss and osteophytosis. She was prescribed zanaflex and prednisone with no relief. She then presented to the ED on 08/20/21 with continued, progressive mid back pain, worse with taking a deep breath and any type of movement. Chest CT angio was performed to rule out pulmonary embolism and showed an acute-appearing compression fracture of T6 vertebra with sclerosis, suspicious for pathologic fracture. ? ?She was seen by Dr. Amedeo Kinsman in orthopedics and then by Dr. Christella Noa in neurosurgery. Thoracic spine MRI performed on 09/04/21 showed a moderate T6 compression fracture with 35-40% height loss, indeterminate, with suspicious decreased T1 signal and enhancement throughout vertebral body but no epidural or  extraosseous extension and no other suspicious bony lesions in the visible spine or ribs. She was taken for bone biopsy of the T6 vertebral body on 09/09/21 under the care of Dr. Christella Noa. Final surgical pathology confirmed clear-cell carcinoma consistent with metastatic renal cell carcinoma. ? ?She was referred to Dr. Alen Blew on 09/16/21, who recommended PET scan before considering surgery. The PET scan is scheduled for 09/29/21. Her case was discussed in the multidisciplinary brain and spine conference on 09/26/21 and consensus recommendation was to proceed with SRS to the bony lesion at T6. She did undergo a treatment planning thoracic spine MRI on 09/26/21 which showed progression of the pathologic fracture of T6, now with 40-45% height loss, and new compression fracture of T7.  She has kindly been referred today for discussion of radiation treatment options. ? ?PREVIOUS RADIATION THERAPY: No ? ?PAST MEDICAL HISTORY:  ?Past Medical History:  ?Diagnosis Date  ? Cancer [facility])   ? kidney  ? Gallstone pancreatitis   ? Hyperlipidemia   ? IBS (irritable bowel syndrome)   ? Right renal mass   ?   ? ?PAST SURGICAL HISTORY: ?Past Surgical History:  ?Procedure Laterality Date  ? CESAREAN SECTION    ? x 2-tubal with last c-section  ? CHOLECYSTECTOMY N/A 09/03/2015  ? Procedure: LAPAROSCOPIC CHOLECYSTECTOMY WITH INTRAOPERATIVE CHOLANGIOGRAM;  Surgeon: Mickeal Skinner, MD;  Location: [facility];  Service: General;  Laterality: N/A;  ? COLONOSCOPY  08/14/2011  ? Procedure: COLONOSCOPY;  Surgeon: Daneil Dolin, MD;  Location: [facility];  Service: Endoscopy;  Laterality: N/A;  10:00  ? HEMORRHOID SURGERY  11/10/2011  ? Procedure: HEMORRHOIDECTOMY;  Surgeon: Donato Heinz, MD;  Location: [facility];  Service: General;  Laterality: N/A;  ? LAPAROSCOPIC GASTRIC SLEEVE RESECTION  01-2015  ? LAPAROSCOPIC NEPHRECTOMY N/A 09/03/2015  ? Procedure: LAPAROSCOPIC RADICAL NEPHRECTOMY;  Surgeon: Ardis Hughs, MD;  Location: [facility];  Service:  Urology;  Laterality: N/A;  ? TUBAL LIGATION    ? with last c-section  ? VERTEBROPLASTY N/A 09/09/2021  ? Procedure: Thoracic vertebral biopsy;  Surgeon: Ashok Pall, MD;  Location: [place];  Service: Neurosurgery;  Laterality: N/A;  RM 21  ? ? ?FAMILY HISTORY:  ?Family History  ?Adopted: Yes  ?Problem Relation Age of Onset  ? Colon cancer Neg Hx   ? Liver disease Neg Hx   ? Anesthesia problems Neg Hx   ? Hypotension Neg Hx   ? Malignant hyperthermia Neg Hx   ? Pseudochol deficiency Neg Hx   ? ? ?SOCIAL HISTORY:  ?Social History  ? ?Socioeconomic History  ? Marital status: Divorced  ?  Spouse name: Not on file  ? Number of children: 2  ? Years of education: Not on file  ? Highest education level: Bachelor's degree (e.g., BA, AB, BS)  ?Occupational History  ?  Employer: [employer]  ?Tobacco Use  ? Smoking status: Never  ? Smokeless tobacco: Never  ?Vaping Use  ? Vaping Use: Never used  ?Substance and Sexual Activity  ? Alcohol use: No  ? Drug use: No  ? Sexual activity: Yes  ?  Birth control/protection: Surgical  ?Other Topics Concern  ? Not on file  ?Social History Narrative  ? Lives alone  ? ?Social Determinants of Health  ? ?Financial Resource Strain: Not on file  ?Food Insecurity: Not on file  ?Transportation Needs: Not on file  ?Physical Activity: Not on file  ?Stress: Not on file  ?Social Connections: Not on file  ?Intimate Partner Violence: Not on file  ? ? ?ALLERGIES: Patient has no known allergies. ? ?MEDICATIONS:  ?Current Outpatient Medications  ?Medication Sig Dispense Refill  ? acetaminophen (TYLENOL) 325 MG tablet Take 2 tablets (650 mg total) by mouth every 6 (six) hours as needed for moderate pain. 30 tablet 0  ? Multiple Vitamin (MULITIVITAMIN WITH MINERALS) TABS Take 1 tablet by mouth daily.    ? pregabalin (LYRICA) 50 MG capsule Take 50 mg by mouth 3 (three) times daily.    ? HYDROcodone-acetaminophen (NORCO/VICODIN) 5-325 MG tablet Take 1 tablet by mouth every 6 (six) hours as needed for moderate  pain. (Patient not taking: Reported on 09/27/2021) 30 tablet 0  ? ?No current facility-administered medications for this encounter.  ? ? ?REVIEW OF SYSTEMS:  On review of systems, the patient reports that she is doing well overall. She denies any chest pain, shortness of breath, cough, fevers, chills, night sweats, or unintended weight changes. She denies any bowel or bladder disturbances, and denies abdominal pain, nausea or vomiting. She reports that the mid back pain has improved since the time of biopsy and currently rated as 2/10 today. She denies any focal weakness or paraesthesias in the upper or lower extremities and no other focal sites of pain. A complete review of systems is obtained and is otherwise negative. ? ?  ?PHYSICAL EXAM:  ?Wt Readings from Last 3 Encounters:  ?09/27/21 167 lb (75.8 kg)  ?09/09/21 162 lb (73.5 kg)  ?08/23/21 166 lb (75.3 kg)  ? ?Temp Readings from Last 3 Encounters:  ?09/27/21 98.2 ?F (36.8 ?C) (Oral)  ?09/09/21 Marland Kitchen)  97.5 ?F (36.4 ?C)  ?08/20/21 98.3 ?F (36.8 ?C) (Oral)  ? ?BP Readings from Last 3 Encounters:  ?09/27/21 119/75  ?09/09/21 138/83  ?08/23/21 (!) 148/92  ? ?Pulse Readings from Last 3 Encounters:  ?09/27/21 72  ?09/09/21 88  ?08/23/21 (!) 59  ? ?Pain Assessment ?Pain Score: 2  ?Pain Loc: Back (middle)/10 ? ?In general this is a well appearing Caucasian female in no acute distress. She's alert and oriented x4 and appropriate throughout the examination. Cardiopulmonary assessment is negative for acute distress and she exhibits normal effort.  ? ?KPS = 90 ? ?100 - Normal; no complaints; no evidence of disease. ?90   - Able to carry on normal activity; minor signs or symptoms of disease. ?80   - Normal activity with effort; some signs or symptoms of disease. ?53   - Cares for self; unable to carry on normal activity or to do active work. ?60   - Requires occasional assistance, but is able to care for most of his personal needs. ?50   - Requires considerable assistance and  frequent medical care. ?81   - Disabled; requires special care and assistance. ?30   - Severely disabled; hospital admission is indicated although death not imminent. ?20   - Very sick; hospital admission n

## 2021-09-27 NOTE — Progress Notes (Signed)
?  Radiation Oncology         [phone] ?________________________________ ? ?Name: Dana Hanson MRN: [id number]  ?Date: 09/27/2021  DOB: [birth year] ? ?SPINAL STEREOTACTIC RADIOSURGERY ? ?SIMULATION AND TREATMENT PLANNING NOTE ? ?  ICD-10-CM   ?1. Spine metastasis ([place])  C79.51   ?  ?2. Cancer of right kidney (HCC)  C64.1   ?  ? ? 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 ?NARRATIVE:  The patient was brought to the [facility].  Identity was confirmed.  All relevant records and images related to the planned course of therapy were reviewed.  The patient freely provided informed written consent to proceed with treatment after reviewing the details related to the planned course of therapy. The consent form was witnessed and verified by the simulation staff.  Then, the patient was set-up in a stable reproducible  supine position for radiation therapy.  A BodyFix immobilization pillow was fabricated for reproducible positioning.  Surface markings were placed.  The CT images were loaded into the planning software.  The gross target volumes (GTV) and planning target volumes (PTV) were delinieated, and avoidance structures were contoured.  Treatment planning then occurred.  The radiation prescription was entered and confirmed.  A total of two complex treatment devices were fabricated in the form of the BodyFix immobilization pillow and a neck accuform cushion.  I have requested : 3D Simulation  I have requested a DVH of the following structures: targets and all normal structures near the target including heart, both lungs, spinal cord, skin, esophagus and others as noted on the radiation plan to maintain doses in adherence with established limits ? ?SPECIAL TREATMENT PROCEDURE:  The planned course of therapy using radiation constitutes a special treatment procedure. Special care is required in the management of this patient for the following reasons.  High dose per fraction requiring special monitoring for increased toxicities of treatment including daily imaging..  The special nature of the planned course of radiotherapy will require increased physician supervision and oversight to ensure patient's safety with optimal treatment outcomes.    This requires extended time and effort.   ? ?PLAN:  The patient will receive 18 Gy in 1 fraction. ? ?________________________________ ? ?Sheral Apley Tammi Klippel, M.D. ? ?

## 2021-09-29 ENCOUNTER — Ambulatory Visit (HOSPITAL_COMMUNITY)
Admission: RE | Admit: 2021-09-29 | Discharge: 2021-09-29 | Disposition: A | Discharge status: Home / Self Care | Payer: 59 | Source: Ambulatory Visit | Attending: Oncology | Admitting: Oncology

## 2021-09-29 ENCOUNTER — Other Ambulatory Visit: Payer: Self-pay

## 2021-09-29 DIAGNOSIS — Z510 Encounter for antineoplastic radiation therapy: Secondary | ICD-10-CM | POA: Diagnosis not present

## 2021-09-29 DIAGNOSIS — C641 Malignant neoplasm of right kidney, except renal pelvis: Secondary | ICD-10-CM | POA: Diagnosis present

## 2021-09-29 IMAGING — CT NM PET TUM IMG INITIAL (PI) SKULL BASE T - THIGH
1 of 8 series · 1 of 25 positions shown · non-contrast
Comparison: Thoracic spine MRI [DATE]. Chest CTA [DATE] and
abdominal CT [DATE].

CLINICAL DATA: Subsequent treatment strategy for right kidney
cancer diagnosed in [3P] with metastatic disease to the spine.

EXAM:
NUCLEAR MEDICINE PET SKULL BASE TO THIGH
TECHNIQUE: 8.47 mCi F-18 FDG was injected intravenously. Full-ring PET imaging
was performed from the skull base to thigh after the radiotracer. CT
data was obtained and used for attenuation correction and anatomic
localization.
Fasting blood glucose: 90 mg/dl

[Series 3: ctac · axial · 3.0mm · 0.98mm/px · 1 of 293 slices shown]
[im 293/293  brain]
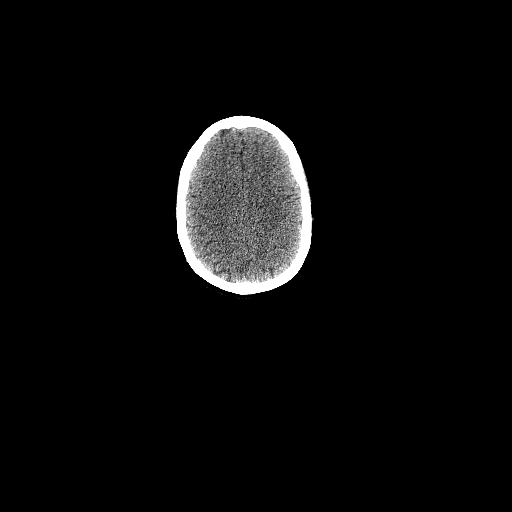

[1 of 25 positions shown; findings below may reference images not displayed]

FINDINGS: Mediastinal blood pool activity: SUV max

NECK:

No hypermetabolic cervical lymph nodes are identified.There are no
lesions of the pharyngeal mucosal space. Mild activity within the
lymphoid tissue of Waldeyer's ring is symmetric and within
physiologic limits.

Incidental CT findings: Mildly heterogeneous activity within the
thyroid gland without focal hypermetabolic lesions. There are small
calcifications and nodules within the gland which measure up to
cm on the left (image 67/3). By consensus guidelines, no specific
follow-up necessary.

CHEST:

There are no hypermetabolic mediastinal, hilar or axillary lymph
nodes. No hypermetabolic pulmonary activity or suspicious
nodularity.

Incidental CT findings: Coronary and aortic atherosclerosis.

ABDOMEN/PELVIS:

There is no hypermetabolic activity within the liver, adrenal
glands, spleen or pancreas. There is no hypermetabolic nodal
activity. Scattered bowel activity is noted, likely physiologic.
There is prominent focal activity in the ascending colon near the
hepatic flexure (SUV max 6.6) without definite corresponding
abnormality on the CT images. No abnormal metabolic activity within
the right nephrectomy bed.

Incidental CT findings: Status post right nephrectomy. No
abnormality of the left kidney demonstrated. Additional postsurgical
changes consistent with cholecystectomy and gastric bypass. Moderate
stool throughout the colon. There are diverticular changes in the
distal colon.

SKELETON:

There is mild focal hypermetabolic activity within the known
pathologic fracture at T6 (SUV max 4.8). There is also mild
hypermetabolic activity within the adjacent superior endplate of T7,
corresponding with a fracture on MRI. There is also focal
hypermetabolic activity associated with the anterolateral aspect of
the right 9th rib (SUV max 5.2). This corresponds with sclerosis on
CT image 159/3, favored to reflect a healing rib fracture. No
associated soft tissue mass.

Incidental CT findings: Lumbar spondylosis.
IMPRESSION: 1. The known pathologic fracture at T6 is mildly hypermetabolic.
Adjacent superior endplate compression fracture seen on MRI
demonstrates similar mild hypermetabolic activity.
2. No other findings highly suspicious for metastatic disease. There
is focal hypermetabolic activity anterolaterally in the right 9th
rib which is probably due to a healing fracture.
3. Focal hypermetabolic activity in the right colon near the hepatic
flexure without clear corresponding abnormality on the CT images.
This could be physiologic. However, if the patient is not up-to-date
on colon cancer screening, appropriate screening should be
considered.
4. Previous right nephrectomy, cholecystectomy and gastric bypass.

## 2021-09-29 MED ORDER — FLUDEOXYGLUCOSE F - 18 (FDG) INJECTION
8.4700 | Freq: Once | INTRAVENOUS | Status: AC | PRN
  Administered 2021-09-29: 8.47 via INTRAVENOUS

## 2021-10-04 ENCOUNTER — Encounter: Payer: Self-pay | Admitting: Radiation Oncology

## 2021-10-04 ENCOUNTER — Ambulatory Visit
Admission: RE | Admit: 2021-10-04 | Discharge: 2021-10-04 | Disposition: A | Discharge status: Home / Self Care | Payer: 59 | Source: Ambulatory Visit | Attending: Radiation Oncology | Admitting: Radiation Oncology

## 2021-10-04 ENCOUNTER — Other Ambulatory Visit: Payer: Self-pay

## 2021-10-04 VITALS — BP 124/70 | HR 69 | Temp 97.7°F | Resp 18

## 2021-10-04 DIAGNOSIS — Z510 Encounter for antineoplastic radiation therapy: Secondary | ICD-10-CM | POA: Diagnosis not present

## 2021-10-04 DIAGNOSIS — C7951 Secondary malignant neoplasm of bone: Secondary | ICD-10-CM

## 2021-10-04 NOTE — Progress Notes (Signed)
Patient in for [facility] T- spine for 30 minute observation.  Denies pain at this time request to talk with Ashlyn or Dr. Tammi Klippel.  Both made aware and Dr. Tammi Klippel saw patient to review PET Scan results. ?

## 2021-10-06 NOTE — Progress Notes (Signed)
?  Radiation Oncology         [phone] ?________________________________ ? ?Spinal Stereotactic Radiosurgery Procedure Note ? ?Name: Dana Hanson MRN: [id number]  ?Date: 10/04/2021  DOB: [birth year] ? ?SPECIAL TREATMENT PROCEDURE ? ?  ICD-10-CM   ?1. Spine metastasis ([place])  C79.51   ?  ? ? ?3D TREATMENT PLANNING AND DOSIMETRY:  The patient's radiation plan was reviewed and approved by neurosurgery and radiation oncology prior to treatment.  It showed 3-dimensional radiation distributions overlaid onto the planning CT/MRI image set.  The [facility] for the target structures as well as the organs at risk were reviewed. The documentation of the 3D plan and dosimetry are filed in the radiation oncology EMR. ? ?NARRATIVE:  Dana Hanson was brought to the TrueBeam stereotactic radiation treatment machine and placed supine on the CT couch. The patient was precisely re-positioned in their BodyFix immobilization device, and the patient was set up for stereotactic radiosurgery.  Neurosurgery was present for the set-up and delivery ? ?SIMULATION VERIFICATION:  In the couch zero-angle position, the patient underwent Exactrac imaging using the Brainlab system with orthogonal KV images to position the target accounting for translation and rotational factors.  These were carefully aligned and repeated to confirm treatment position.  Then, cone beam CT was performed to help further verify placement and make any final translational shifts. ? ?SPECIAL TREATMENT PROCEDURE: Garlan Fillers received stereotactic radiosurgery to the following targets: ? ?The targeted metastasis in the T6 vertebral body was treated using 3 Rapid Arc VMAT Beams to a prescription dose of 18 Gy to the gross disease (GTV) seen on imaging, while a secondary lower prescription dose of 15 Gy was delivered to the entirety of each directly involved marrow compartment of the gross disease (CTV).    The beams were delivered with 6 MV X-rays in the flattening  filter free mode. ? ?STEREOTACTIC TREATMENT MANAGEMENT:  Following delivery, the patient was transported to nursing in stable condition and monitored for possible acute effects.  Vital signs were recorded BP 124/70 (BP Location: Right Arm, Patient Position: Sitting, Cuff Size: Normal)   Pulse 69   Temp 97.7 ?F (36.5 ?C)   Resp 18   LMP 10/27/2013   SpO2 100% . The patient tolerated treatment without significant acute effects, and was discharged to home in stable condition.   ? ?PLAN: Follow-up in one month.  Patient given Medrol Dosepak started yesterday to reduce risk for pain flare ? ?________________________________ ? ?Sheral Apley Tammi Klippel, M.D. ? ?

## 2021-10-10 ENCOUNTER — Inpatient Hospital Stay (HOSPITAL_BASED_OUTPATIENT_CLINIC_OR_DEPARTMENT_OTHER): Payer: 59 | Admitting: Oncology

## 2021-10-10 ENCOUNTER — Other Ambulatory Visit: Payer: Self-pay

## 2021-10-10 VITALS — BP 114/70 | HR 72 | Temp 97.8°F | Resp 17 | Ht 67.0 in | Wt 166.8 lb

## 2021-10-10 DIAGNOSIS — C649 Malignant neoplasm of unspecified kidney, except renal pelvis: Secondary | ICD-10-CM | POA: Diagnosis present

## 2021-10-10 DIAGNOSIS — C7951 Secondary malignant neoplasm of bone: Secondary | ICD-10-CM | POA: Diagnosis not present

## 2021-10-10 DIAGNOSIS — Z905 Acquired absence of kidney: Secondary | ICD-10-CM | POA: Diagnosis not present

## 2021-10-10 DIAGNOSIS — C641 Malignant neoplasm of right kidney, except renal pelvis: Secondary | ICD-10-CM | POA: Diagnosis not present

## 2021-10-10 NOTE — Progress Notes (Signed)
Hematology and Oncology Follow Up Visit ? ?Dana Hanson ?[id number] ?01-23-1961 61 y.o. ?10/10/2021 9:15 AM ?Dana Hanson, MDFagan, Dana Manner, MD  ? ?Principle Diagnosis: 66 year old woman with kidney cancer diagnosed in 2017.  She initially presented with stage Ib clear-cell renal cell carcinoma and developed stage IV disease including T6 vertebral body metastasis in January 2023. ? ? ?Prior Therapy: ? ?She is status post laparoscopic right radical nephrectomy performed in February 2017.  She was found to have 6.5 cm clear-cell renal cell carcinoma. ? ?She is status post surgical biopsy completed by Dr. Christella Hanson on September 09, 2021 of the T6 spinal lesion which confirmed the presence of metastatic renal cell carcinoma. ? ?She is status post spinal stereotactic radiosurgery completed on September 27, 2021 for isolated metastatic lesion of the T6 spine. ? ? ?Current therapy: Under evaluation for additional therapy. ? ?Interim History: Dana Hanson returns today for a follow-up visit.  Since the last visit, she completed spinal stereotactic radiosurgery for metastatic T6 without any major complications.  She reports no nausea vomiting or abdominal pain.  She denies any back pain or neurological deficits.  She is quite ambulatory and attends activities of daily living.  She does not require any pain medication at this time. ? ? ? ?Medications: I have reviewed the patient's current medications.  ?Current Outpatient Medications  ?Medication Sig Dispense Refill  ? acetaminophen (TYLENOL) 325 MG tablet Take 2 tablets (650 mg total) by mouth every 6 (six) hours as needed for moderate pain. 30 tablet 0  ? HYDROcodone-acetaminophen (NORCO/VICODIN) 5-325 MG tablet Take 1 tablet by mouth every 6 (six) hours as needed for moderate pain. (Patient not taking: Reported on 09/27/2021) 30 tablet 0  ? methylPREDNISolone (MEDROL DOSEPAK) 4 MG TBPK tablet Use as directed on pack - Start 24 hours before spine radiation 21 tablet 0  ? Multiple Vitamin  (MULITIVITAMIN WITH MINERALS) TABS Take 1 tablet by mouth daily.    ? pregabalin (LYRICA) 50 MG capsule Take 50 mg by mouth 3 (three) times daily.    ? ?No current facility-administered medications for this visit.  ? ? ? ?Allergies: No Known Allergies ? ? ? ?Physical Exam: ?Blood pressure 114/70, pulse 72, temperature 97.8 ?F (36.6 ?C), temperature source Temporal, resp. rate 17, height '5\' 7"'$  (1.702 m), weight 166 lb 12.8 oz (75.7 kg), last menstrual period 10/27/2013, SpO2 99 %. ? ?ECOG:  ? ? ? ?General appearance: Comfortable appearing without any discomfort ?Head: Normocephalic without any trauma ?Oropharynx: Mucous membranes are moist and pink without any thrush or ulcers. ?Eyes: Pupils are equal and round reactive to light. ?Lymph nodes: No cervical, supraclavicular, inguinal or axillary lymphadenopathy.   ?Heart:regular rate and rhythm.  S1 and S2 without leg edema. ?Lung: Clear without any rhonchi or wheezes.  No dullness to percussion. ?Abdomin: Soft, nontender, nondistended with good bowel sounds.  No hepatosplenomegaly. ?Musculoskeletal: No joint deformity or effusion.  Full range of motion noted. ?Neurological: No deficits noted on motor, sensory and deep tendon reflex exam. ?Skin: No petechial rash or dryness.  Appeared moist.  ? ? ? ? ?Lab Results: ?Lab Results  ?Component Value Date  ? WBC 5.2 08/20/2021  ? HGB 12.9 08/20/2021  ? HCT 40.5 08/20/2021  ? MCV 90.0 08/20/2021  ? PLT 250 08/20/2021  ? ?  Chemistry   ?   ?Component Value Date/Time  ? NA 139 08/20/2021 2023  ? K 3.8 08/20/2021 2023  ? CL 106 08/20/2021 2023  ? CO2 28 08/20/2021 2023  ?  BUN 24 (H) 08/20/2021 2023  ? CREATININE 1.11 (H) 08/20/2021 2023  ?    ?Component Value Date/Time  ? CALCIUM 9.3 08/20/2021 2023  ? ALKPHOS 50 09/03/2015 0625  ? AST 17 09/03/2015 0625  ? ALT 14 09/03/2015 0625  ? BILITOT 1.1 09/03/2015 0625  ?  ? ?IMPRESSION: ?1. The known pathologic fracture at T6 is mildly hypermetabolic. ?Adjacent superior endplate  compression fracture seen on MRI ?demonstrates similar mild hypermetabolic activity. ?2. No other findings highly suspicious for metastatic disease. There ?is focal hypermetabolic activity anterolaterally in the right 9th ?rib which is probably due to a healing fracture. ?3. Focal hypermetabolic activity in the right colon near the hepatic ?flexure without clear corresponding abnormality on the CT images. ?This could be physiologic. However, if the patient is not up-to-date ?on colon cancer screening, appropriate screening should be ?considered. ?4. Previous right nephrectomy, cholecystectomy and gastric bypass. ?  ? ? ? ? ?Impression and Plan: ? ?61 year old with: ? ?1.  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  She initially presented with stage Ib disease including the right kidney. ? ?The natural course of her disease was reviewed at this time and treatment choices were reiterated.  PET scan obtained on September 29, 2021 was personally reviewed and showed no evidence of widespread metastatic disease.  Active surveillance versus adjuvant immunotherapy options were reviewed at this time.  She would be considered stage IV treated disease at this time and option would be continued active surveillance versus adjuvant Pembrolizumab.  Risks and benefits of both approaches were discussed at this time.  Complications associated with immunotherapy and include nausea, fatigue, autoimmune complications were reiterated. ? ?After discussion today, she will think about that and let me know in the near future. ? ? ?2.  Thoracic spine metastasis: She is status post stereotactic radiosurgery and no residual disease noted.  She will continue to follow-up with periodic imaging studies. ? ?3.  Autoimmune complications: We will continue to educate about these options and complications including pneumonitis, colitis and thyroid disease. ? ? ?4.  Follow-up:  Will be determined pending her decision regarding the start of immunotherapy. ? ?30  minutes were dedicated to this visit. The time was spent on reviewing laboratory data, imaging studies, discussing treatment options,  and answering questions regarding future plan. ? ? ? ?Dana Button, MD ?3/27/20239:15 AM ? ?

## 2021-10-11 NOTE — Progress Notes (Signed)
? ?Referring Provider: [name], MD ?Primary Care Physician:  [name], MD ?Primary Gastroenterologist:  Dr. Gala Romney ? ? ? ?Chief Complaint  ?Patient presents with  ? Colonoscopy  ? ? ?HPI:   ?Dana Hanson is a 61 y.o. female presenting today at the request of Dr. Willey Blade for consult colonoscopy.  Recommended office visit due to abnormal PET scan.  ? ?She has history of clear-cell renal cell carcinoma diagnosed in 2017 s/p right radical nephrectomy February 2017, initially stage Ib, but developed stage IV disease including T6 vertebral body metastasis in January 2023, confirmed by biopsy, now s/p spinal stereotactic radiosurgery 09/27/2021 for isolated metastasis.  Most recent visit with oncology 3/27.  Clinically, she was doing well.  Patient was going to think about whether she would continue with active surveillance versus start adjuvant pembrolizumab. ? ?Reviewed most recent PET scan 09/29/2021.  ?Impression:  ?The known pathologic fracture at T6 is mildly hypermetabolic. ?Adjacent superior endplate compression fracture seen on MRI ?demonstrates similar mild hypermetabolic activity. ?2. No other findings highly suspicious for metastatic disease. There ?is focal hypermetabolic activity anterolaterally in the right 9th ?rib which is probably due to a healing fracture. ?3. Focal hypermetabolic activity in the right colon near the hepatic ?flexure without clear corresponding abnormality on the CT images. ?This could be physiologic. However, if the patient is not up-to-date ?on colon cancer screening, appropriate screening should be ?considered. ?4. Previous right nephrectomy, cholecystectomy and gastric bypass. ? ? ?Last colonoscopy was in 2013 with significant external hemorrhoid tags, likely source of hematochezia, normal rectum, multiple colonic polyps at the base of the cecum, all less than 5 mm, single 4 mm sigmoid polyp, scattered diverticula.  Pathology revealed tubular adenoma in the cecum, hyperplastic  polyp in the sigmoid colon.  Recommended repeat colonoscopy in 3 years. ? ? ?Today: ?Doing well overall. No GI concerns.  Denies abdominal pain, constipation, diarrhea, BRBPR, melena, unintentional weight loss, reflux symptoms, dysphagia, nausea, vomiting. ? ?Weight has been stable over the last 3 months.  She did lose about 13 pounds between September 2021 to June 2022 and an additional 8 pound weight loss between June 2022 in January 2023. She was doing weight watchers.  ? ?Past Medical History:  ?Diagnosis Date  ? Cancer [facility])   ? kidney  ? Gallstone pancreatitis   ? Hyperlipidemia   ? IBS (irritable bowel syndrome)   ? patient denies  ? Right renal mass   ? ? ?Past Surgical History:  ?Procedure Laterality Date  ? CESAREAN SECTION    ? x 2-tubal with last c-section  ? CHOLECYSTECTOMY N/A 09/03/2015  ? Procedure: LAPAROSCOPIC CHOLECYSTECTOMY WITH INTRAOPERATIVE CHOLANGIOGRAM;  Surgeon: Mickeal Skinner, MD;  Location: [facility];  Service: General;  Laterality: N/A;  ? COLONOSCOPY  08/14/2011  ? Surgeon: Daneil Dolin, MD; external hemorrhoid tags, likely source of hematochezia, normal rectum, multiple colonic polyps at the base of the cecum, all less than 5 mm, single 4 mm sigmoid polyp, scattered diverticula.  Pathology revealed tubular adenoma in the cecum, hyperplastic polyp in the sigmoid colon.  Recommended repeat colonoscopy in 3 years.  ? HEMORRHOID SURGERY  11/10/2011  ? Procedure: HEMORRHOIDECTOMY;  Surgeon: Donato Heinz, MD;  Location: [facility];  Service: General;  Laterality: N/A;  ? [place] RESECTION  01/15/2015  ? LAPAROSCOPIC NEPHRECTOMY N/A 09/03/2015  ? Procedure: LAPAROSCOPIC RADICAL NEPHRECTOMY;  Surgeon: Ardis Hughs, MD;  Location: [facility];  Service: Urology;  Laterality: N/A;  ? TUBAL LIGATION    ?  with last c-section  ? VERTEBROPLASTY N/A 09/09/2021  ? Procedure: Thoracic vertebral biopsy;  Surgeon: Ashok Pall, MD;  Location: [facility];  Service: Neurosurgery;   Laterality: N/A;  RM 21  ? ? ?Current Outpatient Medications  ?Medication Sig Dispense Refill  ? acetaminophen (TYLENOL) 325 MG tablet Take 2 tablets (650 mg total) by mouth every 6 (six) hours as needed for moderate pain. 30 tablet 0  ? Multiple Vitamin (MULITIVITAMIN WITH MINERALS) TABS Take 1 tablet by mouth daily.    ? pregabalin (LYRICA) 50 MG capsule Take 50 mg by mouth 3 (three) times daily.    ? ?No current facility-administered medications for this visit.  ? ? ?Allergies as of 10/12/2021  ? (No Known Allergies)  ? ? ?Family History  ?Adopted: Yes  ?Problem Relation Age of Onset  ? Colon cancer Neg Hx   ? Liver disease Neg Hx   ? Anesthesia problems Neg Hx   ? Hypotension Neg Hx   ? Malignant hyperthermia Neg Hx   ? Pseudochol deficiency Neg Hx   ? ? ?Social History  ? ?Socioeconomic History  ? Marital status: Divorced  ?  Spouse name: Not on file  ? Number of children: 2  ? Years of education: Not on file  ? Highest education level: Bachelor's degree (e.g., BA, AB, BS)  ?Occupational History  ?  Employer: [id number]  ?Tobacco Use  ? Smoking status: Never  ? Smokeless tobacco: Never  ?Vaping Use  ? Vaping Use: Never used  ?Substance and Sexual Activity  ? Alcohol use: No  ? Drug use: No  ? Sexual activity: Yes  ?  Birth control/protection: Surgical  ?Other Topics Concern  ? Not on file  ?Social History Narrative  ? Lives alone  ? ?Social Determinants of Health  ? ?Financial Resource Strain: Not on file  ?Food Insecurity: Not on file  ?Transportation Needs: Not on file  ?Physical Activity: Not on file  ?Stress: Not on file  ?Social Connections: Not on file  ?Intimate Partner Violence: Not on file  ? ? ?Review of Systems: ?Gen: Denies any fever, chills, cold or flulike symptoms, presyncope, syncope. ?CV: Denies chest pain, heart palpitations. ?Resp: Denies shortness of breath or cough. ?GI: See HPI ?GU : Denies urinary burning, urinary frequency, urinary hesitancy ?MS: Denies joint pain  ?Derm: Denies  rash ?Psych: Denies depression, anxiety ?Heme: See HPI ? ?Physical Exam: ?BP 112/72   Pulse 88   Temp 97.6 ?F (36.4 ?C) (Temporal)   Ht '5\' 7"'$  (1.702 m)   Wt 165 lb 9.6 oz (75.1 kg)   LMP 10/27/2013   BMI 25.94 kg/m?  ?General:   Alert and oriented. Pleasant and cooperative. Well-nourished and well-developed.  ?Head:  Normocephalic and atraumatic. ?Eyes:  Without icterus, sclera clear and conjunctiva pink.  ?Ears:  Normal auditory acuity. ?Lungs:  Clear to auscultation bilaterally. No wheezes, rales, or rhonchi. No distress.  ?Heart:  S1, S2 present without murmurs appreciated.  ?Abdomen:  +BS, soft, non-tender and non-distended. No HSM noted. No guarding or rebound. No masses appreciated.  ?Rectal:  Deferred  ?Msk:  Symmetrical without gross deformities. Normal posture. ?Extremities:  Without edema. ?Neurologic:  Alert and  oriented x4;  grossly normal neurologically. ?Skin:  Intact without significant lesions or rashes. ?Psych:  Normal mood and affect. ? ? ? ?Assessment:  ?61 year old female with history of clear-cell renal cell carcinoma diagnosed in 2017 s/p right radical nephrectomy February 2017, initially stage Ib, but developed stage IV disease including T6  vertebral body metastasis in January 2023, confirmed by biopsy, now s/p spinal stereotactic radiosurgery 09/27/2021 for isolated metastasis, presenting today at the request of Dr. Willey Blade for consult colonoscopy.  Recommended office visit due to abnormal PET scan.  Reviewed most recent PET scan 09/29/2021 with known pathologic fracture at T6 mildly hypermetabolic, adjacent superior endplate compression fracture also with mild hypermetabolic activity, no other findings highly suspicious for metastatic disease.  There is focal hypermetabolic activity in the right colon near hepatic flexure without clear corresponding abnormality on CT which could be physiologic, but recommended age-appropriate colon cancer screening if patient was not up-to-date.  Her last  colonoscopy was in 2013 with significant external hemorrhoid tags as likely source of hematochezia, multiple polyps at the base of the cecum with pathology revealing tubular adenomas, single polyp in the sigmoid colon wit

## 2021-10-12 ENCOUNTER — Ambulatory Visit (INDEPENDENT_AMBULATORY_CARE_PROVIDER_SITE_OTHER): Payer: 59 | Admitting: Gastroenterology

## 2021-10-12 ENCOUNTER — Encounter: Payer: Self-pay | Admitting: Gastroenterology

## 2021-10-12 ENCOUNTER — Other Ambulatory Visit: Payer: Self-pay

## 2021-10-12 VITALS — BP 112/72 | HR 88 | Temp 97.6°F | Ht 67.0 in | Wt 165.6 lb

## 2021-10-12 DIAGNOSIS — Z86010 Personal history of colonic polyps: Secondary | ICD-10-CM | POA: Insufficient documentation

## 2021-10-12 DIAGNOSIS — R948 Abnormal results of function studies of other organs and systems: Secondary | ICD-10-CM | POA: Diagnosis not present

## 2021-10-12 MED ORDER — PEG 3350-KCL-NA BICARB-NACL 420 G PO SOLR: 4000.0000 mL | ORAL | 0 refills | Status: DC

## 2021-10-12 NOTE — Patient Instructions (Addendum)
We will arrange for colonoscopy in the near future with Dr. Gala Romney. ? ?We will follow-up with you as needed.  Do not hesitate to call if you have any GI questions or concerns. ? ?It was very nice meeting you today!  ? ?Aliene Altes, PA-C ?[facility] ? ?

## 2021-10-18 ENCOUNTER — Other Ambulatory Visit: Payer: Self-pay | Admitting: Oncology

## 2021-10-18 DIAGNOSIS — C641 Malignant neoplasm of right kidney, except renal pelvis: Secondary | ICD-10-CM

## 2021-10-18 NOTE — Progress Notes (Signed)
START ON PATHWAY REGIMEN - Renal Cell ? ? ?  A cycle is every 21 days: ?    Pembrolizumab  ? ?**Always confirm dose/schedule in your pharmacy ordering system** ? ?Patient Characteristics: ?Oligometastatic Disease ?Therapeutic Status: Oligometastatic Disease ?Intent of Therapy: ?Curative Intent, Discussed with Patient ?

## 2021-10-19 ENCOUNTER — Telehealth: Payer: Self-pay | Admitting: Oncology

## 2021-10-19 NOTE — Telephone Encounter (Signed)
.  Called pt per 4/5 inbasket , Patient was unavailable, a message with appt time and date was left with number on file.   ?

## 2021-10-23 ENCOUNTER — Encounter: Payer: Self-pay | Admitting: Emergency Medicine

## 2021-10-23 ENCOUNTER — Encounter (HOSPITAL_COMMUNITY): Payer: Self-pay

## 2021-10-23 ENCOUNTER — Ambulatory Visit
Admission: EM | Admit: 2021-10-23 | Discharge: 2021-10-23 | Disposition: A | Discharge status: Nursing Home (Includes ICF) | Payer: 59 | Attending: Family Medicine | Admitting: Family Medicine

## 2021-10-23 ENCOUNTER — Ambulatory Visit (INDEPENDENT_AMBULATORY_CARE_PROVIDER_SITE_OTHER): Payer: 59

## 2021-10-23 ENCOUNTER — Other Ambulatory Visit: Payer: Self-pay

## 2021-10-23 ENCOUNTER — Emergency Department (HOSPITAL_COMMUNITY)
Admission: EM | Admit: 2021-10-23 | Discharge: 2021-10-23 | Disposition: A | Discharge status: Home / Self Care | Payer: 59 | Attending: Emergency Medicine | Admitting: Emergency Medicine

## 2021-10-23 DIAGNOSIS — S42202A Unspecified fracture of upper end of left humerus, initial encounter for closed fracture: Secondary | ICD-10-CM

## 2021-10-23 DIAGNOSIS — Y999 Unspecified external cause status: Secondary | ICD-10-CM | POA: Insufficient documentation

## 2021-10-23 DIAGNOSIS — W19XXXA Unspecified fall, initial encounter: Secondary | ICD-10-CM

## 2021-10-23 DIAGNOSIS — R42 Dizziness and giddiness: Secondary | ICD-10-CM

## 2021-10-23 DIAGNOSIS — W010XXA Fall on same level from slipping, tripping and stumbling without subsequent striking against object, initial encounter: Secondary | ICD-10-CM | POA: Insufficient documentation

## 2021-10-23 DIAGNOSIS — M25512 Pain in left shoulder: Secondary | ICD-10-CM | POA: Diagnosis not present

## 2021-10-23 DIAGNOSIS — R110 Nausea: Secondary | ICD-10-CM

## 2021-10-23 DIAGNOSIS — S4292XA Fracture of left shoulder girdle, part unspecified, initial encounter for closed fracture: Secondary | ICD-10-CM

## 2021-10-23 DIAGNOSIS — Y9389 Activity, other specified: Secondary | ICD-10-CM | POA: Insufficient documentation

## 2021-10-23 DIAGNOSIS — Y92000 Kitchen of unspecified non-institutional (private) residence as  the place of occurrence of the external cause: Secondary | ICD-10-CM | POA: Diagnosis not present

## 2021-10-23 DIAGNOSIS — R55 Syncope and collapse: Secondary | ICD-10-CM

## 2021-10-23 DIAGNOSIS — S42295A Other nondisplaced fracture of upper end of left humerus, initial encounter for closed fracture: Secondary | ICD-10-CM | POA: Insufficient documentation

## 2021-10-23 DIAGNOSIS — I959 Hypotension, unspecified: Secondary | ICD-10-CM

## 2021-10-23 LAB — COMPREHENSIVE METABOLIC PANEL
ALT: 12 U/L (ref 0–44)
AST: 20 U/L (ref 15–41)
Albumin: 3.8 g/dL (ref 3.5–5.0)
Alkaline Phosphatase: 57 U/L (ref 38–126)
Anion gap: 9 (ref 5–15)
BUN: 24 mg/dL — ABNORMAL HIGH (ref 6–20)
CO2: 23 mmol/L (ref 22–32)
Calcium: 9.3 mg/dL (ref 8.9–10.3)
Chloride: 112 mmol/L — ABNORMAL HIGH (ref 98–111)
Creatinine, Ser: 1.05 mg/dL — ABNORMAL HIGH (ref 0.44–1.00)
GFR, Estimated: >60 mL/min (ref >60)
Glucose, Bld: 110 mg/dL — ABNORMAL HIGH (ref 70–99)
Potassium: 4.2 mmol/L (ref 3.5–5.1)
Sodium: 144 mmol/L (ref 135–145)
Total Bilirubin: 0.6 mg/dL (ref 0.3–1.2)
Total Protein: 6.4 g/dL — ABNORMAL LOW (ref 6.5–8.1)

## 2021-10-23 LAB — CBC WITH DIFFERENTIAL/PLATELET
Abs Immature Granulocytes: 0.02 10*3/uL (ref 0.00–0.07)
Basophils Absolute: 0 10*3/uL (ref 0.0–0.1)
Basophils Relative: 1 %
Eosinophils Absolute: 0.1 10*3/uL (ref 0.0–0.5)
Eosinophils Relative: 1 %
HCT: 36.1 % (ref 36.0–46.0)
Hemoglobin: 11.2 g/dL — ABNORMAL LOW (ref 12.0–15.0)
Immature Granulocytes: 0 %
Lymphocytes Relative: 18 %
Lymphs Abs: 0.8 10*3/uL (ref 0.7–4.0)
MCH: 28.1 pg (ref 26.0–34.0)
MCHC: 31 g/dL (ref 30.0–36.0)
MCV: 90.5 fL (ref 80.0–100.0)
Monocytes Absolute: 0.3 10*3/uL (ref 0.1–1.0)
Monocytes Relative: 7 %
Neutro Abs: 3.3 10*3/uL (ref 1.7–7.7)
Neutrophils Relative %: 73 %
Platelets: 178 10*3/uL (ref 150–400)
RBC: 3.99 MIL/uL (ref 3.87–5.11)
RDW: 14 % (ref 11.5–15.5)
WBC: 4.5 10*3/uL (ref 4.0–10.5)
nRBC: 0 % (ref 0.0–0.2)

## 2021-10-23 IMAGING — DX DG SHOULDER 2+V*L*
3 series · 3 of 3 positions shown · non-contrast
Comparison: None.

CLINICAL DATA: Patient fell the kitchen.  Left shoulder injury.

EXAM:
LEFT SHOULDER - 2+ VIEW

[shoulder internal rotation ap]
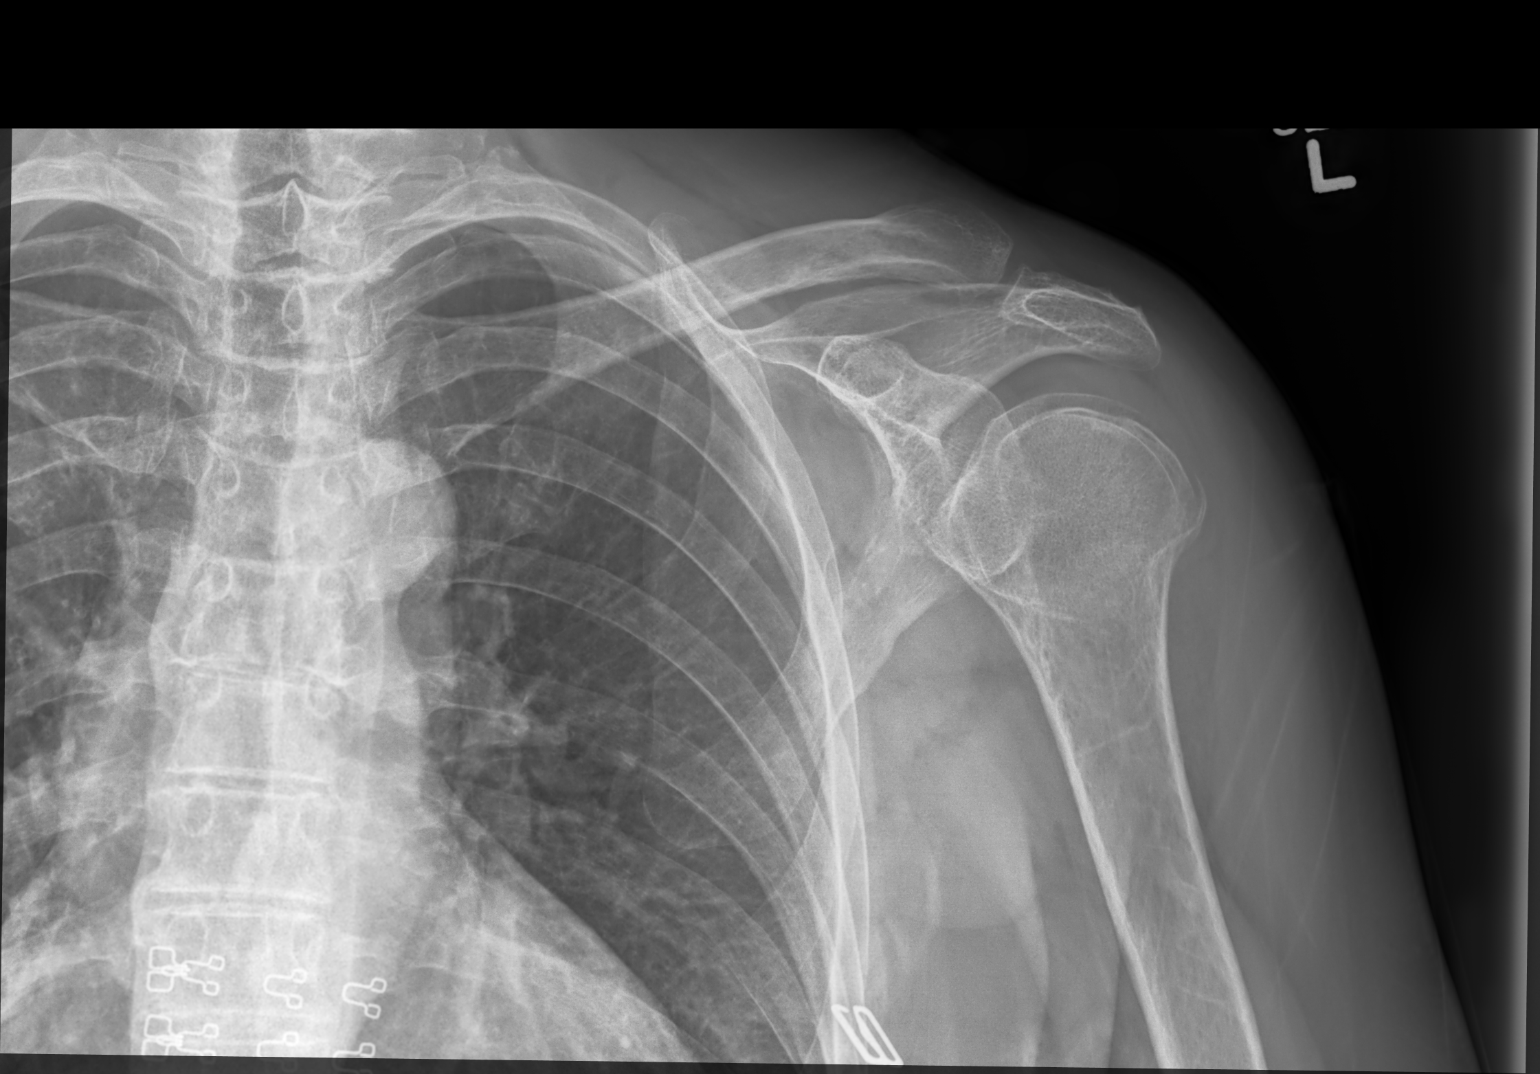

[shoulder (grashey view) ap]
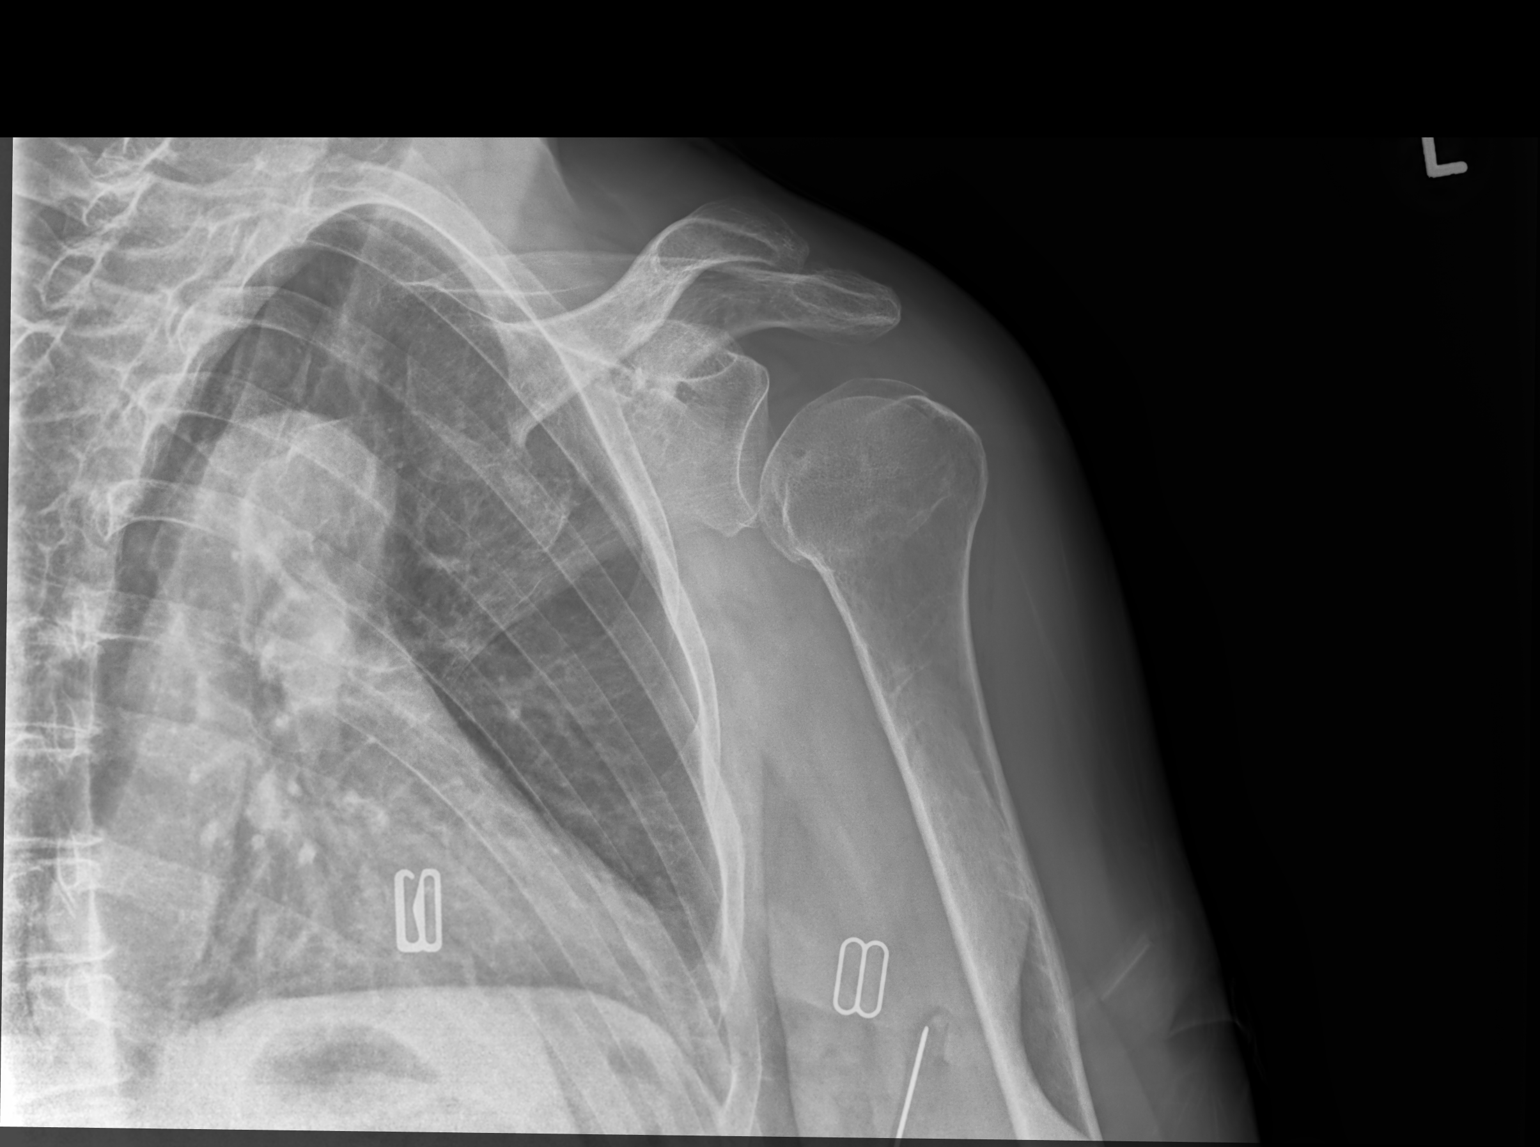

[shoulder (y view) lat]
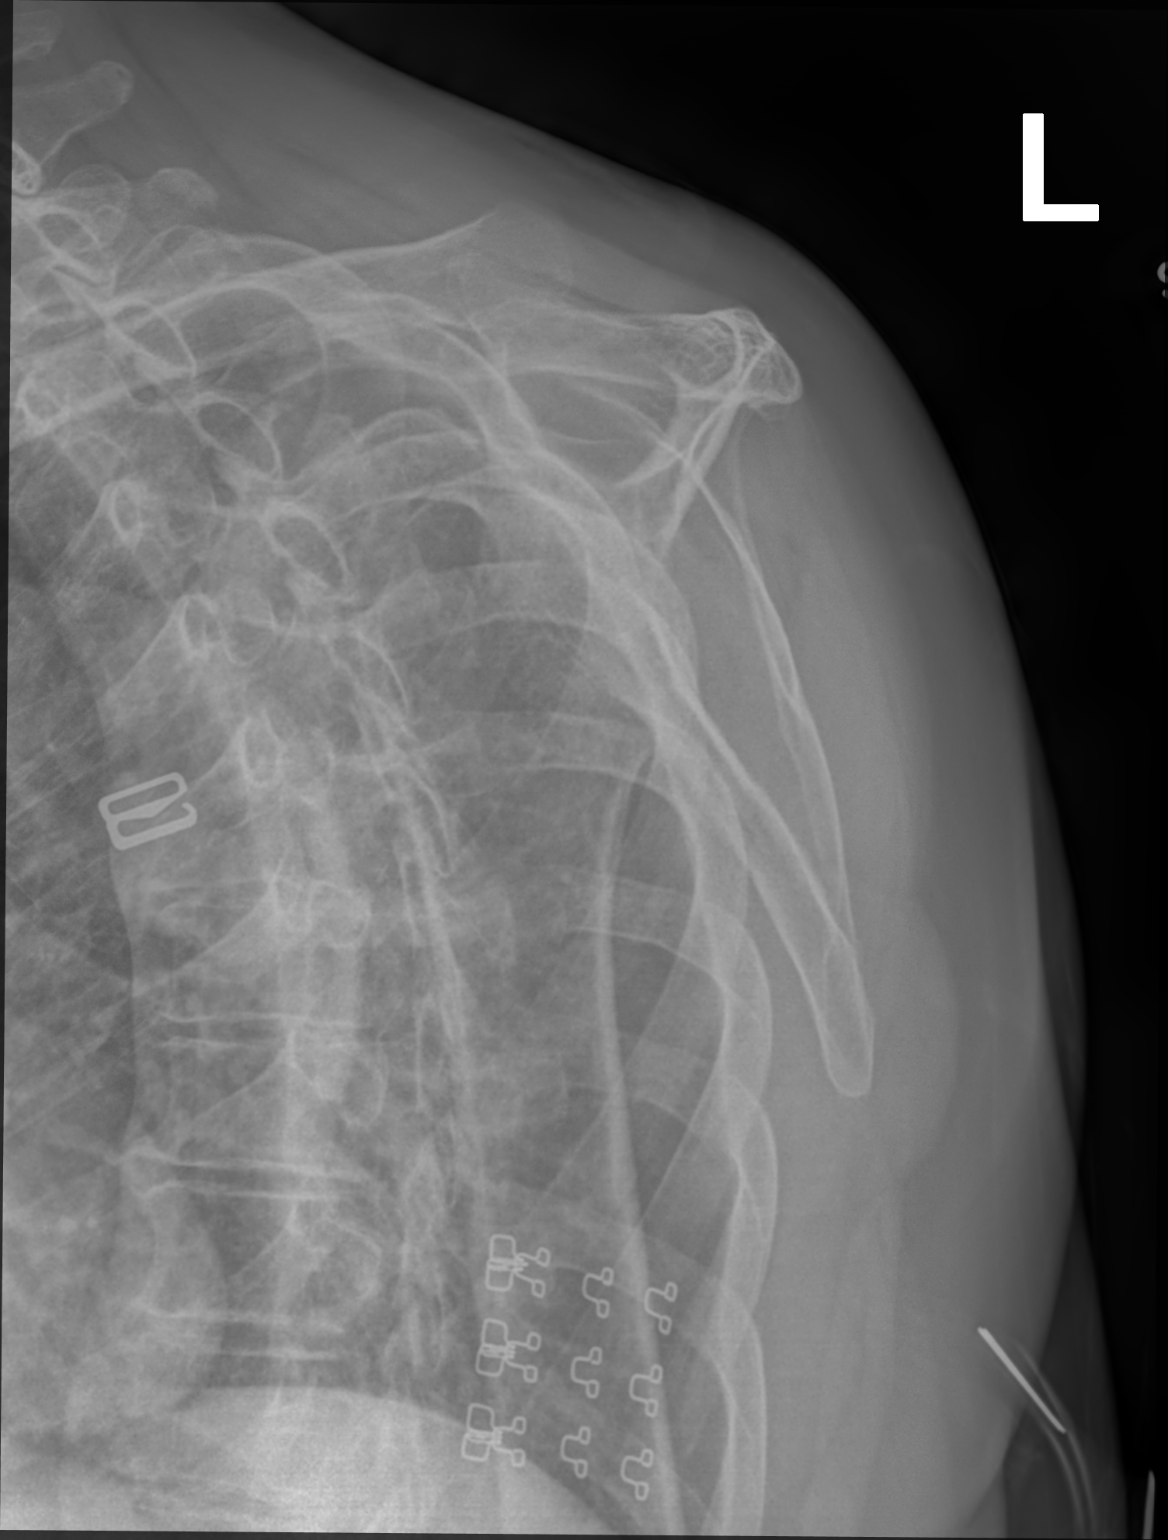

[3 of 3 positions shown; findings below may reference images not displayed]

FINDINGS: Two views study shows cortical disruption in the humeral head with
trabecular disruption in the region of the surgical neck. Bones are
diffusely demineralized.
IMPRESSION: Imaging features highly suspicious for nondisplaced tumoral
head/neck fracture. Consider CT imaging to further assess as
clinically warranted.

## 2021-10-23 MED ORDER — SODIUM CHLORIDE 0.9 % IV BOLUS
1000.0000 mL | Freq: Once | INTRAVENOUS | Status: DC
Start: 2021-10-23 — End: 2021-10-23

## 2021-10-23 MED ORDER — ONDANSETRON 4 MG PO TBDP
4.0000 mg | ORAL_TABLET | Freq: Once | ORAL | Status: AC
  Administered 2021-10-23: 4 mg via ORAL

## 2021-10-23 NOTE — ED Notes (Addendum)
Shoulder immobilizer applied. Pt reports " I feel like i'm going to pass out." Pt assisted back in chair and feet elevated. Syncopal episode occurred. PA aware and at bedside.  ? ?Pt now alert. EMS called and en route. ?

## 2021-10-23 NOTE — ED Provider Notes (Signed)
?[place] ? ? ? ?CSN: [id number] ?Arrival date & time: 10/23/21  1320 ? ? ?  ? ?History   ?Chief Complaint ?Chief Complaint  ?Patient presents with  ? Shoulder Pain  ? ? ?HPI ?Dana Hanson is a 61 y.o. female.  ? ?Patient presenting today with new onset left shoulder pain after a fall in her kitchen today where she tripped and fell directly onto the shoulder.  She states she immediately was sat up by family members and felt nauseated, lightheaded but did not pass out in the incident.  She took some Tylenol and placed her arm in a makeshift sling and came straight here.  She states she is continuing to have waves of nausea, lightheadedness since the incident though denies any headache, visual change, confusion, weakness numbness tingling in extremities.  She is unable to move the left shoulder at this time.  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  Her last meal was about an hour or 2 ago. ? ? ?Past Medical History:  ?Diagnosis Date  ? Cancer [facility])   ? kidney  ? Gallstone pancreatitis   ? Hyperlipidemia   ? IBS (irritable bowel syndrome)   ? patient denies  ? Right renal mass   ? ? ?Patient Active Problem List  ? Diagnosis Date Noted  ? History of colonic polyps 10/12/2021  ? Abnormal PET scan of colon 10/12/2021  ? Spine metastasis 09/27/2021  ? Cancer of right kidney ([place]) 09/03/2015  ? Renal mass 09/03/2015  ? Pancreatitis 08/21/2015  ? Biliary obstruction   ? Primary osteoarthritis of right knee 03/17/2014  ? Hemorrhoids 08/01/2011  ? Heme positive stool 08/01/2011  ? Rectal bleed 08/01/2011  ? ? ?Past Surgical History:  ?Procedure Laterality Date  ? CESAREAN SECTION    ? x 2-tubal with last c-section  ? CHOLECYSTECTOMY N/A 09/03/2015  ? Procedure: LAPAROSCOPIC CHOLECYSTECTOMY WITH INTRAOPERATIVE CHOLANGIOGRAM;  Surgeon: Mickeal Skinner, MD;  [facility];  Service: General;  Laterality:  N/A;  ? COLONOSCOPY  08/14/2011  ? Surgeon: Daneil Dolin, MD; external hemorrhoid tags, likely source of hematochezia, normal rectum, multiple colonic polyps at the base of the cecum, all less than 5 mm, single 4 mm sigmoid polyp, scattered diverticula.  Pathology revealed tubular adenoma in the cecum, hyperplastic polyp in the sigmoid colon.  Recommended repeat colonoscopy in 3 years.  ? HEMORRHOID SURGERY  11/10/2011  ? Procedure: HEMORRHOIDECTOMY;  Surgeon: Donato Heinz, MD;  Location: [facility];  Service: General;  Laterality: N/A;  ? [place] RESECTION  01/15/2015  ? LAPAROSCOPIC NEPHRECTOMY N/A 09/03/2015  ? Procedure: LAPAROSCOPIC RADICAL NEPHRECTOMY;  Surgeon: Ardis Hughs, MD;  [facility];  Service: Urology;  Laterality: N/A;  ? TUBAL LIGATION    ? with last c-section  ? VERTEBROPLASTY N/A 09/09/2021  ? Procedure: Thoracic vertebral biopsy;  Surgeon: Ashok Pall, MD;  Location: [place];  Service: Neurosurgery;  Laterality: N/A;  RM 21  ? ? ?OB History   ?No obstetric history on file. ?  ? ? ? ?Home Medications   ? ?Prior to Admission medications   ?Medication Sig Start Date End Date Taking? Authorizing Provider  ?acetaminophen (TYLENOL) 325 MG tablet Take 2 tablets (650 mg total) by mouth every 6 (six) hours as needed for moderate pain. 08/07/21   [name], PA-C  ?Multiple Vitamin (MULITIVITAMIN WITH MINERALS) TABS Take 1 tablet by  mouth daily.    [provider]  ?polyethylene glycol-electrolytes (TRILYTE) 420 g solution Take 4,000 mLs by mouth as directed. 10/12/21   Rourk, [name], MD  ?pregabalin (LYRICA) 50 MG capsule Take 50 mg by mouth 3 (three) times daily. 07/22/21   [provider]  ? ? ?Family History ?Family History  ?Adopted: Yes  ?Problem Relation Age of Onset  ? Colon cancer Neg Hx   ? Liver disease Neg Hx   ? Anesthesia problems Neg Hx   ? Hypotension Neg Hx   ? Malignant hyperthermia Neg Hx   ? Pseudochol deficiency Neg Hx   ? ? ?Social  History ?Social History  ? ?Tobacco Use  ? Smoking status: Never  ? Smokeless tobacco: Never  ?Vaping Use  ? Vaping Use: Never used  ?Substance Use Topics  ? Alcohol use: No  ? Drug use: No  ? ? ? ?Allergies   ?Patient has no known allergies. ? ? ?Review of Systems ?Review of Systems ?Per HPI ? ?Physical Exam ?Triage Vital Signs ?ED Triage Vitals  ?Enc Vitals Group  ?   BP 10/23/21 1339 94/61  ?   Pulse Rate 10/23/21 1339 80  ?   Resp 10/23/21 1339 18  ?   Temp 10/23/21 1339 97.8 ?F (36.6 ?C)  ?   Temp Source 10/23/21 1339 Oral  ?   SpO2 10/23/21 1339 97 %  ?   Weight 10/23/21 1340 162 lb (73.5 kg)  ?   Height 10/23/21 1340 '5\' 6"'$  (1.676 m)  ?   Head Circumference --   ?   Peak Flow --   ?   Pain Score 10/23/21 1340 2  ?   Pain Loc --   ?   Pain Edu? --   ?   Excl. in [place]? --   ? ?No data found. ? ?Updated Vital Signs ?BP (!) 89/59 (BP Location: Right Arm)   Pulse 75   Temp 97.8 ?F (36.6 ?C) (Oral)   Resp 18   Ht '5\' 6"'$  (1.676 m)   Wt 162 lb (73.5 kg)   LMP 10/27/2013   SpO2 97%   BMI 26.15 kg/m?  ? ?Visual Acuity ?Right Eye Distance:   ?Left Eye Distance:   ?Bilateral Distance:   ? ?Right Eye Near:   ?Left Eye Near:    ?Bilateral Near:    ? ?Physical Exam ?Vitals and nursing note reviewed.  ?Constitutional:   ?   Appearance: She is not ill-appearing.  ?   Comments: Appears mildly weak but alert and oriented, guarding her left arm  ?HENT:  ?   Head: Atraumatic.  ?   Mouth/Throat:  ?   Mouth: Mucous membranes are moist.  ?Eyes:  ?   Extraocular Movements: Extraocular movements intact.  ?   Conjunctiva/sclera: Conjunctivae normal.  ?   Pupils: Pupils are equal, round, and reactive to light.  ?Cardiovascular:  ?   Rate and Rhythm: Normal rate and regular rhythm.  ?   Heart sounds: Normal heart sounds.  ?Pulmonary:  ?   Effort: Pulmonary effort is normal.  ?   Breath sounds: Normal breath sounds.  ?Musculoskeletal:     ?   General: Swelling, tenderness and signs of injury present.  ?   Cervical back: Normal range  of motion and neck supple.  ?   Comments: Minimal range of motion in the left shoulder, exam very limited today due to severity of her pain.  Grip strength full and equal bilateral  hands.    ?Skin: ?   General: Skin is warm and dry.  ?   Findings: No bruising or erythema.  ?Neurological:  ?   Mental Status: She is alert and oriented to person, place, and time.  ?   Cranial Nerves: No cranial nerve deficit.  ?   Motor: No weakness.  ?   Gait: Gait normal.  ?Psychiatric:     ?   Mood and Affect: Mood normal.     ?   Thought Content: Thought content normal.     ?   Judgment: Judgment normal.  ? ? ? ?UC Treatments / Results  ?Labs ?(all labs ordered are listed, but only abnormal results are displayed) ?Labs Reviewed  ?POCT FASTING CBG [facility] MANUAL ENTRY  ? ? ?EKG ? ? ?Radiology ?No results found. ? ?Procedures ?Procedures (including critical care time) ? ?Medications Ordered in UC ?Medications  ?ondansetron (ZOFRAN-ODT) disintegrating tablet 4 mg (4 mg Oral Given 10/23/21 1421)  ? ? ?Initial Impression / Assessment and Plan / UC Course  ?I have reviewed the triage vital signs and the nursing notes. ? ?Pertinent labs & imaging results that were available during my care of the patient were reviewed by me and considered in my medical decision making (see chart for details). ? ?  ? ?Zofran administered earlier on in visit for active nausea, suspected to be secondary to pain.  She has remained hypotensive since arrival today which is unexplained as this is not typically her baseline.  She continued to be lightheaded and nauseous throughout exam and interview, and during placement of the shoulder immobilizer for humeral head and neck fracture seen on x-ray today of the left shoulder, patient had a syncopal episode where she was unconscious for at least 5 seconds, confused with difficulty speaking for about 30 seconds after awakening and complaining of sharp shooting pains down both legs.  EMS was initiated at this point,  transported to emergency department for further evaluation of hypertension, syncopal episode, nausea. ? ?Final Clinical Impressions(s) / UC Diagnoses  ? ?Final diagnoses:  ?Closed fracture of proximal end of left humerus,

## 2021-10-23 NOTE — ED Notes (Addendum)
Awaiting EMS transport. Pt alert,oriented. ? ?pt reports pain only in left shoulder.  ? ?

## 2021-10-23 NOTE — ED Triage Notes (Addendum)
Pt reports was walking in kitchen and reports felt something under foot and reports looked down and twisted and reports fell on left shoulder.  ? ?Denies loc or hitting head. Pt reports intermittent nausea, lightheadedness since fall.  ? ?Pt wearing sling that was placed PTA. ? ?No obvious deformity noted, pt guarding when sling removed.  ?

## 2021-10-23 NOTE — ED Triage Notes (Signed)
"  Slipped and fell in kitchen on to my left arm and the pain was severe and I couldn't lift it. I went to urgent care and while I was there the pain would hit and I would get light headed and nauseated. At one point I passed out for a few seconds and they checked my BP and it was in the 37V systolic. That is when they called EMS to bring me in" per pt ?

## 2021-10-23 NOTE — ED Notes (Signed)
Alert and oriented x 4. Gait steady. No signs of distress. Shoulder immobilizer in place.  ?

## 2021-10-23 NOTE — ED Notes (Signed)
CBG 140  

## 2021-10-23 NOTE — ED Provider Notes (Signed)
?[place] ?Provider Note ? ? ?CSN: [id number] ?Arrival date & time: 10/23/21  1515 ? ?  ? ?History ? ?Chief Complaint  ?Patient presents with  ? Loss of Consciousness  ? ? ?Dana Hanson is a 61 y.o. female. ? ?Patient fell on her left shoulder.  She was seen at the urgent care and had x-rays done that showed a fractured left shoulder.  Patient has a history of kidney cancer.  Patient has syncopal episode there.  No past medical history ? ?The history is provided by the patient and medical records. No language interpreter was used.  ?Fall ?This is a new problem. The current episode started 2 days ago. The problem occurs rarely. The problem has been resolved. Pertinent negatives include no chest pain, no abdominal pain and no headaches. The symptoms are aggravated by twisting. Nothing relieves the symptoms. She has tried nothing for the symptoms. The treatment provided no relief.  ? ?  ? ?Home Medications ?Prior to Admission medications   ?Medication Sig Start Date End Date Taking? Authorizing Provider  ?acetaminophen (TYLENOL) 325 MG tablet Take 2 tablets (650 mg total) by mouth every 6 (six) hours as needed for moderate pain. 08/07/21   [name], PA-C  ?Multiple Vitamin (MULITIVITAMIN WITH MINERALS) TABS Take 1 tablet by mouth daily.    [provider]  ?polyethylene glycol-electrolytes (TRILYTE) 420 g solution Take 4,000 mLs by mouth as directed. 10/12/21   Rourk, [name], MD  ?pregabalin (LYRICA) 50 MG capsule Take 50 mg by mouth 3 (three) times daily. 07/22/21   [provider]  ?   ? ?Allergies    ?Patient has no known allergies.   ? ?Review of Systems   ?Review of Systems  ?Constitutional:  Negative for appetite change and fatigue.  ?HENT:  Negative for congestion, ear discharge and sinus pressure.   ?Eyes:  Negative for discharge.  ?Respiratory:  Negative for cough.   ?Cardiovascular:  Positive for syncope. Negative for chest pain.  ?Gastrointestinal:  Negative for  abdominal pain and diarrhea.  ?Genitourinary:  Negative for frequency and hematuria.  ?Musculoskeletal:  Negative for back pain.  ?     Left shoulder pain  ?Skin:  Negative for rash.  ?Neurological:  Negative for seizures and headaches.  ?Psychiatric/Behavioral:  Negative for hallucinations.   ? ?Physical Exam ?Updated Vital Signs ?BP 107/60 (BP Location: Right Arm)   Pulse (!) 52   Temp 98 ?F (36.7 ?C) (Oral)   Resp 20   Ht '5\' 6"'$  (1.676 m)   Wt 73.5 kg   LMP 10/27/2013   SpO2 99%   BMI 26.15 kg/m?  ?Physical Exam ?Vitals and nursing note reviewed.  ?Constitutional:   ?   Appearance: She is well-developed.  ?HENT:  ?   Head: Normocephalic.  ?   Nose: Nose normal.  ?Eyes:  ?   General: No scleral icterus. ?   Conjunctiva/sclera: Conjunctivae normal.  ?Neck:  ?   Thyroid: No thyromegaly.  ?Cardiovascular:  ?   Rate and Rhythm: Normal rate and regular rhythm.  ?   Heart sounds: No murmur heard. ?  No friction rub. No gallop.  ?Pulmonary:  ?   Breath sounds: No stridor. No wheezing or rales.  ?Chest:  ?   Chest wall: No tenderness.  ?Abdominal:  ?   General: There is no distension.  ?   Tenderness: There is no abdominal tenderness. There is no rebound.  ?Musculoskeletal:  ?   Cervical back: Neck  supple.  ?   Comments: Tenderness left shoulder  ?Lymphadenopathy:  ?   Cervical: No cervical adenopathy.  ?Skin: ?   Findings: No erythema or rash.  ?Neurological:  ?   Mental Status: She is alert and oriented to person, place, and time.  ?   Motor: No abnormal muscle tone.  ?   Coordination: Coordination normal.  ?Psychiatric:     ?   Behavior: Behavior normal.  ? ? ?ED Results / Procedures / Treatments   ?Labs ?(all labs ordered are listed, but only abnormal results are displayed) ?Labs Reviewed  ?CBC WITH DIFFERENTIAL/PLATELET - Abnormal; Notable for the following components:  ?    Result Value  ? Hemoglobin 11.2 (*)   ? All other components within normal limits  ?COMPREHENSIVE METABOLIC PANEL - Abnormal; Notable for  the following components:  ? Chloride 112 (*)   ? Glucose, Bld 110 (*)   ? BUN 24 (*)   ? Creatinine, Ser 1.05 (*)   ? Total Protein 6.4 (*)   ? All other components within normal limits  ? ? ?EKG ?None ? ?Radiology ?DG Shoulder Left ? ?Result Date: 10/23/2021 ?CLINICAL DATA:  Patient fell the kitchen.  Left shoulder injury. EXAM: LEFT SHOULDER - 2+ VIEW COMPARISON:  None. FINDINGS: Two views study shows cortical disruption in the humeral head with trabecular disruption in the region of the surgical neck. Bones are diffusely demineralized. IMPRESSION: Imaging features highly suspicious for nondisplaced tumoral head/neck fracture. Consider CT imaging to further assess as clinically warranted. Electronically Signed   By: Misty Stanley M.D.   On: 10/23/2021 13:58   ? ?Procedures ?Procedures  ? ? ?Medications Ordered in ED ?Medications - No data to display ? ?ED Course/ Medical Decision Making/ A&P ?  ?                        ?Medical Decision Making ?Amount and/or Complexity of Data Reviewed ?Labs: ordered. ?ECG/medicine tests: ordered. ? ?This patient presents to the ED for concern of fall, this involves an extensive number of treatment options, and is a complaint that carries with it a high risk of complications and morbidity.  The differential diagnosis includes fracture shoulder ? ? ?Co morbidities that complicate the patient evaluation ? ?Kidney cancer ? ? ?Additional history obtained: ? ?Additional history obtained from friend ?External records from outside source obtained and reviewed including hospital records ? ? ?Lab Tests: ? ?I Ordered, and personally interpreted labs.  The pertinent results include: CBC shows hemoglobin 11, BUN 24 creatinine 1. ? ? ?Imaging Studies ordered: ? ?I ordered imaging studies including left shoulder ?I independently visualized and interpreted imaging which showed fractured left shoulder ?I agree with the radiologist interpretation ? ? ?Cardiac Monitoring: / EKG: ? ?The patient was  maintained on a cardiac monitor.  I personally viewed and interpreted the cardiac monitored which showed an underlying rhythm of: Normal sinus ? ? ?Consultations Obtained: ? ?No consultant ? ?Problem List / ED Course / Critical interventions / Medication management ? ?Fracture left shoulder dehydration ?No medicines given ?Reevaluation of the patient after these medicines showed that the patient improved ?I have reviewed the patients home medicines and have made adjustments as needed ? ? ?Social Determinants of Health: ? ?none ? ? ? ?Test / Admission - Considered: ? ? ?none ? ?Patient with syncope and fractured left shoulder.  Patient will follow-up with her orthopedic doctor ? ? ? ? ? ? ? ?Final Clinical  Impression(s) / ED Diagnoses ?Final diagnoses:  ?Near syncope  ?Shoulder fracture, left, closed, initial encounter  ? ? ?Rx / DC Orders ?ED Discharge Orders   ? ? None  ? ?  ? ? ?  ?Milton Ferguson, MD ?10/26/21 0848 ? ?

## 2021-10-23 NOTE — Discharge Instructions (Signed)
Take your pain medicines as needed for your shoulder and follow-up with your orthopedic this week.  Drink plenty of fluids ?

## 2021-10-23 NOTE — ED Notes (Addendum)
Called EMS and inquired about ETA. Per c-com, ticket had not been placed.  ? ?Pt information provided again and reported a truck would be dispatched. ?

## 2021-10-23 NOTE — ED Notes (Signed)
EMS at bedside.Report given to paramedic. ?

## 2021-10-23 NOTE — ED Notes (Addendum)
[facility] notified. Report called to [employer], Therapist, sports. ?

## 2021-10-26 ENCOUNTER — Encounter: Payer: Self-pay | Admitting: Orthopedic Surgery

## 2021-10-26 ENCOUNTER — Telehealth: Payer: Self-pay | Admitting: *Deleted

## 2021-10-26 ENCOUNTER — Ambulatory Visit (INDEPENDENT_AMBULATORY_CARE_PROVIDER_SITE_OTHER): Payer: 59 | Admitting: Orthopedic Surgery

## 2021-10-26 VITALS — BP 112/70 | HR 96 | Ht 67.0 in | Wt 163.0 lb

## 2021-10-26 DIAGNOSIS — S42295A Other nondisplaced fracture of upper end of left humerus, initial encounter for closed fracture: Secondary | ICD-10-CM

## 2021-10-26 NOTE — Progress Notes (Signed)
EVALUATION AND MANAGEMENT  ? ?Type of appointment : ER FOLLOW UP  ? ?PLAN: IMMOBILIZATION LEFT SHOULDER  ? ?No orders of the defined types were placed in this encounter. ? ? ? ?Chief Complaint  ?Patient presents with  ? Shoulder Injury  ?  LT shoulder fracture ?DOI 10/23/21  ? ? ?61 year old female with recent recurrent renal cell carcinoma diagnosed from a lumbar spine fracture fell landed directly on her left shoulder ? ?X-rays from the ER show a humeral head fracture impacted ? ? ?ROS ? ?Pain right rib cage from a recent fracture thought to be from positioning for the biopsy for the lumbar spine ?Body mass index is 25.53 kg/m?. ? ?Physical Exam ?Vitals reviewed: WELLDEVELOPED WELL NOURISHED NO CONGENITAL ABNORMALITIES.  ?Cardiovascular:  ?   Pulses: Normal pulses.  ?   Comments: NO SWELLING OR VARICOSITIES  ?Musculoskeletal:  ?   Right knee: Normal.  ?   Left knee: Normal.  ?   Comments: GAIT normal ? ?Left shoulder no swelling mild tenderness no deformity  ?Skin: ?   General: Skin is warm and dry.  ?   Capillary Refill: Capillary refill takes less than 2 seconds.  ?   Findings: No bruising, erythema or rash.  ?Neurological:  ?   General: No focal deficit present.  ?   Mental Status: She is oriented to person, place, and time.  ?   Comments: NORMAL SENSATION IN BOTH upper extremities  ?Psychiatric:     ?   Mood and Affect: Mood normal.     ?   Behavior: Behavior normal.     ?   Thought Content: Thought content normal.     ?   Judgment: Judgment normal.  ? ? ?Past Medical History:  ?Diagnosis Date  ? Cancer [facility])   ? kidney  ? Gallstone pancreatitis   ? Hyperlipidemia   ? IBS (irritable bowel syndrome)   ? patient denies  ? Right renal mass   ? ?Past Surgical History:  ?Procedure Laterality Date  ? CESAREAN SECTION    ? x 2-tubal with last c-section  ? CHOLECYSTECTOMY N/A 09/03/2015  ? Procedure: LAPAROSCOPIC CHOLECYSTECTOMY WITH INTRAOPERATIVE CHOLANGIOGRAM;  Surgeon: Mickeal Skinner, MD;  [facility];   Service: General;  Laterality: N/A;  ? COLONOSCOPY  08/14/2011  ? Surgeon: Daneil Dolin, MD; external hemorrhoid tags, likely source of hematochezia, normal rectum, multiple colonic polyps at the base of the cecum, all less than 5 mm, single 4 mm sigmoid polyp, scattered diverticula.  Pathology revealed tubular adenoma in the cecum, hyperplastic polyp in the sigmoid colon.  Recommended repeat colonoscopy in 3 years.  ? HEMORRHOID SURGERY  11/10/2011  ? Procedure: HEMORRHOIDECTOMY;  Surgeon: Donato Heinz, MD;  Location: [facility];  Service: General;  Laterality: N/A;  ? [place] RESECTION  01/15/2015  ? LAPAROSCOPIC NEPHRECTOMY N/A 09/03/2015  ? Procedure: LAPAROSCOPIC RADICAL NEPHRECTOMY;  Surgeon: Ardis Hughs, MD;  [facility];  Service: Urology;  Laterality: N/A;  ? TUBAL LIGATION    ? with last c-section  ? VERTEBROPLASTY N/A 09/09/2021  ? Procedure: Thoracic vertebral biopsy;  Surgeon: Ashok Pall, MD;  Location: [place];  Service: Neurosurgery;  Laterality: N/A;  RM 21  ? ?Social History  ? ?Tobacco Use  ? Smoking status: Never  ? Smokeless tobacco: Never  ?Vaping Use  ? Vaping Use: Never used  ?Substance Use Topics  ? Alcohol use: No  ? Drug use: No  ? ? ? ?Assessment  and Plan: ? ?Imaging: I have personally reviewed the images and my personal interpretation of the images: Outside images show an impacted humeral head fracture nondisplaced no dislocation ?No diagnosis found. ? ?Recommend sling and swathe x-ray in a week x-ray every week for 2 weeks then physical therapy ?

## 2021-10-26 NOTE — Telephone Encounter (Signed)
Pt called in. Needing to reschedule her procedure on 4/24. She has been rescheduled to 5/17 at 7:30am. Aware will mail new prep instructions. Message sent to endo to change appt ?

## 2021-10-27 ENCOUNTER — Inpatient Hospital Stay: Payer: 59

## 2021-10-31 ENCOUNTER — Ambulatory Visit (INDEPENDENT_AMBULATORY_CARE_PROVIDER_SITE_OTHER): Payer: 59

## 2021-10-31 ENCOUNTER — Ambulatory Visit (INDEPENDENT_AMBULATORY_CARE_PROVIDER_SITE_OTHER): Payer: 59 | Admitting: Orthopedic Surgery

## 2021-10-31 DIAGNOSIS — S42295D Other nondisplaced fracture of upper end of left humerus, subsequent encounter for fracture with routine healing: Secondary | ICD-10-CM

## 2021-10-31 MED ORDER — HYDROCODONE-ACETAMINOPHEN 5-325 MG PO TABS: 1.0000 | ORAL_TABLET | Freq: Four times a day (QID) | ORAL | 0 refills | Status: DC | PRN

## 2021-10-31 NOTE — Progress Notes (Signed)
Chief Complaint  ?Patient presents with  ? fracture care  ?  LEFT shoulder ?DOI 10/23/21  ? ?Left proximal humerus fracture nondisplaced ? ?Patient complains of pain ran out of pain medicine ? ?Currently in shoulder immobilizer ? ?X-rays show no change in position of the fracture ? ?Recommend continued sling treatment ? ?Refill pain medication ? ?X-ray in a week ?Meds ordered this encounter  ?Medications  ? HYDROcodone-acetaminophen (NORCO/VICODIN) 5-325 MG tablet  ?  Sig: Take 1 tablet by mouth every 6 (six) hours as needed for moderate pain.  ?  Dispense:  30 tablet  ?  Refill:  0  ? ? ?

## 2021-11-07 ENCOUNTER — Ambulatory Visit (INDEPENDENT_AMBULATORY_CARE_PROVIDER_SITE_OTHER): Payer: 59

## 2021-11-07 ENCOUNTER — Ambulatory Visit (INDEPENDENT_AMBULATORY_CARE_PROVIDER_SITE_OTHER): Payer: 59 | Admitting: Orthopedic Surgery

## 2021-11-07 DIAGNOSIS — S42295D Other nondisplaced fracture of upper end of left humerus, subsequent encounter for fracture with routine healing: Secondary | ICD-10-CM

## 2021-11-07 NOTE — Patient Instructions (Addendum)
Physical therapy has been ordered for you at [facility]. They should call you to schedule, [phone] is the phone number to call, if you want to call to schedule.   ? ?Follow up in 6 weeks ?

## 2021-11-07 NOTE — Progress Notes (Signed)
FOLLOW UP  ? ?Encounter Diagnosis  ?Name Primary?  ? Other closed nondisplaced fracture of proximal end of left humerus with routine healing, subsequent encounter Yes  ? ? ? ?Chief Complaint  ?Patient presents with  ? Post-op Follow-up  ?  Proximal humerus fracture DOI 10/23/21  ? ? ? ?Dana Hanson is feeling okay in terms of her shoulder she had a rough weekend did not feel good her back is feeling okay at this point seems like she is distracted by the shoulder ? ?Her x-ray looks good she can start OT ? ?I was able to externally rotate her arm 45 degrees abducted 60 degrees she tolerated that well she could extend to 30 degrees passively so I think OT will be fine follow-up in 6 weeks ?

## 2021-11-09 ENCOUNTER — Encounter: Payer: Self-pay | Admitting: Urology

## 2021-11-09 NOTE — Progress Notes (Signed)
Telephone appointment. I verified patient identity and began nursing interview. Patient reports doing well. No issues reported at this time. ? ?Meaningful use complete. ?Postmenopausal- NO chances of pregnancy. ? ?Reminded patient of her 10:30am-11/10/21 telephone appointment w/ Ashlyn Bruning PA-C. I left my extension [phone] in case patient needs anything. Patient verbalized understanding of information. ? ?Patient contact- [phone] ?

## 2021-11-10 ENCOUNTER — Ambulatory Visit
Admission: RE | Admit: 2021-11-10 | Discharge: 2021-11-10 | Disposition: A | Discharge status: Home / Self Care | Payer: 59 | Source: Ambulatory Visit | Attending: Urology | Admitting: Urology

## 2021-11-10 DIAGNOSIS — C649 Malignant neoplasm of unspecified kidney, except renal pelvis: Secondary | ICD-10-CM

## 2021-11-10 NOTE — Progress Notes (Signed)
?Radiation Oncology         [phone] ?________________________________ ? ?Name: JAMELAH SITZER MRN: [id number]  ?Date: 11/10/2021  DOB: 1960/08/03 ? ?Post Treatment Note ? ?CC: [name], MD  [name], MD ? 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 ?Interval Since Last Radiation:  5 weeks  ?10/04/21: The oligometastatic lesion at T6 was treated to 18 Gy in a single fraction while a secondary lower prescription dose of 15 Gy was delivered to the entirety of each directly involved marrow compartment  ? ? ?Narrative:  I spoke with the patient to conduct her routine scheduled 1 month follow up visit via telephone to spare the patient unnecessary potential exposure in the healthcare setting during the current COVID-19 pandemic.  The patient was notified in advance and gave permission to proceed with this visit format. ? ?She tolerated the stereotactic radiosurgery well with only modest fatigue.                             ? ?On review of systems, the patient states that she is doing well in general.  She has had significant improvement in her back pain and is no longer requiring pain medications for that.  However, unfortunately, she had a recent fall at home and sustained to hairline fractures in her left humerus but did not require ORIF.  She is under the care of Dr. Aline Brochure and is recovering well, in a sling for immobilization but had a good follow-up visit this week demonstrating excellent healing so she is getting ready to start some physical therapy next week.  She reports that her energy level is gradually improving and overall, she is quite pleased with the progress to date. ? ?ALLERGIES:  has No Known Allergies. ? ?Meds: ?Current Outpatient Medications  ?Medication Sig Dispense Refill  ? acetaminophen (TYLENOL) 325 MG tablet Take 2 tablets (650 mg total) by mouth every 6 (six) hours as needed for moderate pain. 30 tablet 0  ?  HYDROcodone-acetaminophen (NORCO/VICODIN) 5-325 MG tablet Take 1 tablet by mouth every 6 (six) hours as needed for moderate pain. 30 tablet 0  ? Multiple Vitamin (MULITIVITAMIN WITH MINERALS) TABS Take 1 tablet by mouth daily.    ? polyethylene glycol-electrolytes (TRILYTE) 420 g solution Take 4,000 mLs by mouth as directed. 4000 mL 0  ? pregabalin (LYRICA) 50 MG capsule Take 50 mg by mouth 3 (three) times daily.    ? ?No current facility-administered medications for this encounter.  ? ? ?Physical Findings: ? vitals were not taken for this visit.  ?Pain Assessment ?Pain Score: 0-No pain/10 ?Unable to assess due to telephone follow-up visit format. ? ?Lab Findings: ?Lab Results  ?Component Value Date  ? WBC 4.5 10/23/2021  ? HGB 11.2 (L) 10/23/2021  ? HCT 36.1 10/23/2021  ? MCV 90.5 10/23/2021  ? PLT 178 10/23/2021  ? ? ? ?Radiographic Findings: ?DG Shoulder Left ? ?Result Date: 11/10/2021 ?Left shoulder imaging Fracture left proximal humerus As noted in prior x-rays the head is depressed there is no dislocation or angulation of the fracture fragments Impression stable fracture proximal humerus left shoulder ? ?DG Shoulder Left ? ?Result Date: 10/31/2021 ?X-rays left shoulder Follow-up proximal humerus fracture X-rays compared to previous films show no displacement angulation or worsening of the humeral head fracture Impression stable fracture left humeral head ? ?DG Shoulder Left ? ?Result Date: 10/23/2021 ?CLINICAL DATA:  Patient fell the kitchen.  Left shoulder injury. EXAM: LEFT SHOULDER - 2+ VIEW COMPARISON:  None. FINDINGS: Two views study shows cortical disruption in the humeral head with trabecular disruption in the region of the surgical neck. Bones are diffusely demineralized. IMPRESSION: Imaging features highly suspicious for nondisplaced tumoral head/neck fracture. Consider CT imaging to further assess as clinically warranted. Electronically Signed   By: Misty Stanley M.D.   On: 10/23/2021 13:58    ? ?Impression/Plan: ?1.  61 yo woman with isolated T6-T7 oligometastasis from right renal cell carcinoma. ?She appears to have recovered well from the effects of her recent stereotactic radiosurgery and is currently without complaints.  She has a scheduled follow-up visit with Dr. Alen Blew on 11/18/2021 with plans to start [place] at that time.  We discussed the plan to obtain a follow-up MRI thoracic spine in June 2023 to assess treatment response and pending this scan is stable, we will plan to repeat a thoracic MRI scan in 6 months to continue to monitor for any evidence of disease recurrence or progression.  She appears to have a good understanding of these recommendations and is comfortable and in agreement with the stated plan.  I will plan to follow-up with her by telephone following each scan to review results and recommendations from the multidisciplinary brain and spine tumor conference.  She knows that she is welcome to call anytime in the interim with any questions or concerns related to radiation. ? ? ? ?Nicholos Johns, PA-C  ?

## 2021-11-11 ENCOUNTER — Other Ambulatory Visit: Payer: Self-pay | Admitting: Radiation Therapy

## 2021-11-11 DIAGNOSIS — D492 Neoplasm of unspecified behavior of bone, soft tissue, and skin: Secondary | ICD-10-CM

## 2021-11-11 NOTE — Progress Notes (Signed)
Pharmacist Chemotherapy Monitoring - Initial Assessment   ? ?Anticipated start date: 11/18/21  ? ?The following has been reviewed per standard work regarding the patient's treatment regimen: ?The patient's diagnosis, treatment plan and drug doses, and organ/hematologic function ?Lab orders and baseline tests specific to treatment regimen  ?The treatment plan start date, drug sequencing, and pre-medications ?Prior authorization status  ?Patient's documented medication list, including drug-drug interaction screen and prescriptions for anti-emetics and supportive care specific to the treatment regimen ?The drug concentrations, fluid compatibility, administration routes, and timing of the medications to be used ?The patient's access for treatment and lifetime cumulative dose history, if applicable  ?The patient's medication allergies and previous infusion related reactions, if applicable  ? ?Changes made to treatment plan:  ?N/A ? ?Follow up needed:  ?Pending authorization for treatment  ? ? ?Dana Hanson, [place], BCPS, BCOP ?11/11/2021  12:12 PM  ?

## 2021-11-18 ENCOUNTER — Inpatient Hospital Stay: Payer: 59

## 2021-11-18 ENCOUNTER — Other Ambulatory Visit: Payer: Self-pay

## 2021-11-18 ENCOUNTER — Inpatient Hospital Stay: Payer: 59 | Attending: Oncology | Admitting: Oncology

## 2021-11-18 VITALS — BP 101/60 | HR 83 | Temp 98.0°F | Resp 17 | Ht 67.0 in | Wt 167.5 lb

## 2021-11-18 DIAGNOSIS — C7951 Secondary malignant neoplasm of bone: Secondary | ICD-10-CM | POA: Diagnosis not present

## 2021-11-18 DIAGNOSIS — C641 Malignant neoplasm of right kidney, except renal pelvis: Secondary | ICD-10-CM | POA: Diagnosis not present

## 2021-11-18 DIAGNOSIS — Z79899 Other long term (current) drug therapy: Secondary | ICD-10-CM | POA: Diagnosis not present

## 2021-11-18 DIAGNOSIS — C649 Malignant neoplasm of unspecified kidney, except renal pelvis: Secondary | ICD-10-CM | POA: Diagnosis present

## 2021-11-18 DIAGNOSIS — Z5112 Encounter for antineoplastic immunotherapy: Secondary | ICD-10-CM | POA: Insufficient documentation

## 2021-11-18 LAB — TSH: TSH: 1.401 u[IU]/mL (ref 0.350–4.500)

## 2021-11-18 LAB — CBC WITH DIFFERENTIAL (CANCER CENTER ONLY)
Abs Immature Granulocytes: 0.01 10*3/uL (ref 0.00–0.07)
Basophils Absolute: 0.1 10*3/uL (ref 0.0–0.1)
Basophils Relative: 1 %
Eosinophils Absolute: 0.1 10*3/uL (ref 0.0–0.5)
Eosinophils Relative: 3 %
HCT: 39 % (ref 36.0–46.0)
Hemoglobin: 12.4 g/dL (ref 12.0–15.0)
Immature Granulocytes: 0 %
Lymphocytes Relative: 36 %
Lymphs Abs: 1.5 10*3/uL (ref 0.7–4.0)
MCH: 28.3 pg (ref 26.0–34.0)
MCHC: 31.8 g/dL (ref 30.0–36.0)
MCV: 89 fL (ref 80.0–100.0)
Monocytes Absolute: 0.3 10*3/uL (ref 0.1–1.0)
Monocytes Relative: 8 %
Neutro Abs: 2.1 10*3/uL (ref 1.7–7.7)
Neutrophils Relative %: 52 %
Platelet Count: 216 10*3/uL (ref 150–400)
RBC: 4.38 MIL/uL (ref 3.87–5.11)
RDW: 13.5 % (ref 11.5–15.5)
WBC Count: 4.1 10*3/uL (ref 4.0–10.5)
nRBC: 0 % (ref 0.0–0.2)

## 2021-11-18 LAB — CMP (CANCER CENTER ONLY)
ALT: 12 U/L (ref 0–44)
AST: 19 U/L (ref 15–41)
Albumin: 4.3 g/dL (ref 3.5–5.0)
Alkaline Phosphatase: 73 U/L (ref 38–126)
Anion gap: 8 (ref 5–15)
BUN: 23 mg/dL — ABNORMAL HIGH (ref 6–20)
CO2: 26 mmol/L (ref 22–32)
Calcium: 9.7 mg/dL (ref 8.9–10.3)
Chloride: 108 mmol/L (ref 98–111)
Creatinine: 1.01 mg/dL — ABNORMAL HIGH (ref 0.44–1.00)
GFR, Estimated: >60 mL/min (ref >60)
Glucose, Bld: 99 mg/dL (ref 70–99)
Potassium: 3.9 mmol/L (ref 3.5–5.1)
Sodium: 142 mmol/L (ref 135–145)
Total Bilirubin: 0.7 mg/dL (ref 0.3–1.2)
Total Protein: 7.5 g/dL (ref 6.5–8.1)

## 2021-11-18 MED ORDER — SODIUM CHLORIDE 0.9 % IV SOLN
200.0000 mg | Freq: Once | INTRAVENOUS | Status: AC
  Administered 2021-11-18: 200 mg via INTRAVENOUS
  Filled 2021-11-18: qty 200

## 2021-11-18 MED ORDER — SODIUM CHLORIDE 0.9 % IV SOLN: Freq: Once | INTRAVENOUS | Status: AC

## 2021-11-18 NOTE — Progress Notes (Signed)
Hematology and Oncology Follow Up Visit ? ?[place] ?[id number] ?1961-03-03 61 y.o. ?11/18/2021 10:26 AM ?[name], MDFagan, [name], MD  ? ?Principle Diagnosis: 61 year old woman with stage Ib clear-cell renal cell carcinoma diagnosed in 2017.  She developed stage IV disease including T6 vertebral body metastasis in January 2023.  ? ? ?Prior Therapy: ? ?She is status post laparoscopic right radical nephrectomy performed in February 2017.  She was found to have 6.5 cm clear-cell renal cell carcinoma. ? ?She is status post surgical biopsy completed by Dr. Christella Noa on September 09, 2021 of the T6 spinal lesion which confirmed the presence of metastatic renal cell carcinoma. ? ?She is status post spinal stereotactic radiosurgery completed on September 27, 2021 for isolated metastatic lesion of the T6 spine. ? ? ?Current therapy: Pembrolizumab 200 mg every 3 weeks cycle 1 on Nov 18, 2021. ? ?Interim History: Ms. Lagunes presents today for return evaluation.  Since last visit, she reports no major complaints.  She did sustain a fall and had a fractured shoulder on the left.  She is currently recovering without any issues.  She did not require any surgical intervention.  She is anticipating physical therapy in the near future.  She does report some pain and tenderness in the left shoulder.  She denies any back pain or discomfort.  She is attended to activities of daily living without any issues. ? ? ? ? ?Medications: Reviewed without changes. ?Current Outpatient Medications  ?Medication Sig Dispense Refill  ? acetaminophen (TYLENOL) 325 MG tablet Take 2 tablets (650 mg total) by mouth every 6 (six) hours as needed for moderate pain. 30 tablet 0  ? HYDROcodone-acetaminophen (NORCO/VICODIN) 5-325 MG tablet Take 1 tablet by mouth every 6 (six) hours as needed for moderate pain. 30 tablet 0  ? Multiple Vitamin (MULITIVITAMIN WITH MINERALS) TABS Take 1 tablet by mouth daily.    ? polyethylene glycol-electrolytes (TRILYTE) 420 g  solution Take 4,000 mLs by mouth as directed. 4000 mL 0  ? pregabalin (LYRICA) 50 MG capsule Take 50 mg by mouth 3 (three) times daily.    ? ?No current facility-administered medications for this visit.  ? ? ? ?Allergies: No Known Allergies ? ? ? ?Physical Exam: ?Blood pressure 101/60, pulse 83, temperature 98 ?F (36.7 ?C), temperature source Temporal, resp. rate 17, height '5\' 7"'$  (1.702 m), weight 167 lb 8 oz (76 kg), last menstrual period 10/27/2013, SpO2 100 %. ? ?ECOG:  ? ? ? ? ?General appearance: Alert, awake without any distress. ?Head: Atraumatic without abnormalities ?Oropharynx: Without any thrush or ulcers. ?Eyes: No scleral icterus. ?Lymph nodes: No lymphadenopathy noted in the cervical, supraclavicular, or axillary nodes ?Heart:regular rate and rhythm, without any murmurs or gallops.   ?Lung: Clear to auscultation without any rhonchi, wheezes or dullness to percussion. ?Abdomin: Soft, nontender without any shifting dullness or ascites. ?Musculoskeletal: No clubbing or cyanosis. ?Neurological: No motor or sensory deficits. ?Skin: No rashes or lesions. ? ? ? ? ? ? ?Lab Results: ?Lab Results  ?Component Value Date  ? WBC 4.1 11/18/2021  ? HGB 12.4 11/18/2021  ? HCT 39.0 11/18/2021  ? MCV 89.0 11/18/2021  ? PLT 216 11/18/2021  ? ?  Chemistry   ?   ?Component Value Date/Time  ? NA 142 11/18/2021 0950  ? K 3.9 11/18/2021 0950  ? CL 108 11/18/2021 0950  ? CO2 26 11/18/2021 0950  ? BUN 23 (H) 11/18/2021 0950  ? CREATININE 1.01 (H) 11/18/2021 0950  ?    ?Component  Value Date/Time  ? CALCIUM 9.7 11/18/2021 0950  ? ALKPHOS 73 11/18/2021 0950  ? AST 19 11/18/2021 0950  ? ALT 12 11/18/2021 0950  ? BILITOT 0.7 11/18/2021 0950  ?  ? ? ? ? ? ? ?Impression and Plan: ? ?61 year old with: ? ?1.  Kidney cancer diagnosed in 2017.  She developed stage IV clear-cell renal cell carcinoma with isolated metastatic lesion to the thoracic spine that received adequate treatment. ? ?She is currently under consideration to start  adjuvant Pembrolizumab for her stage IV treated disease.  Risks and benefits associated with this treatment were discussed at this time.  Potential complications that include arthralgias, myalgias, GI toxicity and autoimmune concerns were reviewed.  She is agreeable to proceed at this time. ? ? ?2.  Thoracic spine metastasis: No evidence of relapsed disease at this time after radiation therapy.  She is to repeat MRI in the future. ? ?3.  Autoimmune complications: These considerations were reiterated at this time including thyroid disease, hepatitis, hypophysitis. ? ? ?4.  Follow-up: In 3 weeks for the next cycle of therapy. ? ?30  minutes were spent on this encounter.  The time was dedicated to reviewing laboratory data, disease status update, treatment choices and addressing complications related to cancer and cancer therapy. ? ? ? ?Zola Button, MD ?5/5/202310:26 AM ? ?

## 2021-11-18 NOTE — Patient Instructions (Addendum)
[facility]   ?Discharge Instructions: ?Thank you for choosing [facility] to provide your oncology and hematology care.  ? ?If you have a lab appointment with the [facility], please go directly to the [facility] and check in at the registration area. ?  ?Wear comfortable clothing and clothing appropriate for easy access to any Portacath or PICC line.  ? ?We strive to give you quality time with your provider. You may need to reschedule your appointment if you arrive late (15 or more minutes).  Arriving late affects you and other patients whose appointments are after yours.  Also, if you miss three or more appointments without notifying the office, you may be dismissed from the clinic at the provider?s discretion.    ?  ?For prescription refill requests, have your pharmacy contact our office and allow 72 hours for refills to be completed.   ? ?Today you received the following chemotherapy and/or immunotherapy agents: pembrolizumab    ?  ?To help prevent nausea and vomiting after your treatment, we encourage you to take your nausea medication as directed. ? ?BELOW ARE SYMPTOMS THAT SHOULD BE REPORTED IMMEDIATELY: ?*FEVER GREATER THAN 100.4 F (38 ?C) OR HIGHER ?*CHILLS OR SWEATING ?*NAUSEA AND VOMITING THAT IS NOT CONTROLLED WITH YOUR NAUSEA MEDICATION ?*UNUSUAL SHORTNESS OF BREATH ?*UNUSUAL BRUISING OR BLEEDING ?*URINARY PROBLEMS (pain or burning when urinating, or frequent urination) ?*BOWEL PROBLEMS (unusual diarrhea, constipation, pain near the anus) ?TENDERNESS IN MOUTH AND THROAT WITH OR WITHOUT PRESENCE OF ULCERS (sore throat, sores in mouth, or a toothache) ?UNUSUAL RASH, SWELLING OR PAIN  ?UNUSUAL VAGINAL DISCHARGE OR ITCHING  ? ?Items with * indicate a potential emergency and should be followed up as soon as possible or go to the Emergency Department if any problems should occur. ? ?Please show the CHEMOTHERAPY ALERT CARD or IMMUNOTHERAPY ALERT CARD at  check-in to the Emergency Department and triage nurse. ? ?Should you have questions after your visit or need to cancel or reschedule your appointment, please contact [facility]  Dept: [phone]  and follow the prompts.  Office hours are 8:00 a.m. to 4:30 p.m. Monday - Friday. Please note that voicemails left after 4:00 p.m. may not be returned until the following business day.  We are closed weekends and major holidays. You have access to a nurse at all times for urgent questions. Please call the main number to the clinic Dept: [phone] and follow the prompts. ? ? ?For any non-urgent questions, you may also contact your provider using MyChart. We now offer e-Visits for anyone 63 and older to request care online for non-urgent symptoms. For details visit [email or web address]. ?  ?Also download the MyChart app! Go to the app store, search "MyChart", open the app, select [facility], and log in with your MyChart username and password. ? ?Due to Covid, a mask is required upon entering the hospital/clinic. If you do not have a mask, one will be given to you upon arrival. For doctor visits, patients may have 1 support person aged 40 or older with them. For treatment visits, patients cannot have anyone with them due to current Covid guidelines and our immunocompromised population.  ? ? ?Pembrolizumab injection ?What is this medication? ?PEMBROLIZUMAB (pem broe liz ue mab) is a monoclonal antibody. It is used to treat certain types of cancer. ?This medicine may be used for other purposes; ask your health care provider or pharmacist if you have questions. ?COMMON BRAND NAME(S):  Keytruda ?What should I tell my care team before I take this medication? ?They need to know if you have any of these conditions: ?autoimmune diseases like Crohn's disease, ulcerative colitis, or lupus ?have had or planning to have an allogeneic stem cell transplant (uses someone else's stem cells) ?history of  organ transplant ?history of chest radiation ?nervous system problems like myasthenia gravis or Guillain-Barre syndrome ?an unusual or allergic reaction to pembrolizumab, other medicines, foods, dyes, or preservatives ?pregnant or trying to get pregnant ?breast-feeding ?How should I use this medication? ?This medicine is for infusion into a vein. It is given by a health care professional in a hospital or clinic setting. ?A special MedGuide will be given to you before each treatment. Be sure to read this information carefully each time. ?Talk to your pediatrician regarding the use of this medicine in children. While this drug may be prescribed for children as young as 6 months for selected conditions, precautions do apply. ?Overdosage: If you think you have taken too much of this medicine contact a poison control center or emergency room at once. ?NOTE: This medicine is only for you. Do not share this medicine with others. ?What if I miss a dose? ?It is important not to miss your dose. Call your doctor or health care professional if you are unable to keep an appointment. ?What may interact with this medication? ?Interactions have not been studied. ?This list may not describe all possible interactions. Give your health care provider a list of all the medicines, herbs, non-prescription drugs, or dietary supplements you use. Also tell them if you smoke, drink alcohol, or use illegal drugs. Some items may interact with your medicine. ?What should I watch for while using this medication? ?Your condition will be monitored carefully while you are receiving this medicine. ?You may need blood work done while you are taking this medicine. ?Do not become pregnant while taking this medicine or for 4 months after stopping it. Women should inform their doctor if they wish to become pregnant or think they might be pregnant. There is a potential for serious side effects to an unborn child. Talk to your health care professional or  pharmacist for more information. Do not breast-feed an infant while taking this medicine or for 4 months after the last dose. ?What side effects may I notice from receiving this medication? ?Side effects that you should report to your doctor or health care professional as soon as possible: ?allergic reactions like skin rash, itching or hives, swelling of the face, lips, or tongue ?bloody or black, tarry ?breathing problems ?changes in vision ?chest pain ?chills ?confusion ?constipation ?cough ?diarrhea ?dizziness or feeling faint or lightheaded ?fast or irregular heartbeat ?fever ?flushing ?joint pain ?low blood counts - this medicine may decrease the number of white blood cells, red blood cells and platelets. You may be at increased risk for infections and bleeding. ?muscle pain ?muscle weakness ?pain, tingling, numbness in the hands or feet ?persistent headache ?redness, blistering, peeling or loosening of the skin, including inside the mouth ?signs and symptoms of high blood sugar such as dizziness; dry mouth; dry skin; fruity breath; nausea; stomach pain; increased hunger or thirst; increased urination ?signs and symptoms of kidney injury like trouble passing urine or change in the amount of urine ?signs and symptoms of liver injury like dark urine, light-colored stools, loss of appetite, nausea, right upper belly pain, yellowing of the eyes or skin ?sweating ?swollen lymph nodes ?weight loss ?Side effects that usually do not  require medical attention (report to your doctor or health care professional if they continue or are bothersome): ?decreased appetite ?hair loss ?tiredness ?This list may not describe all possible side effects. Call your doctor for medical advice about side effects. You may report side effects to FDA at 1-800-FDA-1088. ?Where should I keep my medication? ?This drug is given in a hospital or clinic and will not be stored at home. ?NOTE: This sheet is a summary. It may not cover all possible  information. If you have questions about this medicine, talk to your doctor, pharmacist, or health care provider. ?? 2023 Elsevier/Gold Standard (2021-06-03 00:00:00) ? ?

## 2021-11-23 ENCOUNTER — Telehealth: Payer: Self-pay | Admitting: Oncology

## 2021-11-23 NOTE — Telephone Encounter (Signed)
Called patient regarding upcoming appointment, left a voicemail. 

## 2021-11-29 ENCOUNTER — Ambulatory Visit (HOSPITAL_COMMUNITY): Payer: 59 | Attending: Orthopedic Surgery

## 2021-11-29 ENCOUNTER — Encounter (HOSPITAL_COMMUNITY): Payer: Self-pay

## 2021-11-29 DIAGNOSIS — R29898 Other symptoms and signs involving the musculoskeletal system: Secondary | ICD-10-CM | POA: Diagnosis present

## 2021-11-29 DIAGNOSIS — M25512 Pain in left shoulder: Secondary | ICD-10-CM | POA: Diagnosis present

## 2021-11-29 DIAGNOSIS — M25612 Stiffness of left shoulder, not elsewhere classified: Secondary | ICD-10-CM | POA: Insufficient documentation

## 2021-11-29 NOTE — Therapy (Signed)
?OUTPATIENT OCCUPATIONAL THERAPY ORTHO EVALUATION ? ?Patient Name: Dana Hanson ?MRN: [id number] ?DOB:07-13-1961, 61 y.o., female ?Today's Date: 11/29/2021 ? ?PCP: [name], MD ?REFERRING PROVIDER: Arther Abbott, MD ? ? OT End of Session - 11/29/21 1342   ? ? Visit Number 1   ? Number of Visits 12   ? Date for OT Re-Evaluation 01/10/22   ? Authorization Type Generic [employer]   ? Authorization Time Period $40 copay,no visit limit   ? OT Start Time 1300   ? OT Stop Time 1338   ? OT Time Calculation (min) 38 min   ? Activity Tolerance Patient tolerated treatment well   ? Behavior During Therapy [facility] for tasks assessed/performed   ? ?  ?  ? ?  ? ? ?Past Medical History:  ?Diagnosis Date  ? Cancer [facility])   ? kidney  ? Gallstone pancreatitis   ? Hyperlipidemia   ? IBS (irritable bowel syndrome)   ? patient denies  ? Right renal mass   ? ?Past Surgical History:  ?Procedure Laterality Date  ? CESAREAN SECTION    ? x 2-tubal with last c-section  ? CHOLECYSTECTOMY N/A 09/03/2015  ? Procedure: LAPAROSCOPIC CHOLECYSTECTOMY WITH INTRAOPERATIVE CHOLANGIOGRAM;  Surgeon: Mickeal Skinner, MD;  [facility];  Service: General;  Laterality: N/A;  ? COLONOSCOPY  08/14/2011  ? Surgeon: Daneil Dolin, MD; external hemorrhoid tags, likely source of hematochezia, normal rectum, multiple colonic polyps at the base of the cecum, all less than 5 mm, single 4 mm sigmoid polyp, scattered diverticula.  Pathology revealed tubular adenoma in the cecum, hyperplastic polyp in the sigmoid colon.  Recommended repeat colonoscopy in 3 years.  ? HEMORRHOID SURGERY  11/10/2011  ? Procedure: HEMORRHOIDECTOMY;  Surgeon: Donato Heinz, MD;  [facility];  Service: General;  Laterality: N/A;  ? [place] RESECTION  01/15/2015  ? LAPAROSCOPIC NEPHRECTOMY N/A 09/03/2015  ? Procedure: LAPAROSCOPIC RADICAL NEPHRECTOMY;  Surgeon: Ardis Hughs, MD;  [facility];  Service: Urology;  Laterality: N/A;  ? TUBAL LIGATION     ? with last c-section  ? VERTEBROPLASTY N/A 09/09/2021  ? Procedure: Thoracic vertebral biopsy;  Surgeon: Ashok Pall, MD;  Location: [place];  Service: Neurosurgery;  Laterality: N/A;  RM 21  ? ?Patient Active Problem List  ? Diagnosis Date Noted  ? History of colonic polyps 10/12/2021  ? Abnormal PET scan of colon 10/12/2021  ? Metastatic renal cell carcinoma to bone ([place]) 09/27/2021  ? Cancer of right kidney ([place]) 09/03/2015  ? Renal mass 09/03/2015  ? Pancreatitis 08/21/2015  ? Biliary obstruction   ? Primary osteoarthritis of right knee 03/17/2014  ? Hemorrhoids 08/01/2011  ? Heme positive stool 08/01/2011  ? Rectal bleed 08/01/2011  ? ? ?ONSET DATE: 10/30/21 ? ?REFERRING DIAG: left shoulder proximal humerus fracture ? ?THERAPY DIAG:  ?Other symptoms and signs involving the musculoskeletal system ? ?Pain in joint of left shoulder ? ?Stiffness of left shoulder joint ? ?SUBJECTIVE:  ? ?SUBJECTIVE STATEMENT: ?S: I have weaned myself out of the sling. I felt like I was getting locked up and I didn't want that to happen. ?Pt accompanied by: self ? ?PERTINENT HISTORY: Patient presenting with a left closed nondisplaced proximal humerus fracture sustained from a mechanical fall on 10/30/21. She was placed in a sling and reports that she has weaned herself from it. Today she is 4 weeks post injury. Next follow up appointment with Dr. Aline Brochure: 12/19/21. ? ?PRECAUTIONS: Shoulder ?Standard protocol: Week 3-5 (  5/7-5/21) AA/ROM (when pain is diminished and pt is less apprehensive). Week 6-8 (5/28-6/11) A/ROM, isometrics, Week 8-10 early strengthening ? ?WEIGHT BEARING RESTRICTIONS Yes NWB LUE ? ?PAIN:  ?Are you having pain? Yes: NPRS scale: 3/10 ?Pain location: left shoulder ?Pain description: constant, ache ?Aggravating factors: increased use, movement, dressing ?Relieving factors: Tylenol, gel ice pack, sling PRN ?Pain level increases with use and activity. ? ?FALLS: Has patient fallen in last 6 months? Yes. Number of falls  1 ? ?LIVING ENVIRONMENT: ?Lives with: lives with their son ? ? ?PLOF: Independent and Vocation/Vocational requirements: Works remotely Optometrist - computer work ? ?PATIENT GOALS To return to using her left arm as normally as possible.  ? ?OBJECTIVE:  ? ?HAND DOMINANCE: Left ? ?ADLs: ?Overall ADLs: Difficulty completing all activities requiring movement and use of the LUE.  ? ? ?FUNCTIONAL OUTCOME MEASURES: ?FOTO: 50/100 ? ?UE ROM    ? ?Active ROM - seated. IR/er adducted Left ?11/29/2021  ?Shoulder flexion 102  ?Shoulder abduction 85  ?Shoulder internal rotation 85  ?Shoulder external rotation 55  ?(Blank rows = not tested) ? ?Passive ROM - supine. IR/er adducted Left ?11/29/2021  ?Shoulder flexion 125  ?Shoulder abduction 91  ?Shoulder internal rotation 90  ?Shoulder external rotation 62  ?(Blank rows = not tested) ? ? ?UE MMT:    ? ?MMT - seated. IR/er adducted Left ?11/29/2021  ?Shoulder flexion 3-/5  ?Shoulder abduction 3-/5  ?Shoulder internal rotation 3/5  ?Shoulder external rotation 3-/5  ?(Blank rows = not tested) ? ? ? ?COGNITION: ?Overall cognitive status: Within functional limits for tasks assessed ? ? ?OBSERVATIONS: Moderate fascial restrictions noted in the left upper trap and scapular region ? ? ? ? ? ?PATIENT EDUCATION: ?Education details: table slides ?Person educated: Patient ?Education method: Explanation, Demonstration, Verbal cues, and Handouts ?Education comprehension: verbalized understanding and returned demonstration ? ? ?HOME EXERCISE PROGRAM: ?Eval: table slides ? ?GOALS: ? ? ?SHORT TERM GOALS: Target date: 12/20/2021  ? ?Patient will be educated and independent with HEP in order to facilitate her progress in therapy and allow her to return to using her left arm as her dominant extremity for all daily tasks.  ?Baseline: ?Goal status: INITIAL ? ?2.  Patient will increase her LUE P/ROM to [facility] in order to increase the ability to complete dressing tasks with less difficulty.  ?Baseline:  ?Goal  status: INITIAL ? ?3.  Patient will increase her LUE strength to 3+/5 in order to increase her ability to complete reaching tasks at or above shoulder level with less difficulty.  ?Baseline:  ?Goal status: INITIAL ? ?4.  Patient will decrease her LUE fascial restrictions to min amount or less in order to increase the functional mobility needed to complete reaching tasks.  ?Baseline:  ?Goal status: INITIAL ? ? ? ?LONG TERM GOALS: Target date: 01/10/2022   ? ?Patient will increase her LUE A/ROM to [facility] in order to complete high level reaching tasks with less difficulty. ?Baseline:  ?Goal status: INITIAL ? ?2.  Patient will increase her LUE strength to 4+/5 or better in order to return to using her LUE as her dominant extremity while managing moderate weighted items. ?Baseline:  ?Goal status: INITIAL ? ?3.  Patient will report a pain level of 2/10 or less in her LUE while utilizing it to complete basic ADL tasks.  ?Baseline:  ?Goal status: INITIAL ? ? ? ?ASSESSMENT: ? ?CLINICAL IMPRESSION: ?Patient is a 61 y.o. female who was seen today for occupational therapy evaluation for  left proximal humerus fracture causing increased pain, fascial restrictions, and decrease strength and ROM resulting severe difficulty utilizing it as her dominant extremity to complete ADL tasks. .  ? ?PERFORMANCE DEFICITS in functional skills including ADLs, IADLs, ROM, strength, pain, fascial restrictions, mobility, decreased knowledge of precautions, and UE functional use. ? ?IMPAIRMENTS are limiting patient from ADLs, IADLs, and leisure.  ? ?COMORBIDITIES has co-morbidities such as history of kidney cancer (2017) with stage 4 body metastasis T6 vertebral (07/2021). Radiation completed. Receives infusion medication every 3 months  that affects occupational performance. Patient will benefit from skilled OT to address above impairments and improve overall function. ? ?MODIFICATION OR ASSISTANCE TO COMPLETE EVALUATION: Min-Moderate modification of  tasks or assist with assess necessary to complete an evaluation. ? ?OT OCCUPATIONAL PROFILE AND HISTORY: Problem focused assessment: Including review of records relating to presenting problem. ? ?CL[place]ICA

## 2021-11-29 NOTE — Patient Instructions (Signed)
Complete 2-3 times a day if possible. ? ? ?1) SHOULDER: Flexion On Table ? ? ?Place hands on towel placed on table, elbows straight. Lean forward with you upper body, pushing towel away from body.  __10-15_ reps per set, 2) Abduction (Passive) ? ? ?With arm out to side, resting on towel placed on table with palm DOWN, keeping trunk away from table, lean to the side while pushing towel away from body.  ?Repeat __10-15__ times.  ? ?Copyright ? VHI. All rights reserved.  ? ? ? ?3) Internal Rotation (Assistive) ? ? ?Seated with elbow bent at right angle and held against side, slide arm on table surface in an inward arc keeping elbow anchored in place. ?Repeat __10-15__ times.  ?Activity: Use this motion to brush crumbs off the table. ? ?Copyright ? VHI. All rights reserved.   ?

## 2021-11-30 ENCOUNTER — Ambulatory Visit (HOSPITAL_COMMUNITY): Payer: 59 | Admitting: Anesthesiology

## 2021-11-30 ENCOUNTER — Encounter (HOSPITAL_COMMUNITY)
Admission: RE | Disposition: A | Discharge status: Home / Self Care | Payer: Self-pay | Source: Ambulatory Visit | Attending: Internal Medicine

## 2021-11-30 ENCOUNTER — Other Ambulatory Visit: Payer: Self-pay

## 2021-11-30 ENCOUNTER — Ambulatory Visit (HOSPITAL_BASED_OUTPATIENT_CLINIC_OR_DEPARTMENT_OTHER): Payer: 59 | Admitting: Anesthesiology

## 2021-11-30 ENCOUNTER — Encounter (HOSPITAL_COMMUNITY): Payer: Self-pay | Admitting: Internal Medicine

## 2021-11-30 ENCOUNTER — Ambulatory Visit (HOSPITAL_COMMUNITY)
Admission: RE | Admit: 2021-11-30 | Discharge: 2021-11-30 | Disposition: A | Discharge status: Home / Self Care | Payer: 59 | Source: Ambulatory Visit | Attending: Internal Medicine | Admitting: Internal Medicine

## 2021-11-30 DIAGNOSIS — D120 Benign neoplasm of cecum: Secondary | ICD-10-CM | POA: Diagnosis not present

## 2021-11-30 DIAGNOSIS — C649 Malignant neoplasm of unspecified kidney, except renal pelvis: Secondary | ICD-10-CM

## 2021-11-30 DIAGNOSIS — M1711 Unilateral primary osteoarthritis, right knee: Secondary | ICD-10-CM

## 2021-11-30 DIAGNOSIS — K641 Second degree hemorrhoids: Secondary | ICD-10-CM | POA: Diagnosis not present

## 2021-11-30 DIAGNOSIS — N2889 Other specified disorders of kidney and ureter: Secondary | ICD-10-CM

## 2021-11-30 DIAGNOSIS — K5730 Diverticulosis of large intestine without perforation or abscess without bleeding: Secondary | ICD-10-CM

## 2021-11-30 DIAGNOSIS — K635 Polyp of colon: Secondary | ICD-10-CM

## 2021-11-30 DIAGNOSIS — K831 Obstruction of bile duct: Secondary | ICD-10-CM

## 2021-11-30 DIAGNOSIS — K648 Other hemorrhoids: Secondary | ICD-10-CM | POA: Diagnosis not present

## 2021-11-30 DIAGNOSIS — K625 Hemorrhage of anus and rectum: Secondary | ICD-10-CM

## 2021-11-30 DIAGNOSIS — Z905 Acquired absence of kidney: Secondary | ICD-10-CM | POA: Insufficient documentation

## 2021-11-30 DIAGNOSIS — R933 Abnormal findings on diagnostic imaging of other parts of digestive tract: Secondary | ICD-10-CM | POA: Diagnosis present

## 2021-11-30 DIAGNOSIS — Z86010 Personal history of colonic polyps: Secondary | ICD-10-CM | POA: Diagnosis not present

## 2021-11-30 DIAGNOSIS — K859 Acute pancreatitis without necrosis or infection, unspecified: Secondary | ICD-10-CM

## 2021-11-30 DIAGNOSIS — R948 Abnormal results of function studies of other organs and systems: Secondary | ICD-10-CM

## 2021-11-30 DIAGNOSIS — D124 Benign neoplasm of descending colon: Secondary | ICD-10-CM | POA: Diagnosis not present

## 2021-11-30 DIAGNOSIS — R195 Other fecal abnormalities: Secondary | ICD-10-CM

## 2021-11-30 DIAGNOSIS — C641 Malignant neoplasm of right kidney, except renal pelvis: Secondary | ICD-10-CM

## 2021-11-30 HISTORY — PX: POLYPECTOMY: SHX5525

## 2021-11-30 HISTORY — PX: COLONOSCOPY WITH PROPOFOL: SHX5780

## 2021-11-30 SURGERY — COLONOSCOPY WITH PROPOFOL
Anesthesia: General

## 2021-11-30 MED ORDER — PROPOFOL 10 MG/ML IV BOLUS
INTRAVENOUS | Status: DC | PRN
  Administered 2021-11-30: 60 mg via INTRAVENOUS

## 2021-11-30 MED ORDER — LACTATED RINGERS IV SOLN: INTRAVENOUS | Status: DC

## 2021-11-30 MED ORDER — PROPOFOL 500 MG/50ML IV EMUL
INTRAVENOUS | Status: AC
  Filled 2021-11-30: qty 50

## 2021-11-30 MED ORDER — PROPOFOL 500 MG/50ML IV EMUL
INTRAVENOUS | Status: DC | PRN
  Administered 2021-11-30: 150 ug/kg/min via INTRAVENOUS

## 2021-11-30 MED ORDER — STERILE WATER FOR IRRIGATION IR SOLN
Status: DC | PRN
  Administered 2021-11-30: .6 mL

## 2021-11-30 NOTE — Anesthesia Preprocedure Evaluation (Addendum)
Anesthesia Evaluation  ?Patient identified by MRN, date of birth, ID band ?Patient awake ? ? ? ?Reviewed: ?Allergy & Precautions, NPO status , Patient's Chart, lab work & pertinent test results ? ?Airway ?Mallampati: II ? ?TM Distance: >3 FB ?Neck ROM: Full ? ? ? Dental ? ?(+) Dental Advisory Given, Teeth Intact ?  ?Pulmonary ?neg pulmonary ROS,  ?  ?Pulmonary exam normal ?breath sounds clear to auscultation ? ? ? ? ? ? Cardiovascular ?negative cardio ROS ?Normal cardiovascular exam ?Rhythm:Regular Rate:Normal ? ? ?  ?Neuro/Psych ?negative neurological ROS ? negative psych ROS  ? GI/Hepatic ?Neg liver ROS, Gastric sleeve ?  ?Endo/Other  ?negative endocrine ROS ? Renal/GU ?Renal disease (renal cell carcinoma, nephrectomy)  ?negative genitourinary ?  ?Musculoskeletal ? ?(+) Arthritis , Osteoarthritis,  Mets to spine  ? Abdominal ?  ?Peds ?negative pediatric ROS ?(+)  Hematology ?negative hematology ROS ?(+)   ?Anesthesia Other Findings ?Left shoulder fracture  ? Reproductive/Obstetrics ?negative OB ROS ? ?  ? ? ? ? ? ? ? ? ? ? ? ? ? ?  ?  ? ? ? ? ? ? ? ?Anesthesia Physical ?Anesthesia Plan ? ?ASA: 3 ? ?Anesthesia Plan: General  ? ?Post-op Pain Management: Minimal or no pain anticipated  ? ?Induction: Intravenous ? ?PONV Risk Score and Plan: Propofol infusion ? ?Airway Management Planned: Nasal Cannula and Natural Airway ? ?Additional Equipment:  ? ?Intra-op Plan:  ? ?Post-operative Plan:  ? ?Informed Consent: I have reviewed the patients History and Physical, chart, labs and discussed the procedure including the risks, benefits and alternatives for the proposed anesthesia with the patient or authorized representative who has indicated his/her understanding and acceptance.  ? ? ? ?Dental advisory given ? ?Plan Discussed with: CRNA and Surgeon ? ?Anesthesia Plan Comments:   ? ? ? ? ? ?Anesthesia Quick Evaluation ? ?

## 2021-11-30 NOTE — Op Note (Signed)
[name] ?Patient Name: Dana Hanson ?Procedure Date: 11/30/2021 7:11 AM ?MRN: [id number] ?Date of Birth: 1960/09/16 ?Attending MD: Norvel Richards , MD ?CSN: [id number] ?Age: 61 ?Admit Type: Outpatient ?Procedure:                Colonoscopy ?Indications:              Abnormal PET scan of the GI tract ?Providers:                Norvel Richards, MD, Rosina Lowenstein, RN, Cathi Roan  ?                          Gloriann Loan, RN ?Referring MD:              ?Medicines:                Propofol per Anesthesia ?Complications:            No immediate complications. ?Estimated Blood Loss:     Estimated blood loss was minimal. ?Procedure:                Pre-Anesthesia Assessment: ?                          - Prior to the procedure, a History and Physical  ?                          was performed, and patient medications and  ?                          allergies were reviewed. The patient's tolerance of  ?                          previous anesthesia was also reviewed. The risks  ?                          and benefits of the procedure and the sedation  ?                          options and risks were discussed with the patient.  ?                          All questions were answered, and informed consent  ?                          was obtained. Prior Anticoagulants: The patient has  ?                          taken no previous anticoagulant or antiplatelet  ?                          agents. ASA Grade Assessment: III - A patient with  ?                          severe systemic disease. After reviewing the risks  ?  and benefits, the patient was deemed in  ?                          satisfactory condition to undergo the procedure. ?                          After obtaining informed consent, the colonoscope  ?                          was passed under direct vision. Throughout the  ?                          procedure, the patient's blood pressure, pulse, and  ?                          oxygen  saturations were monitored continuously. The  ?                          PCF-HQ190L ([id number]) scope was introduced through  ?                          the anus and advanced to the 10 cm into the ileum.  ?                          The colonoscopy was performed without difficulty.  ?                          The patient tolerated the procedure well. The  ?                          quality of the bowel preparation was adequate. ?Scope In: 7:48:16 AM ?Scope Out: 8:12:14 AM ?Scope Withdrawal Time: 0 hours 18 minutes 20 seconds  ?Total Procedure Duration: 0 hours 23 minutes 58 seconds  ?Findings: ?     Hemorrhoids were found on perianal exam. ?     Two sessile polyps were found in the descending colon and cecum. The  ?     polyps were 3 to 8 mm in size. These polyps were removed with a cold  ?     snare. Resection and retrieval were complete. Estimated blood loss was  ?     minimal. ?     Scattered medium-mouthed diverticula were found in the sigmoid colon.  ?     normal appearing distal 10 cm of TI ?     Non-bleeding internal hemorrhoids were found during retroflexion. The  ?     hemorrhoids were moderate, medium-sized and Grade II (internal  ?     hemorrhoids that prolapse but reduce spontaneously). ?     The exam was otherwise without abnormality on direct and retroflexion  ?     views. ?Impression:               - Hemorrhoids found on perianal exam. ?                          - Two 3 to 8 mm polyps in the descending colon and  ?  in the cecum, removed with a cold snare. Resected  ?                          and retrieved. ?                          - Diverticulosis in the sigmoid colon. ?                          - Non-bleeding internal hemorrhoids. ?                          - The examination was otherwise normal on direct  ?                          and retroflexion views. ?Moderate Sedation: ?     Moderate (conscious) sedation was personally administered by an  ?     anesthesia professional.  The following parameters were monitored: oxygen  ?     saturation, heart rate, blood pressure, respiratory rate, EKG, adequacy  ?     of pulmonary ventilation, and response to care. ?Recommendation:           - Patient has a contact number available for  ?                          emergencies. The signs and symptoms of potential  ?                          delayed complications were discussed with the  ?                          patient. Return to normal activities tomorrow.  ?                          Written discharge instructions were provided to the  ?                          patient. ?                          - Resume previous diet. ?                          - Continue present medications. ?                          - Repeat colonoscopy date to be determined after  ?                          pending pathology results are reviewed for  ?                          surveillance. ?                          - Return to GI office (date not yet determined). ?Procedure Code(s):        ---  Professional --- ?                          [phone], Colonoscopy, flexible; with removal of  ?                          tumor(s), polyp(s), or other lesion(s) by snare  ?                          technique ?Diagnosis Code(s):        --- Professional --- ?                          K64.1, Second degree hemorrhoids ?                          K63.5, Polyp of colon ?                          K57.30, Diverticulosis of large intestine without  ?                          perforation or abscess without bleeding ?                          R93.3, Abnormal findings on diagnostic imaging of  ?                          other parts of digestive tract ?CPT copyright 2019 American Medical Association. All rights reserved. ?The codes documented in this report are preliminary and upon coder review may  ?be revised to meet current compliance requirements. ?[name]. [name], MD ?Norvel Richards, MD ?11/30/2021 8:22:13 AM ?This report has been signed  electronically. ?Number of Addenda: 0 ?

## 2021-11-30 NOTE — Transfer of Care (Signed)
Immediate Anesthesia Transfer of Care Note ? ?Patient: Dana Hanson ? ?Procedure(s) Performed: COLONOSCOPY WITH PROPOFOL ?POLYPECTOMY ? ?Patient Location: PACU ? ?Anesthesia Type:General ? ?Level of Consciousness: awake, alert  and oriented ? ?Airway & Oxygen Therapy: Patient Spontanous Breathing ? ?Post-op Assessment: Report given to RN, Post -op Vital signs reviewed and stable, Patient moving all extremities X 4 and Patient able to stick tongue midline ? ?Post vital signs: Reviewed ? ?Last Vitals:  ?Vitals Value Taken Time  ?BP 152/94   ?Temp 97.5   ?Pulse 65   ?Resp 15   ?SpO2 100   ? ? ?Last Pain:  ?Vitals:  ? 11/30/21 0815  ?TempSrc: Oral  ?PainSc:   ?   ? ?Patients Stated Pain Goal: 8 (11/30/21 8003) ? ?Complications: No notable events documented. ?

## 2021-11-30 NOTE — Anesthesia Postprocedure Evaluation (Signed)
Anesthesia Post Note ? ?Patient: Dana Hanson ? ?Procedure(s) Performed: COLONOSCOPY WITH PROPOFOL ?POLYPECTOMY ? ?Patient location during evaluation: Endoscopy ?Anesthesia Type: General ?Level of consciousness: awake and alert and oriented ?Pain management: pain level controlled ?Vital Signs Assessment: post-procedure vital signs reviewed and stable ?Respiratory status: spontaneous breathing, nonlabored ventilation and respiratory function stable ?Cardiovascular status: blood pressure returned to baseline and stable ?Postop Assessment: no apparent nausea or vomiting ?Anesthetic complications: no ? ? ?No notable events documented. ? ? ?Last Vitals:  ?Vitals:  ? 11/30/21 0652 11/30/21 0815  ?BP: 127/76 (!) 152/94  ?Pulse: 75 79  ?Resp: 15 17  ?Temp: 36.6 ?C (!) 36.4 ?C  ?SpO2: 96% 99%  ?  ?Last Pain:  ?Vitals:  ? 11/30/21 0815  ?TempSrc: Oral  ?PainSc: 0-No pain  ? ? ?  ?  ?  ?  ?  ?  ? ?Manasseh Pittsley C Tayva Easterday ? ? ? ? ?

## 2021-11-30 NOTE — H&P (Signed)
$'@LOGO'U$ @ ? ? ?Primary Care Physician:  [name], MD ?Primary Gastroenterologist:  Dr. Gala Romney ? ?Pre-Procedure History & Physical: ?HPI:  Dana Hanson is a 61 y.o. female here for diagnostic colonoscopy.  PET scan hot at the end near the hepatic flexure.  Distant history of colonic adenomas; overdue for colonoscopy. ? ?Past Medical History:  ?Diagnosis Date  ? Cancer [facility])   ? kidney  ? Gallstone pancreatitis   ? Hyperlipidemia   ? IBS (irritable bowel syndrome)   ? patient denies  ? Right renal mass   ? ? ?Past Surgical History:  ?Procedure Laterality Date  ? CESAREAN SECTION    ? x 2-tubal with last c-section  ? CHOLECYSTECTOMY N/A 09/03/2015  ? Procedure: LAPAROSCOPIC CHOLECYSTECTOMY WITH INTRAOPERATIVE CHOLANGIOGRAM;  Surgeon: Mickeal Skinner, MD;  [facility];  Service: General;  Laterality: N/A;  ? COLONOSCOPY  08/14/2011  ? Surgeon: Daneil Dolin, MD; external hemorrhoid tags, likely source of hematochezia, normal rectum, multiple colonic polyps at the base of the cecum, all less than 5 mm, single 4 mm sigmoid polyp, scattered diverticula.  Pathology revealed tubular adenoma in the cecum, hyperplastic polyp in the sigmoid colon.  Recommended repeat colonoscopy in 3 years.  ? HEMORRHOID SURGERY  11/10/2011  ? Procedure: HEMORRHOIDECTOMY;  Surgeon: Donato Heinz, MD;  Location: [facility];  Service: General;  Laterality: N/A;  ? [place] RESECTION  01/15/2015  ? LAPAROSCOPIC NEPHRECTOMY N/A 09/03/2015  ? Procedure: LAPAROSCOPIC RADICAL NEPHRECTOMY;  Surgeon: Ardis Hughs, MD;  [facility];  Service: Urology;  Laterality: N/A;  ? TUBAL LIGATION    ? with last c-section  ? VERTEBROPLASTY N/A 09/09/2021  ? Procedure: Thoracic vertebral biopsy;  Surgeon: Ashok Pall, MD;  Location: [place];  Service: Neurosurgery;  Laterality: N/A;  RM 21  ? ? ?Prior to Admission medications   ?Medication Sig Start Date End Date Taking? Authorizing Provider  ?acetaminophen (TYLENOL) 325 MG  tablet Take 2 tablets (650 mg total) by mouth every 6 (six) hours as needed for moderate pain. 08/07/21  Yes [name], PA-C  ?HYDROcodone-acetaminophen (NORCO/VICODIN) 5-325 MG tablet Take 1 tablet by mouth every 6 (six) hours as needed for moderate pain. 10/31/21  Yes Carole Civil, MD  ?Multiple Vitamin (MULITIVITAMIN WITH MINERALS) TABS Take 1 tablet by mouth daily.   Yes [provider]  ?pregabalin (LYRICA) 50 MG capsule Take 50 mg by mouth 3 (three) times daily. 07/22/21  Yes [provider]  ?polyethylene glycol-electrolytes (TRILYTE) 420 g solution Take 4,000 mLs by mouth as directed. 10/12/21   Brilyn Tuller, [name], MD  ? ? ?Allergies as of 10/12/2021  ? (No Known Allergies)  ? ? ?Family History  ?Adopted: Yes  ?Problem Relation Age of Onset  ? Colon cancer Neg Hx   ? Liver disease Neg Hx   ? Anesthesia problems Neg Hx   ? Hypotension Neg Hx   ? Malignant hyperthermia Neg Hx   ? Pseudochol deficiency Neg Hx   ? ? ?Social History  ? ?Socioeconomic History  ? Marital status: Divorced  ?  Spouse name: Not on file  ? Number of children: 2  ? Years of education: Not on file  ? Highest education level: Bachelor's degree (e.g., BA, AB, BS)  ?Occupational History  ?  Employer: [id number]  ?Tobacco Use  ? Smoking status: Never  ? Smokeless tobacco: Never  ?Vaping Use  ? Vaping Use: Never used  ?Substance and Sexual Activity  ? Alcohol  use: No  ? Drug use: No  ? Sexual activity: Yes  ?  Birth control/protection: Surgical  ?Other Topics Concern  ? Not on file  ?Social History Narrative  ? Lives alone  ? ?Social Determinants of Health  ? ?Financial Resource Strain: Not on file  ?Food Insecurity: Not on file  ?Transportation Needs: Not on file  ?Physical Activity: Not on file  ?Stress: Not on file  ?Social Connections: Not on file  ?Intimate Partner Violence: Not on file  ? ? ?Review of Systems: ?See HPI, otherwise negative ROS ? ?Physical Exam: ?BP 127/76   Pulse 75   Temp 97.8 ?F (36.6 ?C) (Oral)    Resp 15   LMP 10/27/2013   SpO2 96%  ?General:   Alert,  Well-developed, well-nourished, pleasant and cooperative in NAD ?Neck:  Supple; no masses or thyromegaly. No significant cervical adenopathy. ?Lungs:  Clear throughout to auscultation.   No wheezes, crackles, or rhonchi. No acute distress. ?Heart:  Regular rate and rhythm; no murmurs, clicks, rubs,  or gallops. ?Abdomen: Non-distended, normal bowel sounds.  Soft and nontender without appreciable mass or hepatosplenomegaly.  ?Pulses:  Normal pulses noted. ?Extremities:  Without clubbing or edema. ? ?Impression/Plan: 61 year old lady here for diagnostic colonoscopy.  PET scan increased activity near the hepatic flexure history of colonic adenomas; overdue for colonoscopy.  Diagnostic colonoscopy today we will go right side down due to patient's recent left shoulder fracture per her request. ? ?The risks, benefits, limitations, alternatives and imponderables have been reviewed with the patient. Questions have been answered. All parties are agreeable.   ? ? ? ? ?Notice: This dictation was prepared with Dragon dictation along with smaller phrase technology. Any transcriptional errors that result from this process are unintentional and may not be corrected upon review.  ? ?

## 2021-11-30 NOTE — Discharge Instructions (Signed)
?  Colonoscopy ?Discharge Instructions ? ?Read the instructions outlined below and refer to this sheet in the next few weeks. These discharge instructions provide you with general information on caring for yourself after you leave the hospital. Your doctor may also give you specific instructions. While your treatment has been planned according to the most current medical practices available, unavoidable complications occasionally occur. If you have any problems or questions after discharge, call Dr. Gala Romney at [phone]. ?ACTIVITY ?You may resume your regular activity, but move at a slower pace for the next 24 hours.  ?Take frequent rest periods for the next 24 hours.  ?Walking will help get rid of the air and reduce the bloated feeling in your belly (abdomen).  ?No driving for 24 hours (because of the medicine (anesthesia) used during the test).   ?Do not sign any important legal documents or operate any machinery for 24 hours (because of the anesthesia used during the test).  ?NUTRITION ?Drink plenty of fluids.  ?You may resume your normal diet as instructed by your doctor.  ?Begin with a light meal and progress to your normal diet. Heavy or fried foods are harder to digest and may make you feel sick to your stomach (nauseated).  ?Avoid alcoholic beverages for 24 hours or as instructed.  ?MEDICATIONS ?You may resume your normal medications unless your doctor tells you otherwise.  ?WHAT YOU CAN EXPECT TODAY ?Some feelings of bloating in the abdomen.  ?Passage of more gas than usual.  ?Spotting of blood in your stool or on the toilet paper.  ?IF YOU HAD POLYPS REMOVED DURING THE COLONOSCOPY: ?No aspirin products for 7 days or as instructed.  ?No alcohol for 7 days or as instructed.  ?Eat a soft diet for the next 24 hours.  ?FINDING OUT THE RESULTS OF YOUR TEST ?Not all test results are available during your visit. If your test results are not back during the visit, make an appointment with your caregiver to find out the  results. Do not assume everything is normal if you have not heard from your caregiver or the medical facility. It is important for you to follow up on all of your test results.  ?SEEK IMMEDIATE MEDICAL ATTENTION IF: ?You have more than a spotting of blood in your stool.  ?Your belly is swollen (abdominal distention).  ?You are nauseated or vomiting.  ?You have a temperature over 101.  ?You have abdominal pain or discomfort that is severe or gets worse throughout the day.   ? ?2 polyps removed from your colon today.  No tumor found ? ?Polyp, diverticulosis and hemorrhoid information provided ? ?Further recommendations to follow pending review of pathology report ? ?At patient request, I called Philis Fendt at [phone] -reviewed findings and recommendations ? ? ?

## 2021-12-01 ENCOUNTER — Encounter: Payer: Self-pay | Admitting: Internal Medicine

## 2021-12-01 LAB — SURGICAL PATHOLOGY

## 2021-12-06 ENCOUNTER — Encounter (HOSPITAL_COMMUNITY): Payer: Self-pay | Admitting: Internal Medicine

## 2021-12-08 ENCOUNTER — Inpatient Hospital Stay: Payer: 59

## 2021-12-08 ENCOUNTER — Inpatient Hospital Stay: Payer: 59 | Admitting: Oncology

## 2021-12-08 ENCOUNTER — Ambulatory Visit (HOSPITAL_COMMUNITY): Payer: 59

## 2021-12-08 ENCOUNTER — Encounter (HOSPITAL_COMMUNITY): Payer: Self-pay

## 2021-12-08 DIAGNOSIS — R29898 Other symptoms and signs involving the musculoskeletal system: Secondary | ICD-10-CM | POA: Diagnosis not present

## 2021-12-08 DIAGNOSIS — M25612 Stiffness of left shoulder, not elsewhere classified: Secondary | ICD-10-CM

## 2021-12-08 DIAGNOSIS — M25512 Pain in left shoulder: Secondary | ICD-10-CM

## 2021-12-08 NOTE — Therapy (Signed)
OUTPATIENT OCCUPATIONAL THERAPY ORTHO TREATMENT  Patient Name: Dana Hanson MRN: [id number] DOB:09-25-1960, 61 y.o., female Today's Date: 12/08/2021  PCP: [name], MD REFERRING PROVIDER: Arther Abbott, MD   Rationale for Evaluation and Treatment Rehabilitation   OT End of Session - 12/08/21 1152     Visit Number 2    Number of Visits 12    Date for OT Re-Evaluation 01/10/22    Authorization Type Generic [employer]    Authorization Time Period $40 copay,no visit limit    OT Start Time 1124   pt checked in late   OT Stop Time 1155    OT Time Calculation (min) 31 min    Activity Tolerance Patient tolerated treatment well    Behavior During Therapy WFL for tasks assessed/performed              Past Medical History:  Diagnosis Date   Cancer ([place])    kidney   Gallstone pancreatitis    Hyperlipidemia    IBS (irritable bowel syndrome)    patient denies   Right renal mass    Past Surgical History:  Procedure Laterality Date   CESAREAN SECTION     x 2-tubal with last c-section   CHOLECYSTECTOMY N/A 09/03/2015   Procedure: LAPAROSCOPIC CHOLECYSTECTOMY WITH INTRAOPERATIVE CHOLANGIOGRAM;  Surgeon: Mickeal Skinner, MD;  Location: [facility];  Service: General;  Laterality: N/A;   COLONOSCOPY  08/14/2011   Surgeon: Daneil Dolin, MD; external hemorrhoid tags, likely source of hematochezia, normal rectum, multiple colonic polyps at the base of the cecum, all less than 5 mm, single 4 mm sigmoid polyp, scattered diverticula.  Pathology revealed tubular adenoma in the cecum, hyperplastic polyp in the sigmoid colon.  Recommended repeat colonoscopy in 3 years.   COLONOSCOPY WITH PROPOFOL N/A 11/30/2021   Procedure: COLONOSCOPY WITH PROPOFOL;  Surgeon: Daneil Dolin, MD;  Location: [facility];  Service: Endoscopy;  Laterality: N/A;  1:45pm   HEMORRHOID SURGERY  11/10/2011   Procedure: HEMORRHOIDECTOMY;  Surgeon: Donato Heinz, MD;  Location: [facility];  Service: General;   Laterality: N/A;   [place] RESECTION  01/15/2015   LAPAROSCOPIC NEPHRECTOMY N/A 09/03/2015   Procedure: LAPAROSCOPIC RADICAL NEPHRECTOMY;  Surgeon: Ardis Hughs, MD;  Location: [facility];  Service: Urology;  Laterality: N/A;   POLYPECTOMY  11/30/2021   Procedure: POLYPECTOMY;  Surgeon: Daneil Dolin, MD;  Location: [facility];  Service: Endoscopy;;   TUBAL LIGATION     with last c-section   VERTEBROPLASTY N/A 09/09/2021   Procedure: Thoracic vertebral biopsy;  Surgeon: Ashok Pall, MD;  Location: [facility];  Service: Neurosurgery;  Laterality: N/A;  RM 21   Patient Active Problem List   Diagnosis Date Noted   History of colonic polyps 10/12/2021   Abnormal PET scan of colon 10/12/2021   Metastatic renal cell carcinoma to bone ([place]) 09/27/2021   Cancer of right kidney ([place]) 09/03/2015   Renal mass 09/03/2015   Pancreatitis 08/21/2015   Biliary obstruction    Primary osteoarthritis of right knee 03/17/2014   Hemorrhoids 08/01/2011   Heme positive stool 08/01/2011   Rectal bleed 08/01/2011    ONSET DATE: 10/30/21  REFERRING DIAG: left shoulder proximal humerus fracture  THERAPY DIAG:  Other symptoms and signs involving the musculoskeletal system  Stiffness of left shoulder joint  Pain in joint of left shoulder  SUBJECTIVE:   SUBJECTIVE STATEMENT: S: I have had a rough time since I saw you and haven't been feeling well  from the injection I get every 3 weeks. I wasn't able to do my exercises as much as I wanted to.  PERTINENT HISTORY: Patient presenting with a left closed nondisplaced proximal humerus fracture sustained from a mechanical fall on 10/30/21. She was placed in a sling and reports that she has weaned herself from it. Today she is 4 weeks post injury. Next follow up appointment with Dr. Aline Brochure: 12/19/21.  PRECAUTIONS: Shoulder Standard protocol: Week 3-5 (5/7-5/21) AA/ROM (when pain is diminished and pt is less apprehensive). Week 6-8  (5/28-6/11) A/ROM, isometrics, Week 8-10 early strengthening  WEIGHT BEARING RESTRICTIONS Yes NWB LUE  PAIN:  Are you having pain? Yes: NPRS scale: 3/10 Pain location: left shoulder Pain description: constant, sore Aggravating factors: Was laying on her left side for colonoscopy Relieving factors: Prescription pain medication, Tylenol, gel ice pack, sling PRN     OBJECTIVE:    FUNCTIONAL OUTCOME MEASURES: FOTO: 50/100  UE ROM     Active ROM - seated. IR/er adducted Left 11/29/2021  Shoulder flexion 102  Shoulder abduction 85  Shoulder internal rotation 85  Shoulder external rotation 55  (Blank rows = not tested)  Passive ROM - supine. IR/er adducted Left 11/29/2021  Shoulder flexion 125  Shoulder abduction 91  Shoulder internal rotation 90  Shoulder external rotation 62  (Blank rows = not tested)   UE MMT:     MMT - seated. IR/er adducted Left 11/29/2021  Shoulder flexion 3-/5  Shoulder abduction 3-/5  Shoulder internal rotation 3/5  Shoulder external rotation 3-/5  (Blank rows = not tested)         PATIENT EDUCATION: Education details:  Person educated: Education method:  Education comprehension:   HOME EXERCISE PROGRAM: Eval: table slides  TREATMENT:   12/08/21 [name] Therapy: Completed prior to exercises. Completed myofascial release to left upper arm, upper trapezius and scapularis region.  - P/ROM: shoulder, supine, all ranges, 10X - A/ROM, seated, scapular row, 10X - Therapy ball stretches; flexion, abduction, 10X with 2" hold at end of stretch.  - Pro/elev/dep/ret 1' low level - Isometrics: shoulder, supine, all ranges, 3x5"     GOALS:   SHORT TERM GOALS: Target date: 12/20/2021   Patient will be educated and independent with HEP in order to facilitate her progress in therapy and allow her to return to using her left arm as her dominant extremity for all daily tasks.  Baseline: Goal status: On-Going  2.  Patient will increase  her LUE P/ROM to [facility] in order to increase the ability to complete dressing tasks with less difficulty.  Baseline:  Goal status: On-Going  3.  Patient will increase her LUE strength to 3+/5 in order to increase her ability to complete reaching tasks at or above shoulder level with less difficulty.  Baseline:  Goal status: On-Going  4.  Patient will decrease her LUE fascial restrictions to min amount or less in order to increase the functional mobility needed to complete reaching tasks.  Baseline:  Goal status: On-Going    LONG TERM GOALS: Target date: 01/10/2022    Patient will increase her LUE A/ROM to [facility] in order to complete high level reaching tasks with less difficulty. Baseline:  Goal status: On-Going  2.  Patient will increase her LUE strength to 4+/5 or better in order to return to using her LUE as her dominant extremity while managing moderate weighted items. Baseline:  Goal status: On-Going  3.  Patient will report a pain level of 2/10 or less  in her LUE while utilizing it to complete basic ADL tasks.  Baseline:  Goal status: On-Going    ASSESSMENT:  CLINICAL IMPRESSION: A: Initiated myofascial release to the left arm to address moderate fascial restrictions. Patient limited to approximately 90 degrees of passive flexion and abduction due to pain. VC for form and technique were provided. Pain level monitored and patient was provided modifications and adjustments to accommodate.       PLAN: OT FREQUENCY: 2x/week  OT DURATION: 6 weeks  PLANNED INTERVENTIONS: self care/ADL training, therapeutic exercise, therapeutic activity, neuromuscular re-education, passive range of motion, electrical stimulation, ultrasound, moist heat, cryotherapy, patient/family education, and DME and/or AE instructions    CONSULTED AND AGREED WITH PLAN OF CARE: Patient  PLAN FOR NEXT SESSION: P: Continue with manual techniques, passive stretching. Take updated measurements for Dr.  Ruthe Mannan appointment. Progress to AA/ROM when pain level allows.    Ailene Ravel, OTR/L,CBIS  [phone]  12/08/2021, 12:02 PM

## 2021-12-09 ENCOUNTER — Inpatient Hospital Stay (HOSPITAL_BASED_OUTPATIENT_CLINIC_OR_DEPARTMENT_OTHER): Payer: 59 | Admitting: Oncology

## 2021-12-09 ENCOUNTER — Other Ambulatory Visit: Payer: Self-pay

## 2021-12-09 ENCOUNTER — Inpatient Hospital Stay: Payer: 59

## 2021-12-09 VITALS — BP 118/65 | HR 90 | Temp 97.8°F | Resp 16 | Wt 167.0 lb

## 2021-12-09 DIAGNOSIS — C641 Malignant neoplasm of right kidney, except renal pelvis: Secondary | ICD-10-CM | POA: Diagnosis not present

## 2021-12-09 DIAGNOSIS — Z5112 Encounter for antineoplastic immunotherapy: Secondary | ICD-10-CM | POA: Diagnosis not present

## 2021-12-09 LAB — CBC WITH DIFFERENTIAL (CANCER CENTER ONLY)
Abs Immature Granulocytes: 0 10*3/uL (ref 0.00–0.07)
Basophils Absolute: 0.1 10*3/uL (ref 0.0–0.1)
Basophils Relative: 1 %
Eosinophils Absolute: 0.1 10*3/uL (ref 0.0–0.5)
Eosinophils Relative: 3 %
HCT: 34.7 % — ABNORMAL LOW (ref 36.0–46.0)
Hemoglobin: 11.3 g/dL — ABNORMAL LOW (ref 12.0–15.0)
Immature Granulocytes: 0 %
Lymphocytes Relative: 32 %
Lymphs Abs: 1.2 10*3/uL (ref 0.7–4.0)
MCH: 29 pg (ref 26.0–34.0)
MCHC: 32.6 g/dL (ref 30.0–36.0)
MCV: 89 fL (ref 80.0–100.0)
Monocytes Absolute: 0.5 10*3/uL (ref 0.1–1.0)
Monocytes Relative: 13 %
Neutro Abs: 1.9 10*3/uL (ref 1.7–7.7)
Neutrophils Relative %: 51 %
Platelet Count: 220 10*3/uL (ref 150–400)
RBC: 3.9 MIL/uL (ref 3.87–5.11)
RDW: 13.3 % (ref 11.5–15.5)
WBC Count: 3.6 10*3/uL — ABNORMAL LOW (ref 4.0–10.5)
nRBC: 0 % (ref 0.0–0.2)

## 2021-12-09 LAB — CMP (CANCER CENTER ONLY)
ALT: 28 U/L (ref 0–44)
AST: 38 U/L (ref 15–41)
Albumin: 4 g/dL (ref 3.5–5.0)
Alkaline Phosphatase: 74 U/L (ref 38–126)
Anion gap: 7 (ref 5–15)
BUN: 23 mg/dL — ABNORMAL HIGH (ref 6–20)
CO2: 26 mmol/L (ref 22–32)
Calcium: 10 mg/dL (ref 8.9–10.3)
Chloride: 108 mmol/L (ref 98–111)
Creatinine: 0.89 mg/dL (ref 0.44–1.00)
GFR, Estimated: >60 mL/min (ref >60)
Glucose, Bld: 139 mg/dL — ABNORMAL HIGH (ref 70–99)
Potassium: 3.7 mmol/L (ref 3.5–5.1)
Sodium: 141 mmol/L (ref 135–145)
Total Bilirubin: 0.7 mg/dL (ref 0.3–1.2)
Total Protein: 6.8 g/dL (ref 6.5–8.1)

## 2021-12-09 LAB — TSH: TSH: 0.013 u[IU]/mL — ABNORMAL LOW (ref 0.350–4.500)

## 2021-12-09 MED ORDER — SODIUM CHLORIDE 0.9 % IV SOLN: Freq: Once | INTRAVENOUS | Status: AC

## 2021-12-09 MED ORDER — SODIUM CHLORIDE 0.9 % IV SOLN
200.0000 mg | Freq: Once | INTRAVENOUS | Status: AC
  Administered 2021-12-09: 200 mg via INTRAVENOUS
  Filled 2021-12-09: qty 8

## 2021-12-09 NOTE — Patient Instructions (Signed)
[facility]   Discharge Instructions: Thank you for choosing [facility] to provide your oncology and hematology care.   If you have a lab appointment with the [facility], please go directly to the [facility] and check in at the registration area.   Wear comfortable clothing and clothing appropriate for easy access to any Portacath or PICC line.   We strive to give you quality time with your provider. You may need to reschedule your appointment if you arrive late (15 or more minutes).  Arriving late affects you and other patients whose appointments are after yours.  Also, if you miss three or more appointments without notifying the office, you may be dismissed from the clinic at the provider's discretion.      For prescription refill requests, have your pharmacy contact our office and allow 72 hours for refills to be completed.    Today you received the following chemotherapy and/or immunotherapy agents: pembrolizumab      To help prevent nausea and vomiting after your treatment, we encourage you to take your nausea medication as directed.  BELOW ARE SYMPTOMS THAT SHOULD BE REPORTED IMMEDIATELY: *FEVER GREATER THAN 100.4 F (38 C) OR HIGHER *CHILLS OR SWEATING *NAUSEA AND VOMITING THAT IS NOT CONTROLLED WITH YOUR NAUSEA MEDICATION *UNUSUAL SHORTNESS OF BREATH *UNUSUAL BRUISING OR BLEEDING *URINARY PROBLEMS (pain or burning when urinating, or frequent urination) *BOWEL PROBLEMS (unusual diarrhea, constipation, pain near the anus) TENDERNESS IN MOUTH AND THROAT WITH OR WITHOUT PRESENCE OF ULCERS (sore throat, sores in mouth, or a toothache) UNUSUAL RASH, SWELLING OR PAIN  UNUSUAL VAGINAL DISCHARGE OR ITCHING   Items with * indicate a potential emergency and should be followed up as soon as possible or go to the Emergency Department if any problems should occur.  Please show the CHEMOTHERAPY ALERT CARD or IMMUNOTHERAPY ALERT CARD at  check-in to the Emergency Department and triage nurse.  Should you have questions after your visit or need to cancel or reschedule your appointment, please contact [facility]  Dept: [phone]  and follow the prompts.  Office hours are 8:00 a.m. to 4:30 p.m. Monday - Friday. Please note that voicemails left after 4:00 p.m. may not be returned until the following business day.  We are closed weekends and major holidays. You have access to a nurse at all times for urgent questions. Please call the main number to the clinic Dept: [phone] and follow the prompts.   For any non-urgent questions, you may also contact your provider using MyChart. We now offer e-Visits for anyone 60 and older to request care online for non-urgent symptoms. For details visit [email or web address].   Also download the MyChart app! Go to the app store, search "MyChart", open the app, select [facility], and log in with your MyChart username and password.  Due to Covid, a mask is required upon entering the hospital/clinic. If you do not have a mask, one will be given to you upon arrival. For doctor visits, patients may have 1 support person aged 60 or older with them. For treatment visits, patients cannot have anyone with them due to current Covid guidelines and our immunocompromised population.    Pembrolizumab injection What is this medication? PEMBROLIZUMAB (pem broe liz ue mab) is a monoclonal antibody. It is used to treat certain types of cancer. This medicine may be used for other purposes; ask your health care provider or pharmacist if you have questions. COMMON BRAND NAME(S):  Keytruda What should I tell my care team before I take this medication? They need to know if you have any of these conditions: autoimmune diseases like Crohn's disease, ulcerative colitis, or lupus have had or planning to have an allogeneic stem cell transplant (uses someone else's stem cells) history of  organ transplant history of chest radiation nervous system problems like myasthenia gravis or Guillain-Barre syndrome an unusual or allergic reaction to pembrolizumab, other medicines, foods, dyes, or preservatives pregnant or trying to get pregnant breast-feeding How should I use this medication? This medicine is for infusion into a vein. It is given by a health care professional in a hospital or clinic setting. A special MedGuide will be given to you before each treatment. Be sure to read this information carefully each time. Talk to your pediatrician regarding the use of this medicine in children. While this drug may be prescribed for children as young as 6 months for selected conditions, precautions do apply. Overdosage: If you think you have taken too much of this medicine contact a poison control center or emergency room at once. NOTE: This medicine is only for you. Do not share this medicine with others. What if I miss a dose? It is important not to miss your dose. Call your doctor or health care professional if you are unable to keep an appointment. What may interact with this medication? Interactions have not been studied. This list may not describe all possible interactions. Give your health care provider a list of all the medicines, herbs, non-prescription drugs, or dietary supplements you use. Also tell them if you smoke, drink alcohol, or use illegal drugs. Some items may interact with your medicine. What should I watch for while using this medication? Your condition will be monitored carefully while you are receiving this medicine. You may need blood work done while you are taking this medicine. Do not become pregnant while taking this medicine or for 4 months after stopping it. Women should inform their doctor if they wish to become pregnant or think they might be pregnant. There is a potential for serious side effects to an unborn child. Talk to your health care professional or  pharmacist for more information. Do not breast-feed an infant while taking this medicine or for 4 months after the last dose. What side effects may I notice from receiving this medication? Side effects that you should report to your doctor or health care professional as soon as possible: allergic reactions like skin rash, itching or hives, swelling of the face, lips, or tongue bloody or black, tarry breathing problems changes in vision chest pain chills confusion constipation cough diarrhea dizziness or feeling faint or lightheaded fast or irregular heartbeat fever flushing joint pain low blood counts - this medicine may decrease the number of white blood cells, red blood cells and platelets. You may be at increased risk for infections and bleeding. muscle pain muscle weakness pain, tingling, numbness in the hands or feet persistent headache redness, blistering, peeling or loosening of the skin, including inside the mouth signs and symptoms of high blood sugar such as dizziness; dry mouth; dry skin; fruity breath; nausea; stomach pain; increased hunger or thirst; increased urination signs and symptoms of kidney injury like trouble passing urine or change in the amount of urine signs and symptoms of liver injury like dark urine, light-colored stools, loss of appetite, nausea, right upper belly pain, yellowing of the eyes or skin sweating swollen lymph nodes weight loss Side effects that usually do not  require medical attention (report to your doctor or health care professional if they continue or are bothersome): decreased appetite hair loss tiredness This list may not describe all possible side effects. Call your doctor for medical advice about side effects. You may report side effects to FDA at 1-800-FDA-1088. Where should I keep my medication? This drug is given in a hospital or clinic and will not be stored at home. NOTE: This sheet is a summary. It may not cover all possible  information. If you have questions about this medicine, talk to your doctor, pharmacist, or health care provider.  2023 Elsevier/Gold Standard (2021-06-03 00:00:00)  

## 2021-12-09 NOTE — Progress Notes (Signed)
Hematology and Oncology Follow Up Visit  Dana Hanson [id number] [birth year] 61 y.o. 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  She initially presented with stage Ib disease in 2017.   Prior Therapy:  She is status post laparoscopic right radical nephrectomy performed in February 2017.  She was found to have 6.5 cm clear-cell renal cell carcinoma.  She is status post surgical biopsy completed by Dr. Christella Noa on September 09, 2021 of the T6 spinal lesion which confirmed the presence of metastatic renal cell carcinoma.  She is status post spinal stereotactic radiosurgery completed on September 27, 2021 for isolated metastatic lesion of the T6 spine.   Current therapy: Pembrolizumab 200 mg every 3 weeks cycle 1 started on Nov 18, 2021.  She is here for cycle 2 of therapy.  Interim History: Dana Hanson returns today for repeat evaluation.  Since last visit, she received the first cycle of pembrolizumab without any major complications.  Last few days she has reported some arthralgias although it is unclear if it is related to the treatment.  She denies any nausea, vomiting.  She denies any fevers or chills or sweats.  Performance status quality of life remain excellent.  She continues to participate in physical therapy for her shoulder surgery.  She denies any diarrhea or respiratory complaints.     Medications: Updated on review. Current Outpatient Medications  Medication Sig Dispense Refill   acetaminophen (TYLENOL) 325 MG tablet Take 2 tablets (650 mg total) by mouth every 6 (six) hours as needed for moderate pain. 30 tablet 0   HYDROcodone-acetaminophen (NORCO/VICODIN) 5-325 MG tablet Take 1 tablet by mouth every 6 (six) hours as needed for moderate pain. 30 tablet 0   Multiple Vitamin (MULITIVITAMIN WITH MINERALS) TABS Take 1  tablet by mouth daily.     polyethylene glycol-electrolytes (TRILYTE) 420 g solution Take 4,000 mLs by mouth as directed. 4000 mL 0   pregabalin (LYRICA) 50 MG capsule Take 50 mg by mouth 3 (three) times daily.     No current facility-administered medications for this visit.     Allergies: No Known Allergies    Physical Exam:    ECOG: 1    General appearance: Comfortable appearing without any discomfort Head: Normocephalic without any trauma Oropharynx: Mucous membranes are moist and pink without any thrush or ulcers. Eyes: Pupils are equal and round reactive to light. Lymph nodes: No cervical, supraclavicular, inguinal or axillary lymphadenopathy.   Heart:regular rate and rhythm.  S1 and S2 without leg edema. Lung: Clear without any rhonchi or wheezes.  No dullness to percussion. Abdomin: Soft, nontender, nondistended with good bowel sounds.  No hepatosplenomegaly. Musculoskeletal: No joint deformity or effusion.  Full range of motion noted. Neurological: No deficits noted on motor, sensory and deep tendon reflex exam. Skin: No petechial rash or dryness.  Appeared moist.         Lab Results: Lab Results  Component Value Date   WBC 4.1 11/18/2021   HGB 12.4 11/18/2021   HCT 39.0 11/18/2021   MCV 89.0 11/18/2021   PLT 216 11/18/2021     Chemistry      Component Value Date/Time   NA 142 11/18/2021 0950   K 3.9 11/18/2021 0950   CL 108 11/18/2021 0950   CO2 26 11/18/2021 0950   BUN 23 (H) 11/18/2021 0950   CREATININE 1.01 (H)  11/18/2021 0950      Component Value Date/Time   CALCIUM 9.7 11/18/2021 0950   ALKPHOS 73 11/18/2021 0950   AST 19 11/18/2021 0950   ALT 12 11/18/2021 0950   BILITOT 0.7 11/18/2021 0950          Impression and Plan:  61 year old with:  1.  Stage IV clear-cell renal cell carcinoma with isolated metastatic lesion to the thoracic spine January 2023.  Risks and benefits of continuing adjuvant pembrolizumab were discussed.   Complications that include nausea, fatigue, autoimmune considerations.  Switching to an every 6-week schedule he was also discussed at this time.  Repeat imaging studies for staging purposes will be repeated in August 2023.  She is agreeable to proceed and we will keep her every 3-week schedule and monitor her symptoms.  He did report some arthralgias that are unlikely related to Pembrolizumab.    2.  Thoracic spine metastasis: She will have repeat MRI in June 2023.  She is status post radiation therapy.  3.  Autoimmune complications: I continue to educate her about these complications including pneumonitis, colitis and hypophysitis.   4.  Follow-up: She will return in 3 weeks for the next cycle of therapy.  30  minutes were dedicated to this visit.  The time spent on reviewing laboratory data, disease status update, addressing complication related to cancer and cancer therapy.    Dana Button, MD 5/26/20238:35 AM

## 2021-12-15 ENCOUNTER — Ambulatory Visit (INDEPENDENT_AMBULATORY_CARE_PROVIDER_SITE_OTHER): Payer: 59 | Admitting: Orthopedic Surgery

## 2021-12-15 ENCOUNTER — Ambulatory Visit (HOSPITAL_COMMUNITY): Payer: 59 | Attending: Orthopedic Surgery | Admitting: Occupational Therapy

## 2021-12-15 ENCOUNTER — Encounter (HOSPITAL_COMMUNITY): Payer: Self-pay | Admitting: Occupational Therapy

## 2021-12-15 DIAGNOSIS — M25612 Stiffness of left shoulder, not elsewhere classified: Secondary | ICD-10-CM | POA: Diagnosis present

## 2021-12-15 DIAGNOSIS — S42295D Other nondisplaced fracture of upper end of left humerus, subsequent encounter for fracture with routine healing: Secondary | ICD-10-CM

## 2021-12-15 DIAGNOSIS — M25512 Pain in left shoulder: Secondary | ICD-10-CM | POA: Diagnosis present

## 2021-12-15 DIAGNOSIS — R29898 Other symptoms and signs involving the musculoskeletal system: Secondary | ICD-10-CM | POA: Diagnosis present

## 2021-12-15 NOTE — Progress Notes (Signed)
Chief Complaint  Patient presents with   Shoulder Injury    Other closed nondisplaced fracture of proximal end of LT humerus DOI 10/30/21 6 wk follow up/just coming from PT   Encounter Diagnosis  Name Primary?   Other closed nondisplaced fracture of proximal end of left humerus with routine healing, subsequent encounter Yes   6 weeks post injury   PT NUMBERS LOOK GOOD SEE REPORT   CONTINUE PT   FU 6 WEEKS

## 2021-12-15 NOTE — Patient Instructions (Signed)

## 2021-12-15 NOTE — Therapy (Signed)
OUTPATIENT OCCUPATIONAL THERAPY ORTHO TREATMENT  Patient Name: Dana Hanson MRN: [id number] DOB:11-21-1960, 61 y.o., female Today's Date: 12/15/2021  PCP: [name], MD REFERRING PROVIDER: Arther Abbott, MD  UE ROM     Active ROM - seated. IR/er adducted Left 11/29/2021 Left 12/15/2021  Shoulder flexion 102 113  Shoulder abduction 85 104  Shoulder internal rotation 85 85  Shoulder external rotation 55 42  (Blank rows = not tested)  Passive ROM - supine. IR/er adducted Left 11/29/2021 Left 12/15/2021  Shoulder flexion 125 136  Shoulder abduction 91 84  Shoulder internal rotation 90 90  Shoulder external rotation 62 42  (Blank rows = not tested)   UE MMT:     MMT - seated. IR/er adducted Left 11/29/2021 Left 12/15/2021  Shoulder flexion 3-/5 3/5  Shoulder abduction 3-/5 3-/5  Shoulder internal rotation 3/5 3/5  Shoulder external rotation 3-/5 3/5  (Blank rows = not tested)    Rationale for Evaluation and Treatment Rehabilitation    OT End of Session - 12/15/21 1415     Visit Number 3    Number of Visits 12    Date for OT Re-Evaluation 01/10/22    Authorization Type Generic [employer]    Authorization Time Period $40 copay,no visit limit    OT Start Time 1346    OT Stop Time 1413   pt left early to get to MD appt   OT Time Calculation (min) 27 min    Activity Tolerance Patient tolerated treatment well    Behavior During Therapy WFL for tasks assessed/performed               Past Medical History:  Diagnosis Date   Cancer ([place])    kidney   Gallstone pancreatitis    Hyperlipidemia    IBS (irritable bowel syndrome)    patient denies   Right renal mass    Past Surgical History:  Procedure Laterality Date   CESAREAN SECTION     x 2-tubal with last c-section   CHOLECYSTECTOMY N/A 09/03/2015   Procedure: LAPAROSCOPIC CHOLECYSTECTOMY WITH INTRAOPERATIVE CHOLANGIOGRAM;  Surgeon: Arta Bruce Kinsinger, MD;  Location: [facility];  Service: General;  Laterality:  N/A;   COLONOSCOPY  08/14/2011   Surgeon: Daneil Dolin, MD; external hemorrhoid tags, likely source of hematochezia, normal rectum, multiple colonic polyps at the base of the cecum, all less than 5 mm, single 4 mm sigmoid polyp, scattered diverticula.  Pathology revealed tubular adenoma in the cecum, hyperplastic polyp in the sigmoid colon.  Recommended repeat colonoscopy in 3 years.   COLONOSCOPY WITH PROPOFOL N/A 11/30/2021   Procedure: COLONOSCOPY WITH PROPOFOL;  Surgeon: Daneil Dolin, MD;  [facility];  Service: Endoscopy;  Laterality: N/A;  1:45pm   HEMORRHOID SURGERY  11/10/2011   Procedure: HEMORRHOIDECTOMY;  Surgeon: Donato Heinz, MD;  Location: [facility];  Service: General;  Laterality: N/A;   [place] RESECTION  01/15/2015   LAPAROSCOPIC NEPHRECTOMY N/A 09/03/2015   Procedure: LAPAROSCOPIC RADICAL NEPHRECTOMY;  Surgeon: Ardis Hughs, MD;  Location: [facility];  Service: Urology;  Laterality: N/A;   POLYPECTOMY  11/30/2021   Procedure: POLYPECTOMY;  Surgeon: Daneil Dolin, MD;  [facility];  Service: Endoscopy;;   TUBAL LIGATION     with last c-section   VERTEBROPLASTY N/A 09/09/2021   Procedure: Thoracic vertebral biopsy;  Surgeon: Ashok Pall, MD;  Location: [facility];  Service: Neurosurgery;  Laterality: N/A;  RM 21   Patient Active Problem List  Diagnosis Date Noted   History of colonic polyps 10/12/2021   Abnormal PET scan of colon 10/12/2021   Metastatic renal cell carcinoma to bone ([place]) 09/27/2021   Cancer of right kidney ([place]) 09/03/2015   Renal mass 09/03/2015   Pancreatitis 08/21/2015   Biliary obstruction    Primary osteoarthritis of right knee 03/17/2014   Hemorrhoids 08/01/2011   Heme positive stool 08/01/2011   Rectal bleed 08/01/2011    ONSET DATE: 10/30/21  REFERRING DIAG: left shoulder proximal humerus fracture  THERAPY DIAG:  Other symptoms and signs involving the musculoskeletal system  Stiffness of  left shoulder joint  Pain in joint of left shoulder  SUBJECTIVE:   SUBJECTIVE STATEMENT: S: I'm now able to sleep most of the night.   PERTINENT HISTORY: Patient presenting with a left closed nondisplaced proximal humerus fracture sustained from a mechanical fall on 10/30/21. She was placed in a sling and reports that she has weaned herself from it. Today she is 4 weeks post injury. Next follow up appointment with Dr. Aline Brochure: 12/15/21.  PRECAUTIONS: Shoulder Standard protocol: Week 3-5 (5/7-5/21) AA/ROM (when pain is diminished and pt is less apprehensive). Week 6-8 (5/28-6/11) A/ROM, isometrics, Week 8-10 early strengthening  WEIGHT BEARING RESTRICTIONS Yes NWB LUE  PAIN:  Are you having pain? Yes: NPRS scale: 6/10 Pain location: left shoulder Pain description: constant, sore Aggravating factors: Was laying on her left side for colonoscopy Relieving factors: Prescription pain medication, Tylenol, gel ice pack, sling PRN     OBJECTIVE:    FUNCTIONAL OUTCOME MEASURES: FOTO: 50/100  UE ROM     Active ROM - seated. IR/er adducted Left 11/29/2021 Left 12/15/2021  Shoulder flexion 102 113  Shoulder abduction 85 104  Shoulder internal rotation 85 85  Shoulder external rotation 55 42  (Blank rows = not tested)  Passive ROM - supine. IR/er adducted Left 11/29/2021 Left 12/15/2021  Shoulder flexion 125 136  Shoulder abduction 91 84  Shoulder internal rotation 90 90  Shoulder external rotation 62 42  (Blank rows = not tested)   UE MMT:     MMT - seated. IR/er adducted Left 11/29/2021 Left 12/15/2021  Shoulder flexion 3-/5 3/5  Shoulder abduction 3-/5 3-/5  Shoulder internal rotation 3/5 3/5  Shoulder external rotation 3-/5 3/5  (Blank rows = not tested)         PATIENT EDUCATION: Education details: AA/ROM Person educated: patient Education method: explanation, demonstration Education comprehension: verbalized understanding, returned demonstration   HOME  EXERCISE PROGRAM: Eval: table slides  TREATMENT:  12/15/21 [name] Therapy: Completed prior to exercises. Completed myofascial release to left upper arm, upper trapezius and scapularis region.  - P/ROM: shoulder, supine, all ranges, 10X -AA/ROM: seated, shoulder protraction, flexion, er/IR, abduction, horizontal abduction, 10X each -wall wash, 1' flexion   12/08/21 [name] Therapy: Completed prior to exercises. Completed myofascial release to left upper arm, upper trapezius and scapularis region.  - P/ROM: shoulder, supine, all ranges, 10X - A/ROM, seated, scapular row, 10X - Therapy ball stretches; flexion, abduction, 10X with 2" hold at end of stretch.  - Pro/elev/dep/ret 1' low level - Isometrics: shoulder, supine, all ranges, 3x5"     GOALS:   SHORT TERM GOALS: Target date: 12/20/2021   Patient will be educated and independent with HEP in order to facilitate her progress in therapy and allow her to return to using her left arm as her dominant extremity for all daily tasks.  Baseline: Goal status: On-Going  2.  Patient will increase  her LUE P/ROM to [facility] in order to increase the ability to complete dressing tasks with less difficulty.  Baseline:  Goal status: On-Going  3.  Patient will increase her LUE strength to 3+/5 in order to increase her ability to complete reaching tasks at or above shoulder level with less difficulty.  Baseline:  Goal status: On-Going  4.  Patient will decrease her LUE fascial restrictions to min amount or less in order to increase the functional mobility needed to complete reaching tasks.  Baseline:  Goal status: On-Going    LONG TERM GOALS: Target date: 01/10/2022    Patient will increase her LUE A/ROM to [facility] in order to complete high level reaching tasks with less difficulty. Baseline:  Goal status: On-Going  2.  Patient will increase her LUE strength to 4+/5 or better in order to return to using her LUE as her dominant extremity while  managing moderate weighted items. Baseline:  Goal status: On-Going  3.  Patient will report a pain level of 2/10 or less in her LUE while utilizing it to complete basic ADL tasks.  Baseline:  Goal status: On-Going    ASSESSMENT:  CLINICAL IMPRESSION: A: Measurements taken for MD appt today. Pt is making progress towards goals, increasing ROM with exception of er for A/ROM and abduction/er for P/ROM. Continued with myofascial release and progressed to AA/ROM today. Verbal cuing for form and technique, also completing wall wash. Updated HEP for AA/ROM.    PLAN: OT FREQUENCY: 2x/week  OT DURATION: 6 weeks  PLANNED INTERVENTIONS: self care/ADL training, therapeutic exercise, therapeutic activity, neuromuscular re-education, passive range of motion, electrical stimulation, ultrasound, moist heat, cryotherapy, patient/family education, and DME and/or AE instructions    CONSULTED AND AGREED WITH PLAN OF CARE: Patient  PLAN FOR NEXT SESSION: P: Follow up on MD appt and HEP, continue with AA/ROM completing in supine first    Guadelupe Sabin, OTR/L  [phone] 12/15/2021, 2:15 PM

## 2021-12-19 ENCOUNTER — Ambulatory Visit: Payer: 59 | Admitting: Orthopedic Surgery

## 2021-12-20 ENCOUNTER — Ambulatory Visit (HOSPITAL_COMMUNITY): Payer: 59 | Attending: Orthopedic Surgery | Admitting: Occupational Therapy

## 2021-12-20 ENCOUNTER — Encounter (HOSPITAL_COMMUNITY): Payer: Self-pay | Admitting: Occupational Therapy

## 2021-12-20 DIAGNOSIS — M25512 Pain in left shoulder: Secondary | ICD-10-CM

## 2021-12-20 DIAGNOSIS — Y939 Activity, unspecified: Secondary | ICD-10-CM | POA: Insufficient documentation

## 2021-12-20 DIAGNOSIS — S42295D Other nondisplaced fracture of upper end of left humerus, subsequent encounter for fracture with routine healing: Secondary | ICD-10-CM | POA: Diagnosis not present

## 2021-12-20 DIAGNOSIS — R29898 Other symptoms and signs involving the musculoskeletal system: Secondary | ICD-10-CM

## 2021-12-20 DIAGNOSIS — M25612 Stiffness of left shoulder, not elsewhere classified: Secondary | ICD-10-CM

## 2021-12-20 DIAGNOSIS — Z5189 Encounter for other specified aftercare: Secondary | ICD-10-CM | POA: Insufficient documentation

## 2021-12-20 NOTE — Patient Instructions (Signed)
  Complete the following 2x a day. Hold for 10 seconds. Complete 1 sets for each.   1) SHOULDER - ISOMETRIC FLEXION  Gently push your fist forward into a wall with your elbow bent.    2) SHOULDER - ISOMETRIC EXTENSION  Gently push your a bent elbow back into a wall.         3) SHOULDER - ISOMETRIC ADDUCTION  Gently push your elbow into the side of your body.

## 2021-12-20 NOTE — Therapy (Signed)
OUTPATIENT OCCUPATIONAL THERAPY ORTHO TREATMENT  Patient Name: Dana Hanson MRN: [id number] DOB:08/25/1960, 61 y.o., female Today's Date: 12/20/2021  PCP: [name], MD REFERRING PROVIDER: Arther Abbott, MD         OT End of Session - 12/20/21 1602     Visit Number 4    Number of Visits 12    Date for OT Re-Evaluation 01/10/22    Authorization Type Generic [employer]    Authorization Time Period $40 copay,no visit limit    OT Start Time 1518    OT Stop Time 1556    OT Time Calculation (min) 38 min    Activity Tolerance Patient tolerated treatment well    Behavior During Therapy WFL for tasks assessed/performed                Past Medical History:  Diagnosis Date   Cancer ([place])    kidney   Gallstone pancreatitis    Hyperlipidemia    IBS (irritable bowel syndrome)    patient denies   Right renal mass    Past Surgical History:  Procedure Laterality Date   CESAREAN SECTION     x 2-tubal with last c-section   CHOLECYSTECTOMY N/A 09/03/2015   Procedure: LAPAROSCOPIC CHOLECYSTECTOMY WITH INTRAOPERATIVE CHOLANGIOGRAM;  Surgeon: Mickeal Skinner, MD;  [facility];  Service: General;  Laterality: N/A;   COLONOSCOPY  08/14/2011   Surgeon: Daneil Dolin, MD; external hemorrhoid tags, likely source of hematochezia, normal rectum, multiple colonic polyps at the base of the cecum, all less than 5 mm, single 4 mm sigmoid polyp, scattered diverticula.  Pathology revealed tubular adenoma in the cecum, hyperplastic polyp in the sigmoid colon.  Recommended repeat colonoscopy in 3 years.   COLONOSCOPY WITH PROPOFOL N/A 11/30/2021   Procedure: COLONOSCOPY WITH PROPOFOL;  Surgeon: Daneil Dolin, MD;  [facility];  Service: Endoscopy;  Laterality: N/A;  1:45pm   HEMORRHOID SURGERY  11/10/2011   Procedure: HEMORRHOIDECTOMY;  Surgeon: Donato Heinz, MD;  [facility]: [facility];  Service: General;  Laterality: N/A;   [place] RESECTION   01/15/2015   LAPAROSCOPIC NEPHRECTOMY N/A 09/03/2015   Procedure: LAPAROSCOPIC RADICAL NEPHRECTOMY;  Surgeon: Ardis Hughs, MD;  [facility];  Service: Urology;  Laterality: N/A;   POLYPECTOMY  11/30/2021   Procedure: POLYPECTOMY;  Surgeon: Daneil Dolin, MD;  [facility];  Service: Endoscopy;;   TUBAL LIGATION     with last c-section   VERTEBROPLASTY N/A 09/09/2021   Procedure: Thoracic vertebral biopsy;  Surgeon: Ashok Pall, MD;  Location: [place];  Service: Neurosurgery;  Laterality: N/A;  RM 21   Patient Active Problem List   Diagnosis Date Noted   History of colonic polyps 10/12/2021   Abnormal PET scan of colon 10/12/2021   Metastatic renal cell carcinoma to bone ([place]) 09/27/2021   Cancer of right kidney ([place]) 09/03/2015   Renal mass 09/03/2015   Pancreatitis 08/21/2015   Biliary obstruction    Primary osteoarthritis of right knee 03/17/2014   Hemorrhoids 08/01/2011   Heme positive stool 08/01/2011   Rectal bleed 08/01/2011    ONSET DATE: 10/30/21  REFERRING DIAG: left shoulder proximal humerus fracture  THERAPY DIAG:  Other symptoms and signs involving the musculoskeletal system  Stiffness of left shoulder joint  Pain in joint of left shoulder   Rationale for Evaluation and Treatment Rehabilitation   SUBJECTIVE:   SUBJECTIVE STATEMENT: S: I can't get comfortable  PERTINENT HISTORY: Patient presenting with a  left closed nondisplaced proximal humerus fracture sustained from a mechanical fall on 10/30/21. She was placed in a sling and reports that she has weaned herself from it. Today she is 4 weeks post injury.  PRECAUTIONS: Shoulder Standard protocol: Week 3-5 (5/7-5/21) AA/ROM (when pain is diminished and pt is less apprehensive). Week 6-8 (5/28-6/11) A/ROM, isometrics, Week 8-10 early strengthening  WEIGHT BEARING RESTRICTIONS Yes NWB LUE  PAIN:  Are you having pain? No: NPRS scale: 0/10 Pain location:  Pain description:   Aggravating factors:  Relieving factors:     OBJECTIVE:    FUNCTIONAL OUTCOME MEASURES: FOTO: 50/100  UE ROM     Active ROM - seated. IR/er adducted Left 11/29/2021 Left 12/15/2021  Shoulder flexion 102 113  Shoulder abduction 85 104  Shoulder internal rotation 85 85  Shoulder external rotation 55 42  (Blank rows = not tested)  Passive ROM - supine. IR/er adducted Left 11/29/2021 Left 12/15/2021  Shoulder flexion 125 136  Shoulder abduction 91 84  Shoulder internal rotation 90 90  Shoulder external rotation 62 42  (Blank rows = not tested)   UE MMT:     MMT - seated. IR/er adducted Left 11/29/2021 Left 12/15/2021  Shoulder flexion 3-/5 3/5  Shoulder abduction 3-/5 3-/5  Shoulder internal rotation 3/5 3/5  Shoulder external rotation 3-/5 3/5  (Blank rows = not tested)     PATIENT EDUCATION: Education details: educated on trialing TENS unit for pain; standing shoulder isometrics Person educated: patient Education method: explanation, demonstration Education comprehension: verbalized understanding, returned demonstration   HOME EXERCISE PROGRAM: Eval: table slides; 6/1 AA/ROM; 6/6: standing shoulder isometrics  TREATMENT:  12/20/21 [name] Therapy: Completed prior to exercises. Completed myofascial release to left upper arm, upper trapezius and scapularis region.  -P/ROM: supine, shoulder flexion, abduction, er/IR, horizontal abduction, 5X each -AA/ROM: supine, shoulder protraction, flexion, er/IR, horizontal abduction, 10X each; abduction 5X each -AA/ROM: seated, shoulder protraction, flexion, er/IR, abduction, horizontal abduction, 10X each -Wall wash: 1' flexion -Pulleys: 1' flexion, 1' abduction -Isometrics: shoulder flexion, extension, adduction, 2X10"   12/15/21 [name] Therapy: Completed prior to exercises. Completed myofascial release to left upper arm, upper trapezius and scapularis region.  - P/ROM: shoulder, supine, all ranges, 10X -AA/ROM:  seated, shoulder protraction, flexion, er/IR, abduction, horizontal abduction, 10X each -wall wash, 1' flexion   12/08/21 [name] Therapy: Completed prior to exercises. Completed myofascial release to left upper arm, upper trapezius and scapularis region.  - P/ROM: shoulder, supine, all ranges, 10X - A/ROM, seated, scapular row, 10X - Therapy ball stretches; flexion, abduction, 10X with 2" hold at end of stretch.  - Pro/elev/dep/ret 1' low level - Isometrics: shoulder, supine, all ranges, 3x5"     GOALS:   SHORT TERM GOALS: Target date: 12/20/2021   Patient will be educated and independent with HEP in order to facilitate her progress in therapy and allow her to return to using her left arm as her dominant extremity for all daily tasks.  Baseline: Goal status: On-Going  2.  Patient will increase her LUE P/ROM to [facility] in order to increase the ability to complete dressing tasks with less difficulty.  Baseline:  Goal status: On-Going  3.  Patient will increase her LUE strength to 3+/5 in order to increase her ability to complete reaching tasks at or above shoulder level with less difficulty.  Baseline:  Goal status: On-Going  4.  Patient will decrease her LUE fascial restrictions to min amount or less in order to increase the  functional mobility needed to complete reaching tasks.  Baseline:  Goal status: On-Going    LONG TERM GOALS: Target date: 01/10/2022    Patient will increase her LUE A/ROM to [facility] in order to complete high level reaching tasks with less difficulty. Baseline:  Goal status: On-Going  2.  Patient will increase her LUE strength to 4+/5 or better in order to return to using her LUE as her dominant extremity while managing moderate weighted items. Baseline:  Goal status: On-Going  3.  Patient will report a pain level of 2/10 or less in her LUE while utilizing it to complete basic ADL tasks.  Baseline:  Goal status: On-Going    ASSESSMENT:  CLINICAL  IMPRESSION: A: Pt reports MD did not have much new to tell her. Continued with myofascial release, pt with trigger points at anterior shoulder and trapezius regions, good response to manual techniques. Added AA/ROM in supine, max difficulty with abduction. Continued with AA/ROM in sitting, improvement in abduction. Added pulleys and standing shoulder isometrics. Pt achieving ROM WFL with pulleys. Verbal cuing for form and technique, also completing wall wash.     PLAN: OT FREQUENCY: 2x/week  OT DURATION: 6 weeks  PLANNED INTERVENTIONS: self care/ADL training, therapeutic exercise, therapeutic activity, neuromuscular re-education, passive range of motion, electrical stimulation, ultrasound, moist heat, cryotherapy, patient/family education, and DME and/or AE instructions    CONSULTED AND AGREED WITH PLAN OF CARE: Patient  PLAN FOR NEXT SESSION: P: Continue progressing with AA/ROM, complete abduction in sitting versus supine     Guadelupe Sabin, OTR/L  [phone] 12/20/2021, 4:03 PM

## 2021-12-22 ENCOUNTER — Ambulatory Visit (HOSPITAL_COMMUNITY): Payer: 59 | Attending: Orthopedic Surgery | Admitting: Occupational Therapy

## 2021-12-22 ENCOUNTER — Encounter (HOSPITAL_COMMUNITY): Payer: Self-pay | Admitting: Occupational Therapy

## 2021-12-22 DIAGNOSIS — M25612 Stiffness of left shoulder, not elsewhere classified: Secondary | ICD-10-CM

## 2021-12-22 DIAGNOSIS — R29898 Other symptoms and signs involving the musculoskeletal system: Secondary | ICD-10-CM | POA: Diagnosis present

## 2021-12-22 DIAGNOSIS — M25512 Pain in left shoulder: Secondary | ICD-10-CM | POA: Insufficient documentation

## 2021-12-22 NOTE — Therapy (Signed)
OUTPATIENT OCCUPATIONAL THERAPY ORTHO TREATMENT  Patient Name: Dana Hanson MRN: [id number] DOB:1961/05/23, 61 y.o., female Today's Date: 12/22/2021  PCP: [name], MD REFERRING PROVIDER: Arther Abbott, MD         OT End of Session - 12/22/21 1515     Visit Number 5    Number of Visits 12    Date for OT Re-Evaluation 01/10/22    Authorization Type Generic [employer]    Authorization Time Period $40 copay,no visit limit    OT Start Time 1350    OT Stop Time 1430    OT Time Calculation (min) 40 min    Activity Tolerance Patient tolerated treatment well    Behavior During Therapy WFL for tasks assessed/performed                 Past Medical History:  Diagnosis Date   Cancer ([place])    kidney   Gallstone pancreatitis    Hyperlipidemia    IBS (irritable bowel syndrome)    patient denies   Right renal mass    Past Surgical History:  Procedure Laterality Date   CESAREAN SECTION     x 2-tubal with last c-section   CHOLECYSTECTOMY N/A 09/03/2015   Procedure: LAPAROSCOPIC CHOLECYSTECTOMY WITH INTRAOPERATIVE CHOLANGIOGRAM;  Surgeon: Mickeal Skinner, MD;  Location: [facility];  Service: General;  Laterality: N/A;   COLONOSCOPY  08/14/2011   Surgeon: Daneil Dolin, MD; external hemorrhoid tags, likely source of hematochezia, normal rectum, multiple colonic polyps at the base of the cecum, all less than 5 mm, single 4 mm sigmoid polyp, scattered diverticula.  Pathology revealed tubular adenoma in the cecum, hyperplastic polyp in the sigmoid colon.  Recommended repeat colonoscopy in 3 years.   COLONOSCOPY WITH PROPOFOL N/A 11/30/2021   Procedure: COLONOSCOPY WITH PROPOFOL;  Surgeon: Daneil Dolin, MD;  [facility];  Service: Endoscopy;  Laterality: N/A;  1:45pm   HEMORRHOID SURGERY  11/10/2011   Procedure: HEMORRHOIDECTOMY;  Surgeon: Donato Heinz, MD;  Location: [facility];  Service: General;  Laterality: N/A;   [place] RESECTION   01/15/2015   LAPAROSCOPIC NEPHRECTOMY N/A 09/03/2015   Procedure: LAPAROSCOPIC RADICAL NEPHRECTOMY;  Surgeon: Ardis Hughs, MD;  [facility];  Service: Urology;  Laterality: N/A;   POLYPECTOMY  11/30/2021   Procedure: POLYPECTOMY;  Surgeon: Daneil Dolin, MD;  [facility];  Service: Endoscopy;;   TUBAL LIGATION     with last c-section   VERTEBROPLASTY N/A 09/09/2021   Procedure: Thoracic vertebral biopsy;  Surgeon: Ashok Pall, MD;  Location: [facility];  Service: Neurosurgery;  Laterality: N/A;  RM 21   Patient Active Problem List   Diagnosis Date Noted   History of colonic polyps 10/12/2021   Abnormal PET scan of colon 10/12/2021   Metastatic renal cell carcinoma to bone ([place]) 09/27/2021   Cancer of right kidney ([place]) 09/03/2015   Renal mass 09/03/2015   Pancreatitis 08/21/2015   Biliary obstruction    Primary osteoarthritis of right knee 03/17/2014   Hemorrhoids 08/01/2011   Heme positive stool 08/01/2011   Rectal bleed 08/01/2011    ONSET DATE: 10/30/21  REFERRING DIAG: left shoulder proximal humerus fracture  THERAPY DIAG:  Other symptoms and signs involving the musculoskeletal system  Stiffness of left shoulder joint  Pain in joint of left shoulder   Rationale for Evaluation and Treatment Rehabilitation   SUBJECTIVE:   SUBJECTIVE STATEMENT: S: I can't get comfortable  PERTINENT HISTORY: Patient presenting with  a left closed nondisplaced proximal humerus fracture sustained from a mechanical fall on 10/30/21. She was placed in a sling and reports that she has weaned herself from it. Today she is 4 weeks post injury.  PRECAUTIONS: Shoulder Standard protocol: Week 3-5 (5/7-5/21) AA/ROM (when pain is diminished and pt is less apprehensive). Week 6-8 (5/28-6/11) A/ROM, isometrics, Week 8-10 early strengthening  WEIGHT BEARING RESTRICTIONS Yes NWB LUE  PAIN:  Are you having pain? Yes: NPRS scale: 8/10 Pain location: elbow and shoulder Pain  description: throbbing Aggravating factors: unsure Relieving factors: TENS, pain medication for a little while     OBJECTIVE:    FUNCTIONAL OUTCOME MEASURES: FOTO: 50/100  UE ROM     Active ROM - seated. IR/er adducted Left 11/29/2021 Left 12/15/2021  Shoulder flexion 102 113  Shoulder abduction 85 104  Shoulder internal rotation 85 85  Shoulder external rotation 55 42  (Blank rows = not tested)  Passive ROM - supine. IR/er adducted Left 11/29/2021 Left 12/15/2021  Shoulder flexion 125 136  Shoulder abduction 91 84  Shoulder internal rotation 90 90  Shoulder external rotation 62 42  (Blank rows = not tested)   UE MMT:     MMT - seated. IR/er adducted Left 11/29/2021 Left 12/15/2021  Shoulder flexion 3-/5 3/5  Shoulder abduction 3-/5 3-/5  Shoulder internal rotation 3/5 3/5  Shoulder external rotation 3-/5 3/5  (Blank rows = not tested)     PATIENT EDUCATION: Education details: educated on trialing TENS unit for pain; standing shoulder isometrics Person educated: patient Education method: explanation, demonstration Education comprehension: verbalized understanding, returned demonstration   HOME EXERCISE PROGRAM: Eval: table slides; 6/1 AA/ROM; 6/6: standing shoulder isometrics  TREATMENT:  12/22/21 -Manual Therapy: Completed prior to exercises. Completed myofascial release to left upper arm, upper trapezius and scapularis region. Retrograde massage to elbow region for edema management.  -P/ROM: supine, shoulder flexion, abduction, er/IR, 5X each -AA/ROM: supine, shoulder protraction, flexion, er/IR, horizontal abduction, 10X each -ES: 16.0 CV, right shoulder, interferential, 15' -Moist Heat: 15' during ES  12/20/21 - Manuel Therapy: Completed prior to exercises. Completed myofascial release to left upper arm, upper trapezius and scapularis region.  -P/ROM: supine, shoulder flexion, abduction, er/IR, horizontal abduction, 5X each -AA/ROM: supine, shoulder  protraction, flexion, er/IR, horizontal abduction, 10X each; abduction 5X each -AA/ROM: seated, shoulder protraction, flexion, er/IR, abduction, horizontal abduction, 10X each -Wall wash: 1' flexion -Pulleys: 1' flexion, 1' abduction -Isometrics: shoulder flexion, extension, adduction, 2X10"   12/15/21 Dana Hanson Therapy: Completed prior to exercises. Completed myofascial release to left upper arm, upper trapezius and scapularis region.  - P/ROM: shoulder, supine, all ranges, 10X -AA/ROM: seated, shoulder protraction, flexion, er/IR, abduction, horizontal abduction, 10X each -wall wash, 1' flexion     GOALS:   SHORT TERM GOALS: Target date: 12/20/2021   Patient will be educated and independent with HEP in order to facilitate her progress in therapy and allow her to return to using her left arm as her dominant extremity for all daily tasks.  Baseline: Goal status: On-Going  2.  Patient will increase her LUE P/ROM to [facility] in order to increase the ability to complete dressing tasks with less difficulty.  Baseline:  Goal status: On-Going  3.  Patient will increase her LUE strength to 3+/5 in order to increase her ability to complete reaching tasks at or above shoulder level with less difficulty.  Baseline:  Goal status: On-Going  4.  Patient will decrease her LUE fascial restrictions to min amount or  less in order to increase the functional mobility needed to complete reaching tasks.  Baseline:  Goal status: On-Going    LONG TERM GOALS: Target date: 01/10/2022    Patient will increase her LUE A/ROM to [facility] in order to complete high level reaching tasks with less difficulty. Baseline:  Goal status: On-Going  2.  Patient will increase her LUE strength to 4+/5 or better in order to return to using her LUE as her dominant extremity while managing moderate weighted items. Baseline:  Goal status: On-Going  3.  Patient will report a pain level of 2/10 or less in her LUE while utilizing  it to complete basic ADL tasks.  Baseline:  Goal status: On-Going    ASSESSMENT:  CLINICAL IMPRESSION: A: Pt reports increased pain today, took Rx pain medication as well as Tylenol last night with minimal relief. Pt describing pain radiating from upper trapezius/cervical down to wrist and hand. Pt noted to have edema around elbow today, manual techniques trialed to address with fair results. Continued with passive stretching and supine AA/ROM, no abduction, then transitioned to pain management focus using TENS. Pt with no relief from ES application. Pt interested in trying dry needling, OT will send referral request to MD. Verbal cuing for form and technique.    PLAN: OT FREQUENCY: 2x/week  OT DURATION: 6 weeks  PLANNED INTERVENTIONS: self care/ADL training, therapeutic exercise, therapeutic activity, neuromuscular re-education, passive range of motion, electrical stimulation, ultrasound, moist heat, cryotherapy, patient/family education, and DME and/or AE instructions    CONSULTED AND AGREED WITH PLAN OF CARE: Patient  PLAN FOR NEXT SESSION: P: Follow up on pain, send referral request for PT dry needling to MD; discontinue manual techniques to see if this is exacerbating pain   Guadelupe Sabin, OTR/L  [phone] 12/22/2021, 3:16 PM

## 2021-12-28 ENCOUNTER — Ambulatory Visit (HOSPITAL_COMMUNITY): Payer: 59 | Attending: Orthopedic Surgery | Admitting: Occupational Therapy

## 2021-12-28 ENCOUNTER — Encounter (HOSPITAL_COMMUNITY): Payer: Self-pay | Admitting: Occupational Therapy

## 2021-12-28 DIAGNOSIS — M25512 Pain in left shoulder: Secondary | ICD-10-CM | POA: Insufficient documentation

## 2021-12-28 DIAGNOSIS — M25612 Stiffness of left shoulder, not elsewhere classified: Secondary | ICD-10-CM | POA: Diagnosis present

## 2021-12-28 DIAGNOSIS — R29898 Other symptoms and signs involving the musculoskeletal system: Secondary | ICD-10-CM | POA: Insufficient documentation

## 2021-12-28 NOTE — Therapy (Signed)
OUTPATIENT OCCUPATIONAL THERAPY ORTHO TREATMENT  Patient Name: Dana Hanson MRN: [id number] DOB:[birth year], 61 y.o., female Today's Date: 12/28/2021  PCP: [name], MD REFERRING PROVIDER: Arther Abbott, MD         OT End of Session - 12/28/21 1036     Visit Number 6    Number of Visits 12    Date for OT Re-Evaluation 01/10/22    Authorization Type Generic [employer]    Authorization Time Period $40 copay,no visit limit    OT Start Time 0947    OT Stop Time 1031    OT Time Calculation (min) 44 min    Activity Tolerance Patient tolerated treatment well    Behavior During Therapy WFL for tasks assessed/performed                  Past Medical History:  Diagnosis Date   Cancer ([place])    kidney   Gallstone pancreatitis    Hyperlipidemia    IBS (irritable bowel syndrome)    patient denies   Right renal mass    Past Surgical History:  Procedure Laterality Date   CESAREAN SECTION     x 2-tubal with last c-section   CHOLECYSTECTOMY N/A 09/03/2015   Procedure: LAPAROSCOPIC CHOLECYSTECTOMY WITH INTRAOPERATIVE CHOLANGIOGRAM;  Surgeon: Mickeal Skinner, MD;  [facility];  Service: General;  Laterality: N/A;   COLONOSCOPY  08/14/2011   Surgeon: Daneil Dolin, MD; external hemorrhoid tags, likely source of hematochezia, normal rectum, multiple colonic polyps at the base of the cecum, all less than 5 mm, single 4 mm sigmoid polyp, scattered diverticula.  Pathology revealed tubular adenoma in the cecum, hyperplastic polyp in the sigmoid colon.  Recommended repeat colonoscopy in 3 years.   COLONOSCOPY WITH PROPOFOL N/A 11/30/2021   Procedure: COLONOSCOPY WITH PROPOFOL;  Surgeon: Daneil Dolin, MD;  [facility];  Service: Endoscopy;  Laterality: N/A;  1:45pm   HEMORRHOID SURGERY  11/10/2011   Procedure: HEMORRHOIDECTOMY;  Surgeon: Donato Heinz, MD;  [facility];  Service: General;  Laterality: N/A;   [place] RESECTION   01/15/2015   LAPAROSCOPIC NEPHRECTOMY N/A 09/03/2015   Procedure: LAPAROSCOPIC RADICAL NEPHRECTOMY;  Surgeon: Ardis Hughs, MD;  [facility];  Service: Urology;  Laterality: N/A;   POLYPECTOMY  11/30/2021   Procedure: POLYPECTOMY;  Surgeon: Daneil Dolin, MD;  [facility];  Service: Endoscopy;;   TUBAL LIGATION     with last c-section   VERTEBROPLASTY N/A 09/09/2021   Procedure: Thoracic vertebral biopsy;  Surgeon: Ashok Pall, MD;  Location: [place];  Service: Neurosurgery;  Laterality: N/A;  RM 21   Patient Active Problem List   Diagnosis Date Noted   History of colonic polyps 10/12/2021   Abnormal PET scan of colon 10/12/2021   Metastatic renal cell carcinoma to bone ([place]) 09/27/2021   Cancer of right kidney ([place]) 09/03/2015   Renal mass 09/03/2015   Pancreatitis 08/21/2015   Biliary obstruction    Primary osteoarthritis of right knee 03/17/2014   Hemorrhoids 08/01/2011   Heme positive stool 08/01/2011   Rectal bleed 08/01/2011    ONSET DATE: 10/30/21  REFERRING DIAG: left shoulder proximal humerus fracture  THERAPY DIAG:  Other symptoms and signs involving the musculoskeletal system  Stiffness of left shoulder joint  Pain in joint of left shoulder   Rationale for Evaluation and Treatment Rehabilitation   SUBJECTIVE:   SUBJECTIVE STATEMENT: S: I feel so much better  PERTINENT HISTORY: Patient  presenting with a left closed nondisplaced proximal humerus fracture sustained from a mechanical fall on 10/30/21. She was placed in a sling and reports that she has weaned herself from it. Today she is 4 weeks post injury.  PRECAUTIONS: Shoulder Standard protocol: Week 6-8 (5/28-6/11) A/ROM, isometrics, Week 8-10 early strengthening  WEIGHT BEARING RESTRICTIONS Yes NWB LUE  PAIN:  Are you having pain? Yes: NPRS scale: 1/10 Pain location: elbow and shoulder Pain description: throbbing Aggravating factors: unsure Relieving factors: TENS, pain  medication for a little while     OBJECTIVE:    FUNCTIONAL OUTCOME MEASURES: FOTO: 50/100  UE ROM     Active ROM - seated. IR/er adducted Left 11/29/2021 Left 12/15/2021  Shoulder flexion 102 113  Shoulder abduction 85 104  Shoulder internal rotation 85 85  Shoulder external rotation 55 42  (Blank rows = not tested)  Passive ROM - supine. IR/er adducted Left 11/29/2021 Left 12/15/2021  Shoulder flexion 125 136  Shoulder abduction 91 84  Shoulder internal rotation 90 90  Shoulder external rotation 62 42  (Blank rows = not tested)   UE MMT:     MMT - seated. IR/er adducted Left 11/29/2021 Left 12/15/2021  Shoulder flexion 3-/5 3/5  Shoulder abduction 3-/5 3-/5  Shoulder internal rotation 3/5 3/5  Shoulder external rotation 3-/5 3/5  (Blank rows = not tested)     PATIENT EDUCATION: Education details: discussed pain management, HEP completion Person educated: patient Education method: explanation Education comprehension: verbalized understanding   HOME EXERCISE PROGRAM: Eval: table slides; 6/1 AA/ROM; 6/6: standing shoulder isometrics  TREATMENT:   12/28/21 -Manual Therapy: Completed prior to exercises. Retrograde massage to elbow region for edema management.  -P/ROM: supine, shoulder flexion, abduction, er/IR, 5X each -AA/ROM: supine, shoulder protraction, flexion, er/IR, horizontal abduction, abduction, 10X each -AA/ROM: standing, shoulder protraction, flexion, er/IR, horizontal abduction, abduction,10X each -Wall wash: 1' flexion -Scapular theraband: red row extension and retraction, 10 reps each  -Overhead Lacing: began lacing at the top, went to bottom an reversed -UBE: level 1, 2' forward, 2' reverse, pace 3.0  12/22/21 -Manual Therapy: Completed prior to exercises. Completed myofascial release to left upper arm, upper trapezius and scapularis region. Retrograde massage to elbow region for edema management.  -P/ROM: supine, shoulder flexion, abduction,  er/IR, 5X each -AA/ROM: supine, shoulder protraction, flexion, er/IR, horizontal abduction, 10X each -ES: 16.0 CV, right shoulder, interferential, 15' -Moist Heat: 15' during ES  12/20/21 - Manuel Therapy: Completed prior to exercises. Completed myofascial release to left upper arm, upper trapezius and scapularis region.  -P/ROM: supine, shoulder flexion, abduction, er/IR, horizontal abduction, 5X each -AA/ROM: supine, shoulder protraction, flexion, er/IR, horizontal abduction, 10X each; abduction 5X each -AA/ROM: seated, shoulder protraction, flexion, er/IR, abduction, horizontal abduction, 10X each -Wall wash: 1' flexion -Pulleys: 1' flexion, 1' abduction -Isometrics: shoulder flexion, extension, adduction, 2X10"      GOALS:   SHORT TERM GOALS: Target date: 12/20/2021   Patient will be educated and independent with HEP in order to facilitate her progress in therapy and allow her to return to using her left arm as her dominant extremity for all daily tasks.  Baseline: Goal status: On-Going  2.  Patient will increase her LUE P/ROM to [facility] in order to increase the ability to complete dressing tasks with less difficulty.  Baseline:  Goal status: On-Going  3.  Patient will increase her LUE strength to 3+/5 in order to increase her ability to complete reaching tasks at or above shoulder level with less difficulty.  Baseline:  Goal status: On-Going  4.  Patient will decrease her LUE fascial restrictions to min amount or less in order to increase the functional mobility needed to complete reaching tasks.  Baseline:  Goal status: On-Going    LONG TERM GOALS: Target date: 01/10/2022    Patient will increase her LUE A/ROM to [facility] in order to complete high level reaching tasks with less difficulty. Baseline:  Goal status: On-Going  2.  Patient will increase her LUE strength to 4+/5 or better in order to return to using her LUE as her dominant extremity while managing moderate weighted  items. Baseline:  Goal status: On-Going  3.  Patient will report a pain level of 2/10 or less in her LUE while utilizing it to complete basic ADL tasks.  Baseline:  Goal status: On-Going    ASSESSMENT:  CLINICAL IMPRESSION: A: Pt reports she is feeling much better today, she iced all weekend and tried to rest her shoulder. Retrograde massage completed, however no myofascial release completed with pt's improved tolerance of exercises throughout session. Completed P/ROM and resumed AA/ROM, pt achieving ROM to approximately 60-70% today. Pt with almost full ROM during wall wash. Added scapular theraband exercises and overhead lacing, UBE today. Pt reports muscle fatigue during overhead lacing. Verbal cuing for form and technique during exercises, occasional min tactile assist for positioning.     PLAN: OT FREQUENCY: 2x/week  OT DURATION: 6 weeks  PLANNED INTERVENTIONS: self care/ADL training, therapeutic exercise, therapeutic activity, neuromuscular re-education, passive range of motion, electrical stimulation, ultrasound, moist heat, cryotherapy, patient/family education, and DME and/or AE instructions    CONSULTED AND AGREED WITH PLAN OF CARE: Patient  PLAN FOR NEXT SESSION: P: no manual techniques, try self-stretching at wall versus P/ROM, progress to A/ROM in supine, standing if able to complete with good form and low pain   Guadelupe Sabin, OTR/L  [phone] 12/28/2021, 10:38 AM

## 2021-12-29 ENCOUNTER — Other Ambulatory Visit: Payer: Self-pay

## 2021-12-29 ENCOUNTER — Inpatient Hospital Stay: Payer: 59

## 2021-12-29 ENCOUNTER — Inpatient Hospital Stay: Payer: 59 | Attending: Oncology | Admitting: Oncology

## 2021-12-29 ENCOUNTER — Ambulatory Visit
Admission: RE | Admit: 2021-12-29 | Discharge: 2021-12-29 | Disposition: A | Discharge status: Home / Self Care | Payer: 59 | Source: Ambulatory Visit | Attending: Radiation Oncology | Admitting: Radiation Oncology

## 2021-12-29 VITALS — BP 100/58 | HR 94 | Temp 97.3°F | Resp 20 | Wt 166.0 lb

## 2021-12-29 DIAGNOSIS — C641 Malignant neoplasm of right kidney, except renal pelvis: Secondary | ICD-10-CM

## 2021-12-29 DIAGNOSIS — Z79899 Other long term (current) drug therapy: Secondary | ICD-10-CM | POA: Insufficient documentation

## 2021-12-29 DIAGNOSIS — Z5112 Encounter for antineoplastic immunotherapy: Secondary | ICD-10-CM | POA: Diagnosis present

## 2021-12-29 DIAGNOSIS — C7951 Secondary malignant neoplasm of bone: Secondary | ICD-10-CM | POA: Insufficient documentation

## 2021-12-29 DIAGNOSIS — D492 Neoplasm of unspecified behavior of bone, soft tissue, and skin: Secondary | ICD-10-CM

## 2021-12-29 LAB — CMP (CANCER CENTER ONLY)
ALT: 40 U/L (ref 0–44)
AST: 32 U/L (ref 15–41)
Albumin: 3.9 g/dL (ref 3.5–5.0)
Alkaline Phosphatase: 66 U/L (ref 38–126)
Anion gap: 6 (ref 5–15)
BUN: 23 mg/dL — ABNORMAL HIGH (ref 6–20)
CO2: 28 mmol/L (ref 22–32)
Calcium: 9.7 mg/dL (ref 8.9–10.3)
Chloride: 108 mmol/L (ref 98–111)
Creatinine: 1.25 mg/dL — ABNORMAL HIGH (ref 0.44–1.00)
GFR, Estimated: 50 mL/min — ABNORMAL LOW (ref >60)
Glucose, Bld: 155 mg/dL — ABNORMAL HIGH (ref 70–99)
Potassium: 4.1 mmol/L (ref 3.5–5.1)
Sodium: 142 mmol/L (ref 135–145)
Total Bilirubin: 0.4 mg/dL (ref 0.3–1.2)
Total Protein: 6.8 g/dL (ref 6.5–8.1)

## 2021-12-29 LAB — CBC WITH DIFFERENTIAL (CANCER CENTER ONLY)
Abs Immature Granulocytes: 0.01 10*3/uL (ref 0.00–0.07)
Basophils Absolute: 0.1 10*3/uL (ref 0.0–0.1)
Basophils Relative: 1 %
Eosinophils Absolute: 0.3 10*3/uL (ref 0.0–0.5)
Eosinophils Relative: 4 %
HCT: 37 % (ref 36.0–46.0)
Hemoglobin: 12.2 g/dL (ref 12.0–15.0)
Immature Granulocytes: 0 %
Lymphocytes Relative: 28 %
Lymphs Abs: 1.7 10*3/uL (ref 0.7–4.0)
MCH: 28.9 pg (ref 26.0–34.0)
MCHC: 33 g/dL (ref 30.0–36.0)
MCV: 87.7 fL (ref 80.0–100.0)
Monocytes Absolute: 0.4 10*3/uL (ref 0.1–1.0)
Monocytes Relative: 6 %
Neutro Abs: 3.7 10*3/uL (ref 1.7–7.7)
Neutrophils Relative %: 61 %
Platelet Count: 311 10*3/uL (ref 150–400)
RBC: 4.22 MIL/uL (ref 3.87–5.11)
RDW: 12.7 % (ref 11.5–15.5)
WBC Count: 6.1 10*3/uL (ref 4.0–10.5)
nRBC: 0 % (ref 0.0–0.2)

## 2021-12-29 LAB — TSH: TSH: 0.656 u[IU]/mL (ref 0.350–4.500)

## 2021-12-29 IMAGING — MR MR THORACIC SPINE WO/W CM
5 of 9 series · 24 of 48 positions shown · IV contrast (15 ml multihance)
Comparison: [DATE]

CLINICAL DATA: Follow-up treated metastatic disease to the thoracic
spine. SRS to T6 metastasis completed [DATE].

EXAM:
MRI THORACIC WITHOUT AND WITH CONTRAST
TECHNIQUE: Multiplanar and multiecho pulse sequences of the thoracic spine were
obtained without and with intravenous contrast.
CONTRAST:  15mL MULTIHANCE GADOBENATE DIMEGLUMINE 529 MG/ML IV SOLN

[Series 17: T1 · sagittal · 3.0mm · 0.83mm/px · 3 of 15 slices shown (1 of 2)]
[im 1/15]
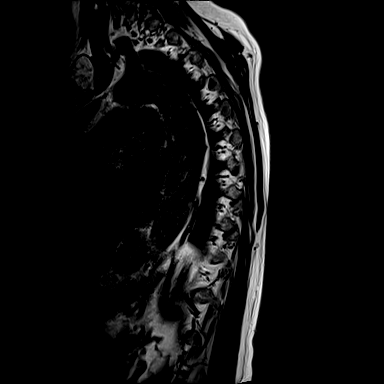
[im 8/15]
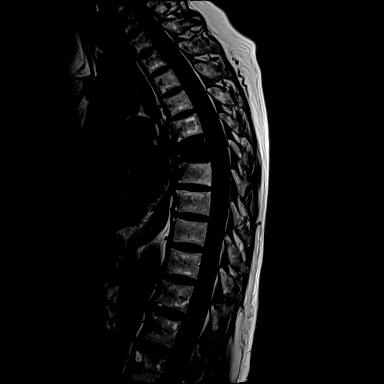
[im 15/15]
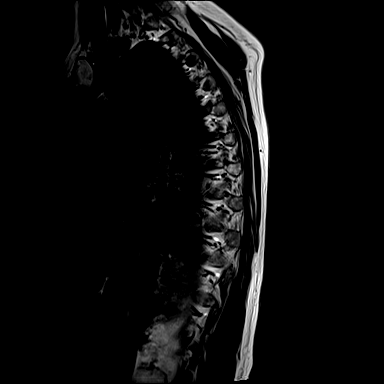

[Series 19: T2 · axial · 4.0mm · 0.28mm/px · z∈[-217,-56]mm · 8 of 33 slices shown]
[im 1/33]
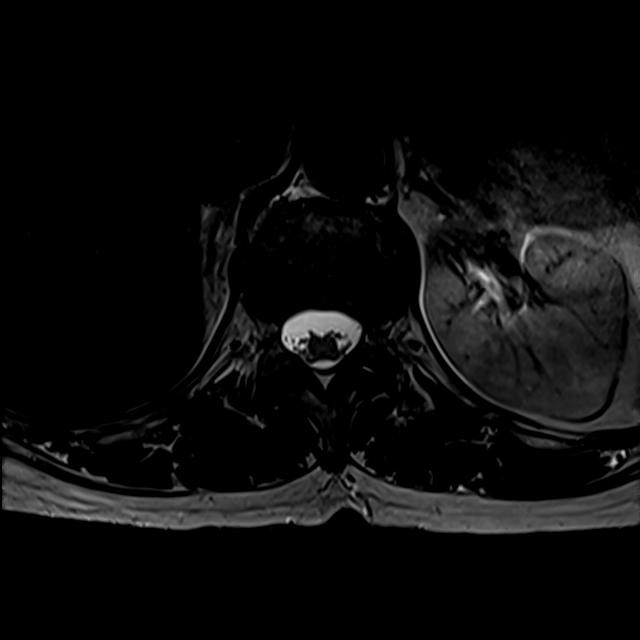
[im 5/33]
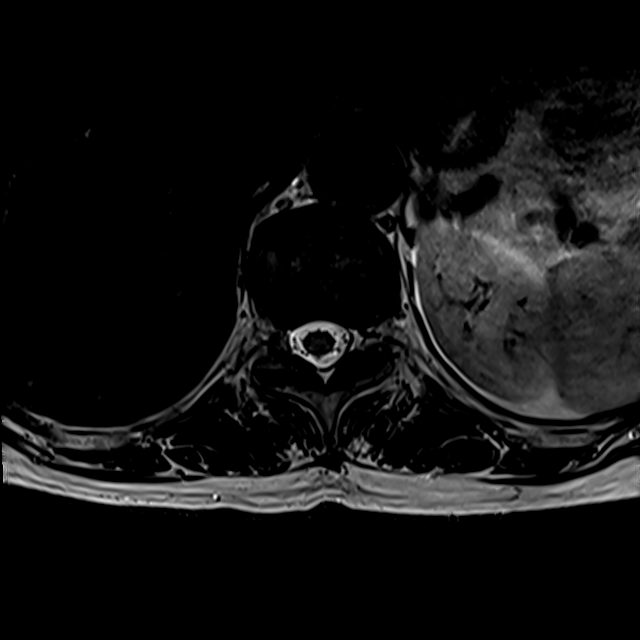
[im 10/33]
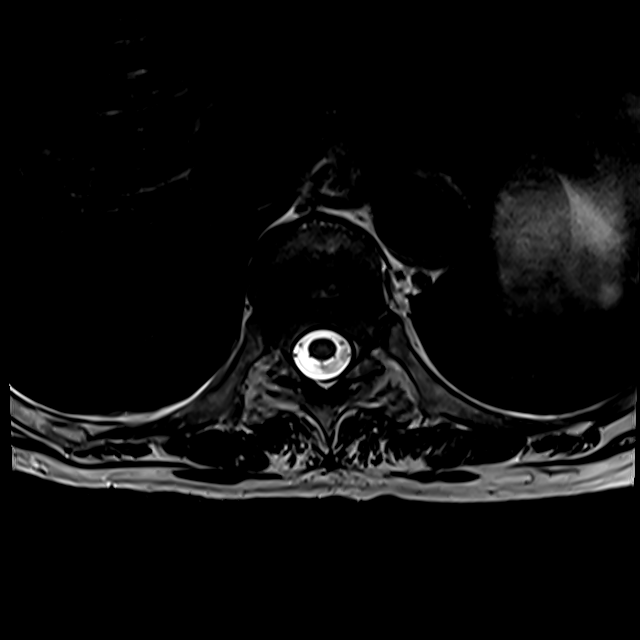
[im 14/33]
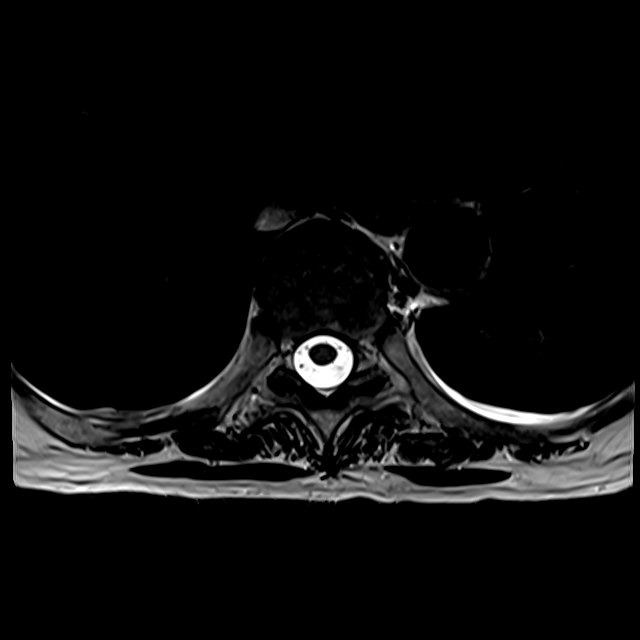
[im 19/33]
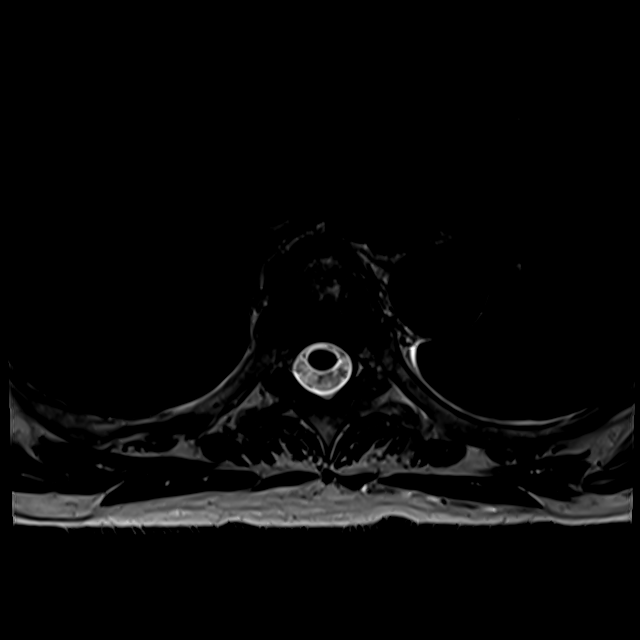
[im 23/33]
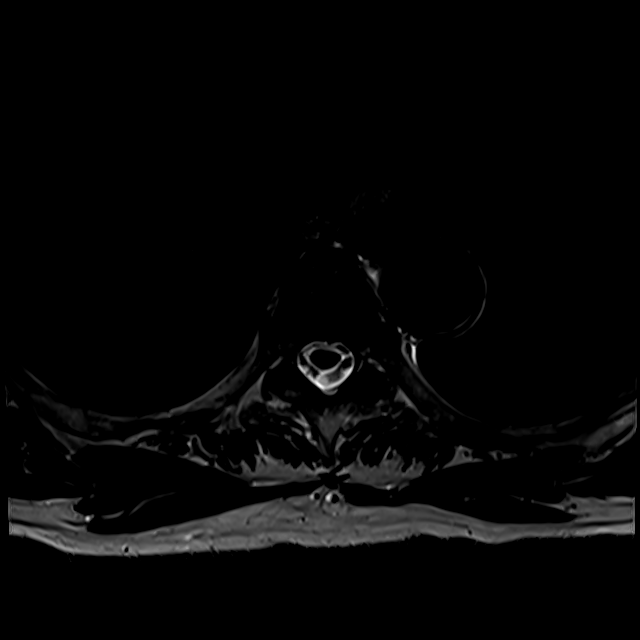
[im 28/33]
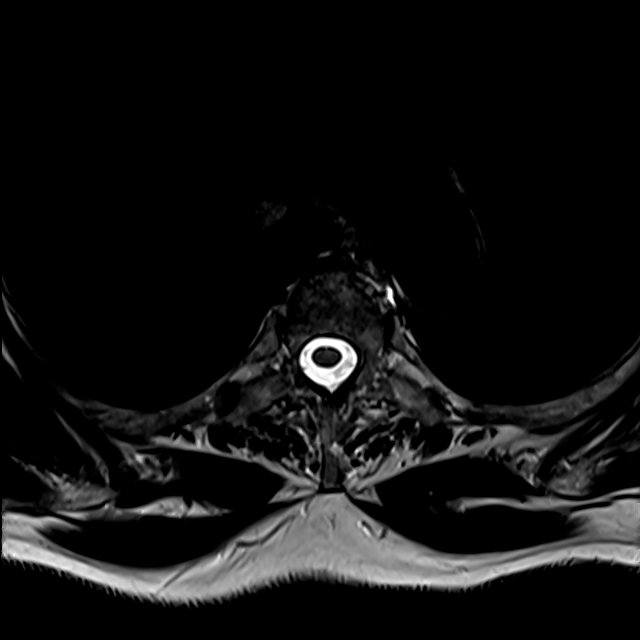
[im 33/33]
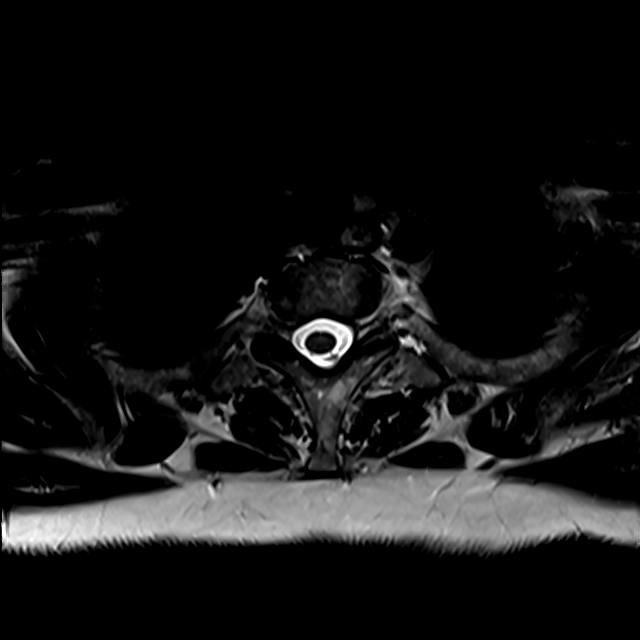

[Series 21: T1 · axial · non-contrast · 4.0mm · 0.56mm/px · z∈[-217,-56]mm · 8 of 33 slices shown (2 of 2)]
[im 1/33]
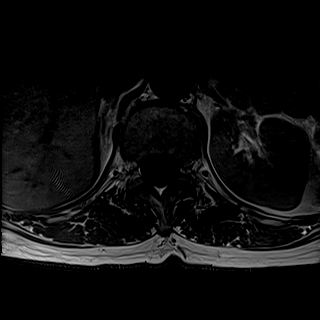
[im 5/33]
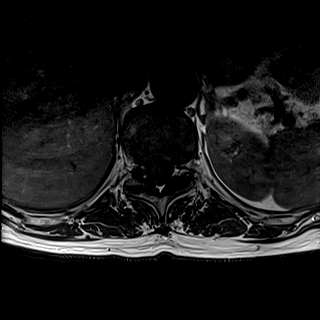
[im 10/33]
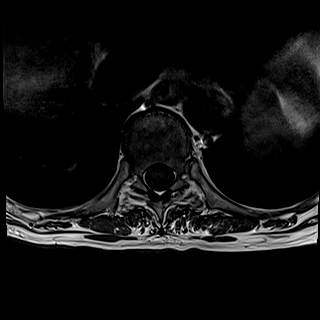
[im 14/33]
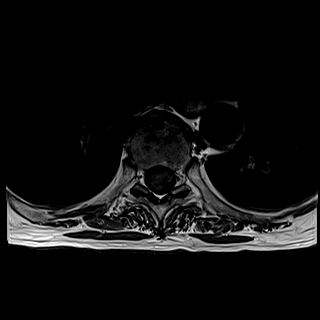
[im 19/33]
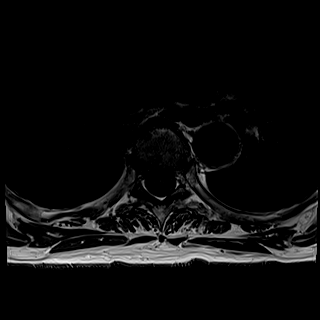
[im 23/33]
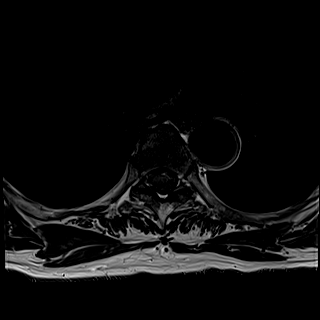
[im 28/33]
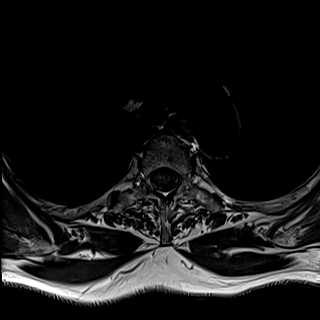
[im 33/33]
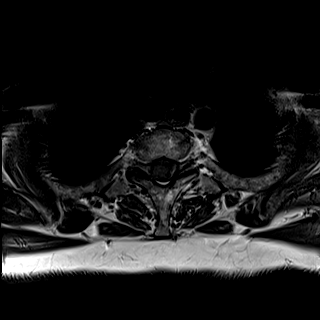

[Series 22: T2 post-contrast · sagittal · 3.0mm · 0.83mm/px · 4 of 15 slices shown]
[im 1/15]
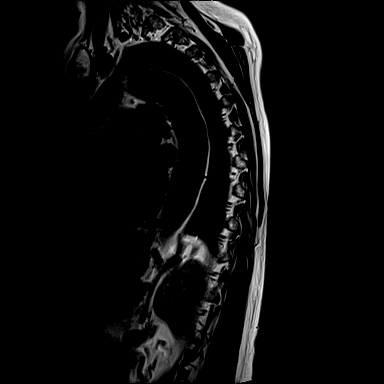
[im 5/15]
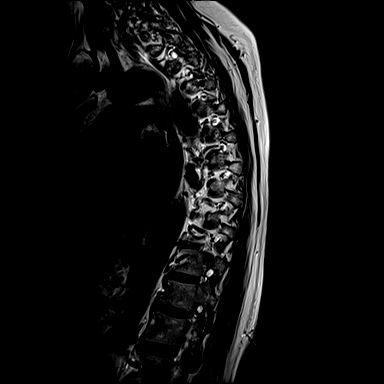
[im 10/15]
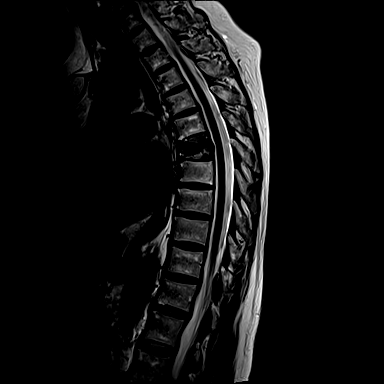
[im 15/15]
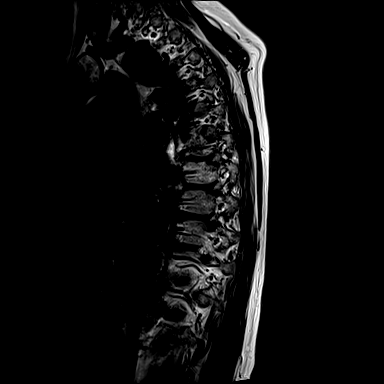

[Series 23: T1 fat-sat · sagittal · 3.0mm · 1.00mm/px · 1 of 15 slices shown]
[im 1/15]
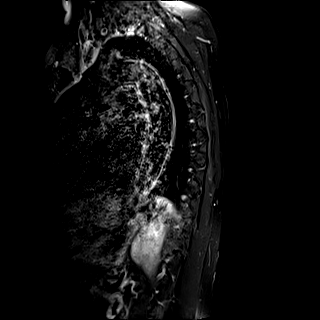

[24 of 48 positions shown; findings below may reference images not displayed]

FINDINGS: Alignment:  Stable.  No listhesis.

Vertebrae: T1 hypointensity with enhancement throughout the
compressed T6 vertebra. The area of T1 hypointensity is diminished
with restoration of fatty marrow in the posterior body. The right
pedicle has also normalized in signal. Given the degree of residual
enhancement, presence of viable tumor versus successfully treated
tumor is indeterminate. No extraosseous tumor. Resolved signal
abnormality to T7 level. No new areas of metastatic involvement.

Cord:  Normal signal and morphology.

Paraspinal and other soft tissues: Negative for perispinal mass or
inflammation.

Disc levels:

No significant degenerative change or neural impingement.
IMPRESSION: Treated T6 metastasis with decreased enhancing area. No extraosseous
extension or new level of metastatic disease.

## 2021-12-29 MED ORDER — SODIUM CHLORIDE 0.9 % IV SOLN: Freq: Once | INTRAVENOUS | Status: AC

## 2021-12-29 MED ORDER — SODIUM CHLORIDE 0.9 % IV SOLN
200.0000 mg | Freq: Once | INTRAVENOUS | Status: AC
  Administered 2021-12-29: 200 mg via INTRAVENOUS
  Filled 2021-12-29: qty 200

## 2021-12-29 MED ORDER — GADOBENATE DIMEGLUMINE 529 MG/ML IV SOLN
15.0000 mL | Freq: Once | INTRAVENOUS | Status: AC | PRN
  Administered 2021-12-29: 15 mL via INTRAVENOUS

## 2021-12-29 NOTE — Progress Notes (Signed)
Hematology and Oncology Follow Up Visit  Dana Hanson [id number] [birth year] 61 y.o. 12/29/2021 11:34 AM Dana Hanson, MDFagan, Dana Manner, MD   Principle Diagnosis: 86 year old woman with kidney cancer diagnosed in 2017.  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     Prior Therapy:  She is status post laparoscopic right radical nephrectomy performed in February 2017.  She was found to have 6.5 cm clear-cell renal cell carcinoma.  She is status post surgical biopsy completed by Dr. Christella Noa on September 09, 2021 of the T6 spinal lesion which confirmed the presence of metastatic renal cell carcinoma.  She is status post spinal stereotactic radiosurgery completed on September 27, 2021 for isolated metastatic lesion of the T6 spine.   Current therapy: Pembrolizumab 200 mg every 3 weeks cycle 1 started on Nov 18, 2021.  She is here for cycle 3 of therapy.  Interim History: Dana Hanson presents today for repeat evaluation.  Since last visit, she reports feeling well without any major complaints.  She denies any nausea, vomiting or abdominal pain.  She denies any skin rashes or lesions.  She denies any hospitalizations or illnesses.     Medications: Reviewed without changes. Current Outpatient Medications  Medication Sig Dispense Refill   acetaminophen (TYLENOL) 325 MG tablet Take 2 tablets (650 mg total) by mouth every 6 (six) hours as needed for moderate pain. 30 tablet 0   HYDROcodone-acetaminophen (NORCO/VICODIN) 5-325 MG tablet Take 1 tablet by mouth every 6 (six) hours as needed for moderate pain. 30 tablet 0   Multiple Vitamin (MULITIVITAMIN WITH MINERALS) TABS Take 1 tablet by mouth daily.     polyethylene glycol-electrolytes (TRILYTE) 420 g solution Take 4,000 mLs by mouth as directed. (Patient not taking: Reported on 12/09/2021) 4000 mL 0   pregabalin (LYRICA) 50 MG capsule Take 50 mg by mouth 3 (three) times daily.     No current  facility-administered medications for this visit.     Allergies: No Known Allergies    Physical Exam:  Blood pressure (!) 100/58, pulse 94, temperature (!) 97.3 F (36.3 C), resp. rate 20, weight 166 lb (75.3 kg), last menstrual period 10/27/2013, SpO2 99 %.   ECOG: 1   General appearance: Alert, awake without any distress. Head: Atraumatic without abnormalities Oropharynx: Without any thrush or ulcers. Eyes: No scleral icterus. Lymph nodes: No lymphadenopathy noted in the cervical, supraclavicular, or axillary nodes Heart:regular rate and rhythm, without any murmurs or gallops.   Lung: Clear to auscultation without any rhonchi, wheezes or dullness to percussion. Abdomin: Soft, nontender without any shifting dullness or ascites. Musculoskeletal: No clubbing or cyanosis. Neurological: No motor or sensory deficits. Skin: No rashes or lesions.         Lab Results: Lab Results  Component Value Date   WBC 6.1 12/29/2021   HGB 12.2 12/29/2021   HCT 37.0 12/29/2021   MCV 87.7 12/29/2021   PLT 311 12/29/2021     Chemistry      Component Value Date/Time   NA 141 12/09/2021 0848   K 3.7 12/09/2021 0848   CL 108 12/09/2021 0848   CO2 26 12/09/2021 0848   BUN 23 (H) 12/09/2021 0848   CREATININE 0.89 12/09/2021 0848      Component Value Date/Time   CALCIUM 10.0 12/09/2021 0848   ALKPHOS 74 12/09/2021 0848   AST 38 12/09/2021 0848   ALT 28 12/09/2021 0848   BILITOT 0.7 12/09/2021 0848  Impression and Plan:  51 year old with:  1.  Kidney cancer diagnosed in January 2023.  She was found to have stage IV clear-cell renal cell carcinoma   She is currently on Pembrolizumab without any major complications.  Risks and benefits of continuing this treatment to complete 1 year were reviewed today.  Updating her staging scans in September 2023 will also be required.  After discussion today, she is agreeable to proceed.  Switching to every 6 weeks scheduling  would also be a consideration.  She will think about this option after the next visit.    2.  Thoracic spine metastasis: MRI of the spine was repeated today and results are currently pending.  3.  Autoimmune complications: She has not experienced any complications at this time including pneumonitis, colitis and thyroid disease.   4.  Follow-up: In 3 weeks for repeat follow-up.  30  minutes were spent on this encounter.  The time was dedicated to reviewing laboratory data, disease status update and outlining future plan of care discussion.    Zola Button, MD 6/15/202311:34 AM

## 2021-12-30 ENCOUNTER — Encounter (HOSPITAL_COMMUNITY): Payer: Self-pay | Admitting: Occupational Therapy

## 2021-12-30 ENCOUNTER — Ambulatory Visit (HOSPITAL_COMMUNITY): Payer: 59 | Attending: Orthopedic Surgery | Admitting: Occupational Therapy

## 2021-12-30 DIAGNOSIS — X58XXXD Exposure to other specified factors, subsequent encounter: Secondary | ICD-10-CM | POA: Diagnosis not present

## 2021-12-30 DIAGNOSIS — S42295D Other nondisplaced fracture of upper end of left humerus, subsequent encounter for fracture with routine healing: Secondary | ICD-10-CM | POA: Diagnosis present

## 2021-12-30 DIAGNOSIS — R29898 Other symptoms and signs involving the musculoskeletal system: Secondary | ICD-10-CM

## 2021-12-30 DIAGNOSIS — M25512 Pain in left shoulder: Secondary | ICD-10-CM

## 2021-12-30 DIAGNOSIS — M25612 Stiffness of left shoulder, not elsewhere classified: Secondary | ICD-10-CM

## 2021-12-30 NOTE — Therapy (Signed)
OUTPATIENT OCCUPATIONAL THERAPY ORTHO TREATMENT  Patient Name: Dana Hanson MRN: [id number] DOB:[birth year], 61 y.o., female Today's Date: 12/30/2021  PCP: [name], MD REFERRING PROVIDER: Arther Abbott, MD         OT End of Session - 12/30/21 1504     Visit Number 7    Number of Visits 12    Date for OT Re-Evaluation 01/10/22    Authorization Type Generic [employer]    Authorization Time Period $40 copay,no visit limit    OT Start Time 1350    OT Stop Time 1430    OT Time Calculation (min) 40 min    Activity Tolerance Patient tolerated treatment well    Behavior During Therapy WFL for tasks assessed/performed                   Past Medical History:  Diagnosis Date   Cancer ([place])    kidney   Gallstone pancreatitis    Hyperlipidemia    IBS (irritable bowel syndrome)    patient denies   Right renal mass    Past Surgical History:  Procedure Laterality Date   CESAREAN SECTION     x 2-tubal with last c-section   CHOLECYSTECTOMY N/A 09/03/2015   Procedure: LAPAROSCOPIC CHOLECYSTECTOMY WITH INTRAOPERATIVE CHOLANGIOGRAM;  Surgeon: Mickeal Skinner, MD;  Location: [facility];  Service: General;  Laterality: N/A;   COLONOSCOPY  08/14/2011   Surgeon: Daneil Dolin, MD; external hemorrhoid tags, likely source of hematochezia, normal rectum, multiple colonic polyps at the base of the cecum, all less than 5 mm, single 4 mm sigmoid polyp, scattered diverticula.  Pathology revealed tubular adenoma in the cecum, hyperplastic polyp in the sigmoid colon.  Recommended repeat colonoscopy in 3 years.   COLONOSCOPY WITH PROPOFOL N/A 11/30/2021   Procedure: COLONOSCOPY WITH PROPOFOL;  Surgeon: Daneil Dolin, MD;  [facility];  Service: Endoscopy;  Laterality: N/A;  1:45pm   HEMORRHOID SURGERY  11/10/2011   Procedure: HEMORRHOIDECTOMY;  Surgeon: Donato Heinz, MD;  Location: [facility];  Service: General;  Laterality: N/A;   [place] RESECTION   01/15/2015   LAPAROSCOPIC NEPHRECTOMY N/A 09/03/2015   Procedure: LAPAROSCOPIC RADICAL NEPHRECTOMY;  Surgeon: Ardis Hughs, MD;  [facility]: [facility];  Service: Urology;  Laterality: N/A;   POLYPECTOMY  11/30/2021   Procedure: POLYPECTOMY;  Surgeon: Daneil Dolin, MD;  [facility];  Service: Endoscopy;;   TUBAL LIGATION     with last c-section   VERTEBROPLASTY N/A 09/09/2021   Procedure: Thoracic vertebral biopsy;  Surgeon: Ashok Pall, MD;  Location: [facility];  Service: Neurosurgery;  Laterality: N/A;  RM 21   Patient Active Problem List   Diagnosis Date Noted   History of colonic polyps 10/12/2021   Abnormal PET scan of colon 10/12/2021   Metastatic renal cell carcinoma to bone ([place]) 09/27/2021   Cancer of right kidney ([place]) 09/03/2015   Renal mass 09/03/2015   Pancreatitis 08/21/2015   Biliary obstruction    Primary osteoarthritis of right knee 03/17/2014   Hemorrhoids 08/01/2011   Heme positive stool 08/01/2011   Rectal bleed 08/01/2011    ONSET DATE: 10/30/21  REFERRING DIAG: left shoulder proximal humerus fracture  THERAPY DIAG:  Other symptoms and signs involving the musculoskeletal system  Stiffness of left shoulder joint  Pain in joint of left shoulder   Rationale for Evaluation and Treatment Rehabilitation   SUBJECTIVE:   SUBJECTIVE STATEMENT: S: I have not been feeling any pain today  and I have been doing so much   PERTINENT HISTORY: Patient presenting with a left closed nondisplaced proximal humerus fracture sustained from a mechanical fall on 10/30/21. She was placed in a sling and reports that she has weaned herself from it. Today she is 4 weeks post injury.  PRECAUTIONS: Shoulder Standard protocol: Week 6-8 (5/28-6/11) A/ROM, isometrics, Week 8-10 early strengthening  WEIGHT BEARING RESTRICTIONS Yes NWB LUE  PAIN:  Are you having pain? No   OBJECTIVE:    FUNCTIONAL OUTCOME MEASURES: FOTO: 50/100  UE ROM     Active ROM - seated.  IR/er adducted Left 11/29/2021 Left 12/15/2021  Shoulder flexion 102 113  Shoulder abduction 85 104  Shoulder internal rotation 85 85  Shoulder external rotation 55 42  (Blank rows = not tested)  Passive ROM - supine. IR/er adducted Left 11/29/2021 Left 12/15/2021  Shoulder flexion 125 136  Shoulder abduction 91 84  Shoulder internal rotation 90 90  Shoulder external rotation 62 42  (Blank rows = not tested)   UE MMT:     MMT - seated. IR/er adducted Left 11/29/2021 Left 12/15/2021  Shoulder flexion 3-/5 3/5  Shoulder abduction 3-/5 3-/5  Shoulder internal rotation 3/5 3/5  Shoulder external rotation 3-/5 3/5  (Blank rows = not tested)     PATIENT EDUCATION: Education details: discussed pain management, HEP completion Person educated: patient Education method: explanation Education comprehension: verbalized understanding   HOME EXERCISE PROGRAM: Eval: table slides; 6/1 AA/ROM; 6/6: standing shoulder isometrics  TREATMENT:   12/30/21 - Manual therapy: completed prior to exercises tolerated well, facial grimacing noted  - P/ROM: supine, shoulder flexion, abduction, er/IR, 5X each - AA/ROM: supine, shoulder protraction, flexion, horizontal abduction, abduction, 10X each - Stretching: standing at wall for shoulder flexion and abduction 10 second hold x3 reps each direction  - Functional reaching- to facilitation shoulder flexion and abduction- pinching clip and picking up sponges reaching and placing on  elevated surface.  - Scapular theraband- red row extension and retraction, 10 reps each 2x  12/28/21 -Manual Therapy: Completed prior to exercises. Retrograde massage to elbow region for edema management.  -P/ROM: supine, shoulder flexion, abduction, er/IR, 5X each -AA/ROM: supine, shoulder protraction, flexion, er/IR, horizontal abduction, abduction, 10X each -AA/ROM: standing, shoulder protraction, flexion, er/IR, horizontal abduction, abduction,10X each -Wall wash: 1'  flexion -Scapular theraband: red row extension and retraction, 10 reps each  -Overhead Lacing: began lacing at the top, went to bottom an reversed -UBE: level 1, 2' forward, 2' reverse, pace 3.0  12/22/21 -Manual Therapy: Completed prior to exercises. Completed myofascial release to left upper arm, upper trapezius and scapularis region. Retrograde massage to elbow region for edema management.  -P/ROM: supine, shoulder flexion, abduction, er/IR, 5X each -AA/ROM: supine, shoulder protraction, flexion, er/IR, horizontal abduction, 10X each -ES: 16.0 CV, right shoulder, interferential, 15' -Moist Heat: 15' during ES        GOALS:   SHORT TERM GOALS: Target date: 12/20/2021   Patient will be educated and independent with HEP in order to facilitate her progress in therapy and allow her to return to using her left arm as her dominant extremity for all daily tasks.  Baseline: Goal status: On-Going  2.  Patient will increase her LUE P/ROM to [facility] in order to increase the ability to complete dressing tasks with less difficulty.  Baseline:  Goal status: On-Going  3.  Patient will increase her LUE strength to 3+/5 in order to increase her ability to complete reaching tasks at or  above shoulder level with less difficulty.  Baseline:  Goal status: On-Going  4.  Patient will decrease her LUE fascial restrictions to min amount or less in order to increase the functional mobility needed to complete reaching tasks.  Baseline:  Goal status: On-Going    LONG TERM GOALS: Target date: 01/10/2022    Patient will increase her LUE A/ROM to [facility] in order to complete high level reaching tasks with less difficulty. Baseline:  Goal status: On-Going  2.  Patient will increase her LUE strength to 4+/5 or better in order to return to using her LUE as her dominant extremity while managing moderate weighted items. Baseline:  Goal status: On-Going  3.  Patient will report a pain level of 2/10 or less in her  LUE while utilizing it to complete basic ADL tasks.  Baseline:  Goal status: On-Going    ASSESSMENT:  CLINICAL IMPRESSION: A: Pt. Reports that she has been experiencing less pain and has been able to do more tasks around the house this week. Began session with manual therapy, pt able to tolerate increased pressure and increased time for manual therapy this session. Noted tightness in shoulder during P/ROM for shoulder abduction. Completed shoulder flexion and abduction stretch standing at wall. Targeted shoulder flexion and horizontal abduction with functional reaching task. Demonstrated lateral neck flexion stretch to decrease upper trapezius. Verbal cuing for form and technique during exercises, occasional min tactile assist for positioning.     PLAN: OT FREQUENCY: 2x/week  OT DURATION: 6 weeks  PLANNED INTERVENTIONS: self care/ADL training, therapeutic exercise, therapeutic activity, neuromuscular re-education, passive range of motion, electrical stimulation, ultrasound, moist heat, cryotherapy, patient/family education, and DME and/or AE instructions    CONSULTED AND AGREED WITH PLAN OF CARE: Patient  PLAN FOR NEXT SESSION: P: self-stretching at wall versus P/ROM, progress to A/ROM in supine, scapular theraband exercises, clip tree for reaching   Arvil Persons, OTR/L  [phone] 12/30/2021, 3:06 PM

## 2022-01-02 ENCOUNTER — Inpatient Hospital Stay: Payer: 59

## 2022-01-02 ENCOUNTER — Ambulatory Visit (HOSPITAL_COMMUNITY): Payer: 59 | Admitting: Occupational Therapy

## 2022-01-02 ENCOUNTER — Encounter (HOSPITAL_COMMUNITY): Payer: Self-pay | Admitting: Occupational Therapy

## 2022-01-02 DIAGNOSIS — R29898 Other symptoms and signs involving the musculoskeletal system: Secondary | ICD-10-CM

## 2022-01-02 DIAGNOSIS — M25512 Pain in left shoulder: Secondary | ICD-10-CM

## 2022-01-02 DIAGNOSIS — M25612 Stiffness of left shoulder, not elsewhere classified: Secondary | ICD-10-CM

## 2022-01-02 NOTE — Therapy (Signed)
OUTPATIENT OCCUPATIONAL THERAPY ORTHO TREATMENT  Patient Name: Dana Hanson MRN: [id number] DOB:1961-05-16, 61 y.o., female Today's Date: 01/02/2022  PCP: [name], MD REFERRING PROVIDER: Arther Abbott, MD         OT End of Session - 01/02/22 1427     Visit Number 8    Number of Visits 12    Date for OT Re-Evaluation 01/10/22    Authorization Type Generic [employer]    Authorization Time Period $40 copay,no visit limit    OT Start Time 1301    OT Stop Time 1343    OT Time Calculation (min) 42 min    Activity Tolerance Patient tolerated treatment well    Behavior During Therapy WFL for tasks assessed/performed                    Past Medical History:  Diagnosis Date   Cancer ([place])    kidney   Gallstone pancreatitis    Hyperlipidemia    IBS (irritable bowel syndrome)    patient denies   Right renal mass    Past Surgical History:  Procedure Laterality Date   CESAREAN SECTION     x 2-tubal with last c-section   CHOLECYSTECTOMY N/A 09/03/2015   Procedure: LAPAROSCOPIC CHOLECYSTECTOMY WITH INTRAOPERATIVE CHOLANGIOGRAM;  Surgeon: Mickeal Skinner, MD;  [facility];  Service: General;  Laterality: N/A;   COLONOSCOPY  08/14/2011   Surgeon: Daneil Dolin, MD; external hemorrhoid tags, likely source of hematochezia, normal rectum, multiple colonic polyps at the base of the cecum, all less than 5 mm, single 4 mm sigmoid polyp, scattered diverticula.  Pathology revealed tubular adenoma in the cecum, hyperplastic polyp in the sigmoid colon.  Recommended repeat colonoscopy in 3 years.   COLONOSCOPY WITH PROPOFOL N/A 11/30/2021   Procedure: COLONOSCOPY WITH PROPOFOL;  Surgeon: Daneil Dolin, MD;  [facility];  Service: Endoscopy;  Laterality: N/A;  1:45pm   HEMORRHOID SURGERY  11/10/2011   Procedure: HEMORRHOIDECTOMY;  Surgeon: Donato Heinz, MD;  [facility];  Service: General;  Laterality: N/A;   [place] RESECTION   01/15/2015   LAPAROSCOPIC NEPHRECTOMY N/A 09/03/2015   Procedure: LAPAROSCOPIC RADICAL NEPHRECTOMY;  Surgeon: Ardis Hughs, MD;  [facility];  Service: Urology;  Laterality: N/A;   POLYPECTOMY  11/30/2021   Procedure: POLYPECTOMY;  Surgeon: Daneil Dolin, MD;  [facility];  Service: Endoscopy;;   TUBAL LIGATION     with last c-section   VERTEBROPLASTY N/A 09/09/2021   Procedure: Thoracic vertebral biopsy;  Surgeon: Ashok Pall, MD;  Location: [facility];  Service: Neurosurgery;  Laterality: N/A;  RM 21   Patient Active Problem List   Diagnosis Date Noted   History of colonic polyps 10/12/2021   Abnormal PET scan of colon 10/12/2021   Metastatic renal cell carcinoma to bone ([place]) 09/27/2021   Cancer of right kidney ([place]) 09/03/2015   Renal mass 09/03/2015   Pancreatitis 08/21/2015   Biliary obstruction    Primary osteoarthritis of right knee 03/17/2014   Hemorrhoids 08/01/2011   Heme positive stool 08/01/2011   Rectal bleed 08/01/2011    ONSET DATE: 10/30/21  REFERRING DIAG: left shoulder proximal humerus fracture  THERAPY DIAG:  Other symptoms and signs involving the musculoskeletal system  Stiffness of left shoulder joint  Pain in joint of left shoulder   Rationale for Evaluation and Treatment Rehabilitation   SUBJECTIVE:   SUBJECTIVE STATEMENT: S: I have noticed some small changes, being  able to lift and reach a little easier  PERTINENT HISTORY: Patient presenting with a left closed nondisplaced proximal humerus fracture sustained from a mechanical fall on 10/30/21. She was placed in a sling and reports that she has weaned herself from it. Today she is 4 weeks post injury.  PRECAUTIONS: Shoulder Standard protocol: Week 6-8 (5/28-6/11) A/ROM, isometrics, Week 8-10 early strengthening  WEIGHT BEARING RESTRICTIONS Yes NWB LUE  PAIN:  Are you having pain? No   OBJECTIVE:    FUNCTIONAL OUTCOME MEASURES: FOTO: 50/100  UE ROM     Active ROM  - seated. IR/er adducted Left 11/29/2021 Left 12/15/2021  Shoulder flexion 102 113  Shoulder abduction 85 104  Shoulder internal rotation 85 85  Shoulder external rotation 55 42  (Blank rows = not tested)  Passive ROM - supine. IR/er adducted Left 11/29/2021 Left 12/15/2021  Shoulder flexion 125 136  Shoulder abduction 91 84  Shoulder internal rotation 90 90  Shoulder external rotation 62 42  (Blank rows = not tested)   UE MMT:     MMT - seated. IR/er adducted Left 11/29/2021 Left 12/15/2021  Shoulder flexion 3-/5 3/5  Shoulder abduction 3-/5 3-/5  Shoulder internal rotation 3/5 3/5  Shoulder external rotation 3-/5 3/5  (Blank rows = not tested)     PATIENT EDUCATION: Education details: Shoulder A/ROM Person educated: patient Education method: explanation and demonstration Education comprehension: verbalized understanding, returned demonstration   HOME EXERCISE PROGRAM: Eval: table slides; 6/1 AA/ROM; 6/6: standing shoulder isometrics; 6/19: shoulder A/ROM  TREATMENT:   01/02/22 -Manual therapy: completed prior to exercise, upper trapezius region, proximal extensors, biceps -P/ROM: seated, shoulder flexion, abduction, er/IR 5 reps each direction  -A/ROM: supine, shoulder flexion, abduction, er/IR 10 reps each direction  -Stretching: standing at wall for shoulder flexion and abduction 15 second hold x3 reps each direction  -Scapular Theraband: red row extension, abducted retraction and row, 1x15 reps each direction  -Overhead Lacing: began lacing at the top, went to bottom and reversed  -Functional Reach: shoulder flexion to place 16 items above head height and shoulder abduction to retrieve items   12/30/21 - Manual therapy: completed prior to exercises tolerated well, facial grimacing noted  - P/ROM: supine, shoulder flexion, abduction, er/IR, 5X each - AA/ROM: supine, shoulder protraction, flexion, horizontal abduction, abduction, 10X each - Stretching: standing at  wall for shoulder flexion and abduction 10 second hold x3 reps each direction  - Functional reaching- to facilitation shoulder flexion and abduction- pinching clip and picking up sponges reaching and placing on  elevated surface.  - Scapular theraband- red row extension and retraction, 10 reps each 2x  12/28/21 -Manual Therapy: Completed prior to exercises. Retrograde massage to elbow region for edema management.  -P/ROM: supine, shoulder flexion, abduction, er/IR, 5X each -AA/ROM: supine, shoulder protraction, flexion, er/IR, horizontal abduction, abduction, 10X each -AA/ROM: standing, shoulder protraction, flexion, er/IR, horizontal abduction, abduction,10X each -Wall wash: 1' flexion -Scapular theraband: red row extension and retraction, 10 reps each  -Overhead Lacing: began lacing at the top, went to bottom an reversed -UBE: level 1, 2' forward, 2' reverse, pace 3.0     GOALS:   SHORT TERM GOALS: Target date: 12/20/2021   Patient will be educated and independent with HEP in order to facilitate her progress in therapy and allow her to return to using her left arm as her dominant extremity for all daily tasks.  Baseline: Goal status: On-Going  2.  Patient will increase her LUE P/ROM to [facility] in order  to increase the ability to complete dressing tasks with less difficulty.  Baseline:  Goal status: On-Going  3.  Patient will increase her LUE strength to 3+/5 in order to increase her ability to complete reaching tasks at or above shoulder level with less difficulty.  Baseline:  Goal status: On-Going  4.  Patient will decrease her LUE fascial restrictions to min amount or less in order to increase the functional mobility needed to complete reaching tasks.  Baseline:  Goal status: On-Going    LONG TERM GOALS: Target date: 01/10/2022    Patient will increase her LUE A/ROM to [facility] in order to complete high level reaching tasks with less difficulty. Baseline:  Goal status:  On-Going  2.  Patient will increase her LUE strength to 4+/5 or better in order to return to using her LUE as her dominant extremity while managing moderate weighted items. Baseline:  Goal status: On-Going  3.  Patient will report a pain level of 2/10 or less in her LUE while utilizing it to complete basic ADL tasks.  Baseline:  Goal status: On-Going    ASSESSMENT:  CLINICAL IMPRESSION: A: Pt tolerated manual therapy with tight muscles fibers found in the upper trapezius region and proximal extensors, some release noted. Pt continues with P/ROM greater than A/ROM, reporting that she feels her shoulder "lock up at times" limiting available A/ROM. Therapist providing min verbal and tactile cuing for muscle recruitment with TheraBand scapular exercises, introducing abducted retraction. Completed functional reach to encourage shoulder A/ROM with pt able to achieve shoulder flexion and abduction WFL with use of door as vertical surface. Progressed HEP to include shoulder A/ROM with pt demonstrating proper form.    PLAN: OT FREQUENCY: 2x/week  OT DURATION: 6 weeks  PLANNED INTERVENTIONS: self care/ADL training, therapeutic exercise, therapeutic activity, neuromuscular re-education, passive range of motion, electrical stimulation, ultrasound, moist heat, cryotherapy, patient/family education, and DME and/or AE instructions    CONSULTED AND AGREED WITH PLAN OF CARE: Patient  PLAN FOR NEXT SESSION: P: self-stretching at wall versus P/ROM, progress to A/ROM in supine, clip tree for reaching      Guadelupe Sabin, OTR/L  [phone]  01/02/2022, 2:31 PM

## 2022-01-02 NOTE — Patient Instructions (Signed)
Repeat all exercises 10-15 times, 1-2 times per day.  1) Shoulder Flexion  Supine:     Standing:         Begin with arms at your side with thumbs pointed up, slowly raise both arms up and forward towards overhead.         2) Horizontal abduction/adduction  Supine:   Standing:           Begin with arms straight out in front of you, bring out to the side in at "T" shape. Keep arms straight entire time.       3) Shoulder Abduction  Supine:     Standing:       Lying on your back begin with your arms flat on the table next to your side. Slowly move your arms out to the side so that they go overhead, in a jumping jack or snow angel movement.

## 2022-01-03 ENCOUNTER — Encounter: Payer: Self-pay | Admitting: Urology

## 2022-01-03 NOTE — Progress Notes (Signed)
Telephone appointment. I verified patient's identity and began nursing interview. No issues reported at this time.  Meaningful use complete.  Reminded patient of her 9:00am-01/04/22 telephone appointment w/ Ashlyn Bruning PA-C. I left my extension [phone] in case patient needs anything. Patient verbalized understanding.  Patient contact [phone]

## 2022-01-04 ENCOUNTER — Other Ambulatory Visit: Payer: Self-pay | Admitting: Radiation Therapy

## 2022-01-04 ENCOUNTER — Encounter (HOSPITAL_COMMUNITY): Payer: Self-pay | Admitting: Occupational Therapy

## 2022-01-04 ENCOUNTER — Ambulatory Visit (HOSPITAL_COMMUNITY): Payer: 59 | Attending: Internal Medicine | Admitting: Occupational Therapy

## 2022-01-04 ENCOUNTER — Ambulatory Visit
Admission: RE | Admit: 2022-01-04 | Discharge: 2022-01-04 | Disposition: A | Discharge status: Home / Self Care | Payer: 59 | Source: Ambulatory Visit | Attending: Urology | Admitting: Urology

## 2022-01-04 DIAGNOSIS — M25512 Pain in left shoulder: Secondary | ICD-10-CM | POA: Diagnosis present

## 2022-01-04 DIAGNOSIS — C649 Malignant neoplasm of unspecified kidney, except renal pelvis: Secondary | ICD-10-CM

## 2022-01-04 DIAGNOSIS — R29898 Other symptoms and signs involving the musculoskeletal system: Secondary | ICD-10-CM | POA: Insufficient documentation

## 2022-01-04 DIAGNOSIS — M25612 Stiffness of left shoulder, not elsewhere classified: Secondary | ICD-10-CM | POA: Diagnosis present

## 2022-01-04 DIAGNOSIS — C7951 Secondary malignant neoplasm of bone: Secondary | ICD-10-CM

## 2022-01-04 NOTE — Progress Notes (Signed)
Radiation Oncology         [phone] ________________________________  Name: Dana Hanson MRN: [id number]  Date: 01/04/2022  DOB: 1961/02/04  Post Treatment Note  CC: Dana Noble, MD  Dana Noble, MD  Diagnosis:    61 yo woman with isolated T6-T7 oligometastasis from right renal cell carcinoma  Interval Since Last Radiation:  5 weeks  10/04/21: The oligometastatic lesion at T6 was treated to 18 Gy in a single fraction while a secondary lower prescription dose of 15 Gy was delivered to the entirety of each directly involved marrow compartment    Narrative:  I spoke with the patient to conduct her routine scheduled 1 month follow up visit via telephone to spare the patient unnecessary potential exposure in the healthcare setting during the current COVID-19 pandemic.  The patient was notified in advance and gave permission to proceed with this visit format.  She tolerated the stereotactic radiosurgery well with only modest fatigue.                              On review of systems, the patient states that she is doing well in general.  She has had significant improvement in her back pain and is no longer requiring pain medications for that.  However, unfortunately, she had a recent fall at home and sustained to hairline fractures in her left humerus but did not require ORIF.  She is under the care of Dr. Aline Brochure and is recovering well, in a sling for immobilization but had a good follow-up visit this week demonstrating excellent healing so she is getting ready to start some physical therapy next week.  She reports that her energy level is gradually improving and overall, she is quite pleased with the progress to date.  ALLERGIES:  has No Known Allergies.  Meds: Current Outpatient Medications  Medication Sig Dispense Refill   acetaminophen (TYLENOL) 325 MG tablet Take 2 tablets (650 mg total) by mouth every 6 (six) hours as needed for moderate pain. 30 tablet 0    HYDROcodone-acetaminophen (NORCO/VICODIN) 5-325 MG tablet Take 1 tablet by mouth every 6 (six) hours as needed for moderate pain. 30 tablet 0   Multiple Vitamin (MULITIVITAMIN WITH MINERALS) TABS Take 1 tablet by mouth daily.     polyethylene glycol-electrolytes (TRILYTE) 420 g solution Take 4,000 mLs by mouth as directed. (Patient not taking: Reported on 12/09/2021) 4000 mL 0   pregabalin (LYRICA) 50 MG capsule Take 50 mg by mouth 3 (three) times daily.     No current facility-administered medications for this encounter.    Physical Findings:  vitals were not taken for this visit.  Pain Assessment Pain Score: 0-No pain/10 Unable to assess due to telephone follow-up visit format.  Lab Findings: Lab Results  Component Value Date   WBC 6.1 12/29/2021   HGB 12.2 12/29/2021   HCT 37.0 12/29/2021   MCV 87.7 12/29/2021   PLT 311 12/29/2021     Radiographic Findings: MR THORACIC SPINE W WO CONTRAST  Result Date: 12/29/2021 CLINICAL DATA:  Follow-up treated metastatic disease to the thoracic spine. SRS to T6 metastasis completed 10/04/2021. EXAM: MRI THORACIC WITHOUT AND WITH CONTRAST TECHNIQUE: Multiplanar and multiecho pulse sequences of the thoracic spine were obtained without and with intravenous contrast. CONTRAST:  45m MULTIHANCE GADOBENATE DIMEGLUMINE 529 MG/ML IV SOLN COMPARISON:  09/26/2021 FINDINGS: Alignment:  Stable.  No listhesis. Vertebrae: T1 hypointensity with enhancement throughout the compressed T6 vertebra. The  area of T1 hypointensity is diminished with restoration of fatty marrow in the posterior body. The right pedicle has also normalized in signal. Given the degree of residual enhancement, presence of viable tumor versus successfully treated tumor is indeterminate. No extraosseous tumor. Resolved signal abnormality to T7 level. No new areas of metastatic involvement. Cord:  Normal signal and morphology. Paraspinal and other soft tissues: Negative for perispinal mass or  inflammation. Disc levels: No significant degenerative change or neural impingement. IMPRESSION: Treated T6 metastasis with decreased enhancing area. No extraosseous extension or new level of metastatic disease. Electronically Signed   By: Jorje Guild M.D.   On: 12/29/2021 22:29    Impression/Plan: 1.  61 yo woman with isolated T6-T7 oligometastasis from right renal cell carcinoma. She has recovered well from the effects of her recent stereotactic radiosurgery and is currently without complaints.  She has continued in routine follow-up with Dr. Alen Blew and is tolerating the [facility] immunotherapy very well, recently completed her 3rd cycle.  We reviewed her recent follow-up MRI thoracic spine from 12/29/21 which shows an excellent response to treatment at T6-T7 and no evidence of disease recurrence or progression.  Therefore, we will proceed with serial MRI spine imaging every 6 months going forward. She appears to have a good understanding of these recommendations and is comfortable and in agreement with the stated plan.  I will plan to follow-up with her by telephone following each scan to review results and recommendations from the multidisciplinary brain and spine tumor conference.  She knows that she is welcome to call anytime in the interim with any questions or concerns related to radiation.    Nicholos Johns, PA-C

## 2022-01-04 NOTE — Therapy (Signed)
OUTPATIENT OCCUPATIONAL THERAPY ORTHO TREATMENT  Patient Name: Dana Hanson MRN: [id number] DOB:08/24/1960, 61 y.o., female Today's Date: 01/04/2022  PCP: [name], MD REFERRING PROVIDER: Arther Abbott, MD         OT End of Session - 01/04/22 1046     Visit Number 9    Number of Visits 12    Date for OT Re-Evaluation 01/10/22    Authorization Type Generic [employer]    Authorization Time Period $40 copay,no visit limit    OT Start Time 0951    OT Stop Time 1033    OT Time Calculation (min) 42 min    Activity Tolerance Patient tolerated treatment well    Behavior During Therapy WFL for tasks assessed/performed                     Past Medical History:  Diagnosis Date   Cancer ([place])    kidney   Gallstone pancreatitis    Hyperlipidemia    IBS (irritable bowel syndrome)    patient denies   Right renal mass    Past Surgical History:  Procedure Laterality Date   CESAREAN SECTION     x 2-tubal with last c-section   CHOLECYSTECTOMY N/A 09/03/2015   Procedure: LAPAROSCOPIC CHOLECYSTECTOMY WITH INTRAOPERATIVE CHOLANGIOGRAM;  Surgeon: Mickeal Skinner, MD;  Location: [facility];  Service: General;  Laterality: N/A;   COLONOSCOPY  08/14/2011   Surgeon: Daneil Dolin, MD; external hemorrhoid tags, likely source of hematochezia, normal rectum, multiple colonic polyps at the base of the cecum, all less than 5 mm, single 4 mm sigmoid polyp, scattered diverticula.  Pathology revealed tubular adenoma in the cecum, hyperplastic polyp in the sigmoid colon.  Recommended repeat colonoscopy in 3 years.   COLONOSCOPY WITH PROPOFOL N/A 11/30/2021   Procedure: COLONOSCOPY WITH PROPOFOL;  Surgeon: Daneil Dolin, MD;  Location: [facility];  Service: Endoscopy;  Laterality: N/A;  1:45pm   HEMORRHOID SURGERY  11/10/2011   Procedure: HEMORRHOIDECTOMY;  Surgeon: Donato Heinz, MD;  Location: [facility];  Service: General;  Laterality: N/A;   [place]  RESECTION  01/15/2015   LAPAROSCOPIC NEPHRECTOMY N/A 09/03/2015   Procedure: LAPAROSCOPIC RADICAL NEPHRECTOMY;  Surgeon: Ardis Hughs, MD;  Location: [facility];  Service: Urology;  Laterality: N/A;   POLYPECTOMY  11/30/2021   Procedure: POLYPECTOMY;  Surgeon: Daneil Dolin, MD;  Location: [facility];  Service: Endoscopy;;   TUBAL LIGATION     with last c-section   VERTEBROPLASTY N/A 09/09/2021   Procedure: Thoracic vertebral biopsy;  Surgeon: Ashok Pall, MD;  Location: [place];  Service: Neurosurgery;  Laterality: N/A;  RM 21   Patient Active Problem List   Diagnosis Date Noted   History of colonic polyps 10/12/2021   Abnormal PET scan of colon 10/12/2021   Metastatic renal cell carcinoma to bone ([place]) 09/27/2021   Cancer of right kidney ([place]) 09/03/2015   Renal mass 09/03/2015   Pancreatitis 08/21/2015   Biliary obstruction    Primary osteoarthritis of right knee 03/17/2014   Hemorrhoids 08/01/2011   Heme positive stool 08/01/2011   Rectal bleed 08/01/2011    ONSET DATE: 10/30/21  REFERRING DIAG: left shoulder proximal humerus fracture  THERAPY DIAG:  No diagnosis found.   Rationale for Evaluation and Treatment Rehabilitation   SUBJECTIVE:   SUBJECTIVE STATEMENT: S: I was able to put my bra on with both arms and I almost was able to buckle my seatbelt with my left arm  PERTINENT HISTORY: Patient presenting with a left closed nondisplaced proximal humerus fracture sustained from a mechanical fall on 10/30/21. She was placed in a sling and reports that she has weaned herself from it. Today she is 4 weeks post injury.  PRECAUTIONS: Shoulder Standard protocol: Week 6-8 (5/28-6/11) A/ROM, isometrics, Week 8-10 early strengthening  WEIGHT BEARING RESTRICTIONS Yes NWB LUE  PAIN:  Are you having pain? Yes 5/10 pain at the shoulder with activity "Stiff"   OBJECTIVE:    FUNCTIONAL OUTCOME MEASURES: FOTO: 50/100  UE ROM     Active ROM - seated. IR/er adducted  Left 11/29/2021 Left 12/15/2021  Shoulder flexion 102 113  Shoulder abduction 85 104  Shoulder internal rotation 85 85  Shoulder external rotation 55 42  (Blank rows = not tested)  Passive ROM - supine. IR/er adducted Left 11/29/2021 Left 12/15/2021  Shoulder flexion 125 136  Shoulder abduction 91 84  Shoulder internal rotation 90 90  Shoulder external rotation 62 42  (Blank rows = not tested)   UE MMT:     MMT - seated. IR/er adducted Left 11/29/2021 Left 12/15/2021  Shoulder flexion 3-/5 3/5  Shoulder abduction 3-/5 3-/5  Shoulder internal rotation 3/5 3/5  Shoulder external rotation 3-/5 3/5  (Blank rows = not tested)     PATIENT EDUCATION: Education details: Shoulder A/ROM Person educated: patient Education method: explanation and demonstration Education comprehension: verbalized understanding, returned demonstration   HOME EXERCISE PROGRAM: Eval: table slides; 6/1 AA/ROM; 6/6: standing shoulder isometrics; 6/19: shoulder A/ROM  TREATMENT:   01/04/22 -Manual therapy: completed to address muscle tightness and decrease pain prior to exercise. Posterior shoulder, upper trapezius, full length of extensors and biceps -P/ROM: Pt in supine, shoulder flexion and abduction x 10 reps  -Shoulder stretching: standing at wall for shoulder flexion and abduction 15 second hold x3 reps each direction. Doorframe stretch with shoulder abducted to 45 degrees, minimal resistance -Scapular TheraBand: green, row extension, abducted retraction and row, 1x10 reps each direction  -Functional Reach: Shoulder flexion to remove and place 15 items on top shelf, above head height   01/02/22 -Manual therapy: completed prior to exercise, upper trapezius region, proximal extensors, biceps -P/ROM: seated, shoulder flexion, abduction, er/IR 5 reps each direction  -A/ROM: supine, shoulder flexion, abduction, er/IR 10 reps each direction  -Stretching: standing at wall for shoulder flexion and abduction  15 second hold x3 reps each direction  -Scapular Theraband: red row extension, abducted retraction and row, 1x15 reps each direction  -Overhead Lacing: began lacing at the top, went to bottom and reversed  -Functional Reach: shoulder flexion to place 16 items above head height and shoulder abduction to retrieve items   12/30/21 - Manual therapy: completed prior to exercises tolerated well, facial grimacing noted  - P/ROM: supine, shoulder flexion, abduction, er/IR, 5X each - AA/ROM: supine, shoulder protraction, flexion, horizontal abduction, abduction, 10X each - Stretching: standing at wall for shoulder flexion and abduction 10 second hold x3 reps each direction  - Functional reaching- to facilitation shoulder flexion and abduction- pinching clip and picking up sponges reaching and placing on  elevated surface.  - Scapular theraband- red row extension and retraction, 10 reps each 2x       GOALS:   SHORT TERM GOALS: Target date: 12/20/2021   Patient will be educated and independent with HEP in order to facilitate her progress in therapy and allow her to return to using her left arm as her dominant extremity for all daily tasks.  Baseline: Goal status:  On-Going  2.  Patient will increase her LUE P/ROM to [facility] in order to increase the ability to complete dressing tasks with less difficulty.  Baseline:  Goal status: On-Going  3.  Patient will increase her LUE strength to 3+/5 in order to increase her ability to complete reaching tasks at or above shoulder level with less difficulty.  Baseline:  Goal status: On-Going  4.  Patient will decrease her LUE fascial restrictions to min amount or less in order to increase the functional mobility needed to complete reaching tasks.  Baseline:  Goal status: On-Going    LONG TERM GOALS: Target date: 01/10/2022    Patient will increase her LUE A/ROM to [facility] in order to complete high level reaching tasks with less difficulty. Baseline:  Goal  status: On-Going  2.  Patient will increase her LUE strength to 4+/5 or better in order to return to using her LUE as her dominant extremity while managing moderate weighted items. Baseline:  Goal status: On-Going  3.  Patient will report a pain level of 2/10 or less in her LUE while utilizing it to complete basic ADL tasks.  Baseline:  Goal status: On-Going    ASSESSMENT:  CLINICAL IMPRESSION: A: Pt continues to benefit from manual therapy and P/ROM completed prior to A/ROM to decrease muscle tightness and pain. She continues to report a "locking" in her shoulder with A/ROM abduction, however, decreased with therapist providing blocking at proximal shoulder with P/ROM. Pt tolerated increased resistance with theraband scapular exercises today, minimal tactile cues for form provided. Functional active range of motion achieved with shoulder flexion, pt able to retreive items with minimal pain.     PLAN: OT FREQUENCY: 2x/week  OT DURATION: 6 weeks  PLANNED INTERVENTIONS: self care/ADL training, therapeutic exercise, therapeutic activity, neuromuscular re-education, passive range of motion, electrical stimulation, ultrasound, moist heat, cryotherapy, patient/family education, and DME and/or AE instructions    CONSULTED AND AGREED WITH PLAN OF CARE: Patient  PLAN FOR NEXT SESSION: P: Reassess and discharge (Pt to start with PT in July)     Guadelupe Sabin, OTR/L  [phone]  01/04/2022, 10:48 AM

## 2022-01-05 ENCOUNTER — Encounter (HOSPITAL_COMMUNITY): Payer: 59 | Admitting: Occupational Therapy

## 2022-01-10 ENCOUNTER — Encounter (HOSPITAL_COMMUNITY): Payer: 59 | Admitting: Occupational Therapy

## 2022-01-12 ENCOUNTER — Ambulatory Visit (HOSPITAL_COMMUNITY): Payer: 59

## 2022-01-12 ENCOUNTER — Telehealth (HOSPITAL_COMMUNITY): Payer: Self-pay

## 2022-01-12 ENCOUNTER — Encounter (HOSPITAL_COMMUNITY): Payer: 59

## 2022-01-12 NOTE — Telephone Encounter (Signed)
Called pt regarding no show appointment today, pt agreeable to reschedule for 01/13/22 at 9am.

## 2022-01-13 ENCOUNTER — Encounter (HOSPITAL_COMMUNITY): Payer: Self-pay

## 2022-01-13 ENCOUNTER — Ambulatory Visit (HOSPITAL_COMMUNITY): Payer: 59

## 2022-01-13 DIAGNOSIS — M25512 Pain in left shoulder: Secondary | ICD-10-CM

## 2022-01-13 DIAGNOSIS — M25612 Stiffness of left shoulder, not elsewhere classified: Secondary | ICD-10-CM

## 2022-01-13 DIAGNOSIS — R29898 Other symptoms and signs involving the musculoskeletal system: Secondary | ICD-10-CM

## 2022-01-13 NOTE — Patient Instructions (Signed)

## 2022-01-13 NOTE — Therapy (Signed)
OUTPATIENT OCCUPATIONAL THERAPY ORTHO Progress Note and Discharge   Patient Name: Dana Hanson MRN: [id number] DOB:1960-09-09, 61 y.o., female Today's Date: 01/13/2022  PCP: [name], MD REFERRING PROVIDER: Arther Abbott, MD         OT End of Session - 01/13/22 0905     Visit Number 10    Number of Visits 12    Date for OT Re-Evaluation 01/10/22    Authorization Type Generic [employer]    Authorization Time Period $40 copay,no visit limit    OT Start Time 0904    OT Stop Time 0938    OT Time Calculation (min) 34 min    Activity Tolerance Patient tolerated treatment well    Behavior During Therapy [facility] for tasks assessed/performed                     Past Medical History:  Diagnosis Date   Cancer ([place])    kidney   Gallstone pancreatitis    Hyperlipidemia    IBS (irritable bowel syndrome)    patient denies   Right renal mass    Past Surgical History:  Procedure Laterality Date   CESAREAN SECTION     x 2-tubal with last c-section   CHOLECYSTECTOMY N/A 09/03/2015   Procedure: LAPAROSCOPIC CHOLECYSTECTOMY WITH INTRAOPERATIVE CHOLANGIOGRAM;  Surgeon: Mickeal Skinner, MD;  Location: [facility];  Service: General;  Laterality: N/A;   COLONOSCOPY  08/14/2011   Surgeon: Daneil Dolin, MD; external hemorrhoid tags, likely source of hematochezia, normal rectum, multiple colonic polyps at the base of the cecum, all less than 5 mm, single 4 mm sigmoid polyp, scattered diverticula.  Pathology revealed tubular adenoma in the cecum, hyperplastic polyp in the sigmoid colon.  Recommended repeat colonoscopy in 3 years.   COLONOSCOPY WITH PROPOFOL N/A 11/30/2021   Procedure: COLONOSCOPY WITH PROPOFOL;  Surgeon: Daneil Dolin, MD;  Location: [facility];  Service: Endoscopy;  Laterality: N/A;  1:45pm   HEMORRHOID SURGERY  11/10/2011   Procedure: HEMORRHOIDECTOMY;  Surgeon: Donato Heinz, MD;  Location: [facility];  Service: General;  Laterality: N/A;   [place] RESECTION  01/15/2015   LAPAROSCOPIC NEPHRECTOMY N/A 09/03/2015   Procedure: LAPAROSCOPIC RADICAL NEPHRECTOMY;  Surgeon: Ardis Hughs, MD;  Location: [facility];  Service: Urology;  Laterality: N/A;   POLYPECTOMY  11/30/2021   Procedure: POLYPECTOMY;  Surgeon: Daneil Dolin, MD;  [facility]: [facility];  Service: Endoscopy;;   TUBAL LIGATION     with last c-section   VERTEBROPLASTY N/A 09/09/2021   Procedure: Thoracic vertebral biopsy;  Surgeon: Ashok Pall, MD;  Location: [facility];  Service: Neurosurgery;  Laterality: N/A;  RM 21   Patient Active Problem List   Diagnosis Date Noted   History of colonic polyps 10/12/2021   Abnormal PET scan of colon 10/12/2021   Metastatic renal cell carcinoma to bone ([place]) 09/27/2021   Cancer of right kidney ([place]) 09/03/2015   Renal mass 09/03/2015   Pancreatitis 08/21/2015   Biliary obstruction    Primary osteoarthritis of right knee 03/17/2014   Hemorrhoids 08/01/2011   Heme positive stool 08/01/2011   Rectal bleed 08/01/2011    ONSET DATE: 10/30/21  REFERRING DIAG: left shoulder proximal humerus fracture  THERAPY DIAG:  Other symptoms and signs involving the musculoskeletal system  Stiffness of left shoulder joint  Pain in joint of left shoulder   Rationale for Evaluation and Treatment Rehabilitation   SUBJECTIVE:   SUBJECTIVE STATEMENT: S: I am  dong so much better. It's amazing. I have so much more mobility and much less pain. I can put my seatbelt on and I've been washing dishes with this arm.   PERTINENT HISTORY: Patient presenting with a left closed nondisplaced proximal humerus fracture sustained from a mechanical fall on 10/30/21. She was placed in a sling and reports that she has weaned herself from it. Today she is 4 weeks post injury.  PRECAUTIONS: Shoulder Standard protocol: Week 6-8 (5/28-6/11) A/ROM, isometrics, Week 8-10 early strengthening  WEIGHT BEARING RESTRICTIONS Yes NWB LUE  PAIN:  Are you  having pain? Yes 3/10 pain at the shoulder with activity "Stiff" mostly when laying on the shoulder    OBJECTIVE:    FUNCTIONAL OUTCOME MEASURES: FOTO: 50/100 01/13/22 FOTO: 63.76   UE ROM     Active ROM - seated. IR/er adducted Left 11/29/2021 Left 12/15/2021 Left  01/13/22  Shoulder flexion 102 113 120  Shoulder abduction 85 104 100  Shoulder internal rotation 85 85 90  Shoulder external rotation 55 42 75  (Blank rows = not tested)  Passive ROM - supine. IR/er adducted Left 11/29/2021 Left 12/15/2021 Left  01/13/22  Shoulder flexion 125 136 130  Shoulder abduction 91 84 105  Shoulder internal rotation 90 90 90  Shoulder external rotation 62 42 75  (Blank rows = not tested)   UE MMT:     MMT - seated. IR/er adducted Left 11/29/2021 Left 12/15/2021 Left  01/13/22  Shoulder flexion 3-/5 3/5 4+/5  Shoulder abduction 3-/5 3-/5 4/5  Shoulder internal rotation 3/5 3/5 5/5  Shoulder external rotation 3-/5 3/5 5/5  (Blank rows = not tested)     PATIENT EDUCATION: Education details: Scapular strengthening, neck and doorway stretches Person educated: patient Education method: explanation and demonstration, handout  Education comprehension: verbalized understanding, returned demonstration   HOME EXERCISE PROGRAM: Eval: table slides; 6/1 AA/ROM; 6/6: standing shoulder isometrics; 6/19: shoulder A/ROM; 6/30: Scapular strengthening, neck and pectoralis stretch  TREATMENT:   01/13/22 -Scapular Strengthening: Red Theraband, 1x15 retraction, extension, row -Neck stretch: Pt seated with palm face up, anchored under leg, 15"  stretch with neck in lateral flexion  -Door stretch: 3x15" stretch    01/04/22 -Manual therapy: completed to address muscle tightness and decrease pain prior to exercise. Posterior shoulder, upper trapezius, full length of extensors and biceps -P/ROM: Pt in supine, shoulder flexion and abduction x 10 reps  -Shoulder stretching: standing at wall for  shoulder flexion and abduction 15 second hold x3 reps each direction. Doorframe stretch with shoulder abducted to 45 degrees, minimal resistance -Scapular TheraBand: green, row extension, abducted retraction and row, 1x10 reps each direction  -Functional Reach: Shoulder flexion to remove and place 15 items on top shelf, above head height   01/02/22 -Manual therapy: completed prior to exercise, upper trapezius region, proximal extensors, biceps -P/ROM: seated, shoulder flexion, abduction, er/IR 5 reps each direction  -A/ROM: supine, shoulder flexion, abduction, er/IR 10 reps each direction  -Stretching: standing at wall for shoulder flexion and abduction 15 second hold x3 reps each direction  -Scapular Theraband: red row extension, abducted retraction and row, 1x15 reps each direction  -Overhead Lacing: began lacing at the top, went to bottom and reversed  -Functional Reach: shoulder flexion to place 16 items above head height and shoulder abduction to retrieve items     GOALS:   SHORT TERM GOALS: Target date: 12/20/2021   Patient will be educated and independent with HEP in order to facilitate her progress in therapy  and allow her to return to using her left arm as her dominant extremity for all daily tasks.  Baseline: Goal status: MET  2.  Patient will increase her LUE P/ROM to [facility] in order to increase the ability to complete dressing tasks with less difficulty.  Baseline:  Goal status: Not Met  3.  Patient will increase her LUE strength to 3+/5 in order to increase her ability to complete reaching tasks at or above shoulder level with less difficulty.  Baseline:  Goal status: MET  4.  Patient will decrease her LUE fascial restrictions to min amount or less in order to increase the functional mobility needed to complete reaching tasks.  Baseline:  Goal status: Not Met    LONG TERM GOALS: Target date: 01/10/2022    Patient will increase her LUE A/ROM to [facility] in order to complete  high level reaching tasks with less difficulty. Baseline:  Goal status: Not met  2.  Patient will increase her LUE strength to 4+/5 or better in order to return to using her LUE as her dominant extremity while managing moderate weighted items. Baseline:  Goal status: Not Met  3.  Patient will report a pain level of 2/10 or less in her LUE while utilizing it to complete basic ADL tasks.  Baseline:  Goal status: Not met    ASSESSMENT:  CLINICAL IMPRESSION: A: Pt presents today with decreased reported pain and excited about her progress. She reports benefiting greatly from her sister in law "working the knots out" daily. Measurements were taken today with most improvements noted with shoulder strength . Reported pain has also decreased. She continues to have shoulder AROM limitations and mild fascial restrictions. Pt completed HEP additions with minimal tactile cues for form.     PLAN: OT FREQUENCY: 2x/week  OT DURATION: 6 weeks  PLANNED INTERVENTIONS: self care/ADL training, therapeutic exercise, therapeutic activity, neuromuscular re-education, passive range of motion, electrical stimulation, ultrasound, moist heat, cryotherapy, patient/family education, and DME and/or AE instructions    CONSULTED AND AGREED WITH PLAN OF CARE: Patient  PLAN FOR NEXT SESSION: P: Discharge    OCCUPATIONAL THERAPY DISCHARGE SUMMARY  Visits from Start of Care: 10  Current functional level related to goals / functional outcomes: Pt has met 2 short term goals and partially met 2 STGs. She has partially met 3 LTGs. She reports that she is able to complete daily tasks at shoulder height and below with her left. She is now able to dress herself, complete grooming and bathing tasks and put her seat belt on. She reports some remaining difficulty with lifting overhead.    Remaining deficits: Pt continues with fascial restrictions, decreased ROM and decreased strength limiting ability to complete ADLs  that require overhead reaching and lifting. Pt continues with some increased pain that impacts her ability to sleep through the night without waking.   Education / Equipment: Eval: table slides; 6/1 AA/ROM; 6/6: standing shoulder isometrics; 6/19: shoulder A/ROM; 6/30: Theraband Scapular strengthening, neck and pectoralis stretch  Plan: Patient agrees to discharge.  Patient goals were partially met and pt with be transitioning to PT to receive dry needling to address remaining fascial restrictions and pain        Flonnie Hailstone, OTD, OTR/L [phone]  01/13/2022, 9:39 AM

## 2022-01-20 ENCOUNTER — Inpatient Hospital Stay: Payer: 59 | Attending: Oncology

## 2022-01-20 ENCOUNTER — Inpatient Hospital Stay (HOSPITAL_BASED_OUTPATIENT_CLINIC_OR_DEPARTMENT_OTHER): Payer: 59 | Admitting: Oncology

## 2022-01-20 ENCOUNTER — Inpatient Hospital Stay: Payer: 59

## 2022-01-20 ENCOUNTER — Other Ambulatory Visit: Payer: Self-pay

## 2022-01-20 VITALS — BP 122/84 | HR 72 | Temp 98.2°F | Resp 18 | Ht 67.0 in | Wt 173.2 lb

## 2022-01-20 DIAGNOSIS — C641 Malignant neoplasm of right kidney, except renal pelvis: Secondary | ICD-10-CM

## 2022-01-20 DIAGNOSIS — C7951 Secondary malignant neoplasm of bone: Secondary | ICD-10-CM | POA: Diagnosis not present

## 2022-01-20 DIAGNOSIS — Z5112 Encounter for antineoplastic immunotherapy: Secondary | ICD-10-CM | POA: Diagnosis not present

## 2022-01-20 DIAGNOSIS — Z79899 Other long term (current) drug therapy: Secondary | ICD-10-CM | POA: Insufficient documentation

## 2022-01-20 LAB — CBC WITH DIFFERENTIAL (CANCER CENTER ONLY)
Abs Immature Granulocytes: 0.01 10*3/uL (ref 0.00–0.07)
Basophils Absolute: 0.1 10*3/uL (ref 0.0–0.1)
Basophils Relative: 1 %
Eosinophils Absolute: 0.2 10*3/uL (ref 0.0–0.5)
Eosinophils Relative: 4 %
HCT: 39.3 % (ref 36.0–46.0)
Hemoglobin: 12.9 g/dL (ref 12.0–15.0)
Immature Granulocytes: 0 %
Lymphocytes Relative: 28 %
Lymphs Abs: 1.8 10*3/uL (ref 0.7–4.0)
MCH: 28.7 pg (ref 26.0–34.0)
MCHC: 32.8 g/dL (ref 30.0–36.0)
MCV: 87.5 fL (ref 80.0–100.0)
Monocytes Absolute: 0.4 10*3/uL (ref 0.1–1.0)
Monocytes Relative: 6 %
Neutro Abs: 3.9 10*3/uL (ref 1.7–7.7)
Neutrophils Relative %: 61 %
Platelet Count: 237 10*3/uL (ref 150–400)
RBC: 4.49 MIL/uL (ref 3.87–5.11)
RDW: 13.3 % (ref 11.5–15.5)
WBC Count: 6.4 10*3/uL (ref 4.0–10.5)
nRBC: 0 % (ref 0.0–0.2)

## 2022-01-20 LAB — CMP (CANCER CENTER ONLY)
ALT: 16 U/L (ref 0–44)
AST: 38 U/L (ref 15–41)
Albumin: 4.3 g/dL (ref 3.5–5.0)
Alkaline Phosphatase: 60 U/L (ref 38–126)
Anion gap: 5 (ref 5–15)
BUN: 16 mg/dL (ref 6–20)
CO2: 30 mmol/L (ref 22–32)
Calcium: 9.8 mg/dL (ref 8.9–10.3)
Chloride: 106 mmol/L (ref 98–111)
Creatinine: 1.33 mg/dL — ABNORMAL HIGH (ref 0.44–1.00)
GFR, Estimated: 46 mL/min — ABNORMAL LOW (ref >60)
Glucose, Bld: 100 mg/dL — ABNORMAL HIGH (ref 70–99)
Potassium: 4.7 mmol/L (ref 3.5–5.1)
Sodium: 141 mmol/L (ref 135–145)
Total Bilirubin: 0.6 mg/dL (ref 0.3–1.2)
Total Protein: 7.4 g/dL (ref 6.5–8.1)

## 2022-01-20 MED ORDER — SODIUM CHLORIDE 0.9 % IV SOLN
400.0000 mg | Freq: Once | INTRAVENOUS | Status: AC
  Administered 2022-01-20: 400 mg via INTRAVENOUS
  Filled 2022-01-20: qty 16

## 2022-01-20 MED ORDER — SODIUM CHLORIDE 0.9 % IV SOLN: Freq: Once | INTRAVENOUS | Status: AC

## 2022-01-20 NOTE — Progress Notes (Signed)
Hematology and Oncology Follow Up Visit  Dana Hanson [id number] 06-18-1961 61 y.o. 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   She has no active disease after local treatment for her isolated area of metastasis.   Prior Therapy:  She is status post laparoscopic right radical nephrectomy performed in February 2017.  She was found to have 6.5 cm clear-cell renal cell carcinoma.  She is status post surgical biopsy completed by Dr. Christella Noa on September 09, 2021 of the T6 spinal lesion which confirmed the presence of metastatic renal cell carcinoma.  She is status post spinal stereotactic radiosurgery completed on September 27, 2021 for isolated metastatic lesion of the T6 spine.   Current therapy: Pembrolizumab 200 mg every 3 weeks cycle 1 started on Nov 18, 2021.  She is here for cycle 4 of therapy.  Interim History: [name] is here for a follow-up visit.  Since last visit,     Medications: Updated on review. Current Outpatient Medications  Medication Sig Dispense Refill   acetaminophen (TYLENOL) 325 MG tablet Take 2 tablets (650 mg total) by mouth every 6 (six) hours as needed for moderate pain. 30 tablet 0   HYDROcodone-acetaminophen (NORCO/VICODIN) 5-325 MG tablet Take 1 tablet by mouth every 6 (six) hours as needed for moderate pain. 30 tablet 0   Multiple Vitamin (MULITIVITAMIN WITH MINERALS) TABS Take 1 tablet by mouth daily.     polyethylene glycol-electrolytes (TRILYTE) 420 g solution Take 4,000 mLs by mouth as directed. (Patient not taking: Reported on 12/09/2021) 4000 mL 0   pregabalin (LYRICA) 50 MG capsule Take 50 mg by mouth 3 (three) times daily.     No current facility-administered medications for this visit.     Allergies: No Known Allergies    Physical Exam:     ECOG: 1   General appearance:  Comfortable appearing without any discomfort Head: Normocephalic without any trauma Oropharynx: Mucous membranes are moist and pink without any thrush or ulcers. Eyes: Pupils are equal and round reactive to light. Lymph nodes: No cervical, supraclavicular, inguinal or axillary lymphadenopathy.   Heart:regular rate and rhythm.  S1 and S2 without leg edema. Lung: Clear without any rhonchi or wheezes.  No dullness to percussion. Abdomin: Soft, nontender, nondistended with good bowel sounds.  No hepatosplenomegaly. Musculoskeletal: No joint deformity or effusion.  Full range of motion noted. Neurological: No deficits noted on motor, sensory and deep tendon reflex exam. Skin: No petechial rash or dryness.  Appeared moist.          Lab Results: Lab Results  Component Value Date   WBC 6.1 12/29/2021   HGB 12.2 12/29/2021   HCT 37.0 12/29/2021   MCV 87.7 12/29/2021   PLT 311 12/29/2021     Chemistry      Component Value Date/Time   NA 142 12/29/2021 1118   K 4.1 12/29/2021 1118   CL 108 12/29/2021 1118   CO2 28 12/29/2021 1118   BUN 23 (H) 12/29/2021 1118   CREATININE 1.25 (H) 12/29/2021 1118      Component Value Date/Time   CALCIUM 9.7 12/29/2021 1118   ALKPHOS 66 12/29/2021 1118   AST 32 12/29/2021 1118   ALT 40 12/29/2021 1118   BILITOT 0.4 12/29/2021 1118          Impression and Plan:  62 year old with:  1.  Stage IV  clear-cell renal cell carcinoma with isolated spinal metastasis diagnosed in January 2023.  The natural course of her disease was reviewed at this time and treatment choices were discussed.  She is currently on adjuvant Pembrolizumab and tolerated it very well.  Complication associated with this treatment that include GI toxicity, dermatological issues as well as autoimmune consideration were reviewed.    2.  Thoracic spine metastasis: MRI of the spine obtained on December 29, 2021 showed excellent results without any reactivation noted.  3.   Autoimmune complications: I continue to educate her about potential complications including pneumonitis, colitis and thyroid disease.   4.  Follow-up: In 3 weeks for repeat follow-up.  30  minutes were dedicated to this visit.  The time was spent on reviewing laboratory data, disease status update and outlining future plan of care discussion.   Zola Button, MD 7/7/202312:48 PM

## 2022-01-20 NOTE — Patient Instructions (Signed)
[facility] MEDICAL ONCOLOGY  Discharge Instructions: Thank you for choosing [facility] to provide your oncology and hematology care.   If you have a lab appointment with the Cancer Center, please go directly to the Cancer Center and check in at the registration area.   Wear comfortable clothing and clothing appropriate for easy access to any Portacath or PICC line.   We strive to give you quality time with your provider. You may need to reschedule your appointment if you arrive late (15 or more minutes).  Arriving late affects you and other patients whose appointments are after yours.  Also, if you miss three or more appointments without notifying the office, you may be dismissed from the clinic at the provider's discretion.      For prescription refill requests, have your pharmacy contact our office and allow 72 hours for refills to be completed.    Today you received the following chemotherapy and/or immunotherapy agents: keytruda      To help prevent nausea and vomiting after your treatment, we encourage you to take your nausea medication as directed.  BELOW ARE SYMPTOMS THAT SHOULD BE REPORTED IMMEDIATELY: *FEVER GREATER THAN 100.4 F (38 C) OR HIGHER *CHILLS OR SWEATING *NAUSEA AND VOMITING THAT IS NOT CONTROLLED WITH YOUR NAUSEA MEDICATION *UNUSUAL SHORTNESS OF BREATH *UNUSUAL BRUISING OR BLEEDING *URINARY PROBLEMS (pain or burning when urinating, or frequent urination) *BOWEL PROBLEMS (unusual diarrhea, constipation, pain near the anus) TENDERNESS IN MOUTH AND THROAT WITH OR WITHOUT PRESENCE OF ULCERS (sore throat, sores in mouth, or a toothache) UNUSUAL RASH, SWELLING OR PAIN  UNUSUAL VAGINAL DISCHARGE OR ITCHING   Items with * indicate a potential emergency and should be followed up as soon as possible or go to the Emergency Department if any problems should occur.  Please show the CHEMOTHERAPY ALERT CARD or IMMUNOTHERAPY ALERT CARD at check-in to  the Emergency Department and triage nurse.  Should you have questions after your visit or need to cancel or reschedule your appointment, please contact [facility] MEDICAL ONCOL[facility]  Dept: [phone]  and follow the prompts.  Office hours are 8:00 a.m. to 4:30 p.m. Monday - Friday. Please note that voicemails left after 4:00 p.m. may not be returned until the following business day.  We are closed weekends and major holidays. You have access to a nurse at all times for urgent questions. Please call the main number to the clinic Dept: [phone] and follow the prompts.   For any non-urgent questions, you may also contact your provider using MyChart. We now offer e-Visits for anyone 18 and older to request care online for non-urgent symptoms. For details visit [email or web address].   Also download the MyChart app! Go to the app store, search "MyChart", open the app, select [facility], and log in with your MyChart username and password.  Masks are optional in the cancer centers. If you would like for your care team to wear a mask while they are taking care of you, please let them know. For doctor visits, patients may have with them one support person who is at least 60 years old. At this time, visitors are not allowed in the infusion area. 

## 2022-01-21 LAB — TSH: TSH: 59.961 u[IU]/mL — ABNORMAL HIGH (ref 0.350–4.500)

## 2022-01-23 ENCOUNTER — Other Ambulatory Visit: Payer: Self-pay | Admitting: Oncology

## 2022-01-23 MED ORDER — LEVOTHYROXINE SODIUM 25 MCG PO TABS: 25.0000 ug | ORAL_TABLET | Freq: Every day | ORAL | 3 refills | Status: DC

## 2022-01-25 ENCOUNTER — Ambulatory Visit (HOSPITAL_COMMUNITY): Payer: 59 | Attending: Orthopedic Surgery | Admitting: Physical Therapy

## 2022-01-25 DIAGNOSIS — S42202D Unspecified fracture of upper end of left humerus, subsequent encounter for fracture with routine healing: Secondary | ICD-10-CM | POA: Insufficient documentation

## 2022-01-26 ENCOUNTER — Encounter: Payer: Self-pay | Admitting: Orthopedic Surgery

## 2022-01-26 ENCOUNTER — Ambulatory Visit (INDEPENDENT_AMBULATORY_CARE_PROVIDER_SITE_OTHER): Payer: 59 | Admitting: Orthopedic Surgery

## 2022-01-26 DIAGNOSIS — S42295D Other nondisplaced fracture of upper end of left humerus, subsequent encounter for fracture with routine healing: Secondary | ICD-10-CM

## 2022-01-26 NOTE — Progress Notes (Signed)
Chief Complaint  Patient presents with   Shoulder Injury    10/30/21 left shoulder     Reck rom left shoulder   Shes doing very well  Her range of motion is 130 degrees of flexion 90 degrees abduction  I released her to follow-up as needed  Encounter Diagnosis  Name Primary?   Other closed nondisplaced fracture of proximal end of left humerus with routine healing, subsequent encounter 10/30/21 Yes

## 2022-01-31 ENCOUNTER — Encounter (HOSPITAL_COMMUNITY): Payer: 59 | Admitting: Physical Therapy

## 2022-02-01 ENCOUNTER — Ambulatory Visit (HOSPITAL_COMMUNITY): Payer: 59 | Attending: Orthopedic Surgery | Admitting: Physical Therapy

## 2022-02-01 DIAGNOSIS — M25512 Pain in left shoulder: Secondary | ICD-10-CM | POA: Insufficient documentation

## 2022-02-01 DIAGNOSIS — R29898 Other symptoms and signs involving the musculoskeletal system: Secondary | ICD-10-CM | POA: Diagnosis present

## 2022-02-01 DIAGNOSIS — M25612 Stiffness of left shoulder, not elsewhere classified: Secondary | ICD-10-CM | POA: Diagnosis not present

## 2022-02-01 NOTE — Patient Instructions (Signed)

## 2022-02-01 NOTE — Therapy (Signed)
OUTPATIENT PHYSICAL THERAPY SHOULDER EVALUATION   Patient Name: Dana Hanson MRN: [id number] DOB:28-Nov-1960, 61 y.o., female Today's Date: 02/01/2022   PT End of Session - 02/01/22 1109     Visit Number 1    Number of Visits 8    Date for PT Re-Evaluation 03/01/22    Authorization Type Generic [employer]    PT Start Time 1030    PT Stop Time 1110    PT Time Calculation (min) 40 min    Activity Tolerance Patient tolerated treatment well    Behavior During Therapy WFL for tasks assessed/performed             Past Medical History:  Diagnosis Date   Cancer ([place])    kidney   Gallstone pancreatitis    Hyperlipidemia    IBS (irritable bowel syndrome)    patient denies   Right renal mass    Past Surgical History:  Procedure Laterality Date   CESAREAN SECTION     x 2-tubal with last c-section   CHOLECYSTECTOMY N/A 09/03/2015   Procedure: LAPAROSCOPIC CHOLECYSTECTOMY WITH INTRAOPERATIVE CHOLANGIOGRAM;  Surgeon: Mickeal Skinner, MD;  Location: [facility];  Service: General;  Laterality: N/A;   COLONOSCOPY  08/14/2011   Surgeon: Daneil Dolin, MD; external hemorrhoid tags, likely source of hematochezia, normal rectum, multiple colonic polyps at the base of the cecum, all less than 5 mm, single 4 mm sigmoid polyp, scattered diverticula.  Pathology revealed tubular adenoma in the cecum, hyperplastic polyp in the sigmoid colon.  Recommended repeat colonoscopy in 3 years.   COLONOSCOPY WITH PROPOFOL N/A 11/30/2021   Procedure: COLONOSCOPY WITH PROPOFOL;  Surgeon: Daneil Dolin, MD;  [facility];  Service: Endoscopy;  Laterality: N/A;  1:45pm   HEMORRHOID SURGERY  11/10/2011   Procedure: HEMORRHOIDECTOMY;  Surgeon: Donato Heinz, MD;  Location: [facility];  Service: General;  Laterality: N/A;   [place] RESECTION  01/15/2015   LAPAROSCOPIC NEPHRECTOMY N/A 09/03/2015   Procedure: LAPAROSCOPIC RADICAL NEPHRECTOMY;  Surgeon: Ardis Hughs, MD;   Location: [facility];  Service: Urology;  Laterality: N/A;   POLYPECTOMY  11/30/2021   Procedure: POLYPECTOMY;  Surgeon: Daneil Dolin, MD;  [facility];  Service: Endoscopy;;   TUBAL LIGATION     with last c-section   VERTEBROPLASTY N/A 09/09/2021   Procedure: Thoracic vertebral biopsy;  Surgeon: Ashok Pall, MD;  Location: [place];  Service: Neurosurgery;  Laterality: N/A;  RM 21   Patient Active Problem List   Diagnosis Date Noted   Closed fracture of proximal end of left humerus with routine healing 01/25/2022   History of colonic polyps 10/12/2021   Abnormal PET scan of colon 10/12/2021   Metastatic renal cell carcinoma to bone ([place]) 09/27/2021   Cancer of right kidney ([place]) 09/03/2015   Renal mass 09/03/2015   Pancreatitis 08/21/2015   Biliary obstruction    Primary osteoarthritis of right knee 03/17/2014   Hemorrhoids 08/01/2011   Heme positive stool 08/01/2011   Rectal bleed 08/01/2011    PCP: [name] MD  REFERRING PROVIDER: Carole Civil, MD   REFERRING DIAG: PT eval/tx for proximal humerus   THERAPY DIAG:  Stiffness of left shoulder joint - Plan: PT plan of care cert/re-cert  Pain in joint of left shoulder - Plan: PT plan of care cert/re-cert  Rationale for Evaluation and Treatment Rehabilitation  ONSET DATE: 10/23/21  SUBJECTIVE:  SUBJECTIVE STATEMENT: Patient presents to therapy with complaint of LT shoulder pain. She states she fell on Easter Sunday and broke her arm. She was placed in sling for about 4-6 weeks. She had OT for several weeks and regained much of her motion. She is still limited by neck and shoulder pain possibly related to muscle tightness. She would like to try physical therapy for dry needling to address this. She has since been released from ortho MD    PERTINENT HISTORY: LT humrous fx 10/23/21  PAIN:  Are you having pain? No  PRECAUTIONS: None  WEIGHT BEARING RESTRICTIONS No  FALLS:  Has patient fallen in last 6 months? Yes. Number of falls 1  LIVING ENVIRONMENT: Lives with: lives with their family and lives with their son Lives in: House/apartment   OCCUPATION: HR manager   PLOF: Independent  PATIENT GOALS "Be able to have total movement in LT arm"  OBJECTIVE:   DIAGNOSTIC FINDINGS:  NA  PATIENT SURVEYS:  FOTO 53% function  COGNITION:  Overall cognitive status: Within functional limits for tasks assessed     SENSATION: WFL  POSTURE: Forward shoulder   UPPER EXTREMITY ROM:   Active ROM Right eval Left eval  Shoulder flexion 130 115  Shoulder extension    Shoulder abduction 165 115  Shoulder adduction    Shoulder internal rotation T7 Ilium  Shoulder external rotation T3 T1  Elbow flexion    Elbow extension    Wrist flexion    Wrist extension    Wrist ulnar deviation    Wrist radial deviation    Wrist pronation    Wrist supination    (Blank rows = not tested)  UPPER EXTREMITY MMT:  MMT Right eval Left eval  Shoulder flexion 5 5  Shoulder extension    Shoulder abduction 5 4+  Shoulder adduction    Shoulder internal rotation 5 5  Shoulder external rotation 5 4+  Middle trapezius    Lower trapezius    Elbow flexion    Elbow extension    Wrist flexion    Wrist extension    Wrist ulnar deviation    Wrist radial deviation    Wrist pronation    Wrist supination    Grip strength (lbs)    (Blank rows = not tested)   PALPATION:  Min/ Mod TTP about LT upper trap, levator, scapular border    TODAY'S TREATMENT:  02/01/22 Eval    PATIENT EDUCATION: Education details: on Eval findings, POC and HEP  Person educated: Patient Education method: Explanation Education comprehension: verbalized understanding   HOME EXERCISE PROGRAM: HEP review, progress next  visit  ASSESSMENT:  CLINICAL IMPRESSION: Patient is a 61 y.o. female who presents to physical therapy with complaint of LT shoulder pain. Patient demonstrates decreased strength, ROM restriction, reduced flexibility, increased tenderness to palpation and postural abnormalities which are likely contributing to symptoms of pain and are negatively impacting patient ability to perform ADLs. Patient will benefit from skilled physical therapy services to address these deficits to reduce pain and improve level of function with ADLs    OBJECTIVE IMPAIRMENTS decreased activity tolerance, decreased mobility, decreased ROM, decreased strength, increased fascial restrictions, impaired flexibility, impaired UE functional use, improper body mechanics, postural dysfunction, and pain.   ACTIVITY LIMITATIONS carrying, lifting, sleeping, and reach over head  PARTICIPATION LIMITATIONS: meal prep, cleaning, laundry, driving, shopping, community activity, occupation, and yard work  PERSONAL FACTORS  None  are also affecting patient's functional outcome.   REHAB POTENTIAL: Good  CLINICAL DECISION MAKING: Stable/uncomplicated  EVALUATION COMPLEXITY: Low   GOALS: SHORT TERM GOALS: Target date: 02/15/2022  Patient will be independent with initial HEP and self-management strategies to improve functional outcomes Baseline:  Goal status: INITIAL   LONG TERM GOALS: Target date: 03/01/2022  Patient will be independent with advanced HEP and self-management strategies to improve functional outcomes Baseline:  Goal status: INITIAL  2.  Patient will improve FOTO score to predicted value to indicate improvement in functional outcomes Baseline: 53% function  Goal status: INITIAL  3.  Patient will demo improved LT shoulder flexion and abduction to within 5 degrees of contralateral side in order to improve ability to perform [place] ADLs such as dressing, grooming and cleaning.  Baseline: See AROM  Goal status:  INITIAL  4. Patient will have improved functional LT shoulder IR to at least L1 level for improved ability to don pants/ belt with ease and less pain Baseline: See AROM  Goal status: INITIAL  PLAN: PT FREQUENCY: 1-2x/week  PT DURATION: 4 weeks  PLANNED INTERVENTIONS: Therapeutic exercises, Therapeutic activity, Neuromuscular re-education, Balance training, Gait training, Patient/Family education, Joint manipulation, Joint mobilization, Stair training, Aquatic Therapy, Dry Needling, Electrical stimulation, Spinal manipulation, Spinal mobilization, Cryotherapy, Moist heat, scar mobilization, Taping, Traction, Ultrasound, Biofeedback, Ionotophoresis '4mg'$ /ml Dexamethasone, and Manual therapy.   PLAN FOR NEXT SESSION: Progress shoulder stretch and scapular strengthening. GHJ AROM progressing as tolerated. Manual and DN as indicated   11:11 AM, 02/01/22 Josue Hector PT DPT  Physical Therapist with [facility]  [phone]

## 2022-02-02 ENCOUNTER — Ambulatory Visit (HOSPITAL_COMMUNITY): Payer: 59 | Admitting: Physical Therapy

## 2022-02-02 ENCOUNTER — Encounter (HOSPITAL_COMMUNITY): Payer: Self-pay | Admitting: Physical Therapy

## 2022-02-02 DIAGNOSIS — M25612 Stiffness of left shoulder, not elsewhere classified: Secondary | ICD-10-CM

## 2022-02-02 DIAGNOSIS — M25512 Pain in left shoulder: Secondary | ICD-10-CM

## 2022-02-02 NOTE — Therapy (Signed)
OUTPATIENT PHYSICAL THERAPY TREATMENT NOTE   Patient Name: Dana Hanson MRN: [id number] DOB:Nov 23, 1960, 61 y.o., female Today's Date: 02/02/2022  PCP: [name] MD REFERRING PROVIDER: Carole Civil, MD   END OF SESSION:   PT End of Session - 02/02/22 0950     Visit Number 2    Number of Visits 8    Date for PT Re-Evaluation 03/01/22    Authorization Type Generic [employer]    PT Start Time [phone]    PT Stop Time 1028    PT Time Calculation (min) 40 min    Activity Tolerance Patient tolerated treatment well    Behavior During Therapy [facility] for tasks assessed/performed             Past Medical History:  Diagnosis Date   Cancer ([place])    kidney   Gallstone pancreatitis    Hyperlipidemia    IBS (irritable bowel syndrome)    patient denies   Right renal mass    Past Surgical History:  Procedure Laterality Date   CESAREAN SECTION     x 2-tubal with last c-section   CHOLECYSTECTOMY N/A 09/03/2015   Procedure: LAPAROSCOPIC CHOLECYSTECTOMY WITH INTRAOPERATIVE CHOLANGIOGRAM;  Surgeon: Mickeal Skinner, MD;  [facility];  Service: General;  Laterality: N/A;   COLONOSCOPY  08/14/2011   Surgeon: Daneil Dolin, MD; external hemorrhoid tags, likely source of hematochezia, normal rectum, multiple colonic polyps at the base of the cecum, all less than 5 mm, single 4 mm sigmoid polyp, scattered diverticula.  Pathology revealed tubular adenoma in the cecum, hyperplastic polyp in the sigmoid colon.  Recommended repeat colonoscopy in 3 years.   COLONOSCOPY WITH PROPOFOL N/A 11/30/2021   Procedure: COLONOSCOPY WITH PROPOFOL;  Surgeon: Daneil Dolin, MD;  [facility];  Service: Endoscopy;  Laterality: N/A;  1:45pm   HEMORRHOID SURGERY  11/10/2011   Procedure: HEMORRHOIDECTOMY;  Surgeon: Donato Heinz, MD;  [facility];  Service: General;  Laterality: N/A;   [place] RESECTION  01/15/2015   LAPAROSCOPIC NEPHRECTOMY N/A 09/03/2015    Procedure: LAPAROSCOPIC RADICAL NEPHRECTOMY;  Surgeon: Ardis Hughs, MD;  [facility];  Service: Urology;  Laterality: N/A;   POLYPECTOMY  11/30/2021   Procedure: POLYPECTOMY;  Surgeon: Daneil Dolin, MD;  [facility];  Service: Endoscopy;;   TUBAL LIGATION     with last c-section   VERTEBROPLASTY N/A 09/09/2021   Procedure: Thoracic vertebral biopsy;  Surgeon: Ashok Pall, MD;  Location: [place];  Service: Neurosurgery;  Laterality: N/A;  RM 21   Patient Active Problem List   Diagnosis Date Noted   Closed fracture of proximal end of left humerus with routine healing 01/25/2022   History of colonic polyps 10/12/2021   Abnormal PET scan of colon 10/12/2021   Metastatic renal cell carcinoma to bone ([place]) 09/27/2021   Cancer of right kidney ([place]) 09/03/2015   Renal mass 09/03/2015   Pancreatitis 08/21/2015   Biliary obstruction    Primary osteoarthritis of right knee 03/17/2014   Hemorrhoids 08/01/2011   Heme positive stool 08/01/2011   Rectal bleed 08/01/2011    REFERRING DIAG: PT eval/tx for proximal humerus   THERAPY DIAG:  Stiffness of left shoulder joint  Pain in joint of left shoulder  Rationale for Evaluation and Treatment Rehabilitation  PERTINENT HISTORY: LT humrous fx 10/23/21   PRECAUTIONS: none  SUBJECTIVE: Patient states she is doing well overall. Still ongoing stiffness in shoulder. She was not able to  do thoracic extension stretching due to no good chair at home.   PAIN:  Are you having pain? No  OBJECTIVE:    DIAGNOSTIC FINDINGS:  NA   PATIENT SURVEYS:  FOTO 53% function   COGNITION:           Overall cognitive status: Within functional limits for tasks assessed                                  SENSATION: WFL   POSTURE: Forward shoulder    UPPER EXTREMITY ROM:    Active ROM Right eval Left eval  Shoulder flexion 130 115  Shoulder extension      Shoulder abduction 165 115  Shoulder adduction      Shoulder internal  rotation T7 Ilium  Shoulder external rotation T3 T1  Elbow flexion      Elbow extension      Wrist flexion      Wrist extension      Wrist ulnar deviation      Wrist radial deviation      Wrist pronation      Wrist supination      (Blank rows = not tested)   UPPER EXTREMITY MMT:   MMT Right eval Left eval  Shoulder flexion 5 5  Shoulder extension      Shoulder abduction 5 4+  Shoulder adduction      Shoulder internal rotation 5 5  Shoulder external rotation 5 4+  Middle trapezius      Lower trapezius      Elbow flexion      Elbow extension      Wrist flexion      Wrist extension      Wrist ulnar deviation      Wrist radial deviation      Wrist pronation      Wrist supination      Grip strength (lbs)      (Blank rows = not tested)     PALPATION:  Min/ Mod TTP about LT upper trap, levator, scapular border              TODAY'S TREATMENT:  02/02/22  LT GHJ inf/ post glides grade II-III LT GHJ PROM flexion/ abduction/ IR   Trigger Point Dry-Needling  Treatment instructions: Expect mild to moderate muscle soreness. S/S of pneumothorax if dry needled over a lung field, and to seek immediate medical attention should they occur. Patient verbalized understanding of these instructions and education.  Patient Consent Given: Yes Education handout provided: Yes Muscles treated: LT upper trap  Electrical stimulation performed: No Parameters: N/A Treatment response/outcome: good tolerance   Manual STM to LT upper trap, levator pre and post dry needling for trigger point identification and surface area preparation     Shoulder adduction stretch 3 x20" Doorway stretch 3 x 20"  Supine thoracic extension with shoulder flexion (towel roll at bra line) x10    02/01/22 Eval      PATIENT EDUCATION: Education details: on Eval findings, POC and HEP  Person educated: Patient Education method: Explanation Education comprehension: verbalized understanding     HOME  EXERCISE PROGRAM: Access Code: [email or web address] URL: [email or web address] Date: 02/02/2022 Prepared by: Josue Hector  Exercises - Thoracic Extension Mobilization with Noodle  - 2-3 x daily - 7 x weekly - 2 sets - 10 reps - Standing Shoulder Posterior Capsule Stretch  - 2-3 x daily - 7 x weekly -  1 sets - 4 reps - 20-30 second hold - Single Arm Doorway Pec Stretch at 90 Degrees Abduction  - 2-3 x daily - 7 x weekly - 1 sets - 4 reps - 20-30 second hold   ASSESSMENT:   CLINICAL IMPRESSION: Performed trigger point DN today with good result. Good tolerance. Progressed thoracic mobility with supine thoracic self mobs. Manual to address GHJ capsule restriction. Good results, noting improved shoulder IR following. Updated HEP and issued handout. Patient will continue to benefit from skilled therapy services to reduce remaining deficits and improve functional ability.       OBJECTIVE IMPAIRMENTS decreased activity tolerance, decreased mobility, decreased ROM, decreased strength, increased fascial restrictions, impaired flexibility, impaired UE functional use, improper body mechanics, postural dysfunction, and pain.    ACTIVITY LIMITATIONS carrying, lifting, sleeping, and reach over head   PARTICIPATION LIMITATIONS: meal prep, cleaning, laundry, driving, shopping, community activity, occupation, and yard work   PERSONAL FACTORS  None  are also affecting patient's functional outcome.    REHAB POTENTIAL: Good   CLINICAL DECISION MAKING: Stable/uncomplicated   EVALUATION COMPLEXITY: Low     GOALS: SHORT TERM GOALS: Target date: 02/15/2022   Patient will be independent with initial HEP and self-management strategies to improve functional outcomes Baseline:  Goal status: INITIAL    LONG TERM GOALS: Target date: 03/01/2022   Patient will be independent with advanced HEP and self-management strategies to improve functional outcomes Baseline:  Goal status: INITIAL   2.  Patient  will improve FOTO score to predicted value to indicate improvement in functional outcomes Baseline: 53% function  Goal status: INITIAL   3.  Patient will demo improved LT shoulder flexion and abduction to within 5 degrees of contralateral side in order to improve ability to perform [place] ADLs such as dressing, grooming and cleaning.  Baseline: See AROM  Goal status: INITIAL   4. Patient will have improved functional LT shoulder IR to at least L1 level for improved ability to don pants/ belt with ease and less pain Baseline: See AROM  Goal status: INITIAL   PLAN: PT FREQUENCY: 1-2x/week   PT DURATION: 4 weeks   PLANNED INTERVENTIONS: Therapeutic exercises, Therapeutic activity, Neuromuscular re-education, Balance training, Gait training, Patient/Family education, Joint manipulation, Joint mobilization, Stair training, Aquatic Therapy, Dry Needling, Electrical stimulation, Spinal manipulation, Spinal mobilization, Cryotherapy, Moist heat, scar mobilization, Taping, Traction, Ultrasound, Biofeedback, Ionotophoresis '4mg'$ /ml Dexamethasone, and Manual therapy.     PLAN FOR NEXT SESSION: Progress shoulder stretch and scapular strengthening. GHJ AROM progressing as tolerated. Manual and DN as indicated      9:50 AM, 02/02/22 Josue Hector PT DPT  Physical Therapist with [facility]  [phone]

## 2022-02-06 ENCOUNTER — Encounter: Payer: Self-pay | Admitting: Oncology

## 2022-02-06 ENCOUNTER — Other Ambulatory Visit: Payer: Self-pay

## 2022-02-06 ENCOUNTER — Encounter (HOSPITAL_COMMUNITY): Payer: Self-pay | Admitting: Physical Therapy

## 2022-02-06 ENCOUNTER — Ambulatory Visit (HOSPITAL_COMMUNITY): Payer: 59 | Admitting: Physical Therapy

## 2022-02-06 DIAGNOSIS — M25612 Stiffness of left shoulder, not elsewhere classified: Secondary | ICD-10-CM | POA: Diagnosis not present

## 2022-02-06 DIAGNOSIS — M25512 Pain in left shoulder: Secondary | ICD-10-CM

## 2022-02-06 NOTE — Therapy (Signed)
OUTPATIENT PHYSICAL THERAPY TREATMENT NOTE   Patient Name: Dana Hanson MRN: [id number] DOB:Oct 03, 1960, 61 y.o., female Today's Date: 02/06/2022  PCP: [name] MD REFERRING PROVIDER: Carole Civil, MD   END OF SESSION:   PT End of Session - 02/06/22 0953     Visit Number 3    Number of Visits 8    Date for PT Re-Evaluation 03/01/22    Authorization Type Generic [employer]    PT Start Time 0950    PT Stop Time 7262    PT Time Calculation (min) 38 min    Activity Tolerance Patient tolerated treatment well    Behavior During Therapy WFL for tasks assessed/performed             Past Medical History:  Diagnosis Date   Cancer ([place])    kidney   Gallstone pancreatitis    Hyperlipidemia    IBS (irritable bowel syndrome)    patient denies   Right renal mass    Past Surgical History:  Procedure Laterality Date   CESAREAN SECTION     x 2-tubal with last c-section   CHOLECYSTECTOMY N/A 09/03/2015   Procedure: LAPAROSCOPIC CHOLECYSTECTOMY WITH INTRAOPERATIVE CHOLANGIOGRAM;  Surgeon: Mickeal Skinner, MD;  Location: [facility];  Service: General;  Laterality: N/A;   COLONOSCOPY  08/14/2011   Surgeon: Daneil Dolin, MD; external hemorrhoid tags, likely source of hematochezia, normal rectum, multiple colonic polyps at the base of the cecum, all less than 5 mm, single 4 mm sigmoid polyp, scattered diverticula.  Pathology revealed tubular adenoma in the cecum, hyperplastic polyp in the sigmoid colon.  Recommended repeat colonoscopy in 3 years.   COLONOSCOPY WITH PROPOFOL N/A 11/30/2021   Procedure: COLONOSCOPY WITH PROPOFOL;  Surgeon: Daneil Dolin, MD;  [facility]: [facility];  Service: Endoscopy;  Laterality: N/A;  1:45pm   HEMORRHOID SURGERY  11/10/2011   Procedure: HEMORRHOIDECTOMY;  Surgeon: Donato Heinz, MD;  Location: [facility];  Service: General;  Laterality: N/A;   [place] RESECTION  01/15/2015   LAPAROSCOPIC NEPHRECTOMY N/A 09/03/2015    Procedure: LAPAROSCOPIC RADICAL NEPHRECTOMY;  Surgeon: Ardis Hughs, MD;  Location: [facility];  Service: Urology;  Laterality: N/A;   POLYPECTOMY  11/30/2021   Procedure: POLYPECTOMY;  Surgeon: Daneil Dolin, MD;  [facility];  Service: Endoscopy;;   TUBAL LIGATION     with last c-section   VERTEBROPLASTY N/A 09/09/2021   Procedure: Thoracic vertebral biopsy;  Surgeon: Ashok Pall, MD;  Location: [place];  Service: Neurosurgery;  Laterality: N/A;  RM 21   Patient Active Problem List   Diagnosis Date Noted   Closed fracture of proximal end of left humerus with routine healing 01/25/2022   History of colonic polyps 10/12/2021   Abnormal PET scan of colon 10/12/2021   Metastatic renal cell carcinoma to bone ([place]) 09/27/2021   Cancer of right kidney ([place]) 09/03/2015   Renal mass 09/03/2015   Pancreatitis 08/21/2015   Biliary obstruction    Primary osteoarthritis of right knee 03/17/2014   Hemorrhoids 08/01/2011   Heme positive stool 08/01/2011   Rectal bleed 08/01/2011    REFERRING DIAG: PT eval/tx for proximal humerus   THERAPY DIAG:  Stiffness of left shoulder joint  Pain in joint of left shoulder  Rationale for Evaluation and Treatment Rehabilitation  PERTINENT HISTORY: LT humrous fx 10/23/21   PRECAUTIONS: none  SUBJECTIVE: Patient reports she is doing well today. Felt DN was very helpful. Some soreness in bicep area  today. Feels better able to reach behind. Has been compliant with HEP.  PAIN:  Are you having pain? No  OBJECTIVE:    DIAGNOSTIC FINDINGS:  NA   PATIENT SURVEYS:  FOTO 53% function   COGNITION:           Overall cognitive status: Within functional limits for tasks assessed                                  SENSATION: WFL   POSTURE: Forward shoulder    UPPER EXTREMITY ROM:    Active ROM Right eval Left eval  Shoulder flexion 130 115  Shoulder extension      Shoulder abduction 165 115  Shoulder adduction      Shoulder  internal rotation T7 Ilium  Shoulder external rotation T3 T1  Elbow flexion      Elbow extension      Wrist flexion      Wrist extension      Wrist ulnar deviation      Wrist radial deviation      Wrist pronation      Wrist supination      (Blank rows = not tested)   UPPER EXTREMITY MMT:   MMT Right eval Left eval  Shoulder flexion 5 5  Shoulder extension      Shoulder abduction 5 4+  Shoulder adduction      Shoulder internal rotation 5 5  Shoulder external rotation 5 4+  Middle trapezius      Lower trapezius      Elbow flexion      Elbow extension      Wrist flexion      Wrist extension      Wrist ulnar deviation      Wrist radial deviation      Wrist pronation      Wrist supination      Grip strength (lbs)      (Blank rows = not tested)     PALPATION:  Min/ Mod TTP about LT upper trap, levator, scapular border              TODAY'S TREATMENT:  02/06/22 Shoulder adduction stretch 3 x 20" each Shoulder IR with strap 5 x 10"  Band row GTB 2 x 10 Shoulder extension GTB 2 x 10 Bilateral shoulder ER G TB 2 x 10 High row GTB 2 x 10 Lat pull down 3 plates 2 x 10 Standing pec stretch in door 3 x 30" (low, mid, high)   LT GHJ inf/ post glides grade II-III LT GHJ PROM flexion/ abduction Thoracic P/ A grade II-III    Trigger Point Dry-Needling  Treatment instructions: Expect mild to moderate muscle soreness. S/S of pneumothorax if dry needled over a lung field, and to seek immediate medical attention should they occur. Patient verbalized understanding of these instructions and education.  Patient Consent Given: Yes Education handout provided: Yes Muscles treated: LT upper trap  Electrical stimulation performed: No Parameters: N/A Treatment response/outcome: good tolerance   Manual STM to LT upper trap, levator pre and post dry needling for trigger point identification and surface area preparation    02/02/22  LT GHJ inf/ post glides grade II-III LT GHJ PROM  flexion/ abduction/ IR   Trigger Point Dry-Needling  Treatment instructions: Expect mild to moderate muscle soreness. S/S of pneumothorax if dry needled over a lung field, and to seek immediate medical attention should  they occur. Patient verbalized understanding of these instructions and education.  Patient Consent Given: Yes Education handout provided: Yes Muscles treated: LT upper trap  Electrical stimulation performed: No Parameters: N/A Treatment response/outcome: good tolerance   Manual STM to LT upper trap, levator pre and post dry needling for trigger point identification and surface area preparation     Shoulder adduction stretch 3 x20" Doorway stretch 3 x 20"  Supine thoracic extension with shoulder flexion (towel roll at bra line) x10    02/01/22 Eval      PATIENT EDUCATION: Education details: on Eval findings, POC and HEP  Person educated: Patient Education method: Explanation Education comprehension: verbalized understanding     HOME EXERCISE PROGRAM: Access Code: [email or web address] URL: [email or web address] Date: 02/02/2022 Prepared by: Josue Hector  Exercises - Thoracic Extension Mobilization with Noodle  - 2-3 x daily - 7 x weekly - 2 sets - 10 reps - Standing Shoulder Posterior Capsule Stretch  - 2-3 x daily - 7 x weekly - 1 sets - 4 reps - 20-30 second hold - Single Arm Doorway Pec Stretch at 90 Degrees Abduction  - 2-3 x daily - 7 x weekly - 1 sets - 4 reps - 20-30 second hold   ASSESSMENT:   CLINICAL IMPRESSION: Good tolerance with additional there ex. Patient noting increased muscle fatigue. Performed thoracic manual mobilization and GHJ PROM to address shoulder mobility restriction. Patient will continue to benefit from skilled therapy services to reduce remaining deficits and improve functional ability.    OBJECTIVE IMPAIRMENTS decreased activity tolerance, decreased mobility, decreased ROM, decreased strength, increased fascial  restrictions, impaired flexibility, impaired UE functional use, improper body mechanics, postural dysfunction, and pain.    ACTIVITY LIMITATIONS carrying, lifting, sleeping, and reach over head   PARTICIPATION LIMITATIONS: meal prep, cleaning, laundry, driving, shopping, community activity, occupation, and yard work   PERSONAL FACTORS  None  are also affecting patient's functional outcome.    REHAB POTENTIAL: Good   CLINICAL DECISION MAKING: Stable/uncomplicated   EVALUATION COMPLEXITY: Low     GOALS: SHORT TERM GOALS: Target date: 02/15/2022   Patient will be independent with initial HEP and self-management strategies to improve functional outcomes Baseline:  Goal status: INITIAL    LONG TERM GOALS: Target date: 03/01/2022   Patient will be independent with advanced HEP and self-management strategies to improve functional outcomes Baseline:  Goal status: INITIAL   2.  Patient will improve FOTO score to predicted value to indicate improvement in functional outcomes Baseline: 53% function  Goal status: INITIAL   3.  Patient will demo improved LT shoulder flexion and abduction to within 5 degrees of contralateral side in order to improve ability to perform [place] ADLs such as dressing, grooming and cleaning.  Baseline: See AROM  Goal status: INITIAL   4. Patient will have improved functional LT shoulder IR to at least L1 level for improved ability to don pants/ belt with ease and less pain Baseline: See AROM  Goal status: INITIAL   PLAN: PT FREQUENCY: 1-2x/week   PT DURATION: 4 weeks   PLANNED INTERVENTIONS: Therapeutic exercises, Therapeutic activity, Neuromuscular re-education, Balance training, Gait training, Patient/Family education, Joint manipulation, Joint mobilization, Stair training, Aquatic Therapy, Dry Needling, Electrical stimulation, Spinal manipulation, Spinal mobilization, Cryotherapy, Moist heat, scar mobilization, Taping, Traction, Ultrasound, Biofeedback,  Ionotophoresis '4mg'$ /ml Dexamethasone, and Manual therapy.     PLAN FOR NEXT SESSION: Progress shoulder stretch and scapular strengthening. GHJ AROM progressing as tolerated. Manual and DN as indicated  9:53 AM, 02/06/22 Josue Hector PT DPT  Physical Therapist with [facility]  [phone]

## 2022-02-09 ENCOUNTER — Ambulatory Visit (HOSPITAL_COMMUNITY): Payer: 59 | Admitting: Physical Therapy

## 2022-02-09 DIAGNOSIS — R29898 Other symptoms and signs involving the musculoskeletal system: Secondary | ICD-10-CM

## 2022-02-09 DIAGNOSIS — M25612 Stiffness of left shoulder, not elsewhere classified: Secondary | ICD-10-CM

## 2022-02-09 DIAGNOSIS — M25512 Pain in left shoulder: Secondary | ICD-10-CM

## 2022-02-09 NOTE — Therapy (Signed)
OUTPATIENT PHYSICAL THERAPY TREATMENT NOTE   Patient Name: Dana Hanson MRN: [id number] DOB:[birth year], 61 y.o., female Today's Date: 02/09/2022  PCP: [name] MD REFERRING PROVIDER: Carole Civil, MD   END OF SESSION:   PT End of Session - 02/09/22 0943     Visit Number 4    Number of Visits 8    Date for PT Re-Evaluation 03/01/22    Authorization Type Generic [employer]    PT Start Time [phone]    PT Stop Time 1023    PT Time Calculation (min) 40 min    Activity Tolerance Patient tolerated treatment well    Behavior During Therapy [facility] for tasks assessed/performed             Past Medical History:  Diagnosis Date   Cancer ([place])    kidney   Gallstone pancreatitis    Hyperlipidemia    IBS (irritable bowel syndrome)    patient denies   Right renal mass    Past Surgical History:  Procedure Laterality Date   CESAREAN SECTION     x 2-tubal with last c-section   CHOLECYSTECTOMY N/A 09/03/2015   Procedure: LAPAROSCOPIC CHOLECYSTECTOMY WITH INTRAOPERATIVE CHOLANGIOGRAM;  Surgeon: Mickeal Skinner, MD;  Location: [facility];  Service: General;  Laterality: N/A;   COLONOSCOPY  08/14/2011   Surgeon: Daneil Dolin, MD; external hemorrhoid tags, likely source of hematochezia, normal rectum, multiple colonic polyps at the base of the cecum, all less than 5 mm, single 4 mm sigmoid polyp, scattered diverticula.  Pathology revealed tubular adenoma in the cecum, hyperplastic polyp in the sigmoid colon.  Recommended repeat colonoscopy in 3 years.   COLONOSCOPY WITH PROPOFOL N/A 11/30/2021   Procedure: COLONOSCOPY WITH PROPOFOL;  Surgeon: Daneil Dolin, MD;  [facility];  Service: Endoscopy;  Laterality: N/A;  1:45pm   HEMORRHOID SURGERY  11/10/2011   Procedure: HEMORRHOIDECTOMY;  Surgeon: Donato Heinz, MD;  [facility];  Service: General;  Laterality: N/A;   [place] RESECTION  01/15/2015   LAPAROSCOPIC NEPHRECTOMY N/A 09/03/2015    Procedure: LAPAROSCOPIC RADICAL NEPHRECTOMY;  Surgeon: Ardis Hughs, MD;  [facility];  Service: Urology;  Laterality: N/A;   POLYPECTOMY  11/30/2021   Procedure: POLYPECTOMY;  Surgeon: Daneil Dolin, MD;  [facility];  Service: Endoscopy;;   TUBAL LIGATION     with last c-section   VERTEBROPLASTY N/A 09/09/2021   Procedure: Thoracic vertebral biopsy;  Surgeon: Ashok Pall, MD;  Location: [place];  Service: Neurosurgery;  Laterality: N/A;  RM 21   Patient Active Problem List   Diagnosis Date Noted   Closed fracture of proximal end of left humerus with routine healing 01/25/2022   History of colonic polyps 10/12/2021   Abnormal PET scan of colon 10/12/2021   Metastatic renal cell carcinoma to bone ([place]) 09/27/2021   Cancer of right kidney ([place]) 09/03/2015   Renal mass 09/03/2015   Pancreatitis 08/21/2015   Biliary obstruction    Primary osteoarthritis of right knee 03/17/2014   Hemorrhoids 08/01/2011   Heme positive stool 08/01/2011   Rectal bleed 08/01/2011    REFERRING DIAG: PT eval/tx for proximal humerus   THERAPY DIAG:  Stiffness of left shoulder joint  Pain in joint of left shoulder  Other symptoms and signs involving the musculoskeletal system  Rationale for Evaluation and Treatment Rehabilitation  PERTINENT HISTORY: LT humrous fx 10/23/21   PRECAUTIONS: none  SUBJECTIVE: Patient states arm is stiff. She thinks she  slept on her arm. Her back is sore also.   PAIN:  Are you having pain? No (no pain, just stiff)  OBJECTIVE:    DIAGNOSTIC FINDINGS:  NA   PATIENT SURVEYS:  FOTO 53% function   COGNITION:           Overall cognitive status: Within functional limits for tasks assessed                                  SENSATION: WFL   POSTURE: Forward shoulder    UPPER EXTREMITY ROM:    Active ROM Right eval Left eval LT 02/09/22  Shoulder flexion 130 115 130  Shoulder extension       Shoulder abduction 165 115 120  Shoulder  adduction       Shoulder internal rotation T7 Ilium L4  Shoulder external rotation T3 T1   Elbow flexion       Elbow extension       Wrist flexion       Wrist extension       Wrist ulnar deviation       Wrist radial deviation       Wrist pronation       Wrist supination       (Blank rows = not tested)   UPPER EXTREMITY MMT:   MMT Right eval Left eval  Shoulder flexion 5 5  Shoulder extension      Shoulder abduction 5 4+  Shoulder adduction      Shoulder internal rotation 5 5  Shoulder external rotation 5 4+  Middle trapezius      Lower trapezius      Elbow flexion      Elbow extension      Wrist flexion      Wrist extension      Wrist ulnar deviation      Wrist radial deviation      Wrist pronation      Wrist supination      Grip strength (lbs)      (Blank rows = not tested)     PALPATION:  Min/ Mod TTP about LT upper trap, levator, scapular border              TODAY'S TREATMENT:  02/09/22 Shoulder adduction stretch 3 x 20" each Shoulder IR with strap 5 x 10" Shoulder flexion at wall with self STM release using tennis ball on posterior deltoid 3 min    Prone row 3lb 2 x 10 Prone YTI (unilateral LUE) 1lb 2 x 10 each     LT GHJ inf/ post glides grade II-III LT GHJ PROM flexion/ abduction/ IR  Thoracic P/ A grade II-III    02/06/22 Shoulder adduction stretch 3 x 20" each Shoulder IR with strap 5 x 10"  Band row GTB 2 x 10 Shoulder extension GTB 2 x 10 Bilateral shoulder ER G TB 2 x 10 High row GTB 2 x 10 Lat pull down 3 plates 2 x 10 Standing pec stretch in door 3 x 30" (low, mid, high)   LT GHJ inf/ post glides grade II-III LT GHJ PROM flexion/ abduction Thoracic P/ A grade II-III    Trigger Point Dry-Needling  Treatment instructions: Expect mild to moderate muscle soreness. S/S of pneumothorax if dry needled over a lung field, and to seek immediate medical attention should they occur. Patient verbalized understanding of these instructions and  education.  Patient Consent  Given: Yes Education handout provided: Yes Muscles treated: LT upper trap  Electrical stimulation performed: No Parameters: N/A Treatment response/outcome: good tolerance   Manual STM to LT upper trap, levator pre and post dry needling for trigger point identification and surface area preparation    02/02/22  LT GHJ inf/ post glides grade II-III LT GHJ PROM flexion/ abduction/ IR   Trigger Point Dry-Needling  Treatment instructions: Expect mild to moderate muscle soreness. S/S of pneumothorax if dry needled over a lung field, and to seek immediate medical attention should they occur. Patient verbalized understanding of these instructions and education.  Patient Consent Given: Yes Education handout provided: Yes Muscles treated: LT upper trap  Electrical stimulation performed: No Parameters: N/A Treatment response/outcome: good tolerance   Manual STM to LT upper trap, levator pre and post dry needling for trigger point identification and surface area preparation     Shoulder adduction stretch 3 x20" Doorway stretch 3 x 20"  Supine thoracic extension with shoulder flexion (towel roll at bra line) x10    02/01/22 Eval      PATIENT EDUCATION: Education details: on Eval findings, POC and HEP  Person educated: Patient Education method: Explanation Education comprehension: verbalized understanding     HOME EXERCISE PROGRAM: Access Code: [email or web address] URL: [email or web address] Date: 02/02/2022 Prepared by: Josue Hector  Exercises - Thoracic Extension Mobilization with Noodle  - 2-3 x daily - 7 x weekly - 2 sets - 10 reps - Standing Shoulder Posterior Capsule Stretch  - 2-3 x daily - 7 x weekly - 1 sets - 4 reps - 20-30 second hold - Single Arm Doorway Pec Stretch at 90 Degrees Abduction  - 2-3 x daily - 7 x weekly - 1 sets - 4 reps - 20-30 second hold   ASSESSMENT:   CLINICAL IMPRESSION: Patient  progressing well. Showing  improved shoulder AROM today following manual treatment. Patein well challenged with prone scapular series. Requires verbal and tactile cues for arm position and retracting scapular prior to movement. Patient will continue to benefit from skilled therapy services to reduce remaining deficits and improve functional ability.    OBJECTIVE IMPAIRMENTS decreased activity tolerance, decreased mobility, decreased ROM, decreased strength, increased fascial restrictions, impaired flexibility, impaired UE functional use, improper body mechanics, postural dysfunction, and pain.    ACTIVITY LIMITATIONS carrying, lifting, sleeping, and reach over head   PARTICIPATION LIMITATIONS: meal prep, cleaning, laundry, driving, shopping, community activity, occupation, and yard work   PERSONAL FACTORS  None  are also affecting patient's functional outcome.    REHAB POTENTIAL: Good   CLINICAL DECISION MAKING: Stable/uncomplicated   EVALUATION COMPLEXITY: Low     GOALS: SHORT TERM GOALS: Target date: 02/15/2022   Patient will be independent with initial HEP and self-management strategies to improve functional outcomes Baseline:  Goal status: INITIAL    LONG TERM GOALS: Target date: 03/01/2022   Patient will be independent with advanced HEP and self-management strategies to improve functional outcomes Baseline:  Goal status: INITIAL   2.  Patient will improve FOTO score to predicted value to indicate improvement in functional outcomes Baseline: 53% function  Goal status: INITIAL   3.  Patient will demo improved LT shoulder flexion and abduction to within 5 degrees of contralateral side in order to improve ability to perform [place] ADLs such as dressing, grooming and cleaning.  Baseline: See AROM  Goal status: INITIAL   4. Patient will have improved functional LT shoulder IR to at least L1 level for improved ability to don  pants/ belt with ease and less pain Baseline: See AROM  Goal status: INITIAL    PLAN: PT FREQUENCY: 1-2x/week   PT DURATION: 4 weeks   PLANNED INTERVENTIONS: Therapeutic exercises, Therapeutic activity, Neuromuscular re-education, Balance training, Gait training, Patient/Family education, Joint manipulation, Joint mobilization, Stair training, Aquatic Therapy, Dry Needling, Electrical stimulation, Spinal manipulation, Spinal mobilization, Cryotherapy, Moist heat, scar mobilization, Taping, Traction, Ultrasound, Biofeedback, Ionotophoresis '4mg'$ /ml Dexamethasone, and Manual therapy.     PLAN FOR NEXT SESSION: Progress shoulder stretch and scapular strengthening. GHJ AROM progressing as tolerated. Manual and DN as indicated      9:44 AM, 02/09/22 Josue Hector PT DPT  Physical Therapist with [facility]  [phone]

## 2022-02-10 ENCOUNTER — Ambulatory Visit: Payer: 59 | Admitting: Physician Assistant

## 2022-02-10 ENCOUNTER — Other Ambulatory Visit: Payer: 59

## 2022-02-10 ENCOUNTER — Ambulatory Visit: Payer: 59

## 2022-02-13 ENCOUNTER — Telehealth: Payer: Self-pay | Admitting: Oncology

## 2022-02-13 NOTE — Telephone Encounter (Signed)
Called patient regarding upcoming August and September appointments, patient is notified.

## 2022-02-14 ENCOUNTER — Ambulatory Visit (HOSPITAL_COMMUNITY): Payer: 59 | Attending: Orthopedic Surgery | Admitting: Physical Therapy

## 2022-02-14 ENCOUNTER — Encounter (HOSPITAL_COMMUNITY): Payer: Self-pay | Admitting: Physical Therapy

## 2022-02-14 DIAGNOSIS — M25512 Pain in left shoulder: Secondary | ICD-10-CM | POA: Diagnosis present

## 2022-02-14 DIAGNOSIS — M25612 Stiffness of left shoulder, not elsewhere classified: Secondary | ICD-10-CM | POA: Diagnosis present

## 2022-02-14 DIAGNOSIS — R29898 Other symptoms and signs involving the musculoskeletal system: Secondary | ICD-10-CM | POA: Diagnosis present

## 2022-02-14 NOTE — Therapy (Signed)
OUTPATIENT PHYSICAL THERAPY TREATMENT NOTE   Patient Name: THALYA FOUCHE MRN: [id number] DOB:05-23-1961, 61 y.o., female Today's Date: 02/14/2022  PCP: [name] MD REFERRING PROVIDER: Carole Civil, MD   END OF SESSION:   PT End of Session - 02/14/22 0905     Visit Number 5    Number of Visits 8    Date for PT Re-Evaluation 03/01/22    Authorization Type Generic [employer]    PT Start Time 0904    PT Stop Time 0942    PT Time Calculation (min) 38 min    Activity Tolerance Patient tolerated treatment well    Behavior During Therapy [facility] for tasks assessed/performed             Past Medical History:  Diagnosis Date   Cancer ([place])    kidney   Gallstone pancreatitis    Hyperlipidemia    IBS (irritable bowel syndrome)    patient denies   Right renal mass    Past Surgical History:  Procedure Laterality Date   CESAREAN SECTION     x 2-tubal with last c-section   CHOLECYSTECTOMY N/A 09/03/2015   Procedure: LAPAROSCOPIC CHOLECYSTECTOMY WITH INTRAOPERATIVE CHOLANGIOGRAM;  Surgeon: Mickeal Skinner, MD;  [facility]: [facility];  Service: General;  Laterality: N/A;   COLONOSCOPY  08/14/2011   Surgeon: Daneil Dolin, MD; external hemorrhoid tags, likely source of hematochezia, normal rectum, multiple colonic polyps at the base of the cecum, all less than 5 mm, single 4 mm sigmoid polyp, scattered diverticula.  Pathology revealed tubular adenoma in the cecum, hyperplastic polyp in the sigmoid colon.  Recommended repeat colonoscopy in 3 years.   COLONOSCOPY WITH PROPOFOL N/A 11/30/2021   Procedure: COLONOSCOPY WITH PROPOFOL;  Surgeon: Daneil Dolin, MD;  [facility];  Service: Endoscopy;  Laterality: N/A;  1:45pm   HEMORRHOID SURGERY  11/10/2011   Procedure: HEMORRHOIDECTOMY;  Surgeon: Donato Heinz, MD;  Location: [facility];  Service: General;  Laterality: N/A;   [place] RESECTION  01/15/2015   LAPAROSCOPIC NEPHRECTOMY N/A 09/03/2015    Procedure: LAPAROSCOPIC RADICAL NEPHRECTOMY;  Surgeon: Ardis Hughs, MD;  [facility];  Service: Urology;  Laterality: N/A;   POLYPECTOMY  11/30/2021   Procedure: POLYPECTOMY;  Surgeon: Daneil Dolin, MD;  [facility];  Service: Endoscopy;;   TUBAL LIGATION     with last c-section   VERTEBROPLASTY N/A 09/09/2021   Procedure: Thoracic vertebral biopsy;  Surgeon: Ashok Pall, MD;  Location: [facility];  Service: Neurosurgery;  Laterality: N/A;  RM 21   Patient Active Problem List   Diagnosis Date Noted   Closed fracture of proximal end of left humerus with routine healing 01/25/2022   History of colonic polyps 10/12/2021   Abnormal PET scan of colon 10/12/2021   Metastatic renal cell carcinoma to bone ([place]) 09/27/2021   Cancer of right kidney ([place]) 09/03/2015   Renal mass 09/03/2015   Pancreatitis 08/21/2015   Biliary obstruction    Primary osteoarthritis of right knee 03/17/2014   Hemorrhoids 08/01/2011   Heme positive stool 08/01/2011   Rectal bleed 08/01/2011    REFERRING DIAG: PT eval/tx for proximal humerus   THERAPY DIAG:  Stiffness of left shoulder joint  Pain in joint of left shoulder  Other symptoms and signs involving the musculoskeletal system  Rationale for Evaluation and Treatment Rehabilitation  PERTINENT HISTORY: LT humrous fx 10/23/21   PRECAUTIONS: none  SUBJECTIVE: Patient states she has had a stressful morning.  Patient states continued tightness in shoulder. She wasn't able to do exercises at home this weekend while taking care of family. Felt more benefit from mobilizations. Felt self STM was helpful.   PAIN:  Are you having pain? No (no pain, just stiff)  OBJECTIVE:    DIAGNOSTIC FINDINGS:  NA   PATIENT SURVEYS:  FOTO 53% function   COGNITION:           Overall cognitive status: Within functional limits for tasks assessed                                  SENSATION: WFL   POSTURE: Forward shoulder    UPPER EXTREMITY  ROM:    Active ROM Right eval Left eval LT 02/09/22 Left 02/14/22  Shoulder flexion 130 115 130 125 140 following manual  Shoulder extension        Shoulder abduction 165 115 120 110 126 following manual  Shoulder adduction        Shoulder internal rotation T7 Ilium L4 PSIS  Shoulder external rotation T3 T1    Elbow flexion        Elbow extension        Wrist flexion        Wrist extension        Wrist ulnar deviation        Wrist radial deviation        Wrist pronation        Wrist supination        (Blank rows = not tested)   UPPER EXTREMITY MMT:   MMT Right eval Left eval  Shoulder flexion 5 5  Shoulder extension      Shoulder abduction 5 4+  Shoulder adduction      Shoulder internal rotation 5 5  Shoulder external rotation 5 4+  Middle trapezius      Lower trapezius      Elbow flexion      Elbow extension      Wrist flexion      Wrist extension      Wrist ulnar deviation      Wrist radial deviation      Wrist pronation      Wrist supination      Grip strength (lbs)      (Blank rows = not tested)     PALPATION:  Min/ Mod TTP about LT upper trap, levator, scapular border              TODAY'S TREATMENT:  02/14/22 Supine Grade II-III inferior glides of left GH joint with progressive L shoulder abduction Supine Grade II-III inferior glides of Left GH joint with progressive L shoulder flexion Shoulder flexion at wall with self STM release using tennis ball on posterior deltoid 3 min  Shoulder IR with strap 5x 10 - 20 second holds Supine PNF D2 pattern with RTB 2x 15  Standing shoulder horizontal abduction 2x 10 RTB   02/09/22 Shoulder adduction stretch 3 x 20" each Shoulder IR with strap 5 x 10" Shoulder flexion at wall with self STM release using tennis ball on posterior deltoid 3 min    Prone row 3lb 2 x 10 Prone YTI (unilateral LUE) 1lb 2 x 10 each     LT GHJ inf/ post glides grade II-III LT GHJ PROM flexion/ abduction/ IR  Thoracic P/ A grade  II-III    02/06/22 Shoulder adduction stretch 3 x 20"  each Shoulder IR with strap 5 x 10"  Band row GTB 2 x 10 Shoulder extension GTB 2 x 10 Bilateral shoulder ER G TB 2 x 10 High row GTB 2 x 10 Lat pull down 3 plates 2 x 10 Standing pec stretch in door 3 x 30" (low, mid, high)   LT GHJ inf/ post glides grade II-III LT GHJ PROM flexion/ abduction Thoracic P/ A grade II-III    Trigger Point Dry-Needling  Treatment instructions: Expect mild to moderate muscle soreness. S/S of pneumothorax if dry needled over a lung field, and to seek immediate medical attention should they occur. Patient verbalized understanding of these instructions and education.  Patient Consent Given: Yes Education handout provided: Yes Muscles treated: LT upper trap  Electrical stimulation performed: No Parameters: N/A Treatment response/outcome: good tolerance   Manual STM to LT upper trap, levator pre and post dry needling for trigger point identification and surface area preparation    02/02/22  LT GHJ inf/ post glides grade II-III LT GHJ PROM flexion/ abduction/ IR   Trigger Point Dry-Needling  Treatment instructions: Expect mild to moderate muscle soreness. S/S of pneumothorax if dry needled over a lung field, and to seek immediate medical attention should they occur. Patient verbalized understanding of these instructions and education.  Patient Consent Given: Yes Education handout provided: Yes Muscles treated: LT upper trap  Electrical stimulation performed: No Parameters: N/A Treatment response/outcome: good tolerance   Manual STM to LT upper trap, levator pre and post dry needling for trigger point identification and surface area preparation     Shoulder adduction stretch 3 x20" Doorway stretch 3 x 20"  Supine thoracic extension with shoulder flexion (towel roll at bra line) x10    02/01/22 Eval      PATIENT EDUCATION: Education details: on Eval findings, POC and HEP  Person  educated: Patient Education method: Explanation Education comprehension: verbalized understanding     HOME EXERCISE PROGRAM: Access Code: [email or web address] URL: [email or web address] 02/14/22 - Standing Shoulder Single Arm PNF D2 Flexion with Resistance (Mirrored)  - 1 x daily - 7 x weekly - 3 sets - 10-15 reps - Standing Shoulder Horizontal Abduction with Resistance  - 1 x daily - 7 x weekly - 3 sets - 10 reps  Date: 02/02/2022 - Thoracic Extension Mobilization with Noodle  - 2-3 x daily - 7 x weekly - 2 sets - 10 reps - Standing Shoulder Posterior Capsule Stretch  - 2-3 x daily - 7 x weekly - 1 sets - 4 reps - 20-30 second hold - Single Arm Doorway Pec Stretch at 90 Degrees Abduction  - 2-3 x daily - 7 x weekly - 1 sets - 4 reps - 20-30 second hold   ASSESSMENT:   CLINICAL IMPRESSION: Patient with decreased ROM compared to prior sessions but improves to 140 flexion AROM and 126 abduction AROM following inferior glide mobilizations of L glenohumeral joint. Patient completes self STM with ball to posterior delt with decrease in symptoms also following. Added PNF exercise for RC activation and strength in new range obtained from manual. Patient will continue to benefit from physical therapy in order to improve function and reduce impairment.    OBJECTIVE IMPAIRMENTS decreased activity tolerance, decreased mobility, decreased ROM, decreased strength, increased fascial restrictions, impaired flexibility, impaired UE functional use, improper body mechanics, postural dysfunction, and pain.    ACTIVITY LIMITATIONS carrying, lifting, sleeping, and reach over head   PARTICIPATION LIMITATIONS: meal prep, cleaning, laundry, driving, shopping, community activity, occupation, and  yard work   PERSONAL FACTORS  None  are also affecting patient's functional outcome.    REHAB POTENTIAL: Good   CLINICAL DECISION MAKING: Stable/uncomplicated   EVALUATION COMPLEXITY: Low     GOALS: SHORT TERM  GOALS: Target date: 02/15/2022   Patient will be independent with initial HEP and self-management strategies to improve functional outcomes Baseline:  Goal status: INITIAL    LONG TERM GOALS: Target date: 03/01/2022   Patient will be independent with advanced HEP and self-management strategies to improve functional outcomes Baseline:  Goal status: INITIAL   2.  Patient will improve FOTO score to predicted value to indicate improvement in functional outcomes Baseline: 53% function  Goal status: INITIAL   3.  Patient will demo improved LT shoulder flexion and abduction to within 5 degrees of contralateral side in order to improve ability to perform OH ADLs such as dressing, grooming and cleaning.  Baseline: See AROM  Goal status: INITIAL   4. Patient will have improved functional LT shoulder IR to at least L1 level for improved ability to don pants/ belt with ease and less pain Baseline: See AROM  Goal status: INITIAL   PLAN: PT FREQUENCY: 1-2x/week   PT DURATION: 4 weeks   PLANNED INTERVENTIONS: Therapeutic exercises, Therapeutic activity, Neuromuscular re-education, Balance training, Gait training, Patient/Family education, Joint manipulation, Joint mobilization, Stair training, Aquatic Therapy, Dry Needling, Electrical stimulation, Spinal manipulation, Spinal mobilization, Cryotherapy, Moist heat, scar mobilization, Taping, Traction, Ultrasound, Biofeedback, Ionotophoresis '4mg'$ /ml Dexamethasone, and Manual therapy.     PLAN FOR NEXT SESSION: Progress shoulder stretch and scapular strengthening. GHJ AROM progressing as tolerated. Manual and DN as indicated      9:06 AM, 02/14/22 Mearl Latin PT, DPT Physical Therapist at [facility]

## 2022-02-16 ENCOUNTER — Encounter (HOSPITAL_COMMUNITY): Payer: Self-pay | Admitting: Physical Therapy

## 2022-02-16 ENCOUNTER — Ambulatory Visit (HOSPITAL_COMMUNITY): Payer: 59 | Admitting: Physical Therapy

## 2022-02-16 DIAGNOSIS — M25512 Pain in left shoulder: Secondary | ICD-10-CM

## 2022-02-16 DIAGNOSIS — R29898 Other symptoms and signs involving the musculoskeletal system: Secondary | ICD-10-CM

## 2022-02-16 DIAGNOSIS — M25612 Stiffness of left shoulder, not elsewhere classified: Secondary | ICD-10-CM

## 2022-02-16 NOTE — Therapy (Signed)
OUTPATIENT PHYSICAL THERAPY TREATMENT NOTE   Patient Name: ALESSANDRA SAWDEY MRN: [id number] DOB:June 26, 1961, 61 y.o., female Today's Date: 02/16/2022  PCP: [name] MD REFERRING PROVIDER: Carole Civil, MD   END OF SESSION:   PT End of Session - 02/16/22 0909     Visit Number 6    Number of Visits 8    Date for PT Re-Evaluation 03/01/22    Authorization Type Generic [employer]    PT Start Time 0907    PT Stop Time 0945    PT Time Calculation (min) 38 min    Activity Tolerance Patient tolerated treatment well    Behavior During Therapy [facility] for tasks assessed/performed             Past Medical History:  Diagnosis Date   Cancer ([place])    kidney   Gallstone pancreatitis    Hyperlipidemia    IBS (irritable bowel syndrome)    patient denies   Right renal mass    Past Surgical History:  Procedure Laterality Date   CESAREAN SECTION     x 2-tubal with last c-section   CHOLECYSTECTOMY N/A 09/03/2015   Procedure: LAPAROSCOPIC CHOLECYSTECTOMY WITH INTRAOPERATIVE CHOLANGIOGRAM;  Surgeon: Mickeal Skinner, MD;  [facility];  Service: General;  Laterality: N/A;   COLONOSCOPY  08/14/2011   Surgeon: Daneil Dolin, MD; external hemorrhoid tags, likely source of hematochezia, normal rectum, multiple colonic polyps at the base of the cecum, all less than 5 mm, single 4 mm sigmoid polyp, scattered diverticula.  Pathology revealed tubular adenoma in the cecum, hyperplastic polyp in the sigmoid colon.  Recommended repeat colonoscopy in 3 years.   COLONOSCOPY WITH PROPOFOL N/A 11/30/2021   Procedure: COLONOSCOPY WITH PROPOFOL;  Surgeon: Daneil Dolin, MD;  [facility];  Service: Endoscopy;  Laterality: N/A;  1:45pm   HEMORRHOID SURGERY  11/10/2011   Procedure: HEMORRHOIDECTOMY;  Surgeon: Donato Heinz, MD;  [facility];  Service: General;  Laterality: N/A;   [place] RESECTION  01/15/2015   LAPAROSCOPIC NEPHRECTOMY N/A 09/03/2015    Procedure: LAPAROSCOPIC RADICAL NEPHRECTOMY;  Surgeon: Ardis Hughs, MD;  [facility];  Service: Urology;  Laterality: N/A;   POLYPECTOMY  11/30/2021   Procedure: POLYPECTOMY;  Surgeon: Daneil Dolin, MD;  [facility];  Service: Endoscopy;;   TUBAL LIGATION     with last c-section   VERTEBROPLASTY N/A 09/09/2021   Procedure: Thoracic vertebral biopsy;  Surgeon: Ashok Pall, MD;  Location: [facility];  Service: Neurosurgery;  Laterality: N/A;  RM 21   Patient Active Problem List   Diagnosis Date Noted   Closed fracture of proximal end of left humerus with routine healing 01/25/2022   History of colonic polyps 10/12/2021   Abnormal PET scan of colon 10/12/2021   Metastatic renal cell carcinoma to bone ([place]) 09/27/2021   Cancer of right kidney ([place]) 09/03/2015   Renal mass 09/03/2015   Pancreatitis 08/21/2015   Biliary obstruction    Primary osteoarthritis of right knee 03/17/2014   Hemorrhoids 08/01/2011   Heme positive stool 08/01/2011   Rectal bleed 08/01/2011    REFERRING DIAG: PT eval/tx for proximal humerus   THERAPY DIAG:  Stiffness of left shoulder joint  Pain in joint of left shoulder  Other symptoms and signs involving the musculoskeletal system  Rationale for Evaluation and Treatment Rehabilitation  PERTINENT HISTORY: LT humrous fx 10/23/21   PRECAUTIONS: none  SUBJECTIVE: Patient reports ongoing difficulty caring for her mother.  She has not been sleeping well. Shoulder is stiff today. No pain, just stiff.  PAIN:  Are you having pain? No (no pain, just stiff)  OBJECTIVE:    DIAGNOSTIC FINDINGS:  NA   PATIENT SURVEYS:  FOTO 53% function   COGNITION:           Overall cognitive status: Within functional limits for tasks assessed                                  SENSATION: WFL   POSTURE: Forward shoulder    UPPER EXTREMITY ROM:    Active ROM Right eval Left eval LT 02/09/22 Left 02/14/22  Shoulder flexion 130 115 130 125 140  following manual  Shoulder extension        Shoulder abduction 165 115 120 110 126 following manual  Shoulder adduction        Shoulder internal rotation T7 Ilium L4 PSIS  Shoulder external rotation T3 T1    Elbow flexion        Elbow extension        Wrist flexion        Wrist extension        Wrist ulnar deviation        Wrist radial deviation        Wrist pronation        Wrist supination        (Blank rows = not tested)   UPPER EXTREMITY MMT:   MMT Right eval Left eval  Shoulder flexion 5 5  Shoulder extension      Shoulder abduction 5 4+  Shoulder adduction      Shoulder internal rotation 5 5  Shoulder external rotation 5 4+  Middle trapezius      Lower trapezius      Elbow flexion      Elbow extension      Wrist flexion      Wrist extension      Wrist ulnar deviation      Wrist radial deviation      Wrist pronation      Wrist supination      Grip strength (lbs)      (Blank rows = not tested)     PALPATION:  Min/ Mod TTP about LT upper trap, levator, scapular border              TODAY'S TREATMENT:  02/16/22 LT GHJ inf/ post glides grade II-III LT GHJ PROM flexion/ abduction/ IR  Thoracic P/ A grade II-III   Band rows BTB 10 x 5" Shoulder extension BTB 10 x 5" Shoulder bilat ER RTB 10 x 5" Shoulder horiz abduction RTB 10 x 5" Ball on wall stabilization red med ball x20 CW/ CCW  UBE 2/2 lv 2     02/14/22 Supine Grade II-III inferior glides of left GH joint with progressive L shoulder abduction Supine Grade II-III inferior glides of Left GH joint with progressive L shoulder flexion Shoulder flexion at wall with self STM release using tennis ball on posterior deltoid 3 min  Shoulder IR with strap 5x 10 - 20 second holds Supine PNF D2 pattern with RTB 2x 15  Standing shoulder horizontal abduction 2x 10 RTB   02/09/22 Shoulder adduction stretch 3 x 20" each Shoulder IR with strap 5 x 10" Shoulder flexion at wall with self STM release using tennis ball  on posterior deltoid 3 min  Prone row 3lb 2 x 10 Prone YTI (unilateral LUE) 1lb 2 x 10 each     LT GHJ inf/ post glides grade II-III LT GHJ PROM flexion/ abduction/ IR  Thoracic P/ A grade II-III    Trigger Point Dry-Needling  Treatment instructions: Expect mild to moderate muscle soreness. S/S of pneumothorax if dry needled over a lung field, and to seek immediate medical attention should they occur. Patient verbalized understanding of these instructions and education.  Patient Consent Given: Yes Education handout provided: Yes Muscles treated: LT upper trap  Electrical stimulation performed: No Parameters: N/A Treatment response/outcome: good tolerance   Manual STM to LT upper trap, levator pre and post dry needling for trigger point identification and surface area preparation    02/02/22  LT GHJ inf/ post glides grade II-III LT GHJ PROM flexion/ abduction/ IR   Trigger Point Dry-Needling  Treatment instructions: Expect mild to moderate muscle soreness. S/S of pneumothorax if dry needled over a lung field, and to seek immediate medical attention should they occur. Patient verbalized understanding of these instructions and education.  Patient Consent Given: Yes Education handout provided: Yes Muscles treated: LT upper trap  Electrical stimulation performed: No Parameters: N/A Treatment response/outcome: good tolerance   Manual STM to LT upper trap, levator pre and post dry needling for trigger point identification and surface area preparation     Shoulder adduction stretch 3 x20" Doorway stretch 3 x 20"  Supine thoracic extension with shoulder flexion (towel roll at bra line) x10    02/01/22 Eval      PATIENT EDUCATION: Education details: on Eval findings, POC and HEP  Person educated: Patient Education method: Explanation Education comprehension: verbalized understanding     HOME EXERCISE PROGRAM: Access Code: [email or web address] URL:  [email or web address] 02/14/22 - Standing Shoulder Single Arm PNF D2 Flexion with Resistance (Mirrored)  - 1 x daily - 7 x weekly - 3 sets - 10-15 reps - Standing Shoulder Horizontal Abduction with Resistance  - 1 x daily - 7 x weekly - 3 sets - 10 reps  Date: 02/02/2022 - Thoracic Extension Mobilization with Noodle  - 2-3 x daily - 7 x weekly - 2 sets - 10 reps - Standing Shoulder Posterior Capsule Stretch  - 2-3 x daily - 7 x weekly - 1 sets - 4 reps - 20-30 second hold - Single Arm Doorway Pec Stretch at 90 Degrees Abduction  - 2-3 x daily - 7 x weekly - 1 sets - 4 reps - 20-30 second hold   ASSESSMENT:   CLINICAL IMPRESSION: Patient tolerated session well today. Showing improved shoulder AROM overall. Improved tolerance to manual stretching and joint mobilizations. Added ball on wall for improved scapular stabilization in elevated position. Patient cued on proper form and hand position. Patient will continue to benefit from skilled therapy services to reduce remaining deficits and improve functional ability.     OBJECTIVE IMPAIRMENTS decreased activity tolerance, decreased mobility, decreased ROM, decreased strength, increased fascial restrictions, impaired flexibility, impaired UE functional use, improper body mechanics, postural dysfunction, and pain.    ACTIVITY LIMITATIONS carrying, lifting, sleeping, and reach over head   PARTICIPATION LIMITATIONS: meal prep, cleaning, laundry, driving, shopping, community activity, occupation, and yard work   PERSONAL FACTORS  None  are also affecting patient's functional outcome.    REHAB POTENTIAL: Good   CLINICAL DECISION MAKING: Stable/uncomplicated   EVALUATION COMPLEXITY: Low     GOALS: SHORT TERM GOALS: Target date: 02/15/2022   Patient will be independent with initial  HEP and self-management strategies to improve functional outcomes Baseline:  Goal status: INITIAL    LONG TERM GOALS: Target date: 03/01/2022   Patient  will be independent with advanced HEP and self-management strategies to improve functional outcomes Baseline:  Goal status: INITIAL   2.  Patient will improve FOTO score to predicted value to indicate improvement in functional outcomes Baseline: 53% function  Goal status: INITIAL   3.  Patient will demo improved LT shoulder flexion and abduction to within 5 degrees of contralateral side in order to improve ability to perform OH ADLs such as dressing, grooming and cleaning.  Baseline: See AROM  Goal status: INITIAL   4. Patient will have improved functional LT shoulder IR to at least L1 level for improved ability to don pants/ belt with ease and less pain Baseline: See AROM  Goal status: INITIAL   PLAN: PT FREQUENCY: 1-2x/week   PT DURATION: 4 weeks   PLANNED INTERVENTIONS: Therapeutic exercises, Therapeutic activity, Neuromuscular re-education, Balance training, Gait training, Patient/Family education, Joint manipulation, Joint mobilization, Stair training, Aquatic Therapy, Dry Needling, Electrical stimulation, Spinal manipulation, Spinal mobilization, Cryotherapy, Moist heat, scar mobilization, Taping, Traction, Ultrasound, Biofeedback, Ionotophoresis '4mg'$ /ml Dexamethasone, and Manual therapy.     PLAN FOR NEXT SESSION: Progress shoulder stretch and scapular strengthening. GHJ AROM progressing as tolerated. Manual and DN as indicated   9:09 AM, 02/16/22 Josue Hector PT DPT  Physical Therapist with [facility]  [phone]

## 2022-02-20 ENCOUNTER — Ambulatory Visit (HOSPITAL_COMMUNITY): Payer: 59 | Admitting: Physical Therapy

## 2022-02-20 DIAGNOSIS — M25512 Pain in left shoulder: Secondary | ICD-10-CM

## 2022-02-20 DIAGNOSIS — M25612 Stiffness of left shoulder, not elsewhere classified: Secondary | ICD-10-CM | POA: Diagnosis not present

## 2022-02-20 DIAGNOSIS — R29898 Other symptoms and signs involving the musculoskeletal system: Secondary | ICD-10-CM

## 2022-02-20 NOTE — Therapy (Signed)
OUTPATIENT PHYSICAL THERAPY TREATMENT NOTE   Patient Name: Dana Hanson MRN: [id number] DOB:1960/10/05, 61 y.o., female Today's Date: 02/20/2022  PCP: [name] MD REFERRING PROVIDER: Carole Civil, MD   END OF SESSION:   PT End of Session - 02/20/22 1432     Visit Number 7    Number of Visits 8    Date for PT Re-Evaluation 03/01/22    Authorization Type Generic [employer]    PT Start Time 1432    PT Stop Time 1510    PT Time Calculation (min) 38 min    Activity Tolerance Patient tolerated treatment well    Behavior During Therapy WFL for tasks assessed/performed             Past Medical History:  Diagnosis Date   Cancer ([place])    kidney   Gallstone pancreatitis    Hyperlipidemia    IBS (irritable bowel syndrome)    patient denies   Right renal mass    Past Surgical History:  Procedure Laterality Date   CESAREAN SECTION     x 2-tubal with last c-section   CHOLECYSTECTOMY N/A 09/03/2015   Procedure: LAPAROSCOPIC CHOLECYSTECTOMY WITH INTRAOPERATIVE CHOLANGIOGRAM;  Surgeon: Mickeal Skinner, MD;  Location: [facility];  Service: General;  Laterality: N/A;   COLONOSCOPY  08/14/2011   Surgeon: Daneil Dolin, MD; external hemorrhoid tags, likely source of hematochezia, normal rectum, multiple colonic polyps at the base of the cecum, all less than 5 mm, single 4 mm sigmoid polyp, scattered diverticula.  Pathology revealed tubular adenoma in the cecum, hyperplastic polyp in the sigmoid colon.  Recommended repeat colonoscopy in 3 years.   COLONOSCOPY WITH PROPOFOL N/A 11/30/2021   Procedure: COLONOSCOPY WITH PROPOFOL;  Surgeon: Daneil Dolin, MD;  [facility];  Service: Endoscopy;  Laterality: N/A;  1:45pm   HEMORRHOID SURGERY  11/10/2011   Procedure: HEMORRHOIDECTOMY;  Surgeon: Donato Heinz, MD;  Location: [facility];  Service: General;  Laterality: N/A;   [place] RESECTION  01/15/2015   LAPAROSCOPIC NEPHRECTOMY N/A 09/03/2015    Procedure: LAPAROSCOPIC RADICAL NEPHRECTOMY;  Surgeon: Ardis Hughs, MD;  Location: [facility];  Service: Urology;  Laterality: N/A;   POLYPECTOMY  11/30/2021   Procedure: POLYPECTOMY;  Surgeon: Daneil Dolin, MD;  [facility];  Service: Endoscopy;;   TUBAL LIGATION     with last c-section   VERTEBROPLASTY N/A 09/09/2021   Procedure: Thoracic vertebral biopsy;  Surgeon: Ashok Pall, MD;  Location: [facility];  Service: Neurosurgery;  Laterality: N/A;  RM 21   Patient Active Problem List   Diagnosis Date Noted   Closed fracture of proximal end of left humerus with routine healing 01/25/2022   History of colonic polyps 10/12/2021   Abnormal PET scan of colon 10/12/2021   Metastatic renal cell carcinoma to bone ([place]) 09/27/2021   Cancer of right kidney ([place]) 09/03/2015   Renal mass 09/03/2015   Pancreatitis 08/21/2015   Biliary obstruction    Primary osteoarthritis of right knee 03/17/2014   Hemorrhoids 08/01/2011   Heme positive stool 08/01/2011   Rectal bleed 08/01/2011    REFERRING DIAG: PT eval/tx for proximal humerus   THERAPY DIAG:  Stiffness of left shoulder joint  Pain in joint of left shoulder  Other symptoms and signs involving the musculoskeletal system  Rationale for Evaluation and Treatment Rehabilitation  PERTINENT HISTORY: LT humrous fx 10/23/21   PRECAUTIONS: none  SUBJECTIVE: Patient reports shoulder is doing pretty good. Has  been taking care of mom so she hasn't been home much. She hasn't been able to exercise much. Still trouble reaching behind back and reaching high into cabinet. PAIN:  Are you having pain? No (no pain, just stiff)  OBJECTIVE:    DIAGNOSTIC FINDINGS:  NA   PATIENT SURVEYS:  FOTO 53% function   COGNITION:           Overall cognitive status: Within functional limits for tasks assessed                                  SENSATION: WFL   POSTURE: Forward shoulder    UPPER EXTREMITY ROM:    Active ROM Right eval  Left eval LT 02/09/22 Left 02/14/22 Left 02/20/22 Right 02/20/22  Shoulder flexion 130 115 130 125 140 following manual 139 142  Shoulder extension          Shoulder abduction 165 115 120 110 126 following manual 119 152  Shoulder adduction          Shoulder internal rotation T7 Ilium L4 PSIS L 5   Shoulder external rotation T3 T1      Elbow flexion          Elbow extension          Wrist flexion          Wrist extension          Wrist ulnar deviation          Wrist radial deviation          Wrist pronation          Wrist supination          (Blank rows = not tested)   UPPER EXTREMITY MMT:   MMT Right eval Left eval 02/20/22  Left  Shoulder flexion '5 5 5  '$ Shoulder extension       Shoulder abduction 5 4+ 4+  Shoulder adduction       Shoulder internal rotation '5 5 5  '$ Shoulder external rotation 5 4+ 4+  Middle trapezius       Lower trapezius       Elbow flexion       Elbow extension       Wrist flexion       Wrist extension       Wrist ulnar deviation       Wrist radial deviation       Wrist pronation       Wrist supination       Grip strength (lbs)       (Blank rows = not tested)     PALPATION:  Min/ Mod TTP about LT upper trap, levator, scapular border              TODAY'S TREATMENT:  02/20/22 Supine Grade II-III inferior glides of left GH joint with progressive L shoulder abduction Standing shoulder PNF D2 pattern RTB 2x 10  Standing shoulder horizontal abduction RTB 2x 10  Prone T(unilateral LUE) 1lb 2 x 10 Prone Y LUE 2x 10  UBE 2/2 lv 2  Thoracic extension over chair 10 x 5 second holds  02/16/22 LT GHJ inf/ post glides grade II-III LT GHJ PROM flexion/ abduction/ IR  Thoracic P/ A grade II-III   Band rows BTB 10 x 5" Shoulder extension BTB 10 x 5" Shoulder bilat ER RTB 10 x 5" Shoulder horiz abduction RTB 10 x 5" [employer]  on wall stabilization red med ball x20 CW/ CCW  UBE 2/2 lv 2     02/14/22 Supine Grade II-III inferior glides of left GH joint with  progressive L shoulder abduction Supine Grade II-III inferior glides of Left GH joint with progressive L shoulder flexion Shoulder flexion at wall with self STM release using tennis ball on posterior deltoid 3 min  Shoulder IR with strap 5x 10 - 20 second holds Supine PNF D2 pattern with RTB 2x 15  Standing shoulder horizontal abduction 2x 10 RTB   02/09/22 Shoulder adduction stretch 3 x 20" each Shoulder IR with strap 5 x 10" Shoulder flexion at wall with self STM release using tennis ball on posterior deltoid 3 min    Prone row 3lb 2 x 10 Prone YTI (unilateral LUE) 1lb 2 x 10 each     LT GHJ inf/ post glides grade II-III LT GHJ PROM flexion/ abduction/ IR  Thoracic P/ A grade II-III    Trigger Point Dry-Needling  Treatment instructions: Expect mild to moderate muscle soreness. S/S of pneumothorax if dry needled over a lung field, and to seek immediate medical attention should they occur. Patient verbalized understanding of these instructions and education.  Patient Consent Given: Yes Education handout provided: Yes Muscles treated: LT upper trap  Electrical stimulation performed: No Parameters: N/A Treatment response/outcome: good tolerance   Manual STM to LT upper trap, levator pre and post dry needling for trigger point identification and surface area preparation        PATIENT EDUCATION: Education details: 02/20/22 HEP, ROM and strength progress  Person educated: Patient Education method: Explanation Education comprehension: verbalized understanding     HOME EXERCISE PROGRAM: Access Code: [email or web address] URL: [email or web address] 02/14/22 - Standing Shoulder Single Arm PNF D2 Flexion with Resistance (Mirrored)  - 1 x daily - 7 x weekly - 3 sets - 10-15 reps - Standing Shoulder Horizontal Abduction with Resistance  - 1 x daily - 7 x weekly - 3 sets - 10 reps  Date: 02/02/2022 - Thoracic Extension Mobilization with Noodle  - 2-3 x daily - 7 x weekly - 2 sets -  10 reps - Standing Shoulder Posterior Capsule Stretch  - 2-3 x daily - 7 x weekly - 1 sets - 4 reps - 20-30 second hold - Single Arm Doorway Pec Stretch at 90 Degrees Abduction  - 2-3 x daily - 7 x weekly - 1 sets - 4 reps - 20-30 second hold   ASSESSMENT:   CLINICAL IMPRESSION: Patient maintaining ROM acquired in prior sessions. Tolerates manual well but no change in mobility following. Patient continues to show deficits in shoulder ROM with greatest limitation in abduction and IR and shoulder strength especially overhead. Continued with shoulder and periscapular strengthening. Patient will follow up next week for reassessment to determine extending POC vs. D/c. Patient will continue to benefit from physical therapy in order to improve function and reduce impairment.     OBJECTIVE IMPAIRMENTS decreased activity tolerance, decreased mobility, decreased ROM, decreased strength, increased fascial restrictions, impaired flexibility, impaired UE functional use, improper body mechanics, postural dysfunction, and pain.    ACTIVITY LIMITATIONS carrying, lifting, sleeping, and reach over head   PARTICIPATION LIMITATIONS: meal prep, cleaning, laundry, driving, shopping, community activity, occupation, and yard work   PERSONAL FACTORS  None  are also affecting patient's functional outcome.    REHAB POTENTIAL: Good   CLINICAL DECISION MAKING: Stable/uncomplicated   EVALUATION COMPLEXITY: Low     GOALS: SHORT TERM GOALS: Target date:  02/15/2022   Patient will be independent with initial HEP and self-management strategies to improve functional outcomes Baseline:  Goal status: INITIAL    LONG TERM GOALS: Target date: 03/01/2022   Patient will be independent with advanced HEP and self-management strategies to improve functional outcomes Baseline:  Goal status: INITIAL   2.  Patient will improve FOTO score to predicted value to indicate improvement in functional outcomes Baseline: 53% function   Goal status: INITIAL   3.  Patient will demo improved LT shoulder flexion and abduction to within 5 degrees of contralateral side in order to improve ability to perform OH ADLs such as dressing, grooming and cleaning.  Baseline: See AROM  Goal status: INITIAL   4. Patient will have improved functional LT shoulder IR to at least L1 level for improved ability to don pants/ belt with ease and less pain Baseline: See AROM  Goal status: INITIAL   PLAN: PT FREQUENCY: 1-2x/week   PT DURATION: 4 weeks   PLANNED INTERVENTIONS: Therapeutic exercises, Therapeutic activity, Neuromuscular re-education, Balance training, Gait training, Patient/Family education, Joint manipulation, Joint mobilization, Stair training, Aquatic Therapy, Dry Needling, Electrical stimulation, Spinal manipulation, Spinal mobilization, Cryotherapy, Moist heat, scar mobilization, Taping, Traction, Ultrasound, Biofeedback, Ionotophoresis '4mg'$ /ml Dexamethasone, and Manual therapy.     PLAN FOR NEXT SESSION: Progress shoulder stretch and scapular strengthening. GHJ AROM progressing as tolerated. Manual and DN as indicated   2:33 PM, 02/20/22 Mearl Latin PT, DPT Physical Therapist at [facility]

## 2022-02-23 ENCOUNTER — Encounter (HOSPITAL_COMMUNITY): Payer: 59 | Admitting: Physical Therapy

## 2022-02-24 ENCOUNTER — Other Ambulatory Visit: Payer: Self-pay | Admitting: Oncology

## 2022-02-24 DIAGNOSIS — C641 Malignant neoplasm of right kidney, except renal pelvis: Secondary | ICD-10-CM

## 2022-03-01 ENCOUNTER — Ambulatory Visit (HOSPITAL_COMMUNITY): Payer: 59 | Admitting: Physical Therapy

## 2022-03-01 ENCOUNTER — Encounter (HOSPITAL_COMMUNITY): Payer: Self-pay | Admitting: Physical Therapy

## 2022-03-01 DIAGNOSIS — M25612 Stiffness of left shoulder, not elsewhere classified: Secondary | ICD-10-CM

## 2022-03-01 DIAGNOSIS — R29898 Other symptoms and signs involving the musculoskeletal system: Secondary | ICD-10-CM

## 2022-03-01 DIAGNOSIS — M25512 Pain in left shoulder: Secondary | ICD-10-CM

## 2022-03-01 NOTE — Therapy (Signed)
OUTPATIENT PHYSICAL THERAPY TREATMENT NOTE   Patient Name: Dana Hanson MRN: [id number] DOB:1961/04/22, 61 y.o., female Today's Date: 03/01/2022  PCP: [name] MD REFERRING PROVIDER: Carole Civil, MD  PHYSICAL THERAPY DISCHARGE SUMMARY  Visits from Start of Care: 8  Current functional level related to goals / functional outcomes: See below   Remaining deficits: See below   Education / Equipment: See below   Patient agrees to discharge. Patient goals were partially met. Patient is being discharged due to meeting the stated rehab goals.    END OF SESSION:   PT End of Session - 03/01/22 1351     Visit Number 8    Number of Visits 8    Date for PT Re-Evaluation 03/01/22    Authorization Type Generic [employer]    PT Start Time 1351    PT Stop Time 1424    PT Time Calculation (min) 33 min    Activity Tolerance Patient tolerated treatment well    Behavior During Therapy WFL for tasks assessed/performed             Past Medical History:  Diagnosis Date   Cancer ([place])    kidney   Gallstone pancreatitis    Hyperlipidemia    IBS (irritable bowel syndrome)    patient denies   Right renal mass    Past Surgical History:  Procedure Laterality Date   CESAREAN SECTION     x 2-tubal with last c-section   CHOLECYSTECTOMY N/A 09/03/2015   Procedure: LAPAROSCOPIC CHOLECYSTECTOMY WITH INTRAOPERATIVE CHOLANGIOGRAM;  Surgeon: Arta Bruce Kinsinger, MD;  [facility];  Service: General;  Laterality: N/A;   COLONOSCOPY  08/14/2011   Surgeon: Daneil Dolin, MD; external hemorrhoid tags, likely source of hematochezia, normal rectum, multiple colonic polyps at the base of the cecum, all less than 5 mm, single 4 mm sigmoid polyp, scattered diverticula.  Pathology revealed tubular adenoma in the cecum, hyperplastic polyp in the sigmoid colon.  Recommended repeat colonoscopy in 3 years.   COLONOSCOPY WITH PROPOFOL N/A 11/30/2021   Procedure: COLONOSCOPY WITH PROPOFOL;   Surgeon: Daneil Dolin, MD;  [facility];  Service: Endoscopy;  Laterality: N/A;  1:45pm   HEMORRHOID SURGERY  11/10/2011   Procedure: HEMORRHOIDECTOMY;  Surgeon: Donato Heinz, MD;  [facility];  Service: General;  Laterality: N/A;   [place] RESECTION  01/15/2015   LAPAROSCOPIC NEPHRECTOMY N/A 09/03/2015   Procedure: LAPAROSCOPIC RADICAL NEPHRECTOMY;  Surgeon: Ardis Hughs, MD;  [facility];  Service: Urology;  Laterality: N/A;   POLYPECTOMY  11/30/2021   Procedure: POLYPECTOMY;  Surgeon: Daneil Dolin, MD;  [facility];  Service: Endoscopy;;   TUBAL LIGATION     with last c-section   VERTEBROPLASTY N/A 09/09/2021   Procedure: Thoracic vertebral biopsy;  Surgeon: Ashok Pall, MD;  Location: [facility];  Service: Neurosurgery;  Laterality: N/A;  RM 21   Patient Active Problem List   Diagnosis Date Noted   Closed fracture of proximal end of left humerus with routine healing 01/25/2022   History of colonic polyps 10/12/2021   Abnormal PET scan of colon 10/12/2021   Metastatic renal cell carcinoma to bone ([place]) 09/27/2021   Cancer of right kidney ([place]) 09/03/2015   Renal mass 09/03/2015   Pancreatitis 08/21/2015   Biliary obstruction    Primary osteoarthritis of right knee 03/17/2014   Hemorrhoids 08/01/2011   Heme positive stool 08/01/2011   Rectal bleed 08/01/2011    REFERRING  DIAG: PT eval/tx for proximal humerus   THERAPY DIAG:  Stiffness of left shoulder joint  Pain in joint of left shoulder  Other symptoms and signs involving the musculoskeletal system  Rationale for Evaluation and Treatment Rehabilitation  PERTINENT HISTORY: LT humrous fx 10/23/21   PRECAUTIONS: none  SUBJECTIVE: Patient reports she has been working on [employer]. She was getting her arm up really high yesterday. Tightness in bicep region.  Feels she can continue work on her own. Continues to work on stretches and exercises.  PAIN:  Are you having  pain? No (no pain, just stiff)  OBJECTIVE:    DIAGNOSTIC FINDINGS:  NA   PATIENT SURVEYS:  FOTO 53% function 8/16 79% function   COGNITION:           Overall cognitive status: Within functional limits for tasks assessed                                  SENSATION: WFL   POSTURE: Forward shoulder    UPPER EXTREMITY ROM:    Active ROM Right eval Left eval LT 02/09/22 Left 02/14/22 Left 02/20/22 Right 02/20/22 Left  03/01/22 Right  03/01/22  Shoulder flexion 130 115 130 125 140 following manual 139 142 149 153  Shoulder extension            Shoulder abduction 165 115 120 110 126 following manual 119 152 130   Shoulder adduction            Shoulder internal rotation T7 Ilium L4 PSIS L 5  L4   Shoulder external rotation T3 T1        Elbow flexion            Elbow extension            Wrist flexion            Wrist extension            Wrist ulnar deviation            Wrist radial deviation            Wrist pronation            Wrist supination            (Blank rows = not tested)   UPPER EXTREMITY MMT:   MMT Right eval Left eval 02/20/22  Left 03/01/22 Left  Shoulder flexion 5 5 5 5   Shoulder extension        Shoulder abduction 5 4+ 4+ 5  Shoulder adduction        Shoulder internal rotation 5 5 5 5   Shoulder external rotation 5 4+ 4+ 5  Middle trapezius        Lower trapezius        Elbow flexion        Elbow extension        Wrist flexion        Wrist extension        Wrist ulnar deviation        Wrist radial deviation        Wrist pronation        Wrist supination        Grip strength (lbs)        (Blank rows = not tested)     PALPATION:  Min/ Mod TTP about LT upper trap, levator, scapular border  TODAY'S TREATMENT:  03/01/22 Reassessment  Review of HEP   02/20/22 Supine Grade II-III inferior glides of left GH joint with progressive L shoulder abduction Standing shoulder PNF D2 pattern RTB 2x 10  Standing shoulder horizontal abduction  RTB 2x 10  Prone T(unilateral LUE) 1lb 2 x 10 Prone Y LUE 2x 10  UBE 2/2 lv 2  Thoracic extension over chair 10 x 5 second holds  02/16/22 LT GHJ inf/ post glides grade II-III LT GHJ PROM flexion/ abduction/ IR  Thoracic P/ A grade II-III   Band rows BTB 10 x 5" Shoulder extension BTB 10 x 5" Shoulder bilat ER RTB 10 x 5" Shoulder horiz abduction RTB 10 x 5" Ball on wall stabilization red med ball x20 CW/ CCW  UBE 2/2 lv 2     02/14/22 Supine Grade II-III inferior glides of left GH joint with progressive L shoulder abduction Supine Grade II-III inferior glides of Left GH joint with progressive L shoulder flexion Shoulder flexion at wall with self STM release using tennis ball on posterior deltoid 3 min  Shoulder IR with strap 5x 10 - 20 second holds Supine PNF D2 pattern with RTB 2x 15  Standing shoulder horizontal abduction 2x 10 RTB   02/09/22 Shoulder adduction stretch 3 x 20" each Shoulder IR with strap 5 x 10" Shoulder flexion at wall with self STM release using tennis ball on posterior deltoid 3 min    Prone row 3lb 2 x 10 Prone YTI (unilateral LUE) 1lb 2 x 10 each     LT GHJ inf/ post glides grade II-III LT GHJ PROM flexion/ abduction/ IR  Thoracic P/ A grade II-III    Trigger Point Dry-Needling  Treatment instructions: Expect mild to moderate muscle soreness. S/S of pneumothorax if dry needled over a lung field, and to seek immediate medical attention should they occur. Patient verbalized understanding of these instructions and education.  Patient Consent Given: Yes Education handout provided: Yes Muscles treated: LT upper trap  Electrical stimulation performed: No Parameters: N/A Treatment response/outcome: good tolerance   Manual STM to LT upper trap, levator pre and post dry needling for trigger point identification and surface area preparation        PATIENT EDUCATION: Education details: 8/16 HEP, returning to PT if needed; 02/20/22 HEP, ROM and strength  progress  Person educated: Patient Education method: Explanation Education comprehension: verbalized understanding     HOME EXERCISE PROGRAM: Access Code: [email or web address] URL: [email or web address] 02/14/22 - Standing Shoulder Single Arm PNF D2 Flexion with Resistance (Mirrored)  - 1 x daily - 7 x weekly - 3 sets - 10-15 reps - Standing Shoulder Horizontal Abduction with Resistance  - 1 x daily - 7 x weekly - 3 sets - 10 reps  Date: 02/02/2022 - Thoracic Extension Mobilization with Noodle  - 2-3 x daily - 7 x weekly - 2 sets - 10 reps - Standing Shoulder Posterior Capsule Stretch  - 2-3 x daily - 7 x weekly - 1 sets - 4 reps - 20-30 second hold - Single Arm Doorway Pec Stretch at 90 Degrees Abduction  - 2-3 x daily - 7 x weekly - 1 sets - 4 reps - 20-30 second hold   ASSESSMENT:   CLINICAL IMPRESSION: Patient has met 1/1 short term goals and 3/4 long term goals with ability to complete HEP and improvement in ROM, strength, activity tolerance and functional mobility. She remains limited by end range abduction and IR ROM. She also has difficulty with overhead strength.  Patient feels ready to transition to HEP and is educated on returning to PT if needed. Patient discharged from PT at this time.     OBJECTIVE IMPAIRMENTS decreased activity tolerance, decreased mobility, decreased ROM, decreased strength, increased fascial restrictions, impaired flexibility, impaired UE functional use, improper body mechanics, postural dysfunction, and pain.    ACTIVITY LIMITATIONS carrying, lifting, sleeping, and reach over head   PARTICIPATION LIMITATIONS: meal prep, cleaning, laundry, driving, shopping, community activity, occupation, and yard work   PERSONAL FACTORS  None  are also affecting patient's functional outcome.    REHAB POTENTIAL: Good   CLINICAL DECISION MAKING: Stable/uncomplicated   EVALUATION COMPLEXITY: Low     GOALS: SHORT TERM GOALS: Target date: 02/15/2022   Patient will  be independent with initial HEP and self-management strategies to improve functional outcomes Baseline:  Goal status: MET   LONG TERM GOALS: Target date: 03/01/2022   Patient will be independent with advanced HEP and self-management strategies to improve functional outcomes Baseline:  Goal status: MET 2.  Patient will improve FOTO score to predicted value to indicate improvement in functional outcomes Baseline: 53% function  8/16 79% function Goal status: MET   3.  Patient will demo improved LT shoulder flexion and abduction to within 5 degrees of contralateral side in order to improve ability to perform [place] ADLs such as dressing, grooming and cleaning.  Baseline: See AROM  Goal status: Partially MET   4. Patient will have improved functional LT shoulder IR to at least L1 level for improved ability to don pants/ belt with ease and less pain Baseline: See AROM  Goal status: Not met    PLAN: PT FREQUENCY: 1-2x/week   PT DURATION: 4 weeks   PLANNED INTERVENTIONS: Therapeutic exercises, Therapeutic activity, Neuromuscular re-education, Balance training, Gait training, Patient/Family education, Joint manipulation, Joint mobilization, Stair training, Aquatic Therapy, Dry Needling, Electrical stimulation, Spinal manipulation, Spinal mobilization, Cryotherapy, Moist heat, scar mobilization, Taping, Traction, Ultrasound, Biofeedback, Ionotophoresis 56m/ml Dexamethasone, and Manual therapy.     PLAN FOR NEXT SESSION: n/a  2:29 PM, 03/01/22 AMearl LatinPT, DPT [facility]

## 2022-03-03 ENCOUNTER — Telehealth: Payer: Self-pay | Admitting: *Deleted

## 2022-03-03 ENCOUNTER — Encounter: Payer: Self-pay | Admitting: *Deleted

## 2022-03-03 ENCOUNTER — Inpatient Hospital Stay (HOSPITAL_BASED_OUTPATIENT_CLINIC_OR_DEPARTMENT_OTHER): Payer: 59 | Admitting: Oncology

## 2022-03-03 ENCOUNTER — Other Ambulatory Visit: Payer: Self-pay

## 2022-03-03 ENCOUNTER — Encounter (HOSPITAL_COMMUNITY): Payer: 59 | Admitting: Physical Therapy

## 2022-03-03 ENCOUNTER — Inpatient Hospital Stay: Payer: 59 | Attending: Oncology

## 2022-03-03 ENCOUNTER — Inpatient Hospital Stay: Payer: 59

## 2022-03-03 VITALS — BP 135/84 | HR 67 | Temp 97.2°F | Resp 15 | Wt 175.3 lb

## 2022-03-03 VITALS — BP 111/70 | HR 64 | Temp 98.3°F | Resp 16

## 2022-03-03 DIAGNOSIS — Z5112 Encounter for antineoplastic immunotherapy: Secondary | ICD-10-CM | POA: Insufficient documentation

## 2022-03-03 DIAGNOSIS — Z79899 Other long term (current) drug therapy: Secondary | ICD-10-CM | POA: Diagnosis not present

## 2022-03-03 DIAGNOSIS — C641 Malignant neoplasm of right kidney, except renal pelvis: Secondary | ICD-10-CM | POA: Insufficient documentation

## 2022-03-03 DIAGNOSIS — C7951 Secondary malignant neoplasm of bone: Secondary | ICD-10-CM | POA: Diagnosis not present

## 2022-03-03 LAB — CMP (CANCER CENTER ONLY)
ALT: 12 U/L (ref 0–44)
AST: 24 U/L (ref 15–41)
Albumin: 4.4 g/dL (ref 3.5–5.0)
Alkaline Phosphatase: 63 U/L (ref 38–126)
Anion gap: 5 (ref 5–15)
BUN: 24 mg/dL — ABNORMAL HIGH (ref 6–20)
CO2: 29 mmol/L (ref 22–32)
Calcium: 9.9 mg/dL (ref 8.9–10.3)
Chloride: 108 mmol/L (ref 98–111)
Creatinine: 1.35 mg/dL — ABNORMAL HIGH (ref 0.44–1.00)
GFR, Estimated: 45 mL/min — ABNORMAL LOW (ref >60)
Glucose, Bld: 89 mg/dL (ref 70–99)
Potassium: 3.8 mmol/L (ref 3.5–5.1)
Sodium: 142 mmol/L (ref 135–145)
Total Bilirubin: 0.6 mg/dL (ref 0.3–1.2)
Total Protein: 7 g/dL (ref 6.5–8.1)

## 2022-03-03 LAB — CBC WITH DIFFERENTIAL (CANCER CENTER ONLY)
Abs Immature Granulocytes: 0.01 10*3/uL (ref 0.00–0.07)
Basophils Absolute: 0.1 10*3/uL (ref 0.0–0.1)
Basophils Relative: 1 %
Eosinophils Absolute: 0.2 10*3/uL (ref 0.0–0.5)
Eosinophils Relative: 3 %
HCT: 35.7 % — ABNORMAL LOW (ref 36.0–46.0)
Hemoglobin: 11.9 g/dL — ABNORMAL LOW (ref 12.0–15.0)
Immature Granulocytes: 0 %
Lymphocytes Relative: 24 %
Lymphs Abs: 1.3 10*3/uL (ref 0.7–4.0)
MCH: 28.9 pg (ref 26.0–34.0)
MCHC: 33.3 g/dL (ref 30.0–36.0)
MCV: 86.7 fL (ref 80.0–100.0)
Monocytes Absolute: 0.4 10*3/uL (ref 0.1–1.0)
Monocytes Relative: 7 %
Neutro Abs: 3.5 10*3/uL (ref 1.7–7.7)
Neutrophils Relative %: 65 %
Platelet Count: 217 10*3/uL (ref 150–400)
RBC: 4.12 MIL/uL (ref 3.87–5.11)
RDW: 14.1 % (ref 11.5–15.5)
WBC Count: 5.4 10*3/uL (ref 4.0–10.5)
nRBC: 0 % (ref 0.0–0.2)

## 2022-03-03 LAB — TSH: TSH: 48.518 u[IU]/mL — ABNORMAL HIGH (ref 0.350–4.500)

## 2022-03-03 MED ORDER — LEVOTHYROXINE SODIUM 50 MCG PO TABS: 50.0000 ug | ORAL_TABLET | Freq: Every day | ORAL | 1 refills | Status: DC

## 2022-03-03 MED ORDER — SODIUM CHLORIDE 0.9 % IV SOLN
400.0000 mg | Freq: Once | INTRAVENOUS | Status: AC
  Administered 2022-03-03: 400 mg via INTRAVENOUS
  Filled 2022-03-03: qty 16

## 2022-03-03 MED ORDER — SODIUM CHLORIDE 0.9 % IV SOLN: Freq: Once | INTRAVENOUS | Status: AC

## 2022-03-03 NOTE — Addendum Note (Signed)
Addended by: Wyatt Portela on: 03/03/2022 02:52 PM   Modules accepted: Orders

## 2022-03-03 NOTE — Progress Notes (Signed)
Hematology and Oncology Follow Up Visit  Dana Hanson [id number] 1960/12/07 61 y.o. 03/03/2022 11:34 AM Dana Hanson, MDFagan, [name], MD   Principle Diagnosis: 58 year old woman with kidney cancer diagnosed in February 2017.  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   Prior Therapy:  She is status post laparoscopic right radical nephrectomy performed in February 2017.  She was found to have 6.5 cm clear-cell renal cell carcinoma.  She is status post surgical biopsy completed by Dr. Christella Noa on September 09, 2021 of the T6 spinal lesion which confirmed the presence of metastatic renal cell carcinoma.  She is status post spinal stereotactic radiosurgery completed on September 27, 2021 for isolated metastatic lesion of the T6 spine.   Current therapy: Pembrolizumab 200 mg every 3 weeks cycle 1 started on Nov 18, 2021.  She received 400 mg starting with cycle 4 on January 20, 2022.  She returns for the subsequent cycle of therapy  Interim History: Dana Hanson is here for a follow-up visit.  Since last visit, she received 400 mg of Keytruda without any major complications.  She did start on Synthroid replacement and 25 mcg which has been well-tolerated but has issues with fatigue and sleepiness.  She denies any pruritus, shortness of breath or difficulty breathing.  Her weight has been fluctuating related to the thyroid medication and treatment and has been difficult to regulate.     Medications: Reviewed without changes. Current Outpatient Medications  Medication Sig Dispense Refill   acetaminophen (TYLENOL) 325 MG tablet Take 2 tablets (650 mg total) by mouth every 6 (six) hours as needed for moderate pain. 30 tablet 0   levothyroxine (SYNTHROID) 25 MCG tablet Take 1 tablet (25 mcg total) by mouth daily before breakfast. 60 tablet 3   Multiple Vitamin (MULITIVITAMIN WITH MINERALS) TABS Take 1  tablet by mouth daily.     Pembrolizumab (KEYTRUDA IV) Inject into the vein.     polyethylene glycol-electrolytes (TRILYTE) 420 g solution Take 4,000 mLs by mouth as directed. 4000 mL 0   pregabalin (LYRICA) 50 MG capsule Take 50 mg by mouth 3 (three) times daily.     No current facility-administered medications for this visit.     Allergies: No Known Allergies    Physical Exam:  Blood pressure 135/84, pulse 67, temperature (!) 97.2 F (36.2 C), temperature source Temporal, resp. rate 15, weight 175 lb 4.8 oz (79.5 kg), last menstrual period 10/27/2013, SpO2 98 %.    ECOG: 1     General appearance: Alert, awake without any distress. Head: Atraumatic without abnormalities Oropharynx: Without any thrush or ulcers. Eyes: No scleral icterus. Lymph nodes: No lymphadenopathy noted in the cervical, supraclavicular, or axillary nodes Heart:regular rate and rhythm, without any murmurs or gallops.   Lung: Clear to auscultation without any rhonchi, wheezes or dullness to percussion. Abdomin: Soft, nontender without any shifting dullness or ascites. Musculoskeletal: No clubbing or cyanosis. Neurological: No motor or sensory deficits. Skin: No rashes or lesions.           Lab Results: Lab Results  Component Value Date   WBC 6.4 01/20/2022   HGB 12.9 01/20/2022   HCT 39.3 01/20/2022   MCV 87.5 01/20/2022   PLT 237 01/20/2022     Chemistry      Component Value Date/Time   NA 141 01/20/2022 1235   K 4.7 01/20/2022 1235   CL 106 01/20/2022 1235  CO2 30 01/20/2022 1235   BUN 16 01/20/2022 1235   CREATININE 1.33 (H) 01/20/2022 1235      Component Value Date/Time   CALCIUM 9.8 01/20/2022 1235   ALKPHOS 60 01/20/2022 1235   AST 38 01/20/2022 1235   ALT 16 01/20/2022 1235   BILITOT 0.6 01/20/2022 1235          Impression and Plan:  56 year old with:  1.  Kidney cancer diagnosed in 2017.  She developed stage IV clear-cell with isolated spinal metastasis  diagnosed in January 2023.  She continues to receive Pembrolizumab 400 mg every 6 weeks without any complications.  Risks and benefits of continuing this treatment were discussed at this time.  I recommended continuing the same dose and schedule and we will update her staging scans in September 2023.  2.  Thoracic spine metastasis: She is scheduled to have a repeat MRI in December 2023.  Imaging studies in June did not show any evidence of relapsed disease.  3.  Autoimmune complications: She has not experienced any complications at this time.  These include pneumonitis, colitis and hypophysitis.  Her TSH is elevated and will monitor and treat.  4.  Hypothyroidism: Her thyroid panel will be repeated today and will make adjustments to her thyroid accordingly.   5.  Follow-up: In 6 weeks for repeat follow-up.  30  minutes were spent on this encounter.  The time was dedicated to reviewing laboratory data, disease status update, treatment choices and addressing complications related to cancer and cancer therapy.   Dana Button, MD 8/18/202311:34 AM

## 2022-03-03 NOTE — Telephone Encounter (Signed)
-----   Message from Wyatt Portela, MD sent at 03/03/2022  2:51 PM EDT ----- Please let her know to increase synthroid to 50. This will help improve her symptoms

## 2022-03-03 NOTE — Patient Instructions (Signed)
[facility]  Discharge Instructions: Thank you for choosing [facility] to provide your oncology and hematology care.   If you have a lab appointment with the Cancer Center, please go directly to the Cancer Center and check in at the registration area.   Wear comfortable clothing and clothing appropriate for easy access to any Portacath or PICC line.   We strive to give you quality time with your provider. You may need to reschedule your appointment if you arrive late (15 or more minutes).  Arriving late affects you and other patients whose appointments are after yours.  Also, if you miss three or more appointments without notifying the office, you may be dismissed from the clinic at the provider's discretion.      For prescription refill requests, have your pharmacy contact our office and allow 72 hours for refills to be completed.    Today you received the following chemotherapy and/or immunotherapy agents: Keytruda      To help prevent nausea and vomiting after your treatment, we encourage you to take your nausea medication as directed.  BELOW ARE SYMPTOMS THAT SHOULD BE REPORTED IMMEDIATELY: *FEVER GREATER THAN 100.4 F (38 C) OR HIGHER *CHILLS OR SWEATING *NAUSEA AND VOMITING THAT IS NOT CONTROLLED WITH YOUR NAUSEA MEDICATION *UNUSUAL SHORTNESS OF BREATH *UNUSUAL BRUISING OR BLEEDING *URINARY PROBLEMS (pain or burning when urinating, or frequent urination) *BOWEL PROBLEMS (unusual diarrhea, constipation, pain near the anus) TENDERNESS IN MOUTH AND THROAT WITH OR WITHOUT PRESENCE OF ULCERS (sore throat, sores in mouth, or a toothache) UNUSUAL RASH, SWELLING OR PAIN  UNUSUAL VAGINAL DISCHARGE OR ITCHING   Items with * indicate a potential emergency and should be followed up as soon as possible or go to the Emergency Department if any problems should occur.  Please show the CHEMOTHERAPY ALERT CARD or IMMUNOTHERAPY ALERT CARD at check-in to  the Emergency Department and triage nurse.  Should you have questions after your visit or need to cancel or reschedule your appointment, please contact [facility]  Dept: [phone]  and follow the prompts.  Office hours are 8:00 a.m. to 4:30 p.m. Monday - Friday. Please note that voicemails left after 4:00 p.m. may not be returned until the following business day.  We are closed weekends and major holidays. You have access to a nurse at all times for urgent questions. Please call the main number to the clinic Dept: [phone] and follow the prompts.   For any non-urgent questions, you may also contact your provider using MyChart. We now offer e-Visits for anyone 18 and older to request care online for non-urgent symptoms. For details visit [email or web address].   Also download the MyChart app! Go to the app store, search "MyChart", open the app, select [facility], and log in with your MyChart username and password.  Masks are optional in the cancer centers. If you would like for your care team to wear a mask while they are taking care of you, please let them know. For doctor visits, patients may have with them one support person who is at least 60 years old. At this time, visitors are not allowed in the infusion area. 

## 2022-03-03 NOTE — Telephone Encounter (Signed)
Notified of message below. Verbalized understanding 

## 2022-03-04 LAB — T4: T4, Total: 2.9 ug/dL — ABNORMAL LOW (ref 4.5–12.0)

## 2022-03-06 ENCOUNTER — Encounter: Payer: Self-pay | Admitting: Oncology

## 2022-03-06 NOTE — Progress Notes (Signed)
  Radiation Oncology         [phone] ________________________________  Name: Dana Hanson MRN: [id number]  Date: 10/04/2021  DOB: 27-Sep-1960  End of Treatment Note  Diagnosis:   61 yo woman with isolated T6 oligometastasis from right renal cell carcinoma     Indication for treatment:  Curative       Radiation treatment dates:   10/04/21  Site/dose/Beams/energy:   The targeted metastasis in the T6 vertebral body was treated using 3 Rapid Arc VMAT Beams to a prescription dose of 18 Gy to the gross disease (GTV) seen on imaging, while a secondary lower prescription dose of 15 Gy was delivered to the entirety of each directly involved marrow compartment of the gross disease (CTV).    The beams were delivered with 6 MV X-rays in the flattening filter free mode.  Narrative: The patient tolerated radiation treatment relatively well.     Plan: The patient has completed radiation treatment. The patient will return to radiation oncology clinic for routine followup in one month. I advised her to call or return sooner if she has any questions or concerns related to her recovery or treatment. ________________________________  Sheral Apley. Tammi Klippel, M.D.

## 2022-03-30 ENCOUNTER — Ambulatory Visit (HOSPITAL_COMMUNITY)
Admission: RE | Admit: 2022-03-30 | Discharge: 2022-03-30 | Disposition: A | Discharge status: Home / Self Care | Payer: 59 | Source: Ambulatory Visit | Attending: Oncology | Admitting: Oncology

## 2022-03-30 DIAGNOSIS — C641 Malignant neoplasm of right kidney, except renal pelvis: Secondary | ICD-10-CM | POA: Insufficient documentation

## 2022-03-30 MED ORDER — FLUDEOXYGLUCOSE F - 18 (FDG) INJECTION
8.7300 | Freq: Once | INTRAVENOUS | Status: AC | PRN
  Administered 2022-03-30: 8.73 via INTRAVENOUS

## 2022-04-14 ENCOUNTER — Inpatient Hospital Stay: Payer: 59 | Attending: Oncology

## 2022-04-14 ENCOUNTER — Inpatient Hospital Stay: Payer: 59

## 2022-04-14 ENCOUNTER — Inpatient Hospital Stay (HOSPITAL_BASED_OUTPATIENT_CLINIC_OR_DEPARTMENT_OTHER): Payer: 59 | Admitting: Oncology

## 2022-04-14 VITALS — BP 120/81 | HR 75 | Temp 97.2°F | Resp 16 | Wt 174.7 lb

## 2022-04-14 DIAGNOSIS — C641 Malignant neoplasm of right kidney, except renal pelvis: Secondary | ICD-10-CM

## 2022-04-14 DIAGNOSIS — Z923 Personal history of irradiation: Secondary | ICD-10-CM | POA: Insufficient documentation

## 2022-04-14 DIAGNOSIS — Z5112 Encounter for antineoplastic immunotherapy: Secondary | ICD-10-CM | POA: Insufficient documentation

## 2022-04-14 DIAGNOSIS — C7951 Secondary malignant neoplasm of bone: Secondary | ICD-10-CM | POA: Diagnosis not present

## 2022-04-14 DIAGNOSIS — K5730 Diverticulosis of large intestine without perforation or abscess without bleeding: Secondary | ICD-10-CM | POA: Insufficient documentation

## 2022-04-14 DIAGNOSIS — Z79899 Other long term (current) drug therapy: Secondary | ICD-10-CM | POA: Insufficient documentation

## 2022-04-14 DIAGNOSIS — E039 Hypothyroidism, unspecified: Secondary | ICD-10-CM | POA: Diagnosis not present

## 2022-04-14 LAB — CMP (CANCER CENTER ONLY)
ALT: 7 U/L (ref 0–44)
AST: 15 U/L (ref 15–41)
Albumin: 4.3 g/dL (ref 3.5–5.0)
Alkaline Phosphatase: 56 U/L (ref 38–126)
Anion gap: 4 — ABNORMAL LOW (ref 5–15)
BUN: 25 mg/dL — ABNORMAL HIGH (ref 6–20)
CO2: 28 mmol/L (ref 22–32)
Calcium: 9.7 mg/dL (ref 8.9–10.3)
Chloride: 109 mmol/L (ref 98–111)
Creatinine: 1.19 mg/dL — ABNORMAL HIGH (ref 0.44–1.00)
GFR, Estimated: 53 mL/min — ABNORMAL LOW (ref >60)
Glucose, Bld: 113 mg/dL — ABNORMAL HIGH (ref 70–99)
Potassium: 3.9 mmol/L (ref 3.5–5.1)
Sodium: 141 mmol/L (ref 135–145)
Total Bilirubin: 0.6 mg/dL (ref 0.3–1.2)
Total Protein: 7.3 g/dL (ref 6.5–8.1)

## 2022-04-14 LAB — CBC WITH DIFFERENTIAL (CANCER CENTER ONLY)
Abs Immature Granulocytes: 0.01 10*3/uL (ref 0.00–0.07)
Basophils Absolute: 0.1 10*3/uL (ref 0.0–0.1)
Basophils Relative: 1 %
Eosinophils Absolute: 0.1 10*3/uL (ref 0.0–0.5)
Eosinophils Relative: 2 %
HCT: 38.2 % (ref 36.0–46.0)
Hemoglobin: 12.7 g/dL (ref 12.0–15.0)
Immature Granulocytes: 0 %
Lymphocytes Relative: 19 %
Lymphs Abs: 1.1 10*3/uL (ref 0.7–4.0)
MCH: 29.5 pg (ref 26.0–34.0)
MCHC: 33.2 g/dL (ref 30.0–36.0)
MCV: 88.8 fL (ref 80.0–100.0)
Monocytes Absolute: 0.3 10*3/uL (ref 0.1–1.0)
Monocytes Relative: 6 %
Neutro Abs: 4 10*3/uL (ref 1.7–7.7)
Neutrophils Relative %: 72 %
Platelet Count: 214 10*3/uL (ref 150–400)
RBC: 4.3 MIL/uL (ref 3.87–5.11)
RDW: 14.8 % (ref 11.5–15.5)
WBC Count: 5.5 10*3/uL (ref 4.0–10.5)
nRBC: 0 % (ref 0.0–0.2)

## 2022-04-14 LAB — TSH: TSH: 21.769 u[IU]/mL — ABNORMAL HIGH (ref 0.350–4.500)

## 2022-04-14 MED ORDER — SODIUM CHLORIDE 0.9 % IV SOLN: Freq: Once | INTRAVENOUS | Status: AC

## 2022-04-14 MED ORDER — SODIUM CHLORIDE 0.9 % IV SOLN
400.0000 mg | Freq: Once | INTRAVENOUS | Status: AC
  Administered 2022-04-14: 400 mg via INTRAVENOUS
  Filled 2022-04-14: qty 16

## 2022-04-14 NOTE — Patient Instructions (Signed)
[facility]  Discharge Instructions: Thank you for choosing [facility] to provide your oncology and hematology care.   If you have a lab appointment with the Cancer Center, please go directly to the Cancer Center and check in at the registration area.   Wear comfortable clothing and clothing appropriate for easy access to any Portacath or PICC line.   We strive to give you quality time with your provider. You may need to reschedule your appointment if you arrive late (15 or more minutes).  Arriving late affects you and other patients whose appointments are after yours.  Also, if you miss three or more appointments without notifying the office, you may be dismissed from the clinic at the provider's discretion.      For prescription refill requests, have your pharmacy contact our office and allow 72 hours for refills to be completed.    Today you received the following chemotherapy and/or immunotherapy agents: Keytruda      To help prevent nausea and vomiting after your treatment, we encourage you to take your nausea medication as directed.  BELOW ARE SYMPTOMS THAT SHOULD BE REPORTED IMMEDIATELY: *FEVER GREATER THAN 100.4 F (38 C) OR HIGHER *CHILLS OR SWEATING *NAUSEA AND VOMITING THAT IS NOT CONTROLLED WITH YOUR NAUSEA MEDICATION *UNUSUAL SHORTNESS OF BREATH *UNUSUAL BRUISING OR BLEEDING *URINARY PROBLEMS (pain or burning when urinating, or frequent urination) *BOWEL PROBLEMS (unusual diarrhea, constipation, pain near the anus) TENDERNESS IN MOUTH AND THROAT WITH OR WITHOUT PRESENCE OF ULCERS (sore throat, sores in mouth, or a toothache) UNUSUAL RASH, SWELLING OR PAIN  UNUSUAL VAGINAL DISCHARGE OR ITCHING   Items with * indicate a potential emergency and should be followed up as soon as possible or go to the Emergency Department if any problems should occur.  Please show the CHEMOTHERAPY ALERT CARD or IMMUNOTHERAPY ALERT CARD at check-in to  the Emergency Department and triage nurse.  Should you have questions after your visit or need to cancel or reschedule your appointment, please contact [facility]  Dept: [phone]  and follow the prompts.  Office hours are 8:00 a.m. to 4:30 p.m. Monday - Friday. Please note that voicemails left after 4:00 p.m. may not be returned until the following business day.  We are closed weekends and major holidays. You have access to a nurse at all times for urgent questions. Please call the main number to the clinic Dept: [phone] and follow the prompts.   For any non-urgent questions, you may also contact your provider using MyChart. We now offer e-Visits for anyone 18 and older to request care online for non-urgent symptoms. For details visit [email or web address].   Also download the MyChart app! Go to the app store, search "MyChart", open the app, select , and log in with your MyChart username and password.  Masks are optional in the cancer centers. If you would like for your care team to wear a mask while they are taking care of you, please let them know. For doctor visits, patients may have with them one support person who is at least 60 years old. At this time, visitors are not allowed in the infusion area. 

## 2022-04-14 NOTE — Progress Notes (Signed)
Hematology and Oncology Follow Up Visit  Dana Hanson [id number] 05-04-1961 61 y.o. 04/14/2022 11:46 AM Dana [facility], MDFagan, Dana Manner, MD   Principle Diagnosis: 61 year old woman with stage IV clear-cell renal cell carcinoma without any active disease documented in January 2023.  She was found to have isolated area of metastasis at T6 after initially diagnosed in 2017 with localized disease.  Prior Therapy:  She is status post laparoscopic right radical nephrectomy performed in February 2017.  She was found to have 6.5 cm clear-cell renal cell carcinoma.  She is status post surgical biopsy completed by Dr. Christella Noa on September 09, 2021 of the T6 spinal lesion which confirmed the presence of metastatic renal cell carcinoma.  She is status post spinal stereotactic radiosurgery completed on September 27, 2021 for isolated metastatic lesion of the T6 spine.   Current therapy: Pembrolizumab 200 mg every 3 weeks cycle 1 started on Nov 18, 2021.  She received 400 mg starting with cycle 4 on January 20, 2022.  She she is here for the next cycle of therapy.  Interim History: Dana Hanson returns today for follow-up.  Since the last visit, she reports no major changes in her health.  She denies any recent hospitalizations or illnesses.  She denies any nausea, vomiting or abdominal pain.  She has reported some occasional fatigue associated with Pembrolizumab.  She is currently on Synthroid replacement without any issues.     Medications: Updated on review. Current Outpatient Medications  Medication Sig Dispense Refill   acetaminophen (TYLENOL) 325 MG tablet Take 2 tablets (650 mg total) by mouth every 6 (six) hours as needed for moderate pain. 30 tablet 0   levothyroxine (SYNTHROID) 50 MCG tablet Take 1 tablet (50 mcg total) by mouth daily before breakfast. 90 tablet 1   Multiple Vitamin (MULITIVITAMIN WITH MINERALS) TABS Take 1 tablet by mouth daily.     Pembrolizumab (KEYTRUDA IV) Inject into the vein.      pregabalin (LYRICA) 50 MG capsule Take 50 mg by mouth 3 (three) times daily.     No current facility-administered medications for this visit.     Allergies: No Known Allergies    Physical Exam:  Blood pressure 120/81, pulse 75, temperature (!) 97.2 F (36.2 C), resp. rate 16, weight 174 lb 11.2 oz (79.2 kg), last menstrual period 10/27/2013, SpO2 99 %.     ECOG: 1    General appearance: Comfortable appearing without any discomfort Head: Normocephalic without any trauma Oropharynx: Mucous membranes are moist and pink without any thrush or ulcers. Eyes: Pupils are equal and round reactive to light. Lymph nodes: No cervical, supraclavicular, inguinal or axillary lymphadenopathy.   Heart:regular rate and rhythm.  S1 and S2 without leg edema. Lung: Clear without any rhonchi or wheezes.  No dullness to percussion. Abdomin: Soft, nontender, nondistended with good bowel sounds.  No hepatosplenomegaly. Musculoskeletal: No joint deformity or effusion.  Full range of motion noted. Neurological: No deficits noted on motor, sensory and deep tendon reflex exam. Skin: No petechial rash or dryness.  Appeared moist.             Lab Results: Lab Results  Component Value Date   WBC 5.4 03/03/2022   HGB 11.9 (L) 03/03/2022   HCT 35.7 (L) 03/03/2022   MCV 86.7 03/03/2022   PLT 217 03/03/2022     Chemistry      Component Value Date/Time   NA 142 03/03/2022 1144   K 3.8 03/03/2022 1144   CL 108 03/03/2022 1144  CO2 29 03/03/2022 1144   BUN 24 (H) 03/03/2022 1144   CREATININE 1.35 (H) 03/03/2022 1144      Component Value Date/Time   CALCIUM 9.9 03/03/2022 1144   ALKPHOS 63 03/03/2022 1144   AST 24 03/03/2022 1144   ALT 12 03/03/2022 1144   BILITOT 0.6 03/03/2022 1144      IMPRESSION: 1. Stable to slight decrease in hypermetabolism associated with the patient's known pathologic fracture at T6. 2. No other findings on today's study to suggest definite hypermetabolic  metastatic disease. 3. New foci of low level hypermetabolism identified in the proximal stomach near the GE junction and in the gastric antrum. No underlying mass lesion by CT imaging. Features are nonspecific but infection/inflammation would be a consideration. Attention on follow-up recommended. Upper endoscopy may prove helpful to further evaluate as clinically warranted. 4. Interval resolution of hypermetabolism associated with the anterior right ninth rib healing fracture seen previously. 5. Left colonic diverticulosis without diverticulitis. 6.  Aortic Atherosclerois (ICD10-170.0)    Impression and Plan:  61 year old with:  1.  Stage IV clear-cell renal cell carcinoma with isolated T6 spinal metastasis diagnosed in January 2023.  She has no active disease at this time.  Risks and benefits of continuing Pembrolizumab adjuvantly which is given for her stage IV NED disease were discussed.  Complications that include nausea, fatigue as well as autoimmune issues were reiterated.  Plan is to complete 1 year of therapy which will conclude around March 2024.  PET scan obtained on the March 30, 2022 continues to show no evidence of disease at this time.  This will be repeated in 6 months.  2.  Thoracic spine metastasis: She is status post radiation therapy and follow-up MRI scheduled for her.  3.  Autoimmune complications: I continue to educate about these complications occluding pneumonitis, colitis and thyroid disease.  4.  Hypothyroidism:.  Her TSH is declining and her T4 remains low.  We will adjust her thyroid replacement accordingly.  5.  GE junction abnormalities: Likely inflammatory changes and endoscopy could be considered in the future.   6.  Follow-up: She will return in 6 weeks for follow-up.  30  minutes were dedicated to this visit.  The time was spent on reviewing laboratory data, disease status update and outlining future plan of care review.   Dana Button,  MD 9/29/202311:46 AM

## 2022-04-15 LAB — T4: T4, Total: 6.3 ug/dL (ref 4.5–12.0)

## 2022-04-19 ENCOUNTER — Ambulatory Visit: Payer: 59

## 2022-04-19 ENCOUNTER — Ambulatory Visit: Payer: 59 | Admitting: Oncology

## 2022-04-19 ENCOUNTER — Other Ambulatory Visit: Payer: 59

## 2022-05-03 ENCOUNTER — Telehealth: Payer: Self-pay | Admitting: Oncology

## 2022-05-03 NOTE — Telephone Encounter (Signed)
Called patient regarding upcoming November and December appointments, patient is notified. 

## 2022-05-04 ENCOUNTER — Other Ambulatory Visit: Payer: Self-pay

## 2022-05-16 ENCOUNTER — Other Ambulatory Visit: Payer: Self-pay

## 2022-05-23 ENCOUNTER — Other Ambulatory Visit: Payer: Self-pay

## 2022-05-30 ENCOUNTER — Other Ambulatory Visit: Payer: Self-pay

## 2022-05-31 ENCOUNTER — Encounter (INDEPENDENT_AMBULATORY_CARE_PROVIDER_SITE_OTHER): Payer: 59 | Admitting: Ophthalmology

## 2022-05-31 DIAGNOSIS — H43813 Vitreous degeneration, bilateral: Secondary | ICD-10-CM

## 2022-05-31 DIAGNOSIS — H35372 Puckering of macula, left eye: Secondary | ICD-10-CM | POA: Diagnosis not present

## 2022-06-01 ENCOUNTER — Other Ambulatory Visit: Payer: Self-pay

## 2022-06-02 ENCOUNTER — Inpatient Hospital Stay: Payer: Managed Care, Other (non HMO) | Attending: Oncology

## 2022-06-02 ENCOUNTER — Inpatient Hospital Stay (HOSPITAL_BASED_OUTPATIENT_CLINIC_OR_DEPARTMENT_OTHER): Payer: Managed Care, Other (non HMO) | Admitting: Oncology

## 2022-06-02 ENCOUNTER — Inpatient Hospital Stay: Payer: Managed Care, Other (non HMO)

## 2022-06-02 VITALS — BP 104/62 | HR 81 | Resp 18

## 2022-06-02 VITALS — BP 121/83 | HR 74 | Temp 97.8°F | Resp 16 | Wt 175.6 lb

## 2022-06-02 DIAGNOSIS — E039 Hypothyroidism, unspecified: Secondary | ICD-10-CM | POA: Diagnosis not present

## 2022-06-02 DIAGNOSIS — C641 Malignant neoplasm of right kidney, except renal pelvis: Secondary | ICD-10-CM

## 2022-06-02 DIAGNOSIS — Z5112 Encounter for antineoplastic immunotherapy: Secondary | ICD-10-CM | POA: Insufficient documentation

## 2022-06-02 DIAGNOSIS — C7951 Secondary malignant neoplasm of bone: Secondary | ICD-10-CM | POA: Diagnosis not present

## 2022-06-02 DIAGNOSIS — Z79899 Other long term (current) drug therapy: Secondary | ICD-10-CM | POA: Insufficient documentation

## 2022-06-02 LAB — CMP (CANCER CENTER ONLY)
ALT: 7 U/L (ref 0–44)
AST: 16 U/L (ref 15–41)
Albumin: 4.5 g/dL (ref 3.5–5.0)
Alkaline Phosphatase: 54 U/L (ref 38–126)
Anion gap: 5 (ref 5–15)
BUN: 23 mg/dL — ABNORMAL HIGH (ref 6–20)
CO2: 29 mmol/L (ref 22–32)
Calcium: 9.8 mg/dL (ref 8.9–10.3)
Chloride: 109 mmol/L (ref 98–111)
Creatinine: 1.23 mg/dL — ABNORMAL HIGH (ref 0.44–1.00)
GFR, Estimated: 51 mL/min — ABNORMAL LOW (ref >60)
Glucose, Bld: 84 mg/dL (ref 70–99)
Potassium: 4.1 mmol/L (ref 3.5–5.1)
Sodium: 143 mmol/L (ref 135–145)
Total Bilirubin: 0.7 mg/dL (ref 0.3–1.2)
Total Protein: 7.5 g/dL (ref 6.5–8.1)

## 2022-06-02 LAB — CBC WITH DIFFERENTIAL (CANCER CENTER ONLY)
Abs Immature Granulocytes: 0.01 10*3/uL (ref 0.00–0.07)
Basophils Absolute: 0.1 10*3/uL (ref 0.0–0.1)
Basophils Relative: 2 %
Eosinophils Absolute: 0.2 10*3/uL (ref 0.0–0.5)
Eosinophils Relative: 4 %
HCT: 38.5 % (ref 36.0–46.0)
Hemoglobin: 12.5 g/dL (ref 12.0–15.0)
Immature Granulocytes: 0 %
Lymphocytes Relative: 31 %
Lymphs Abs: 1.5 10*3/uL (ref 0.7–4.0)
MCH: 29.1 pg (ref 26.0–34.0)
MCHC: 32.5 g/dL (ref 30.0–36.0)
MCV: 89.5 fL (ref 80.0–100.0)
Monocytes Absolute: 0.4 10*3/uL (ref 0.1–1.0)
Monocytes Relative: 8 %
Neutro Abs: 2.8 10*3/uL (ref 1.7–7.7)
Neutrophils Relative %: 55 %
Platelet Count: 246 10*3/uL (ref 150–400)
RBC: 4.3 MIL/uL (ref 3.87–5.11)
RDW: 13.5 % (ref 11.5–15.5)
WBC Count: 5 10*3/uL (ref 4.0–10.5)
nRBC: 0 % (ref 0.0–0.2)

## 2022-06-02 LAB — TSH: TSH: 29.47 u[IU]/mL — ABNORMAL HIGH (ref 0.350–4.500)

## 2022-06-02 MED ORDER — SODIUM CHLORIDE 0.9 % IV SOLN: Freq: Once | INTRAVENOUS | Status: AC

## 2022-06-02 MED ORDER — SODIUM CHLORIDE 0.9 % IV SOLN
400.0000 mg | Freq: Once | INTRAVENOUS | Status: AC
  Administered 2022-06-02: 400 mg via INTRAVENOUS
  Filled 2022-06-02: qty 16

## 2022-06-02 NOTE — Patient Instructions (Signed)
[facility]  Discharge Instructions: Thank you for choosing [facility] to provide your oncology and hematology care.   If you have a lab appointment with the Cancer Center, please go directly to the Cancer Center and check in at the registration area.   Wear comfortable clothing and clothing appropriate for easy access to any Portacath or PICC line.   We strive to give you quality time with your provider. You may need to reschedule your appointment if you arrive late (15 or more minutes).  Arriving late affects you and other patients whose appointments are after yours.  Also, if you miss three or more appointments without notifying the office, you may be dismissed from the clinic at the provider's discretion.      For prescription refill requests, have your pharmacy contact our office and allow 72 hours for refills to be completed.    Today you received the following chemotherapy and/or immunotherapy agents: Keytruda.       To help prevent nausea and vomiting after your treatment, we encourage you to take your nausea medication as directed.  BELOW ARE SYMPTOMS THAT SHOULD BE REPORTED IMMEDIATELY: *FEVER GREATER THAN 100.4 F (38 C) OR HIGHER *CHILLS OR SWEATING *NAUSEA AND VOMITING THAT IS NOT CONTROLLED WITH YOUR NAUSEA MEDICATION *UNUSUAL SHORTNESS OF BREATH *UNUSUAL BRUISING OR BLEEDING *URINARY PROBLEMS (pain or burning when urinating, or frequent urination) *BOWEL PROBLEMS (unusual diarrhea, constipation, pain near the anus) TENDERNESS IN MOUTH AND THROAT WITH OR WITHOUT PRESENCE OF ULCERS (sore throat, sores in mouth, or a toothache) UNUSUAL RASH, SWELLING OR PAIN  UNUSUAL VAGINAL DISCHARGE OR ITCHING   Items with * indicate a potential emergency and should be followed up as soon as possible or go to the Emergency Department if any problems should occur.  Please show the CHEMOTHERAPY ALERT CARD or IMMUNOTHERAPY ALERT CARD at check-in to  the Emergency Department and triage nurse.  Should you have questions after your visit or need to cancel or reschedule your appointment, please contact [facility]  Dept: [phone]  and follow the prompts.  Office hours are 8:00 a.m. to 4:30 p.m. Monday - Friday. Please note that voicemails left after 4:00 p.m. may not be returned until the following business day.  We are closed weekends and major holidays. You have access to a nurse at all times for urgent questions. Please call the main number to the clinic Dept: [phone] and follow the prompts.   For any non-urgent questions, you may also contact your provider using MyChart. We now offer e-Visits for anyone 18 and older to request care online for non-urgent symptoms. For details visit [email or web address].   Also download the MyChart app! Go to the app store, search "MyChart", open the app, select [facility], and log in with your MyChart username and password.  Masks are optional in the cancer centers. If you would like for your care team to wear a mask while they are taking care of you, please let them know. You may have one support person who is at least 60 years old accompany you for your appointments. 

## 2022-06-02 NOTE — Progress Notes (Signed)
Hematology and Oncology Follow Up Visit  Dana Hanson [id number] 1960/12/10 61 y.o. 06/02/2022 9:25 AM Dana Hanson, MDShadad, [name], MD   Principle Diagnosis: 45 year old woman with kidney cancer diagnosed in 2017.  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  She currently has no active disease after treating her isolated area of metastasis. Prior Therapy:  She is status post laparoscopic right radical nephrectomy performed in February 2017.  She was found to have 6.5 cm clear-cell renal cell carcinoma.  She is status post surgical biopsy completed by Dr. Christella Noa on September 09, 2021 of the T6 spinal lesion which confirmed the presence of metastatic renal cell carcinoma.  She is status post spinal stereotactic radiosurgery completed on September 27, 2021 for isolated metastatic lesion of the T6 spine.   Current therapy: Pembrolizumab 200 mg every 3 weeks cycle 1 started on Nov 18, 2021.  She received 400 mg starting with cycle 4 on January 20, 2022.  She she is here for the next cycle of therapy.  The plan is to complete total of 1 year with the last treatment tentatively scheduled in March 2024.   Interim History: [name] is here for a repeat evaluation.  Since last visit, she denies any recent complications or illnesses.  Her performance status remains unchanged without any complications related to Pembrolizumab.  She denies any nausea, vomiting or abdominal pain.  She denies any hospitalizations or illnesses.  She denies any worsening fatigue, skin rash or respiratory complaints.     Medications: Reviewed with no changes. Current Outpatient Medications  Medication Sig Dispense Refill   acetaminophen (TYLENOL) 325 MG tablet Take 2 tablets (650 mg total) by mouth every 6 (six) hours as needed for moderate pain. 30 tablet 0   levothyroxine (SYNTHROID) 50 MCG tablet Take 1 tablet (50 mcg total) by mouth daily before breakfast. 90 tablet 1    Multiple Vitamin (MULITIVITAMIN WITH MINERALS) TABS Take 1 tablet by mouth daily.     Pembrolizumab (KEYTRUDA IV) Inject into the vein.     pregabalin (LYRICA) 50 MG capsule Take 50 mg by mouth 3 (three) times daily.     No current facility-administered medications for this visit.     Allergies: No Known Allergies    Physical Exam:  Blood pressure 121/83, pulse 74, temperature 97.8 F (36.6 C), temperature source Oral, resp. rate 16, weight 175 lb 9 oz (79.6 kg), last menstrual period 10/27/2013, SpO2 96 %.      ECOG: 1    General appearance: Alert, awake without any distress. Head: Atraumatic without abnormalities Oropharynx: Without any thrush or ulcers. Eyes: No scleral icterus. Lymph nodes: No lymphadenopathy noted in the cervical, supraclavicular, or axillary nodes Heart:regular rate and rhythm, without any murmurs or gallops.   Lung: Clear to auscultation without any rhonchi, wheezes or dullness to percussion. Abdomin: Soft, nontender without any shifting dullness or ascites. Musculoskeletal: No clubbing or cyanosis. Neurological: No motor or sensory deficits. Skin: No rashes or lesions.              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  Kidney cancer diagnosed in 2017.  She developed stage IV clear-cell with isolated T6 spinal metastasis diagnosed in January 2023.     She continues to be without any active disease after radiation therapy to her isolated T6 metastasis and currently on adjuvant immunotherapy.  Risks and benefits of continuing Pembrolizumab were discussed at this time.  The plan is to complete 1 year of therapy and her last infusion tentatively scheduled in March.  Upon completing that last cycle of therapy she will have a repeat PET scan and continued surveillance after that.  She is agreeable to this plan.  She will proceed with Pembrolizumab today and repeated in 6 weeks.   2.  Thoracic spine metastasis: She is status post radiation therapy to an isolated T6 metastasis.  She will have a repeat MRI in December 2023.  3.  Autoimmune complications: These complications including pneumonitis, colitis and thyroid disease were reiterated.  She has not experienced any new issues.  4.  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  She is currently on Synthroid with improvement in her TSH and normalization of T4.   5.  Follow-up: In 6 weeks for a follow-up visit.  30  minutes were spent on this encounter.  The time was dedicated to reviewing laboratory data, disease status update, treatment choices and outlining future plan of care review.   Zola Button, MD 11/17/20239:25 AM

## 2022-06-03 LAB — T4: T4, Total: 6.5 ug/dL (ref 4.5–12.0)

## 2022-06-30 ENCOUNTER — Ambulatory Visit
Admission: RE | Admit: 2022-06-30 | Discharge: 2022-06-30 | Disposition: A | Discharge status: Home / Self Care | Payer: Managed Care, Other (non HMO) | Source: Ambulatory Visit | Attending: Radiation Oncology | Admitting: Radiation Oncology

## 2022-06-30 ENCOUNTER — Encounter: Payer: Self-pay | Admitting: Oncology

## 2022-06-30 DIAGNOSIS — C7951 Secondary malignant neoplasm of bone: Secondary | ICD-10-CM

## 2022-06-30 MED ORDER — GADOPICLENOL 0.5 MMOL/ML IV SOLN
7.0000 mL | Freq: Once | INTRAVENOUS | Status: AC | PRN
  Administered 2022-06-30: 7 mL via INTRAVENOUS

## 2022-07-03 ENCOUNTER — Inpatient Hospital Stay: Payer: Managed Care, Other (non HMO) | Attending: Oncology

## 2022-07-03 DIAGNOSIS — C7951 Secondary malignant neoplasm of bone: Secondary | ICD-10-CM | POA: Insufficient documentation

## 2022-07-03 DIAGNOSIS — C641 Malignant neoplasm of right kidney, except renal pelvis: Secondary | ICD-10-CM | POA: Insufficient documentation

## 2022-07-03 DIAGNOSIS — Z5112 Encounter for antineoplastic immunotherapy: Secondary | ICD-10-CM | POA: Insufficient documentation

## 2022-07-03 DIAGNOSIS — Z79899 Other long term (current) drug therapy: Secondary | ICD-10-CM | POA: Insufficient documentation

## 2022-07-05 ENCOUNTER — Ambulatory Visit
Admission: RE | Admit: 2022-07-05 | Discharge: 2022-07-05 | Disposition: A | Discharge status: Home / Self Care | Payer: Managed Care, Other (non HMO) | Source: Ambulatory Visit | Attending: Urology | Admitting: Urology

## 2022-07-05 DIAGNOSIS — C649 Malignant neoplasm of unspecified kidney, except renal pelvis: Secondary | ICD-10-CM

## 2022-07-05 NOTE — Progress Notes (Signed)
Telephone nursing appointment for patient to receive most recent MRI results from 06/30/22 per Ashlyn Bruning PA-C. I verified patient's identity and began nursing interview. Patient reports doing well.   Meaningful use complete.   Patient aware of their 8:30am-07/05/22 telephone appointment w/ Ashlyn Bruning PA-C. I left my extension [phone] in case patient needs anything. Patient verbalized understanding. This concludes the nursing interview.   Patient contact [phone]     Leandra Kern, LPN

## 2022-07-05 NOTE — Progress Notes (Addendum)
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  The patient was notified in advance and gave permission to proceed with this visit format.  She tolerated the stereotactic radiosurgery well with only modest fatigue.                              On review of systems, the patient states that she is doing well in general.  She had significant improvement in her back pain  following treatment and has not had any further pain requiring pain medication.  She denies any focal pain, paraesthesias, weakness or changes in bowel or bladder function. When we spoke last in 12/2021, she reported a recent fall at home where she sustained two hairline fractures in her left humerus that fortunately did not require ORIF.  She was treated by Dr. Aline Brochure and reports an excellent recovery since that time.  She reports that her energy level is back to baseline and overall, she is quite pleased with the progress to date. She has had some recent leg cramping associated with her Keytruda treatments but otherwise feels like she continues to tolerate those treatments well. She follows up routinely with Dr. Alen Blew for management of her systemic  disease. She had a restaging PET scan 03/30/22 which showed stable to slight decrease in hypermetabolism associated with the patient's known pathologic fracture at T6 and no other findings to suggest definite hypermetabolic metastatic disease.  She had a follow up MRI T-spine on 06/30/22 showed the T6 vertebral body bone lesion unchanged compared with 12/29/2021 consistent with metastatic disease with increased central vertebral body height loss. Currently there is approximately 80% central vertebral body height loss as compared to 40-45% height loss on exam in 09/2021. There is a 3 mm retropulsion of the posterior margin of the T6 vertebral body unchanged from the prior exam and no other vertebral body bone lesion to suggest additional metastatic lesions. We reviewed these results today.  ALLERGIES:  has No Known Allergies.  Meds: Current Outpatient Medications  Medication Sig Dispense Refill   acetaminophen (TYLENOL) 325 MG tablet Take 2 tablets (650 mg total) by mouth every 6 (six) hours as needed for moderate pain. 30 tablet 0   HYDROcodone-acetaminophen (NORCO/VICODIN) 5-325 MG tablet Take 1 tablet by mouth every 6 (six) hours as needed for moderate pain. 30 tablet 0   Multiple Vitamin (MULITIVITAMIN WITH MINERALS) TABS Take 1 tablet by mouth daily.     polyethylene glycol-electrolytes (TRILYTE) 420 g solution Take 4,000 mLs by mouth as directed. (Patient not taking: Reported on 12/09/2021) 4000 mL 0   pregabalin (LYRICA) 50 MG capsule Take 50 mg by mouth  3 (three) times daily.     No current facility-administered medications for this encounter.    Physical Findings:  vitals were not taken for this visit.  Pain Assessment Pain Score: 0-No pain/10 Unable to assess due to telephone follow-up visit format.  Lab Findings: Lab Results  Component Value Date   WBC 6.1 12/29/2021   HGB 12.2 12/29/2021   HCT 37.0 12/29/2021   MCV 87.7 12/29/2021   PLT 311 12/29/2021     Radiographic  Findings: MR THORACIC SPINE W WO CONTRAST  Result Date: 12/29/2021 CLINICAL DATA:  Follow-up treated metastatic disease to the thoracic spine. SRS to T6 metastasis completed 10/04/2021. EXAM: MRI THORACIC WITHOUT AND WITH CONTRAST TECHNIQUE: Multiplanar and multiecho pulse sequences of the thoracic spine were obtained without and with intravenous contrast. CONTRAST:  60m MULTIHANCE GADOBENATE DIMEGLUMINE 529 MG/ML IV SOLN COMPARISON:  09/26/2021 FINDINGS: Alignment:  Stable.  No listhesis. Vertebrae: T1 hypointensity with enhancement throughout the compressed T6 vertebra. The area of T1 hypointensity is diminished with restoration of fatty marrow in the posterior body. The right pedicle has also normalized in signal. Given the degree of residual enhancement, presence of viable tumor versus successfully treated tumor is indeterminate. No extraosseous tumor. Resolved signal abnormality to T7 level. No new areas of metastatic involvement. Cord:  Normal signal and morphology. Paraspinal and other soft tissues: Negative for perispinal mass or inflammation. Disc levels: No significant degenerative change or neural impingement. IMPRESSION: Treated T6 metastasis with decreased enhancing area. No extraosseous extension or new level of metastatic disease. Electronically Signed   By: JJorje GuildM.D.   On: 12/29/2021 22:29    Impression/Plan: 1.  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 She has recovered well from the effects of her stereotactic radiosurgery and is currently without complaints.  Unfortunately, there is evidence of progression of the compression fracture at T6 and unclear whether this is related to active disease remaining in the vertebral body or if this is just further collapse of treated disease. Fortunately, she is not having any pain or symptoms associated with the pathologic compression fracture so the recommendation is for a follow up visit with Dr.  CChristella Noato discuss kyphoplasty with repeat biopsy to confirm active vs treated disease.   She has continued in routine follow-up with Dr. SAlen [facility]wand [facility] and will complete the planned 12 months of treatment in 09/2022.  She is in agreement to proceed with the recommended follow up visit with Dr. CChristella Noato discuss kyphoplasty with repeat biopsy for both therapeutic and diagnostic purposes. She prefers to have the visit after the holidays since she is not in pain so we will help to coordinate this.  She appears to have a good understanding of these recommendations and is comfortable and in agreement with the stated plan.  We will plan for repeat MRI imaging in 6 months unless indicated otherwise based on pathology from the procedure and I will plan to follow-up with her by telephone thereafter to review results and recommendations from the multidisciplinary brain and spine tumor conference.  She knows that she is welcome to call anytime in the interim with any questions or concerns related to radiation.  I personally spent 30 minutes in this encounter including chart review, reviewing radiological studies, telephone conversation with the patient, entering orders and completing documentation.     ANicholos Johns PA-C

## 2022-07-13 ENCOUNTER — Other Ambulatory Visit: Payer: 59

## 2022-07-13 ENCOUNTER — Ambulatory Visit: Payer: 59

## 2022-07-13 ENCOUNTER — Ambulatory Visit: Payer: 59 | Admitting: Oncology

## 2022-07-14 ENCOUNTER — Inpatient Hospital Stay: Payer: Managed Care, Other (non HMO)

## 2022-07-14 ENCOUNTER — Ambulatory Visit: Payer: 59 | Admitting: Oncology

## 2022-07-14 ENCOUNTER — Other Ambulatory Visit: Payer: 59

## 2022-07-14 ENCOUNTER — Ambulatory Visit: Payer: 59

## 2022-07-14 ENCOUNTER — Encounter: Payer: Self-pay | Admitting: Oncology

## 2022-07-14 ENCOUNTER — Inpatient Hospital Stay (HOSPITAL_BASED_OUTPATIENT_CLINIC_OR_DEPARTMENT_OTHER): Payer: Managed Care, Other (non HMO) | Admitting: Oncology

## 2022-07-14 DIAGNOSIS — C7951 Secondary malignant neoplasm of bone: Secondary | ICD-10-CM | POA: Diagnosis not present

## 2022-07-14 DIAGNOSIS — C641 Malignant neoplasm of right kidney, except renal pelvis: Secondary | ICD-10-CM

## 2022-07-14 DIAGNOSIS — E039 Hypothyroidism, unspecified: Secondary | ICD-10-CM

## 2022-07-14 DIAGNOSIS — Z5112 Encounter for antineoplastic immunotherapy: Secondary | ICD-10-CM | POA: Diagnosis present

## 2022-07-14 DIAGNOSIS — Z79899 Other long term (current) drug therapy: Secondary | ICD-10-CM | POA: Diagnosis not present

## 2022-07-14 LAB — CMP (CANCER CENTER ONLY)
ALT: 10 U/L (ref 0–44)
AST: 22 U/L (ref 15–41)
Albumin: 4.4 g/dL (ref 3.5–5.0)
Alkaline Phosphatase: 54 U/L (ref 38–126)
Anion gap: 7 (ref 5–15)
BUN: 20 mg/dL (ref 6–20)
CO2: 28 mmol/L (ref 22–32)
Calcium: 9.8 mg/dL (ref 8.9–10.3)
Chloride: 106 mmol/L (ref 98–111)
Creatinine: 1.22 mg/dL — ABNORMAL HIGH (ref 0.44–1.00)
GFR, Estimated: 51 mL/min — ABNORMAL LOW (ref >60)
Glucose, Bld: 104 mg/dL — ABNORMAL HIGH (ref 70–99)
Potassium: 4.1 mmol/L (ref 3.5–5.1)
Sodium: 141 mmol/L (ref 135–145)
Total Bilirubin: 0.7 mg/dL (ref 0.3–1.2)
Total Protein: 7.7 g/dL (ref 6.5–8.1)

## 2022-07-14 LAB — TSH: TSH: 81.801 u[IU]/mL — ABNORMAL HIGH (ref 0.350–4.500)

## 2022-07-14 LAB — CBC WITH DIFFERENTIAL (CANCER CENTER ONLY)
Abs Immature Granulocytes: 0 10*3/uL (ref 0.00–0.07)
Basophils Absolute: 0.1 10*3/uL (ref 0.0–0.1)
Basophils Relative: 2 %
Eosinophils Absolute: 0.3 10*3/uL (ref 0.0–0.5)
Eosinophils Relative: 6 %
HCT: 42.6 % (ref 36.0–46.0)
Hemoglobin: 14.2 g/dL (ref 12.0–15.0)
Immature Granulocytes: 0 %
Lymphocytes Relative: 34 %
Lymphs Abs: 1.6 10*3/uL (ref 0.7–4.0)
MCH: 28.7 pg (ref 26.0–34.0)
MCHC: 33.3 g/dL (ref 30.0–36.0)
MCV: 86.2 fL (ref 80.0–100.0)
Monocytes Absolute: 0.3 10*3/uL (ref 0.1–1.0)
Monocytes Relative: 6 %
Neutro Abs: 2.4 10*3/uL (ref 1.7–7.7)
Neutrophils Relative %: 52 %
Platelet Count: 219 10*3/uL (ref 150–400)
RBC: 4.94 MIL/uL (ref 3.87–5.11)
RDW: 13.8 % (ref 11.5–15.5)
WBC Count: 4.7 10*3/uL (ref 4.0–10.5)
nRBC: 0 % (ref 0.0–0.2)

## 2022-07-14 MED ORDER — SODIUM CHLORIDE 0.9 % IV SOLN
400.0000 mg | Freq: Once | INTRAVENOUS | Status: AC
  Administered 2022-07-14: 400 mg via INTRAVENOUS
  Filled 2022-07-14: qty 16

## 2022-07-14 MED ORDER — SODIUM CHLORIDE 0.9 % IV SOLN: Freq: Once | INTRAVENOUS | Status: AC

## 2022-07-14 NOTE — Progress Notes (Signed)
Hematology and Oncology Follow Up Visit  Dana Hanson [id number] 11-25-1960 61 y.o. 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   Initially diagnosed with localized disease in 2017.   Prior Therapy:  She is status post laparoscopic right radical nephrectomy performed in February 2017.  She was found to have 6.5 cm clear-cell renal cell carcinoma.  She is status post surgical biopsy completed by Dr. Christella Noa on September 09, 2021 of the T6 spinal lesion which confirmed the presence of metastatic renal cell carcinoma.  She is status post spinal stereotactic radiosurgery completed on September 27, 2021 for isolated metastatic lesion of the T6 spine.   Current therapy: Pembrolizumab 200 mg every 3 weeks cycle 1 started on Nov 18, 2021.  She received 400 mg starting with cycle 4 on January 20, 2022. The plan is to complete total of 1 year with the last treatment tentatively scheduled in March 2024.  She returns for the next cycle of treatment.  Interim History: [name] presents today for a follow-up visit.  Since the last visit, she reports feeling well without any new complications related to Pembrolizumab.  She denies any nausea, vomiting or abdominal pain.  She denies any skin rashes or lesions.  She denies any back pain or discomfort.  She denies any neurological deficits.  Pulm status quality of life remains unchanged.     Medications: Updated on review. Current Outpatient Medications  Medication Sig Dispense Refill   acetaminophen (TYLENOL) 325 MG tablet Take 2 tablets (650 mg total) by mouth every 6 (six) hours as needed for moderate pain. 30 tablet 0   Multiple Vitamin (MULITIVITAMIN WITH MINERALS) TABS Take 1 tablet by mouth daily.     Pembrolizumab (KEYTRUDA IV) Inject into the vein.      pregabalin (LYRICA) 50 MG capsule Take 50 mg by mouth 3 (three) times daily.     No current facility-administered medications for this visit.     Allergies: No Known Allergies    Physical Exam:  Blood pressure 124/82, pulse 88, temperature 97.9 F (36.6 C), temperature source Temporal, resp. rate 17, weight 182 lb 1.6 oz (82.6 kg), last menstrual period 10/27/2013, SpO2 97 %.      ECOG: 1   General appearance: Comfortable appearing without any discomfort Head: Normocephalic without any trauma Oropharynx: Mucous membranes are moist and pink without any thrush or ulcers. Eyes: Pupils are equal and round reactive to light. Lymph nodes: No cervical, supraclavicular, inguinal or axillary lymphadenopathy.   Heart:regular rate and rhythm.  S1 and S2 without leg edema. Lung: Clear without any rhonchi or wheezes.  No dullness to percussion. Abdomin: Soft, nontender, nondistended with good bowel sounds.  No hepatosplenomegaly. Musculoskeletal: No joint deformity or effusion.  Full range of motion noted. Neurological: No deficits noted on motor, sensory and deep tendon reflex exam. Skin: No petechial rash or dryness.  Appeared moist.                Lab Results: Lab Results  Component Value Date   WBC 4.7 07/14/2022   HGB 14.2 07/14/2022   HCT 42.6 07/14/2022   MCV 86.2 07/14/2022   PLT 219 07/14/2022     Chemistry      Component Value Date/Time   NA 141 07/14/2022 0936   K 4.1 07/14/2022 0936  CL 106 07/14/2022 0936   CO2 28 07/14/2022 0936   BUN 20 07/14/2022 0936   CREATININE 1.22 (H) 07/14/2022 0936      Component Value Date/Time   CALCIUM 9.8 07/14/2022 0936   ALKPHOS 54 07/14/2022 0936   AST 22 07/14/2022 0936   ALT 10 07/14/2022 0936   BILITOT 0.7 07/14/2022 0936      IMPRESSION: 1. T6 vertebral body bone lesion unchanged compared with 12/29/2021 consistent with metastatic disease with increased central vertebral body height loss. Currently  there is approximately 80% central vertebral body height loss. 3 mm retropulsion of the posterior margin of the T6 vertebral body unchanged from the prior exam. 2. No other vertebral body bone lesion to suggest additional metastatic lesions.     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  Stage IV clear-cell with isolated T6 spinal metastasis diagnosed in January 2023.  She has no evidence of active disease at this time.  She is currently receiving adjuvant Pembrolizumab for her stage IV resected kidney cancer.  She has tolerated therapy well and the plan is to complete 1 year of treatment with the last dose tentatively will be given in March 2024.  Complication associated with this treatment include autoimmune complications, GI toxicity and dermatological issues.  She has not experienced any at this time and willing to continue with treatment.  I have suggested updating her staging scan upon completing therapy in March 2024.  She had a PET scan in September and can be repeated in March.  Upon completing her adjuvant therapy she will continue to be on active surveillance with imaging studies every 6 months.   2.  Thoracic spine metastasis: MRI of the spine obtained on June 30, 2022 did not show any evidence of disease relapse.  She does have increased compression of the vertebra although she is asymptomatic.  Kyphoplasty could be considered at this time she has follow-up with Dr. Christella Noa regarding this issue.  I do not anticipate that this is tumor recurrence at this time.  3.  Autoimmune complications: These were reiterated again.  These complications that include pneumonitis, colitis, hypophysitis and hepatitis were discussed.  She has no evidence to suggest it.   4.  Hypothyroidism: She is currently on Synthroid replacement with normalization of her T4.  Her TSH continues to decline.   5.  Follow-up: She will return in 6 weeks for the next Pembrolizumab  dosing.  30  minutes were discussed on this visit.  The time was spent on updating disease status, complication related to therapy, future plan of care discussion and answering questions regarding prognosis.   Zola Button, MD 12/29/202310:11 AM

## 2022-07-14 NOTE — Patient Instructions (Signed)
[facility]  Discharge Instructions: Thank you for choosing [facility] to provide your oncology and hematology care.   If you have a lab appointment with the [facility], please go directly to the [facility] and check in at the registration area.   Wear comfortable clothing and clothing appropriate for easy access to any Portacath or PICC line.   We strive to give you quality time with your provider. You may need to reschedule your appointment if you arrive late (15 or more minutes).  Arriving late affects you and other patients whose appointments are after yours.  Also, if you miss three or more appointments without notifying the office, you may be dismissed from the clinic at the provider's discretion.      For prescription refill requests, have your pharmacy contact our office and allow 72 hours for refills to be completed.    Today you received the following chemotherapy and/or immunotherapy agents: pembrolizumab      To help prevent nausea and vomiting after your treatment, we encourage you to take your nausea medication as directed.  BELOW ARE SYMPTOMS THAT SHOULD BE REPORTED IMMEDIATELY: *FEVER GREATER THAN 100.4 F (38 C) OR HIGHER *CHILLS OR SWEATING *NAUSEA AND VOMITING THAT IS NOT CONTROLLED WITH YOUR NAUSEA MEDICATION *UNUSUAL SHORTNESS OF BREATH *UNUSUAL BRUISING OR BLEEDING *URINARY PROBLEMS (pain or burning when urinating, or frequent urination) *BOWEL PROBLEMS (unusual diarrhea, constipation, pain near the anus) TENDERNESS IN MOUTH AND THROAT WITH OR WITHOUT PRESENCE OF ULCERS (sore throat, sores in mouth, or a toothache) UNUSUAL RASH, SWELLING OR PAIN  UNUSUAL VAGINAL DISCHARGE OR ITCHING   Items with * indicate a potential emergency and should be followed up as soon as possible or go to the Emergency Department if any problems should occur.  Please show the CHEMOTHERAPY ALERT CARD or IMMUNOTHERAPY ALERT CARD at check-in  to the Emergency Department and triage nurse.  Should you have questions after your visit or need to cancel or reschedule your appointment, please contact [facility]  Dept: [phone]  and follow the prompts.  Office hours are 8:00 a.m. to 4:30 p.m. Monday - Friday. Please note that voicemails left after 4:00 p.m. may not be returned until the following business day.  We are closed weekends and major holidays. You have access to a nurse at all times for urgent questions. Please call the main number to the clinic Dept: [phone] and follow the prompts.   For any non-urgent questions, you may also contact your provider using MyChart. We now offer e-Visits for anyone 29 and older to request care online for non-urgent symptoms. For details visit [email or web address].   Also download the MyChart app! Go to the app store, search "MyChart", open the app, select [facility], and log in with your MyChart username and password.

## 2022-07-28 ENCOUNTER — Other Ambulatory Visit (HOSPITAL_COMMUNITY): Payer: Self-pay | Admitting: Neurosurgery

## 2022-07-28 DIAGNOSIS — S22050A Wedge compression fracture of T5-T6 vertebra, initial encounter for closed fracture: Secondary | ICD-10-CM

## 2022-08-03 ENCOUNTER — Other Ambulatory Visit: Payer: Self-pay | Admitting: Radiation Therapy

## 2022-08-17 ENCOUNTER — Encounter: Payer: Self-pay | Admitting: Oncology

## 2022-08-18 ENCOUNTER — Ambulatory Visit (HOSPITAL_COMMUNITY)
Admission: RE | Admit: 2022-08-18 | Discharge: 2022-08-18 | Disposition: A | Discharge status: Home / Self Care | Payer: Managed Care, Other (non HMO) | Source: Ambulatory Visit | Attending: Neurosurgery | Admitting: Neurosurgery

## 2022-08-18 ENCOUNTER — Encounter: Payer: Self-pay | Admitting: Oncology

## 2022-08-18 DIAGNOSIS — S22050A Wedge compression fracture of T5-T6 vertebra, initial encounter for closed fracture: Secondary | ICD-10-CM | POA: Diagnosis present

## 2022-08-21 ENCOUNTER — Other Ambulatory Visit: Payer: Self-pay | Admitting: Oncology

## 2022-08-21 ENCOUNTER — Inpatient Hospital Stay: Payer: Managed Care, Other (non HMO) | Attending: Oncology

## 2022-08-21 DIAGNOSIS — Z923 Personal history of irradiation: Secondary | ICD-10-CM | POA: Insufficient documentation

## 2022-08-21 DIAGNOSIS — Z5112 Encounter for antineoplastic immunotherapy: Secondary | ICD-10-CM | POA: Insufficient documentation

## 2022-08-21 DIAGNOSIS — C7951 Secondary malignant neoplasm of bone: Secondary | ICD-10-CM | POA: Insufficient documentation

## 2022-08-21 DIAGNOSIS — Z905 Acquired absence of kidney: Secondary | ICD-10-CM | POA: Insufficient documentation

## 2022-08-21 DIAGNOSIS — C641 Malignant neoplasm of right kidney, except renal pelvis: Secondary | ICD-10-CM | POA: Insufficient documentation

## 2022-08-21 DIAGNOSIS — Z79899 Other long term (current) drug therapy: Secondary | ICD-10-CM | POA: Insufficient documentation

## 2022-08-22 ENCOUNTER — Encounter: Payer: Self-pay | Admitting: Internal Medicine

## 2022-08-23 NOTE — Progress Notes (Unsigned)
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   Initially diagnosed with localized disease in 2017.   PRIOR THERAPY: 1) She is status post laparoscopic right radical nephrectomy performed in February 2017.  She was found to have 6.5 cm clear-cell renal cell carcinoma.   2) She is status post surgical biopsy completed by Dr. Christella Noa on September 09, 2021 of the T6 spinal lesion which confirmed the presence of metastatic renal cell carcinoma.   3) She is status post spinal stereotactic radiosurgery completed on September 27, 2021 for isolated metastatic lesion of the T6 spine.  CURRENT THERAPY: Pembrolizumab 200 mg every 3 weeks cycle 1 started on Nov 18, 2021.  She received 400 mg starting with cycle 4 on January 20, 2022. The plan is to complete total of 1 year with the last treatment tentatively scheduled in March 2024.  She returns for the next cycle of treatment.   INTERVAL HISTORY: Dana Hanson 62 y.o. female returns to the clinic today for a follow-up visit.  The patient is currently undergoing immunotherapy with Keytruda IV every 6 weeks.  She is tolerating this well without any concerning adverse side effects except for thyroid dysfunction. She is currently on synthroid but unsure of the dose. This is not on her medication list.  The plan is to complete 1 year of treatment which will be with her next cycle of treatment in March 2024. She sometimes gets leg cramps. Overall, the patient is feeling well today.  She denies any fever, chills, night sweats, unexplained weight loss.  She denies any appetite changes.  Denies any chest pain, shortness of breath, cough, or hemoptysis.  Denies any rashes or skin changes.  Denies any nausea, vomiting, diarrhea, or constipation.  Denies  any abdominal pain.  Denies any unusual back pain.  Denies any headache or visual changes.  Denies any changes with urination with malodorous urine, foul-smelling urine, hematuria, etc.  She is here today for evaluation repeat blood work before undergoing cycle #6.   MEDICAL HISTORY: Past Medical History:  Diagnosis Date   Cancer ([place])    kidney   Gallstone pancreatitis    Hyperlipidemia    IBS (irritable bowel syndrome)    patient denies   Right renal mass     ALLERGIES:  has No Known Allergies.  MEDICATIONS:  Current Outpatient Medications  Medication Sig Dispense Refill   acetaminophen (TYLENOL) 325 MG tablet Take 2 tablets (650 mg total) by mouth every 6 (six) hours as needed for moderate pain. 30 tablet 0   Multiple Vitamin (MULITIVITAMIN WITH MINERALS) TABS Take 1 tablet by mouth daily.     Pembrolizumab (KEYTRUDA IV) Inject into the vein.     pregabalin (LYRICA) 50 MG capsule Take 50 mg by mouth 3 (three) times daily.     No current facility-administered medications for this visit.    SURGICAL HISTORY:  Past Surgical History:  Procedure Laterality Date   CESAREAN SECTION     x 2-tubal with last c-section   CHOLECYSTECTOMY N/A 09/03/2015   Procedure: LAPAROSCOPIC CHOLECYSTECTOMY WITH INTRAOPERATIVE CHOLANGIOGRAM;  Surgeon: Mickeal Skinner, MD;  Location: [facility];  Service: General;  Laterality: N/A;   COLONOSCOPY  08/14/2011   Surgeon: Daneil Dolin, MD; external hemorrhoid tags, likely source of hematochezia, normal  rectum, multiple colonic polyps at the base of the cecum, all less than 5 mm, single 4 mm sigmoid polyp, scattered diverticula.  Pathology revealed tubular adenoma in the cecum, hyperplastic polyp in the sigmoid colon.  Recommended repeat colonoscopy in 3 years.   COLONOSCOPY WITH PROPOFOL N/A 11/30/2021   Procedure: COLONOSCOPY WITH PROPOFOL;  Surgeon: Daneil Dolin, MD;  [facility];  Service: Endoscopy;  Laterality: N/A;  1:45pm    HEMORRHOID SURGERY  11/10/2011   Procedure: HEMORRHOIDECTOMY;  Surgeon: Donato Heinz, MD;  Location: [facility];  Service: General;  Laterality: N/A;   [place] RESECTION  01/15/2015   LAPAROSCOPIC NEPHRECTOMY N/A 09/03/2015   Procedure: LAPAROSCOPIC RADICAL NEPHRECTOMY;  Surgeon: Ardis Hughs, MD;  [facility];  Service: Urology;  Laterality: N/A;   POLYPECTOMY  11/30/2021   Procedure: POLYPECTOMY;  Surgeon: Daneil Dolin, MD;  [facility];  Service: Endoscopy;;   TUBAL LIGATION     with last c-section   VERTEBROPLASTY N/A 09/09/2021   Procedure: Thoracic vertebral biopsy;  Surgeon: Ashok Pall, MD;  Location: [facility];  Service: Neurosurgery;  Laterality: N/A;  RM 21    REVIEW OF SYSTEMS:   Review of Systems  Constitutional: Negative for appetite change, chills, fatigue, fever and unexpected weight change.  HENT: Negative for mouth sores, nosebleeds, sore throat and trouble swallowing.   Eyes: Negative for eye problems and icterus.  Respiratory: Negative for cough, hemoptysis, shortness of breath and wheezing.   Cardiovascular: Negative for chest pain and leg swelling.  Gastrointestinal: Negative for abdominal pain, constipation, diarrhea, nausea and vomiting.  Genitourinary: Negative for bladder incontinence, difficulty urinating, dysuria, frequency and hematuria.   Musculoskeletal: Positive for occasional leg cramps. Negative for back pain, gait problem, neck pain and neck stiffness.  Skin: Negative for itching and rash.  Neurological: Negative for dizziness, extremity weakness, gait problem, headaches, light-headedness and seizures.  Hematological: Negative for adenopathy. Does not bruise/bleed easily.  Psychiatric/Behavioral: Negative for confusion, depression and sleep disturbance. The patient is not nervous/anxious.     PHYSICAL EXAMINATION:  Blood pressure 101/69, pulse 97, temperature 98 F (36.7 C), resp. rate 20, weight 183 lb (83  kg), last menstrual period 10/27/2013, SpO2 100 %.  ECOG PERFORMANCE STATUS: 1  Physical Exam  Constitutional: Oriented to person, place, and time and well-developed, well-nourished, and in no distress.  HENT:  Head: Normocephalic and atraumatic.  Mouth/Throat: Oropharynx is clear and moist. No oropharyngeal exudate.  Eyes: Conjunctivae are normal. Right eye exhibits no discharge. Left eye exhibits no discharge. No scleral icterus.  Neck: Normal range of motion. Neck supple.  Cardiovascular: Normal rate, regular rhythm, normal heart sounds and intact distal pulses.   Pulmonary/Chest: Effort normal and breath sounds normal. No respiratory distress. No wheezes. No rales.  Abdominal: Soft. Bowel sounds are normal. Exhibits no distension and no mass. There is no tenderness.  Musculoskeletal: Normal range of motion. Exhibits no edema.  Lymphadenopathy:    No cervical adenopathy.  Neurological: Alert and oriented to person, place, and time. Exhibits normal muscle tone. Gait normal. Coordination normal.  Skin: Skin is warm and dry. No rash noted. Not diaphoretic. No erythema. No pallor.  Psychiatric: Mood, memory and judgment normal.  Vitals reviewed.  LABORATORY DATA: Lab Results  Component Value Date   WBC 3.8 (L) 08/24/2022   HGB 13.1 08/24/2022   HCT 40.1 08/24/2022   MCV 87.7 08/24/2022   PLT 208 08/24/2022      Chemistry  Component Value Date/Time   NA 144 08/24/2022 0832   K 3.8 08/24/2022 0832   CL 109 08/24/2022 0832   CO2 27 08/24/2022 0832   BUN 19 08/24/2022 0832   CREATININE 1.33 (H) 08/24/2022 0832      Component Value Date/Time   CALCIUM 9.3 08/24/2022 0832   ALKPHOS 51 08/24/2022 0832   AST 18 08/24/2022 0832   ALT 7 08/24/2022 0832   BILITOT 0.7 08/24/2022 9509       RADIOGRAPHIC STUDIES:  CT THORACIC SPINE WO CONTRAST  Result Date: 08/19/2022 CLINICAL DATA:  Follow-up T6 compression fracture EXAM: CT THORACIC SPINE WITHOUT CONTRAST TECHNIQUE:  Multidetector CT images of the thoracic were obtained using the standard protocol without intravenous contrast. RADIATION DOSE REDUCTION: This exam was performed according to the departmental dose-optimization program which includes automated exposure control, adjustment of the mA and/or kV according to patient size and/or use of iterative reconstruction technique. COMPARISON:  Thoracic MRI 06/30/2022 FINDINGS: Alignment: Exaggerated thoracic kyphosis, unchanged.  No listhesis Vertebrae: Known bone lesion in the T6 body with compression fracture and over 50% height loss. Posterior bulging of the cortex with loss of cortex centrally, unchanged. No progressive height loss compared to prior MRI. No acute fracture line is seen. No new lesion or fracture. Subjective osteopenia Paraspinal and other soft tissues: No acute finding. Small pericardial effusion stable from prior PET CT. Disc levels: Ordinary degenerative change without bony impingement. IMPRESSION: T6 body metastasis with pathologic fracture. No new or progressive finding since MRI 06/30/2022. Electronically Signed   By: Jorje Guild M.D.   On: 08/19/2022 11:48     ASSESSMENT/PLAN:  This is a very pleasant 62 year old female that is being followed in the clinic for stage IV renal cell carcinoma.  She was initially diagnosed in 2017 but had evidence of metastatic disease to T6 in February 2023  The patient is status post right radical nephrectomy in February 2017.   She then had evidence of metastatic disease in February 2023 status post biopsy of the T6 lesion which was consistent metastatic renal cell carcinoma.  She then completed radiation to the T6 lesion on 09/27/2021.   The patient is currently undergoing treatment with adjuvant Keytruda for her stage IV resected kidney cancer.  The plan is to complete 1 year of treatment which will be 7 cycles of treatment.  She is here for cycle #6 today.  She is receiving Keytruda 400 mg IV every 6  weeks.  The patient was seen with Dr. Julien Nordmann today.  Labs were reviewed.  Recommend that she proceed with cycle #6 today scheduled.  We will see her back for follow-up visit in 6 weeks for evaluation repeat blood work before undergoing her last cycle of treatment.  I received a message from genetic counseling.   Given the patient's history of renal cell carcinoma and history for tubular adenomas, further evaluation was recommended to see if family medical history for lynch malignancies. She was adopted and does not know her family medical history.   She had a MRI of the spine obtained on June 30, 2022 that did not show any evidence of disease relapse. She does have increased compression of the vertebra although she is asymptomatic. Per Dr. Hazeline Junker last note, kyphoplasty could be considered at this time she has follow-up with Dr. Christella Noa regarding this issue.   She follows with Dr. Louis Meckel from [facility] urology.   The patient was advised to call immediately if she has any concerning symptoms in the  interval. The patient voices understanding of current disease status and treatment options and is in agreement with the current care plan. All questions were answered. The patient knows to call the clinic with any problems, questions or concerns. We can certainly see the patient much sooner if necessary    No orders of the defined types were placed in this encounter.    Aime Meloche L Rawson Minix, PA-C 08/24/22  ADDENDUM: Hematology/Oncology Attending: I had a face-to-face encounter with the patient today.  I reviewed her record, lab and recommended her care plan.  This is a very pleasant 62 years old white female diagnosed with a stage IV clear-cell renal cell carcinoma with isolated metastasis at the T6 in February 2023 but she was initially diagnosed with localized disease in 2017.  The patient is status post laparoscopic right radical nephrectomy in February 2017 then she had surgical  biopsy on September 06, 2021 of the T6 spinal lesion that was confirmed to be consistent with metastatic renal cell carcinoma.  She is status post spinal SBRT completed on September 27, 2021.  The patient has been on treatment with pembrolizumab 200 Mg IV initially every 3 weeks for 1 cycle and then she switched to pembrolizumab 400 Mg IV every 6 weeks status post 4 cycles.  She is here today to establish care with me after her primary oncologist Dr. Alen Blew left the practice. The patient has no complaints today and she has been tolerating her treatment with pembrolizumab fairly well. I recommended for the patient to proceed with cycle #6 today as planned. I reviewed the past medical history, family history as well as social history with the patient today. I will see her back for follow-up visit in 6 weeks for evaluation before the next cycle of her treatment. The patient was advised to call immediately if she has any other concerning symptoms in the interval. The total time spent in the appointment was 30 minutes. Disclaimer: This note was dictated with voice recognition software. Similar sounding words can inadvertently be transcribed and may be missed upon review. Eilleen Kempf, MD

## 2022-08-24 ENCOUNTER — Inpatient Hospital Stay: Payer: Managed Care, Other (non HMO)

## 2022-08-24 ENCOUNTER — Inpatient Hospital Stay (HOSPITAL_BASED_OUTPATIENT_CLINIC_OR_DEPARTMENT_OTHER): Payer: Managed Care, Other (non HMO) | Admitting: Physician Assistant

## 2022-08-24 VITALS — BP 117/74 | HR 78 | Resp 16

## 2022-08-24 VITALS — BP 101/69 | HR 97 | Temp 98.0°F | Resp 20 | Wt 183.0 lb

## 2022-08-24 DIAGNOSIS — C641 Malignant neoplasm of right kidney, except renal pelvis: Secondary | ICD-10-CM | POA: Diagnosis not present

## 2022-08-24 DIAGNOSIS — C649 Malignant neoplasm of unspecified kidney, except renal pelvis: Secondary | ICD-10-CM

## 2022-08-24 DIAGNOSIS — C7951 Secondary malignant neoplasm of bone: Secondary | ICD-10-CM

## 2022-08-24 DIAGNOSIS — Z5112 Encounter for antineoplastic immunotherapy: Secondary | ICD-10-CM

## 2022-08-24 DIAGNOSIS — Z923 Personal history of irradiation: Secondary | ICD-10-CM | POA: Diagnosis not present

## 2022-08-24 DIAGNOSIS — Z79899 Other long term (current) drug therapy: Secondary | ICD-10-CM | POA: Diagnosis not present

## 2022-08-24 DIAGNOSIS — Z905 Acquired absence of kidney: Secondary | ICD-10-CM | POA: Diagnosis not present

## 2022-08-24 LAB — CBC WITH DIFFERENTIAL (CANCER CENTER ONLY)
Abs Immature Granulocytes: 0 10*3/uL (ref 0.00–0.07)
Basophils Absolute: 0.1 10*3/uL (ref 0.0–0.1)
Basophils Relative: 2 %
Eosinophils Absolute: 0.2 10*3/uL (ref 0.0–0.5)
Eosinophils Relative: 5 %
HCT: 40.1 % (ref 36.0–46.0)
Hemoglobin: 13.1 g/dL (ref 12.0–15.0)
Immature Granulocytes: 0 %
Lymphocytes Relative: 36 %
Lymphs Abs: 1.4 10*3/uL (ref 0.7–4.0)
MCH: 28.7 pg (ref 26.0–34.0)
MCHC: 32.7 g/dL (ref 30.0–36.0)
MCV: 87.7 fL (ref 80.0–100.0)
Monocytes Absolute: 0.3 10*3/uL (ref 0.1–1.0)
Monocytes Relative: 8 %
Neutro Abs: 1.9 10*3/uL (ref 1.7–7.7)
Neutrophils Relative %: 49 %
Platelet Count: 208 10*3/uL (ref 150–400)
RBC: 4.57 MIL/uL (ref 3.87–5.11)
RDW: 14.7 % (ref 11.5–15.5)
WBC Count: 3.8 10*3/uL — ABNORMAL LOW (ref 4.0–10.5)
nRBC: 0 % (ref 0.0–0.2)

## 2022-08-24 LAB — CMP (CANCER CENTER ONLY)
ALT: 7 U/L (ref 0–44)
AST: 18 U/L (ref 15–41)
Albumin: 4 g/dL (ref 3.5–5.0)
Alkaline Phosphatase: 51 U/L (ref 38–126)
Anion gap: 8 (ref 5–15)
BUN: 19 mg/dL (ref 6–20)
CO2: 27 mmol/L (ref 22–32)
Calcium: 9.3 mg/dL (ref 8.9–10.3)
Chloride: 109 mmol/L (ref 98–111)
Creatinine: 1.33 mg/dL — ABNORMAL HIGH (ref 0.44–1.00)
GFR, Estimated: 46 mL/min — ABNORMAL LOW (ref >60)
Glucose, Bld: 111 mg/dL — ABNORMAL HIGH (ref 70–99)
Potassium: 3.8 mmol/L (ref 3.5–5.1)
Sodium: 144 mmol/L (ref 135–145)
Total Bilirubin: 0.7 mg/dL (ref 0.3–1.2)
Total Protein: 6.7 g/dL (ref 6.5–8.1)

## 2022-08-24 LAB — TSH: TSH: 42.164 u[IU]/mL — ABNORMAL HIGH (ref 0.350–4.500)

## 2022-08-24 MED ORDER — SODIUM CHLORIDE 0.9 % IV SOLN
400.0000 mg | Freq: Once | INTRAVENOUS | Status: AC
  Administered 2022-08-24: 400 mg via INTRAVENOUS
  Filled 2022-08-24: qty 16

## 2022-08-24 MED ORDER — SODIUM CHLORIDE 0.9 % IV SOLN: Freq: Once | INTRAVENOUS | Status: AC

## 2022-08-24 NOTE — Patient Instructions (Signed)
[facility]  Discharge Instructions: Thank you for choosing [facility] to provide your oncology and hematology care.   If you have a lab appointment with the [facility], please go directly to the [facility] and check in at the registration area.   Wear comfortable clothing and clothing appropriate for easy access to any Portacath or PICC line.   We strive to give you quality time with your provider. You may need to reschedule your appointment if you arrive late (15 or more minutes).  Arriving late affects you and other patients whose appointments are after yours.  Also, if you miss three or more appointments without notifying the office, you may be dismissed from the clinic at the provider's discretion.      For prescription refill requests, have your pharmacy contact our office and allow 72 hours for refills to be completed.    Today you received the following chemotherapy and/or immunotherapy agents: Pembrolizumab      To help prevent nausea and vomiting after your treatment, we encourage you to take your nausea medication as directed.  BELOW ARE SYMPTOMS THAT SHOULD BE REPORTED IMMEDIATELY: *FEVER GREATER THAN 100.4 F (38 C) OR HIGHER *CHILLS OR SWEATING *NAUSEA AND VOMITING THAT IS NOT CONTROLLED WITH YOUR NAUSEA MEDICATION *UNUSUAL SHORTNESS OF BREATH *UNUSUAL BRUISING OR BLEEDING *URINARY PROBLEMS (pain or burning when urinating, or frequent urination) *BOWEL PROBLEMS (unusual diarrhea, constipation, pain near the anus) TENDERNESS IN MOUTH AND THROAT WITH OR WITHOUT PRESENCE OF ULCERS (sore throat, sores in mouth, or a toothache) UNUSUAL RASH, SWELLING OR PAIN  UNUSUAL VAGINAL DISCHARGE OR ITCHING   Items with * indicate a potential emergency and should be followed up as soon as possible or go to the Emergency Department if any problems should occur.  Please show the CHEMOTHERAPY ALERT CARD or IMMUNOTHERAPY ALERT CARD at  check-in to the Emergency Department and triage nurse.  Should you have questions after your visit or need to cancel or reschedule your appointment, please contact [facility]  Dept: [phone]  and follow the prompts.  Office hours are 8:00 a.m. to 4:30 p.m. Monday - Friday. Please note that voicemails left after 4:00 p.m. may not be returned until the following business day.  We are closed weekends and major holidays. You have access to a nurse at all times for urgent questions. Please call the main number to the clinic Dept: [phone] and follow the prompts.   For any non-urgent questions, you may also contact your provider using MyChart. We now offer e-Visits for anyone 34 and older to request care online for non-urgent symptoms. For details visit [email or web address].   Also download the MyChart app! Go to the app store, search "MyChart", open the app, select [facility], and log in with your MyChart username and password.

## 2022-08-26 LAB — T4: T4, Total: 6.2 ug/dL (ref 4.5–12.0)

## 2022-09-13 ENCOUNTER — Other Ambulatory Visit: Payer: Self-pay | Admitting: Neurosurgery

## 2022-09-14 ENCOUNTER — Encounter: Payer: Self-pay | Admitting: Radiology

## 2022-09-14 ENCOUNTER — Other Ambulatory Visit: Payer: Self-pay | Admitting: Radiation Therapy

## 2022-10-05 ENCOUNTER — Inpatient Hospital Stay (HOSPITAL_BASED_OUTPATIENT_CLINIC_OR_DEPARTMENT_OTHER): Payer: Managed Care, Other (non HMO) | Admitting: Internal Medicine

## 2022-10-05 ENCOUNTER — Inpatient Hospital Stay: Payer: Managed Care, Other (non HMO)

## 2022-10-05 ENCOUNTER — Encounter: Payer: Self-pay | Admitting: Internal Medicine

## 2022-10-05 ENCOUNTER — Encounter: Payer: Self-pay | Admitting: Medical Oncology

## 2022-10-05 ENCOUNTER — Inpatient Hospital Stay: Payer: Managed Care, Other (non HMO) | Attending: Oncology

## 2022-10-05 VITALS — BP 125/82 | HR 79 | Temp 98.1°F | Resp 17

## 2022-10-05 DIAGNOSIS — Z5112 Encounter for antineoplastic immunotherapy: Secondary | ICD-10-CM | POA: Diagnosis not present

## 2022-10-05 DIAGNOSIS — C7951 Secondary malignant neoplasm of bone: Secondary | ICD-10-CM | POA: Diagnosis not present

## 2022-10-05 DIAGNOSIS — C641 Malignant neoplasm of right kidney, except renal pelvis: Secondary | ICD-10-CM

## 2022-10-05 DIAGNOSIS — Z79899 Other long term (current) drug therapy: Secondary | ICD-10-CM | POA: Diagnosis not present

## 2022-10-05 LAB — CMP (CANCER CENTER ONLY)
ALT: 6 U/L (ref 0–44)
AST: 16 U/L (ref 15–41)
Albumin: 4.1 g/dL (ref 3.5–5.0)
Alkaline Phosphatase: 53 U/L (ref 38–126)
Anion gap: 7 (ref 5–15)
BUN: 20 mg/dL (ref 6–20)
CO2: 26 mmol/L (ref 22–32)
Calcium: 9.4 mg/dL (ref 8.9–10.3)
Chloride: 110 mmol/L (ref 98–111)
Creatinine: 1.24 mg/dL — ABNORMAL HIGH (ref 0.44–1.00)
GFR, Estimated: 50 mL/min — ABNORMAL LOW (ref >60)
Glucose, Bld: 86 mg/dL (ref 70–99)
Potassium: 3.9 mmol/L (ref 3.5–5.1)
Sodium: 143 mmol/L (ref 135–145)
Total Bilirubin: 0.6 mg/dL (ref 0.3–1.2)
Total Protein: 6.9 g/dL (ref 6.5–8.1)

## 2022-10-05 LAB — CBC WITH DIFFERENTIAL (CANCER CENTER ONLY)
Abs Immature Granulocytes: 0 10*3/uL (ref 0.00–0.07)
Basophils Absolute: 0.1 10*3/uL (ref 0.0–0.1)
Basophils Relative: 2 %
Eosinophils Absolute: 0.2 10*3/uL (ref 0.0–0.5)
Eosinophils Relative: 5 %
HCT: 37.2 % (ref 36.0–46.0)
Hemoglobin: 12.2 g/dL (ref 12.0–15.0)
Immature Granulocytes: 0 %
Lymphocytes Relative: 33 %
Lymphs Abs: 1.4 10*3/uL (ref 0.7–4.0)
MCH: 29.1 pg (ref 26.0–34.0)
MCHC: 32.8 g/dL (ref 30.0–36.0)
MCV: 88.8 fL (ref 80.0–100.0)
Monocytes Absolute: 0.3 10*3/uL (ref 0.1–1.0)
Monocytes Relative: 8 %
Neutro Abs: 2.2 10*3/uL (ref 1.7–7.7)
Neutrophils Relative %: 52 %
Platelet Count: 220 10*3/uL (ref 150–400)
RBC: 4.19 MIL/uL (ref 3.87–5.11)
RDW: 14.6 % (ref 11.5–15.5)
WBC Count: 4.2 10*3/uL (ref 4.0–10.5)
nRBC: 0 % (ref 0.0–0.2)

## 2022-10-05 LAB — TSH: TSH: 33.915 u[IU]/mL — ABNORMAL HIGH (ref 0.350–4.500)

## 2022-10-05 MED ORDER — SODIUM CHLORIDE 0.9 % IV SOLN: Freq: Once | INTRAVENOUS | Status: AC

## 2022-10-05 MED ORDER — SODIUM CHLORIDE 0.9 % IV SOLN
400.0000 mg | Freq: Once | INTRAVENOUS | Status: AC
  Administered 2022-10-05: 400 mg via INTRAVENOUS
  Filled 2022-10-05: qty 16

## 2022-10-05 NOTE — Progress Notes (Signed)
Patient seen by MD today  Vitals are within treatment parameters.  Labs reviewed: and are within treatment parameters.  Per physician team, patient is ready for treatment and there are NO modifications to the treatment plan.  

## 2022-10-05 NOTE — Progress Notes (Signed)
[facility] Telephone:([phone]) [phone]   [phone]  OFFICE PROGRESS NOTE  [name], [place] [address]  DIAGNOSIS: Stage IV clear-cell renal cell carcinoma with isolated area of metastasis at T6 documented in February 2023 without any evidence of any other disease.   Initially diagnosed with localized disease in 2017.    PRIOR THERAPY: 1) She is status post laparoscopic right radical nephrectomy performed in February 2017.  She was found to have 6.5 cm clear-cell renal cell carcinoma.   2) She is status post surgical biopsy completed by Dr. Christella Noa on September 09, 2021 of the T6 spinal lesion which confirmed the presence of metastatic renal cell carcinoma.   3) She is status post spinal stereotactic radiosurgery completed on September 27, 2021 for isolated metastatic lesion of the T6 spine.   CURRENT THERAPY: Pembrolizumab 200 mg every 3 weeks cycle 1 started on Nov 18, 2021.  She received 400 mg starting with cycle 4 on January 20, 2022. The plan is to complete total of 1 year with the last treatment tentatively scheduled in March 2024.  She returns for the next cycle of treatment.   INTERVAL HISTORY: Dana Hanson 62 y.o. female returns to the clinic today for follow-up visit.  The patient is feeling fine today with no concerning complaints.  She is scheduled for spine surgery by Dr. Christella Noa in few weeks.  She denied having any current chest pain, shortness of breath, cough or hemoptysis.  She has no nausea, vomiting, diarrhea or constipation.  She has no headache or visual changes.  She denied having any recent weight loss or night sweats.  She is here today for evaluation before starting cycle #7 and the last dose of her treatment.  MEDICAL HISTORY: Past Medical History:  Diagnosis Date   Cancer ([facility])    kidney   Gallstone pancreatitis    Hyperlipidemia    IBS (irritable bowel syndrome)    patient denies   Right renal mass      ALLERGIES:  has No Known Allergies.  MEDICATIONS:  Current Outpatient Medications  Medication Sig Dispense Refill   acetaminophen (TYLENOL) 500 MG tablet Take 500-1,000 mg by mouth every 6 (six) hours as needed (pain.).     levothyroxine (SYNTHROID) 50 MCG tablet Take 50 mcg by mouth daily before breakfast.     Multiple Vitamin (MULITIVITAMIN WITH MINERALS) TABS Take 1 tablet by mouth in the morning.     Pembrolizumab (KEYTRUDA IV) Inject into the vein.     pregabalin (LYRICA) 75 MG capsule Take 75 mg by mouth 3 (three) times daily.     triamcinolone cream (KENALOG) 0.1 % Apply 1 Application topically daily as needed (skin rash/irritation.).     No current facility-administered medications for this visit.    SURGICAL HISTORY:  Past Surgical History:  Procedure Laterality Date   CESAREAN SECTION     x 2-tubal with last c-section   CHOLECYSTECTOMY N/A 09/03/2015   Procedure: LAPAROSCOPIC CHOLECYSTECTOMY WITH INTRAOPERATIVE CHOLANGIOGRAM;  Surgeon: Mickeal Skinner, MD;  [facility];  Service: General;  Laterality: N/A;   COLONOSCOPY  08/14/2011   Surgeon: Daneil Dolin, MD; external hemorrhoid tags, likely source of hematochezia, normal rectum, multiple colonic polyps at the base of the cecum, all less than 5 mm, single 4 mm sigmoid polyp, scattered diverticula.  Pathology revealed tubular adenoma in the cecum, hyperplastic polyp in the sigmoid colon.  Recommended repeat colonoscopy in 3  years.   COLONOSCOPY WITH PROPOFOL N/A 11/30/2021   Procedure: COLONOSCOPY WITH PROPOFOL;  Surgeon: Daneil Dolin, MD;  [facility];  Service: Endoscopy;  Laterality: N/A;  1:45pm   HEMORRHOID SURGERY  11/10/2011   Procedure: HEMORRHOIDECTOMY;  Surgeon: Donato Heinz, MD;  [facility];  Service: General;  Laterality: N/A;   [place] RESECTION  01/15/2015   LAPAROSCOPIC NEPHRECTOMY N/A 09/03/2015   Procedure: LAPAROSCOPIC RADICAL NEPHRECTOMY;  Surgeon:  Ardis Hughs, MD;  [facility];  Service: Urology;  Laterality: N/A;   POLYPECTOMY  11/30/2021   Procedure: POLYPECTOMY;  Surgeon: Daneil Dolin, MD;  [facility];  Service: Endoscopy;;   TUBAL LIGATION     with last c-section   VERTEBROPLASTY N/A 09/09/2021   Procedure: Thoracic vertebral biopsy;  Surgeon: Ashok Pall, MD;  Location: [facility];  Service: Neurosurgery;  Laterality: N/A;  RM 21    REVIEW OF SYSTEMS:  A comprehensive review of systems was negative except for: Constitutional: positive for fatigue Musculoskeletal: positive for back pain   PHYSICAL EXAMINATION: General appearance: alert, cooperative, fatigued, and no distress Head: Normocephalic, without obvious abnormality, atraumatic Neck: no adenopathy, no JVD, supple, symmetrical, trachea midline, and thyroid not enlarged, symmetric, no tenderness/mass/nodules Lymph nodes: Cervical, supraclavicular, and axillary nodes normal. Resp: clear to auscultation bilaterally Back: symmetric, no curvature. ROM normal. No CVA tenderness. Cardio: regular rate and rhythm, S1, S2 normal, no murmur, click, rub or gallop GI: soft, non-tender; bowel sounds normal; no masses,  no organomegaly Extremities: extremities normal, atraumatic, no cyanosis or edema  ECOG PERFORMANCE STATUS: 1 - Symptomatic but completely ambulatory  Blood pressure 113/82, pulse 83, temperature 97.9 F (36.6 C), temperature source Oral, resp. rate 16, weight 183 lb 6 oz (83.2 kg), last menstrual period 10/27/2013, SpO2 98 %.  LABORATORY DATA: Lab Results  Component Value Date   WBC 4.2 10/05/2022   HGB 12.2 10/05/2022   HCT 37.2 10/05/2022   MCV 88.8 10/05/2022   PLT 220 10/05/2022      Chemistry      Component Value Date/Time   NA 143 10/05/2022 0915   K 3.9 10/05/2022 0915   CL 110 10/05/2022 0915   CO2 26 10/05/2022 0915   BUN 20 10/05/2022 0915   CREATININE 1.24 (H) 10/05/2022 0915      Component Value Date/Time   CALCIUM  9.4 10/05/2022 0915   ALKPHOS 53 10/05/2022 0915   AST 16 10/05/2022 0915   ALT 6 10/05/2022 0915   BILITOT 0.6 10/05/2022 0915       RADIOGRAPHIC STUDIES: No results found.  ASSESSMENT AND PLAN: This is a very pleasant 62 years old white female with Stage IV clear-cell renal cell carcinoma with isolated area of metastasis at T6 documented in February 2023 without any evidence of any other disease.   Initially diagnosed with localized disease in 2017.  She is status post laparoscopic right radical nephrectomy performed in February 2017.  She was found to have 6.5 cm clear-cell renal cell carcinoma.  This was followed by surgical biopsy completed by Dr. Christella Noa on September 09, 2021 of the T6 spinal lesion which confirmed the presence of metastatic renal cell carcinoma. She also has spinal stereotactic radiosurgery completed on September 27, 2021 for isolated metastatic lesion of the T6 spine. The patient is currently on treatment with Pembrolizumab 200 mg every 3 weeks cycle 1 started on Nov 18, 2021.  She received 400 mg starting with cycle 4 on  January 20, 2022. The plan is to complete total of 1 year with the last treatment tentatively scheduled in March 2024.  Marland Kitchen  She has been tolerating this treatment well with no concerning adverse effects. I recommended for her to proceed with cycle #7 today as planned. I will see her back for follow-up visit in 3 months for evaluation with repeat PET scan. The patient was advised to call immediately if she has any concerning symptoms in the interval. The patient voices understanding of current disease status and treatment options and is in agreement with the current care plan.  All questions were answered. The patient knows to call the clinic with any problems, questions or concerns. We can certainly see the patient much sooner if necessary.  The total time spent in the appointment was 20 minutes.  Disclaimer: This note was dictated with voice recognition  software. Similar sounding words can inadvertently be transcribed and may not be corrected upon review.

## 2022-10-05 NOTE — Progress Notes (Signed)
Surgical Instructions    Your procedure is scheduled on Wednesday October 11, 2022.  Report to [facility] Main Entrance "A" at 8:30 A.M., then check in with the Admitting office.  Call this number if you have problems the morning of surgery:  [phone]   If you have any questions prior to your surgery date call [phone]: Open Monday-Friday 8am-4pm If you experience any cold or flu symptoms such as cough, fever, chills, shortness of breath, etc. between now and your scheduled surgery, please notify us at the above number     Remember:  Do not eat after midnight the night before your surgery  You may drink clear liquids until 7:30 the morning of your surgery.   Clear liquids allowed are: Water, Non-Citrus Juices (without pulp), Carbonated Beverages, Clear Tea, Black Coffee ONLY (NO MILK, CREAM OR POWDERED CREAMER of any kind), and Gatorade.    Take these medicines the morning of surgery with A SIP OF WATER:  levothyroxine (SYNTHROID)  pregabalin (LYRICA)  If Needed:  acetaminophen (TYLENOL)   As of today, STOP taking any Aspirin (unless otherwise instructed by your surgeon) Aleve, Naproxen, Ibuprofen, Motrin, Advil, Goody's, BC's, all herbal medications, fish oil, and all vitamins.  SURGICAL WAITING ROOM VISITATION Patients having surgery or a procedure may have no more than 2 support people in the waiting area - these visitors may rotate.   Children under the age of 47 must have an adult with them who is not the patient. If the patient needs to stay at the hospital during part of their recovery, the visitor guidelines for inpatient rooms apply. Pre-op nurse will coordinate an appropriate time for 1 support person to accompany patient in pre-op.  This support person may not rotate.   Please refer to [email or web address] for the visitor guidelines for Inpatients (after your surgery is over and you are in a regular room).    Special  instructions:    Oral Hygiene is also important to reduce your risk of infection.  Remember - BRUSH YOUR TEETH THE MORNING OF SURGERY WITH YOUR REGULAR TOOTHPASTE   - Preparing For Surgery  Before surgery, you can play an important role. Because skin is not sterile, your skin needs to be as free of germs as possible. You can reduce the number of germs on your skin by washing with CHG (chlorahexidine gluconate) Soap before surgery.  CHG is an antiseptic cleaner which kills germs and bonds with the skin to continue killing germs even after washing.     Please do not use if you have an allergy to CHG or antibacterial soaps. If your skin becomes reddened/irritated stop using the CHG.  Do not shave (including legs and underarms) for at least 48 hours prior to first CHG shower. It is OK to shave your face.  Please follow these instructions carefully.     Shower the NIGHT BEFORE SURGERY and the MORNING OF SURGERY with CHG Soap.   If you chose to wash your hair, wash your hair first as usual with your normal shampoo. After you shampoo, rinse your hair and body thoroughly to remove the shampoo.  Then [employer] and genitals (private parts) with your normal soap and rinse thoroughly to remove soap.  After that Use CHG Soap as you would any other liquid soap. You can apply CHG directly to the skin and wash gently with a scrungie or a clean washcloth.   Apply the CHG Soap to your body ONLY FROM THE NECK DOWN.  Do not use on open wounds or open sores. Avoid contact with your eyes, ears, mouth and genitals (private parts). Wash Face and genitals (private parts)  with your normal soap.   Wash thoroughly, paying special attention to the area where your surgery will be performed.  Thoroughly rinse your body with warm water from the neck down.  DO NOT shower/wash with your normal soap after using and rinsing off the CHG Soap.  Pat yourself dry with a CLEAN TOWEL.  Wear CLEAN PAJAMAS to bed the  night before surgery  Place CLEAN SHEETS on your bed the night before your surgery  DO NOT SLEEP WITH PETS.   Day of Surgery:  Take a shower with CHG soap. Wear Clean/Comfortable clothing the morning of surgery Do not apply any deodorants/lotions.   Remember to brush your teeth WITH YOUR REGULAR TOOTHPASTE.  Do not wear jewelry or makeup. Do not wear lotions, powders, perfumes/cologne or deodorant. Do not shave 48 hours prior to surgery.  Men may shave face and neck. Do not bring valuables to the hospital. Do not wear nail polish, gel polish, artificial nails, or any other type of covering on natural nails (fingers and toes) If you have artificial nails or gel coating that need to be removed by a nail salon, please have this removed prior to surgery. Artificial nails or gel coating may interfere with anesthesia's ability to adequately monitor your vital signs.  [facility] is not responsible for any belongings or valuables.    Do NOT Smoke (Tobacco/Vaping)  24 hours prior to your procedure  If you use a CPAP at night, you may bring your mask for your overnight stay.   Contacts, glasses, hearing aids, dentures or partials may not be worn into surgery, please bring cases for these belongings   For patients admitted to the hospital, discharge time will be determined by your treatment team.   Patients discharged the day of surgery will not be allowed to drive home, and someone needs to stay with them for 24 hours.  If you received a COVID test during your pre-op visit, it is requested that you wear a mask when out in public, stay away from anyone that may not be feeling well, and notify your surgeon if you develop symptoms. If you have been in contact with anyone that has tested positive in the last 10 days, please notify your surgeon.    Please read over the following fact sheets that you were given.

## 2022-10-05 NOTE — Patient Instructions (Signed)
[facility]  Discharge Instructions: Thank you for choosing [facility] to provide your oncology and hematology care.   If you have a lab appointment with the Cancer Center, please go directly to the Cancer Center and check in at the registration area.   Wear comfortable clothing and clothing appropriate for easy access to any Portacath or PICC line.   We strive to give you quality time with your provider. You may need to reschedule your appointment if you arrive late (15 or more minutes).  Arriving late affects you and other patients whose appointments are after yours.  Also, if you miss three or more appointments without notifying the office, you may be dismissed from the clinic at the provider's discretion.      For prescription refill requests, have your pharmacy contact our office and allow 72 hours for refills to be completed.    Today you received the following chemotherapy and/or immunotherapy agents: Keytruda      To help prevent nausea and vomiting after your treatment, we encourage you to take your nausea medication as directed.  BELOW ARE SYMPTOMS THAT SHOULD BE REPORTED IMMEDIATELY: *FEVER GREATER THAN 100.4 F (38 C) OR HIGHER *CHILLS OR SWEATING *NAUSEA AND VOMITING THAT IS NOT CONTROLLED WITH YOUR NAUSEA MEDICATION *UNUSUAL SHORTNESS OF BREATH *UNUSUAL BRUISING OR BLEEDING *URINARY PROBLEMS (pain or burning when urinating, or frequent urination) *BOWEL PROBLEMS (unusual diarrhea, constipation, pain near the anus) TENDERNESS IN MOUTH AND THROAT WITH OR WITHOUT PRESENCE OF ULCERS (sore throat, sores in mouth, or a toothache) UNUSUAL RASH, SWELLING OR PAIN  UNUSUAL VAGINAL DISCHARGE OR ITCHING   Items with * indicate a potential emergency and should be followed up as soon as possible or go to the Emergency Department if any problems should occur.  Please show the CHEMOTHERAPY ALERT CARD or IMMUNOTHERAPY ALERT CARD at  check-in to the Emergency Department and triage nurse.  Should you have questions after your visit or need to cancel or reschedule your appointment, please contact [facility]  Dept: [phone]  and follow the prompts.  Office hours are 8:00 a.m. to 4:30 p.m. Monday - Friday. Please note that voicemails left after 4:00 p.m. may not be returned until the following business day.  We are closed weekends and major holidays. You have access to a nurse at all times for urgent questions. Please call the main number to the clinic Dept: [phone] and follow the prompts.   For any non-urgent questions, you may also contact your provider using MyChart. We now offer e-Visits for anyone 18 and older to request care online for non-urgent symptoms. For details visit [email or web address].   Also download the MyChart app! Go to the app store, search "MyChart", open the app, select [facility], and log in with your MyChart username and password.   

## 2022-10-06 ENCOUNTER — Other Ambulatory Visit: Payer: Self-pay

## 2022-10-06 ENCOUNTER — Encounter (HOSPITAL_COMMUNITY)
Admission: RE | Admit: 2022-10-06 | Discharge: 2022-10-06 | Disposition: A | Discharge status: Home / Self Care | Payer: Managed Care, Other (non HMO) | Source: Ambulatory Visit | Attending: Neurosurgery | Admitting: Neurosurgery

## 2022-10-06 ENCOUNTER — Encounter (HOSPITAL_COMMUNITY): Payer: Self-pay

## 2022-10-06 VITALS — BP 102/71 | HR 86 | Temp 97.5°F | Resp 17 | Ht 67.0 in | Wt 183.0 lb

## 2022-10-06 DIAGNOSIS — Z01812 Encounter for preprocedural laboratory examination: Secondary | ICD-10-CM | POA: Insufficient documentation

## 2022-10-06 DIAGNOSIS — Z01818 Encounter for other preprocedural examination: Secondary | ICD-10-CM

## 2022-10-06 HISTORY — DX: Unspecified osteoarthritis, unspecified site: M19.90

## 2022-10-06 HISTORY — DX: Hypothyroidism, unspecified: E03.9

## 2022-10-06 LAB — TYPE AND SCREEN
ABO/RH(D): A POS
Antibody Screen: NEGATIVE

## 2022-10-06 LAB — SURGICAL PCR SCREEN
MRSA, PCR: NEGATIVE
Staphylococcus aureus: NEGATIVE

## 2022-10-06 LAB — T4: T4, Total: 6 ug/dL (ref 4.5–12.0)

## 2022-10-06 NOTE — Progress Notes (Signed)
PCP - Dr. [name] Cardiologist - denies  PPM/ICD - denies   Chest x-ray - 08/26/19 EKG - 10/23/21 Stress Test - denies ECHO - denies Cardiac Cath - denies  Sleep Study - denies   DM- denies  ASA/Blood Thinner Instructions: n/a   ERAS Protcol - yes, no drink   COVID TEST- n/a   Anesthesia review: no  Patient denies shortness of breath, fever, cough and chest pain at PAT appointment   All instructions explained to the patient, with a verbal understanding of the material. Patient agrees to go over the instructions while at home for a better understanding.  The opportunity to ask questions was provided.

## 2022-10-11 ENCOUNTER — Inpatient Hospital Stay (HOSPITAL_COMMUNITY): Payer: Managed Care, Other (non HMO)

## 2022-10-11 ENCOUNTER — Inpatient Hospital Stay (HOSPITAL_COMMUNITY)
Admission: RE | Admit: 2022-10-11 | Discharge: 2022-10-12 | DRG: 519 | Disposition: A | Discharge status: Home / Self Care | Payer: Managed Care, Other (non HMO) | Attending: Neurosurgery | Admitting: Neurosurgery

## 2022-10-11 ENCOUNTER — Encounter (HOSPITAL_COMMUNITY)
Admission: RE | Disposition: A | Discharge status: Home / Self Care | Payer: Self-pay | Source: Home / Self Care | Attending: Neurosurgery

## 2022-10-11 ENCOUNTER — Other Ambulatory Visit: Payer: Self-pay

## 2022-10-11 ENCOUNTER — Encounter (HOSPITAL_COMMUNITY): Payer: Self-pay | Admitting: Neurosurgery

## 2022-10-11 DIAGNOSIS — Z79890 Hormone replacement therapy (postmenopausal): Secondary | ICD-10-CM | POA: Diagnosis not present

## 2022-10-11 DIAGNOSIS — S22059A Unspecified fracture of T5-T6 vertebra, initial encounter for closed fracture: Secondary | ICD-10-CM | POA: Diagnosis not present

## 2022-10-11 DIAGNOSIS — Z79620 Long term (current) use of immunosuppressive biologic: Secondary | ICD-10-CM

## 2022-10-11 DIAGNOSIS — M8448XS Pathological fracture, other site, sequela: Principal | ICD-10-CM

## 2022-10-11 DIAGNOSIS — E039 Hypothyroidism, unspecified: Secondary | ICD-10-CM

## 2022-10-11 DIAGNOSIS — Z79899 Other long term (current) drug therapy: Secondary | ICD-10-CM

## 2022-10-11 DIAGNOSIS — N289 Disorder of kidney and ureter, unspecified: Secondary | ICD-10-CM | POA: Diagnosis not present

## 2022-10-11 DIAGNOSIS — Z905 Acquired absence of kidney: Secondary | ICD-10-CM | POA: Diagnosis not present

## 2022-10-11 DIAGNOSIS — M8458XA Pathological fracture in neoplastic disease, other specified site, initial encounter for fracture: Principal | ICD-10-CM | POA: Diagnosis present

## 2022-10-11 DIAGNOSIS — C641 Malignant neoplasm of right kidney, except renal pelvis: Secondary | ICD-10-CM | POA: Diagnosis present

## 2022-10-11 DIAGNOSIS — Z9884 Bariatric surgery status: Secondary | ICD-10-CM | POA: Diagnosis not present

## 2022-10-11 DIAGNOSIS — E785 Hyperlipidemia, unspecified: Secondary | ICD-10-CM | POA: Diagnosis present

## 2022-10-11 HISTORY — PX: LUMBAR PERCUTANEOUS PEDICLE SCREW 4 LEVEL: SHX6318

## 2022-10-11 SURGERY — LUMBAR PERCUTANEOUS PEDICLE SCREW 4 LEVEL
Anesthesia: General | Site: Spine Thoracic

## 2022-10-11 MED ORDER — PROPOFOL 10 MG/ML IV BOLUS
INTRAVENOUS | Status: DC | PRN
  Administered 2022-10-11: 160 mg via INTRAVENOUS

## 2022-10-11 MED ORDER — FENTANYL CITRATE (PF) 250 MCG/5ML IJ SOLN
INTRAMUSCULAR | Status: DC | PRN
  Administered 2022-10-11 (×2): 50 ug via INTRAVENOUS
  Administered 2022-10-11: 25 ug via INTRAVENOUS

## 2022-10-11 MED ORDER — PHENYLEPHRINE HCL-NACL 20-0.9 MG/250ML-% IV SOLN
INTRAVENOUS | Status: DC | PRN
  Administered 2022-10-11: 25 ug/min via INTRAVENOUS

## 2022-10-11 MED ORDER — SENNOSIDES-DOCUSATE SODIUM 8.6-50 MG PO TABS: 1.0000 | ORAL_TABLET | Freq: Every evening | ORAL | Status: DC | PRN

## 2022-10-11 MED ORDER — PREGABALIN 75 MG PO CAPS
75.0000 mg | ORAL_CAPSULE | Freq: Three times a day (TID) | ORAL | Status: DC
  Administered 2022-10-11 – 2022-10-12 (×3): 75 mg via ORAL
  Filled 2022-10-11 (×3): qty 1

## 2022-10-11 MED ORDER — SODIUM CHLORIDE 0.9 % IV SOLN: 250.0000 mL | INTRAVENOUS | Status: DC

## 2022-10-11 MED ORDER — MENTHOL 3 MG MT LOZG: 1.0000 | LOZENGE | OROMUCOSAL | Status: DC | PRN

## 2022-10-11 MED ORDER — FENTANYL CITRATE (PF) 250 MCG/5ML IJ SOLN
INTRAMUSCULAR | Status: AC
  Filled 2022-10-11: qty 5

## 2022-10-11 MED ORDER — MIDAZOLAM HCL 2 MG/2ML IJ SOLN
INTRAMUSCULAR | Status: DC | PRN
  Administered 2022-10-11 (×2): 1 mg via INTRAVENOUS

## 2022-10-11 MED ORDER — SCOPOLAMINE 1 MG/3DAYS TD PT72
MEDICATED_PATCH | TRANSDERMAL | Status: AC
  Filled 2022-10-11: qty 1

## 2022-10-11 MED ORDER — LACTATED RINGERS IV SOLN: INTRAVENOUS | Status: DC

## 2022-10-11 MED ORDER — PROMETHAZINE HCL 25 MG/ML IJ SOLN: 6.2500 mg | INTRAMUSCULAR | Status: DC | PRN

## 2022-10-11 MED ORDER — ACETAMINOPHEN 650 MG RE SUPP: 650.0000 mg | RECTAL | Status: DC | PRN

## 2022-10-11 MED ORDER — OXYCODONE HCL 5 MG PO TABS
10.0000 mg | ORAL_TABLET | ORAL | Status: DC | PRN
  Administered 2022-10-11 – 2022-10-12 (×4): 10 mg via ORAL
  Filled 2022-10-11 (×4): qty 2

## 2022-10-11 MED ORDER — SCOPOLAMINE 1 MG/3DAYS TD PT72
1.0000 | MEDICATED_PATCH | TRANSDERMAL | Status: DC
  Administered 2022-10-11: 1.5 mg via TRANSDERMAL

## 2022-10-11 MED ORDER — ZOLPIDEM TARTRATE 5 MG PO TABS
5.0000 mg | ORAL_TABLET | Freq: Every evening | ORAL | Status: DC | PRN
  Filled 2022-10-11: qty 1

## 2022-10-11 MED ORDER — PHENYLEPHRINE 80 MCG/ML (10ML) SYRINGE FOR IV PUSH (FOR BLOOD PRESSURE SUPPORT)
PREFILLED_SYRINGE | INTRAVENOUS | Status: AC
  Filled 2022-10-11: qty 10

## 2022-10-11 MED ORDER — ORAL CARE MOUTH RINSE: 15.0000 mL | Freq: Once | OROMUCOSAL | Status: AC

## 2022-10-11 MED ORDER — SODIUM CHLORIDE 0.9% FLUSH
3.0000 mL | Freq: Two times a day (BID) | INTRAVENOUS | Status: DC
  Administered 2022-10-11 – 2022-10-12 (×2): 3 mL via INTRAVENOUS

## 2022-10-11 MED ORDER — DEXAMETHASONE SODIUM PHOSPHATE 10 MG/ML IJ SOLN
INTRAMUSCULAR | Status: AC
  Filled 2022-10-11: qty 1

## 2022-10-11 MED ORDER — ONDANSETRON HCL 4 MG PO TABS: 4.0000 mg | ORAL_TABLET | Freq: Four times a day (QID) | ORAL | Status: DC | PRN

## 2022-10-11 MED ORDER — ACETAMINOPHEN 500 MG PO TABS
1000.0000 mg | ORAL_TABLET | Freq: Four times a day (QID) | ORAL | Status: DC
  Administered 2022-10-11 – 2022-10-12 (×3): 1000 mg via ORAL
  Filled 2022-10-11 (×4): qty 2

## 2022-10-11 MED ORDER — BISACODYL 5 MG PO TBEC: 5.0000 mg | DELAYED_RELEASE_TABLET | Freq: Every day | ORAL | Status: DC | PRN

## 2022-10-11 MED ORDER — DEXAMETHASONE SODIUM PHOSPHATE 10 MG/ML IJ SOLN
INTRAMUSCULAR | Status: DC | PRN
  Administered 2022-10-11: 10 mg via INTRAVENOUS

## 2022-10-11 MED ORDER — HYDROCODONE-ACETAMINOPHEN 7.5-325 MG PO TABS: 1.0000 | ORAL_TABLET | ORAL | Status: DC | PRN

## 2022-10-11 MED ORDER — MAGNESIUM CITRATE PO SOLN: 1.0000 | Freq: Once | ORAL | Status: DC | PRN

## 2022-10-11 MED ORDER — ADULT MULTIVITAMIN W/MINERALS CH
1.0000 | ORAL_TABLET | Freq: Every morning | ORAL | Status: DC
  Administered 2022-10-12: 1 via ORAL
  Filled 2022-10-11: qty 1

## 2022-10-11 MED ORDER — LEVOTHYROXINE SODIUM 50 MCG PO TABS
50.0000 ug | ORAL_TABLET | Freq: Every day | ORAL | Status: DC
  Administered 2022-10-12: 50 ug via ORAL
  Filled 2022-10-11: qty 1

## 2022-10-11 MED ORDER — BUPIVACAINE HCL (PF) 0.5 % IJ SOLN
INTRAMUSCULAR | Status: AC
  Filled 2022-10-11: qty 30

## 2022-10-11 MED ORDER — SODIUM CHLORIDE 0.9% FLUSH: 3.0000 mL | INTRAVENOUS | Status: DC | PRN

## 2022-10-11 MED ORDER — THROMBIN 20000 UNITS EX SOLR: CUTANEOUS | Status: DC | PRN

## 2022-10-11 MED ORDER — SENNA 8.6 MG PO TABS
1.0000 | ORAL_TABLET | Freq: Two times a day (BID) | ORAL | Status: DC
  Administered 2022-10-11 – 2022-10-12 (×2): 8.6 mg via ORAL
  Filled 2022-10-11 (×2): qty 1

## 2022-10-11 MED ORDER — CEFAZOLIN SODIUM-DEXTROSE 2-4 GM/100ML-% IV SOLN
2.0000 g | INTRAVENOUS | Status: AC
  Administered 2022-10-11: 2 g via INTRAVENOUS
  Filled 2022-10-11: qty 100

## 2022-10-11 MED ORDER — THROMBIN 20000 UNITS EX SOLR
CUTANEOUS | Status: AC
  Filled 2022-10-11: qty 20000

## 2022-10-11 MED ORDER — POTASSIUM CHLORIDE IN NACL 20-0.9 MEQ/L-% IV SOLN
INTRAVENOUS | Status: DC
  Filled 2022-10-11 (×2): qty 1000

## 2022-10-11 MED ORDER — PHENOL 1.4 % MT LIQD: 1.0000 | OROMUCOSAL | Status: DC | PRN

## 2022-10-11 MED ORDER — ONDANSETRON HCL 4 MG/2ML IJ SOLN: 4.0000 mg | Freq: Four times a day (QID) | INTRAMUSCULAR | Status: DC | PRN

## 2022-10-11 MED ORDER — ROCURONIUM BROMIDE 10 MG/ML (PF) SYRINGE
PREFILLED_SYRINGE | INTRAVENOUS | Status: DC | PRN
  Administered 2022-10-11: 20 mg via INTRAVENOUS
  Administered 2022-10-11: 80 mg via INTRAVENOUS

## 2022-10-11 MED ORDER — CHLORHEXIDINE GLUCONATE 0.12 % MT SOLN
15.0000 mL | Freq: Once | OROMUCOSAL | Status: AC
  Administered 2022-10-11: 15 mL via OROMUCOSAL
  Filled 2022-10-11: qty 15

## 2022-10-11 MED ORDER — AMISULPRIDE (ANTIEMETIC) 5 MG/2ML IV SOLN: 10.0000 mg | Freq: Once | INTRAVENOUS | Status: DC | PRN

## 2022-10-11 MED ORDER — PHENYLEPHRINE 80 MCG/ML (10ML) SYRINGE FOR IV PUSH (FOR BLOOD PRESSURE SUPPORT)
PREFILLED_SYRINGE | INTRAVENOUS | Status: DC | PRN
  Administered 2022-10-11 (×2): 80 ug via INTRAVENOUS

## 2022-10-11 MED ORDER — ACETAMINOPHEN 500 MG PO TABS
1000.0000 mg | ORAL_TABLET | Freq: Once | ORAL | Status: AC
  Administered 2022-10-11: 1000 mg via ORAL

## 2022-10-11 MED ORDER — ACETAMINOPHEN 500 MG PO TABS
ORAL_TABLET | ORAL | Status: AC
  Filled 2022-10-11: qty 2

## 2022-10-11 MED ORDER — LIDOCAINE-EPINEPHRINE 0.5 %-1:200000 IJ SOLN
INTRAMUSCULAR | Status: DC | PRN
  Administered 2022-10-11: 10 mL
  Administered 2022-10-11: 16 mL

## 2022-10-11 MED ORDER — MIDAZOLAM HCL 2 MG/2ML IJ SOLN
INTRAMUSCULAR | Status: AC
  Filled 2022-10-11: qty 2

## 2022-10-11 MED ORDER — ONDANSETRON HCL 4 MG/2ML IJ SOLN
INTRAMUSCULAR | Status: DC | PRN
  Administered 2022-10-11: 4 mg via INTRAVENOUS

## 2022-10-11 MED ORDER — FENTANYL CITRATE (PF) 100 MCG/2ML IJ SOLN
INTRAMUSCULAR | Status: AC
  Filled 2022-10-11: qty 2

## 2022-10-11 MED ORDER — PROPOFOL 10 MG/ML IV BOLUS
INTRAVENOUS | Status: AC
  Filled 2022-10-11: qty 20

## 2022-10-11 MED ORDER — DIAZEPAM 5 MG PO TABS
5.0000 mg | ORAL_TABLET | Freq: Four times a day (QID) | ORAL | Status: DC | PRN
  Administered 2022-10-11: 5 mg via ORAL
  Filled 2022-10-11: qty 1

## 2022-10-11 MED ORDER — ROCURONIUM BROMIDE 10 MG/ML (PF) SYRINGE
PREFILLED_SYRINGE | INTRAVENOUS | Status: AC
  Filled 2022-10-11: qty 10

## 2022-10-11 MED ORDER — CHLORHEXIDINE GLUCONATE CLOTH 2 % EX PADS: 6.0000 | MEDICATED_PAD | Freq: Once | CUTANEOUS | Status: DC

## 2022-10-11 MED ORDER — OXYCODONE HCL ER 10 MG PO T12A
10.0000 mg | EXTENDED_RELEASE_TABLET | Freq: Two times a day (BID) | ORAL | Status: DC
  Administered 2022-10-11 – 2022-10-12 (×2): 10 mg via ORAL
  Filled 2022-10-11 (×2): qty 1

## 2022-10-11 MED ORDER — FENTANYL CITRATE (PF) 100 MCG/2ML IJ SOLN
25.0000 ug | INTRAMUSCULAR | Status: DC | PRN
  Administered 2022-10-11 (×2): 50 ug via INTRAVENOUS

## 2022-10-11 MED ORDER — LIDOCAINE 2% (20 MG/ML) 5 ML SYRINGE
INTRAMUSCULAR | Status: AC
  Filled 2022-10-11: qty 5

## 2022-10-11 MED ORDER — ACETAMINOPHEN 325 MG PO TABS: 650.0000 mg | ORAL_TABLET | ORAL | Status: DC | PRN

## 2022-10-11 MED ORDER — 0.9 % SODIUM CHLORIDE (POUR BTL) OPTIME
TOPICAL | Status: DC | PRN
  Administered 2022-10-11: 1000 mL

## 2022-10-11 MED ORDER — LIDOCAINE-EPINEPHRINE 0.5 %-1:200000 IJ SOLN
INTRAMUSCULAR | Status: AC
  Filled 2022-10-11: qty 50

## 2022-10-11 MED ORDER — LIDOCAINE 2% (20 MG/ML) 5 ML SYRINGE
INTRAMUSCULAR | Status: DC | PRN
  Administered 2022-10-11: 100 mg via INTRAVENOUS

## 2022-10-11 SURGICAL SUPPLY — 61 items
ADH SKN CLS APL DERMABOND .7 (GAUZE/BANDAGES/DRESSINGS) ×2
APL SKNCLS STERI-STRIP NONHPOA (GAUZE/BANDAGES/DRESSINGS)
BAG COUNTER SPONGE SURGICOUNT (BAG) ×2 IMPLANT
BAG SPNG CNTER NS LX DISP (BAG) ×1
BENZOIN TINCTURE PRP APPL 2/3 (GAUZE/BANDAGES/DRESSINGS) IMPLANT
BUR MATCHSTICK NEURO 3.0 LAGG (BURR) ×2 IMPLANT
BUR PRECISION FLUTE 5.0 (BURR) ×2 IMPLANT
CANISTER SUCT 3000ML PPV (MISCELLANEOUS) ×2 IMPLANT
CNTNR URN SCR LID CUP LEK RST (MISCELLANEOUS) ×2 IMPLANT
CONT SPEC 4OZ STRL OR WHT (MISCELLANEOUS) ×1
COVER BACK TABLE 60X90IN (DRAPES) IMPLANT
DERMABOND ADVANCED .7 DNX12 (GAUZE/BANDAGES/DRESSINGS) ×2 IMPLANT
DRAPE C-ARM 42X72 X-RAY (DRAPES) ×4 IMPLANT
DRAPE C-ARMOR (DRAPES) IMPLANT
DRAPE LAPAROTOMY 100X72X124 (DRAPES) ×2 IMPLANT
DRAPE SURG 17X23 STRL (DRAPES) ×2 IMPLANT
DRSG OPSITE POSTOP 4X8 (GAUZE/BANDAGES/DRESSINGS) IMPLANT
DURAPREP 26ML APPLICATOR (WOUND CARE) ×2 IMPLANT
ELECT REM PT RETURN 9FT ADLT (ELECTROSURGICAL) ×1
ELECTRODE REM PT RTRN 9FT ADLT (ELECTROSURGICAL) ×2 IMPLANT
GAUZE 4X4 16PLY ~~LOC~~+RFID DBL (SPONGE) IMPLANT
GAUZE SPONGE 4X4 12PLY STRL (GAUZE/BANDAGES/DRESSINGS) IMPLANT
GLOVE ECLIPSE 6.5 STRL STRAW (GLOVE) ×4 IMPLANT
GOWN STRL REUS W/ TWL LRG LVL3 (GOWN DISPOSABLE) ×4 IMPLANT
GOWN STRL REUS W/ TWL XL LVL3 (GOWN DISPOSABLE) IMPLANT
GOWN STRL REUS W/TWL 2XL LVL3 (GOWN DISPOSABLE) IMPLANT
GOWN STRL REUS W/TWL LRG LVL3 (GOWN DISPOSABLE) ×2
GOWN STRL REUS W/TWL XL LVL3 (GOWN DISPOSABLE)
GUIDEWIRE NITI 1.4X495 2-PK (WIRE) IMPLANT
KIT BASIN OR (CUSTOM PROCEDURE TRAY) ×2 IMPLANT
KIT POSITION SURG JACKSON T1 (MISCELLANEOUS) ×2 IMPLANT
KIT TURNOVER KIT B (KITS) ×2 IMPLANT
NDL BIOPSY DD SERENGETI 8G (NEEDLE) IMPLANT
NDL HYPO 25X1 1.5 SAFETY (NEEDLE) ×2 IMPLANT
NDL PED SERENGETI 11G (NEEDLE) IMPLANT
NDL SPNL 18GX3.5 QUINCKE PK (NEEDLE) IMPLANT
NEEDLE BIOPSY DD SERENGETI 8G (NEEDLE) ×3 IMPLANT
NEEDLE HYPO 25X1 1.5 SAFETY (NEEDLE) ×1 IMPLANT
NEEDLE PED SERENGETI 11G (NEEDLE) ×1 IMPLANT
NEEDLE SPNL 18GX3.5 QUINCKE PK (NEEDLE) IMPLANT
NS IRRIG 1000ML POUR BTL (IV SOLUTION) ×2 IMPLANT
PACK LAMINECTOMY NEURO (CUSTOM PROCEDURE TRAY) ×2 IMPLANT
PAD ARMBOARD 7.5X6 YLW CONV (MISCELLANEOUS) ×6 IMPLANT
ROD SPINAL DENALI 5.5120 NS (Rod) IMPLANT
SCREW CANN PA EVEREST 5.5X35 (Screw) IMPLANT
SCREW CANN PA EVEREST 5.5X40 (Screw) IMPLANT
SCREW PA FENS EVEREST 6.5X40 (Screw) IMPLANT
SET SCREW (Screw) ×8 IMPLANT
SET SCREW VRST (Screw) IMPLANT
SPIKE FLUID TRANSFER (MISCELLANEOUS) ×2 IMPLANT
SPONGE SURGIFOAM ABS GEL 100 (HEMOSTASIS) ×2 IMPLANT
SPONGE T-LAP 4X18 ~~LOC~~+RFID (SPONGE) IMPLANT
STRIP CLOSURE SKIN 1/2X4 (GAUZE/BANDAGES/DRESSINGS) IMPLANT
SUT PROLENE 6 0 BV (SUTURE) IMPLANT
SUT VIC AB 0 CT1 18XCR BRD8 (SUTURE) ×2 IMPLANT
SUT VIC AB 0 CT1 8-18 (SUTURE) ×1
SUT VIC AB 2-0 CT1 18 (SUTURE) ×2 IMPLANT
SUT VIC AB 3-0 SH 8-18 (SUTURE) ×2 IMPLANT
TOWEL GREEN STERILE (TOWEL DISPOSABLE) ×2 IMPLANT
TOWEL GREEN STERILE FF (TOWEL DISPOSABLE) ×2 IMPLANT
WATER STERILE IRR 1000ML POUR (IV SOLUTION) ×2 IMPLANT

## 2022-10-11 NOTE — Anesthesia Postprocedure Evaluation (Signed)
Anesthesia Post Note  Patient: Dana Hanson  Procedure(s) Performed: OPEN REDUCTION INTERNAL FIXATION OF THORACIC SIX PATHOLOGIC FRACTURE (Spine Thoracic)     Patient location during evaluation: PACU Anesthesia Type: General Level of consciousness: sedated Pain management: pain level controlled Vital Signs Assessment: post-procedure vital signs reviewed and stable Respiratory status: spontaneous breathing and respiratory function stable Cardiovascular status: stable Postop Assessment: no apparent nausea or vomiting Anesthetic complications: no  No notable events documented.  Last Vitals:  Vitals:   10/11/22 1630 10/11/22 1645  BP: 132/82 138/85  Pulse: 68 70  Resp: 14 16  Temp:  36.4 C  SpO2: 97% 96%    Last Pain:  Vitals:   10/11/22 1630  TempSrc:   PainSc: 0-No pain                 Jennelle Pinkstaff DANIEL

## 2022-10-11 NOTE — H&P (Signed)
Dana Hanson is an 62 y.o. female.   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   Initially diagnosed with localized disease in 2017.    PRIOR THERAPY: 1) She is status post laparoscopic right radical nephrectomy performed in February 2017.  She was found to have 6.5 cm clear-cell renal cell carcinoma.   2) She is status post surgical biopsy completed by myself on September 09, 2021 of the T6 spinal lesion which confirmed the presence of metastatic renal cell carcinoma.   3) She is status post spinal stereotactic radiosurgery completed on September 27, 2021 for isolated metastatic lesion of the T6 spine.   CURRENT THERAPY: Pembrolizumab 200 mg every 3 weeks cycle 1 started on Nov 18, 2021.  She received 400 mg starting with cycle 4 on January 20, 2022. The plan is to complete total of 1 year with the last treatment tentatively scheduled in March 2024.   She is Admitted today for percutaneous stabilization of the pathological T6 fracture.  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   IBS (irritable bowel syndrome)    patient denies   Right renal mass     Past Surgical History:  Procedure Laterality Date   CESAREAN SECTION     x 2-tubal with last c-section   CHOLECYSTECTOMY N/A 09/03/2015   Procedure: LAPAROSCOPIC CHOLECYSTECTOMY WITH INTRAOPERATIVE CHOLANGIOGRAM;  Surgeon: Mickeal Skinner, MD;  Location: [facility];  Service: General;  Laterality: N/A;   COLONOSCOPY  08/14/2011   Surgeon: Daneil Dolin, MD; external hemorrhoid tags, likely source of hematochezia, normal rectum, multiple colonic polyps at the base of the cecum, all less than 5 mm, single 4 mm sigmoid polyp, scattered diverticula.  Pathology  revealed tubular adenoma in the cecum, hyperplastic polyp in the sigmoid colon.  Recommended repeat colonoscopy in 3 years.   COLONOSCOPY WITH PROPOFOL N/A 11/30/2021   Procedure: COLONOSCOPY WITH PROPOFOL;  Surgeon: Daneil Dolin, MD;  Location: [facility];  Service: Endoscopy;  Laterality: N/A;  1:45pm   HEMORRHOID SURGERY  11/10/2011   Procedure: HEMORRHOIDECTOMY;  Surgeon: Donato Heinz, MD;  Location: [facility];  Service: General;  Laterality: N/A;   [place] RESECTION  01/15/2015   LAPAROSCOPIC NEPHRECTOMY N/A 09/03/2015   Procedure: LAPAROSCOPIC RADICAL NEPHRECTOMY;  Surgeon: Ardis Hughs, MD;  Location: [facility];  Service: Urology;  Laterality: N/A;   POLYPECTOMY  11/30/2021   Procedure: POLYPECTOMY;  Surgeon: Daneil Dolin, MD;  Location: [facility];  Service: Endoscopy;;   TUBAL LIGATION     with last c-section   VERTEBROPLASTY N/A 09/09/2021   Procedure: Thoracic vertebral biopsy;  Surgeon: Ashok Pall, MD;  Location: [facility];  Service: Neurosurgery;  Laterality: N/A;  RM 21    Family History  Adopted: Yes  Problem Relation Age of Onset   Colon cancer Neg Hx    Liver disease Neg Hx    Anesthesia problems Neg Hx    Hypotension Neg Hx    Malignant hyperthermia Neg Hx    Pseudochol deficiency Neg Hx    Social History:  reports that she has never smoked. She has never used smokeless tobacco. She reports that  she does not drink alcohol and does not use drugs.  Allergies: No Known Allergies  Medications Prior to Admission  Medication Sig Dispense Refill   acetaminophen (TYLENOL) 500 MG tablet Take 500-1,000 mg by mouth every 6 (six) hours as needed (pain.).     levothyroxine (SYNTHROID) 50 MCG tablet Take 50 mcg by mouth daily before breakfast.     Multiple Vitamin (MULITIVITAMIN WITH MINERALS) TABS Take 1 tablet by mouth in the morning.     Pembrolizumab (KEYTRUDA IV) Inject into the vein.     pregabalin (LYRICA) 75 MG capsule Take 75 mg by  mouth 3 (three) times daily.     triamcinolone cream (KENALOG) 0.1 % Apply 1 Application topically daily as needed (skin rash/irritation.).      No results found for this or any previous visit (from the past 48 hour(s)). No results found.  Review of Systems  Blood pressure 123/79, pulse 80, temperature 98.1 F (36.7 C), temperature source Oral, resp. rate 18, height 5\' 7"  (1.702 m), weight 80.7 kg, last menstrual period 10/27/2013, SpO2 95 %. Physical Exam Constitutional:      General: She is not in acute distress.    Appearance: Normal appearance.  HENT:     Head: Normocephalic and atraumatic.     Right Ear: Tympanic membrane, ear canal and external ear normal.     Left Ear: Tympanic membrane, ear canal and external ear normal.     Nose: Nose normal.     Mouth/Throat:     Mouth: Mucous membranes are moist.     Pharynx: Oropharynx is clear.  Eyes:     Extraocular Movements: Extraocular movements intact.     Pupils: Pupils are equal, round, and reactive to light.  Cardiovascular:     Rate and Rhythm: Normal rate.  Pulmonary:     Effort: Pulmonary effort is normal.     Breath sounds: Normal breath sounds.  Abdominal:     General: Abdomen is flat. Bowel sounds are normal.  Genitourinary:    General: Normal vulva.  Musculoskeletal:        General: Normal range of motion.     Cervical back: Normal range of motion and neck supple.  Neurological:     General: No focal deficit present.     Mental Status: She is alert and oriented to person, place, and time.     Cranial Nerves: Cranial nerves 2-12 are intact.     Sensory: Sensation is intact.     Motor: Motor function is intact.     Coordination: Coordination is intact.     Gait: Gait is intact.     Deep Tendon Reflexes: Reflexes are normal and symmetric. Babinski sign absent on the right side. Babinski sign absent on the left side.      Assessment/Plan OR for T4-T8 stabilization via pedicle screws.   Ashok Pall,  MD 10/11/2022, 12:04 PM

## 2022-10-11 NOTE — Anesthesia Preprocedure Evaluation (Signed)
Anesthesia Evaluation  Patient identified by MRN, date of birth, ID band Patient awake    Reviewed: Allergy & Precautions, H&P , NPO status , Patient's Chart, lab work & pertinent test results  Airway Mallampati: II  TM Distance: >3 FB Neck ROM: Full    Dental no notable dental hx. (+) Dental Advisory Given   Pulmonary neg pulmonary ROS   Pulmonary exam normal        Cardiovascular negative cardio ROS Normal cardiovascular exam     Neuro/Psych negative neurological ROS  negative psych ROS   GI/Hepatic negative GI ROS, Neg liver ROS,,,IBS   Endo/Other  Hypothyroidism    Renal/GU Renal diseaseHx renal Ca  negative genitourinary   Musculoskeletal negative musculoskeletal ROS (+)    Abdominal   Peds negative pediatric ROS (+)  Hematology negative hematology ROS (+)   Anesthesia Other Findings   Reproductive/Obstetrics negative OB ROS                              Anesthesia Physical Anesthesia Plan  ASA: 3  Anesthesia Plan: General   Post-op Pain Management: Tylenol PO (pre-op)* and Toradol IV (intra-op)*   Induction: Intravenous  PONV Risk Score and Plan: 4 or greater and Ondansetron, Dexamethasone, Midazolam and Scopolamine patch - Pre-op  Airway Management Planned: Oral ETT  Additional Equipment:   Intra-op Plan:   Post-operative Plan: Extubation in OR  Informed Consent: I have reviewed the patients History and Physical, chart, labs and discussed the procedure including the risks, benefits and alternatives for the proposed anesthesia with the patient or authorized representative who has indicated his/her understanding and acceptance.     Dental advisory given  Plan Discussed with: CRNA and Anesthesiologist  Anesthesia Plan Comments:          Anesthesia Quick Evaluation

## 2022-10-11 NOTE — Anesthesia Procedure Notes (Signed)
Procedure Name: Intubation Date/Time: 10/11/2022 12:23 PM  Performed by: Griffin Dakin, CRNAPre-anesthesia Checklist: Patient identified, Emergency Drugs available, Suction available and Patient being monitored Patient Re-evaluated:Patient Re-evaluated prior to induction Oxygen Delivery Method: Circle system utilized Preoxygenation: Pre-oxygenation with 100% oxygen Induction Type: IV induction Ventilation: Mask ventilation without difficulty Laryngoscope Size: Mac and 4 Grade View: Grade I Tube type: Oral Tube size: 7.0 mm Number of attempts: 1 Airway Equipment and Method: Stylet and Oral airway Placement Confirmation: ETT inserted through vocal cords under direct vision, positive ETCO2 and breath sounds checked- equal and bilateral Secured at: 22 cm Tube secured with: Tape Dental Injury: Teeth and Oropharynx as per pre-operative assessment

## 2022-10-11 NOTE — Transfer of Care (Signed)
Immediate Anesthesia Transfer of Care Note  Patient: [name]  Procedure(s) Performed: OPEN REDUCTION INTERNAL FIXATION OF THORACIC SIX PATHOLOGIC FRACTURE (Spine Thoracic)  Patient Location: PACU  Anesthesia Type:General  Level of Consciousness: sedated  Airway & Oxygen Therapy: Patient Spontanous Breathing and Patient connected to face mask oxygen  Post-op Assessment: Report given to RN and Post -op Vital signs reviewed and stable  Post vital signs: Reviewed and stable  Last Vitals:  Vitals Value Taken Time  BP    Temp    Pulse 85 10/11/22 1552  Resp 13 10/11/22 1552  SpO2 97 % 10/11/22 1552  Vitals shown include unvalidated device data.  Last Pain:  Vitals:   10/11/22 0852  TempSrc:   PainSc: 0-No pain      Patients Stated Pain Goal: 0 ([id number])  Complications: No notable events documented.

## 2022-10-12 ENCOUNTER — Encounter (HOSPITAL_COMMUNITY): Payer: Self-pay | Admitting: Neurosurgery

## 2022-10-12 MED ORDER — OXYCODONE HCL 5 MG PO TABS: 5.0000 mg | ORAL_TABLET | Freq: Four times a day (QID) | ORAL | 0 refills | Status: AC | PRN

## 2022-10-12 MED ORDER — TIZANIDINE HCL 4 MG PO TABS: 4.0000 mg | ORAL_TABLET | Freq: Four times a day (QID) | ORAL | 0 refills | Status: DC | PRN

## 2022-10-12 NOTE — Progress Notes (Signed)
Patient discharging to parent's home. Vital signs stable at time of discharge. Discharge instructions given and verbal understanding returned. No questions at this time. Waiting for ride and bedside commode to be delivered.

## 2022-10-12 NOTE — Care Management (Signed)
    Durable Medical Equipment  (From admission, onward)           Start     Ordered   10/12/22 1207  For home use only DME Bedside commode  Once       Question:  Patient needs a bedside commode to treat with the following condition  Answer:  Pathologic thoracic fracture, initial encounter   10/12/22 1208           Patient is confined to one room and cannot safely ambulate to the bathroom

## 2022-10-12 NOTE — Discharge Instructions (Signed)

## 2022-10-12 NOTE — Evaluation (Signed)
Occupational Therapy Evaluation Patient Details Name: Dana Hanson MRN: [id number] DOB: 06/19/1961 Today's Date: 10/12/2022   History of Present Illness 62 y.o. female presented 3/27 with T6 pathological fracture. 3/27 T4-8 stabilization surgery  PMH-stage IV renal cell carcinoma with metastasis at T6; Rt nephrectomy; arthritis; hypothyroidism; IBS   Clinical Impression   Pt completed toilet transfers with supervision using the RW for support.  Supervision to modified independent for simulated selfcare sit to stand.  She will have initial supervision at discharge and will borrow any DME (walker, shower seat, 3:1).  No further acute care OT needs at this time.        Recommendations for follow up therapy are one component of a multi-disciplinary discharge planning process, led by the attending physician.  Recommendations may be updated based on patient status, additional functional criteria and insurance authorization.   Assistance Recommended at Discharge PRN  Patient can return home with the following A little help with bathing/dressing/bathroom;Assist for transportation;Assistance with cooking/housework    Functional Status Assessment  Patient has not had a recent decline in their functional status  Equipment Recommendations  None recommended by OT (Pt has access to RW, 3:1, and shower seat)       Precautions / Restrictions Precautions Precautions: Back Precaution Booklet Issued: Yes (comment) Precaution Comments: reviewed precautions related to mobility Required Braces or Orthoses:  (no brace) Restrictions Weight Bearing Restrictions: No      Mobility Bed Mobility   Bed Mobility: Sit to Sidelying         Sit to sidelying: Supervision      Transfers Overall transfer level: Needs assistance Equipment used: Rolling walker (2 wheels) Transfers: Sit to/from Stand Sit to Stand: Supervision                  Balance Overall balance assessment: Needs  assistance Sitting-balance support: No upper extremity supported, Feet supported Sitting balance-Leahy Scale: Good     Standing balance support: Reliant on assistive device for balance, During functional activity Standing balance-Leahy Scale: Fair Standing balance comment: Pt uses RW for safety with mobility but can standing without use of an assistive device.                           ADL either performed or assessed with clinical judgement   ADL Overall ADL's : Needs assistance/impaired Eating/Feeding: Independent;Sitting   Grooming: Standing;Supervision/safety;Oral care   Upper Body Bathing: Set up;Sitting Upper Body Bathing Details (indicate cue type and reason): simulated Lower Body Bathing: Supervison/ safety;Sit to/from stand Lower Body Bathing Details (indicate cue type and reason): simulated Upper Body Dressing : Set up;Sitting Upper Body Dressing Details (indicate cue type and reason): simulated Lower Body Dressing: Supervision/safety;Sit to/from stand Lower Body Dressing Details (indicate cue type and reason): simulated Toilet Transfer: Supervision/safety;Ambulation;Grab bars;Comfort height toilet;Rolling walker (2 wheels)   Toileting- Clothing Manipulation and Hygiene: Supervision/safety;Sit to/from stand       Functional mobility during ADLs: Supervision/safety;Rolling walker (2 wheels) General ADL Comments: Pt able to state 3/3 back precautions.  Practiced toilet transfer and feel pt will benefit from 3:1 at home to increase height and give arm rests to help with sit to stand.  Also recommend shower seat initially.  Pt reports that she can borrow both from family/friends.  She will stay with her parents for a few days before going back home where she lives with her sons.  Discussed and practiced walker safety with recommendation for a reacher  and walker bag.     Vision Baseline Vision/History: 1 Wears glasses Ability to See in Adequate Light: 0  Adequate Patient Visual Report: No change from baseline Vision Assessment?: No apparent visual deficits     Perception Not tested   Praxis  Not tested    Pertinent Vitals/Pain Pain Assessment Pain Assessment: 0-10 Pain Score: 7  Pain Location: back Pain Descriptors / Indicators: Guarding Pain Intervention(s): Limited activity within patient's tolerance, Repositioned     Hand Dominance  right   Extremity/Trunk Assessment Upper Extremity Assessment Upper Extremity Assessment: Overall WFL for tasks assessed   Lower Extremity Assessment Lower Extremity Assessment: Defer to PT evaluation   Cervical / Trunk Assessment Cervical / Trunk Assessment: Back Surgery   Communication Communication Communication: No difficulties   Cognition Arousal/Alertness: Awake/alert Behavior During Therapy: WFL for tasks assessed/performed Overall Cognitive Status: Within Functional Limits for tasks assessed                                                  Home Living Family/patient expects to be discharged to:: Private residence Living Arrangements: Children (77 yo sons; work days--going to stay with her parents; set-up information for their home) Available Help at Discharge: Family;Available 24 hours/day Type of Home: House Home Access: Ramped entrance     Home Layout: One level     Bathroom Shower/Tub: Occupational psychologist: Handicapped height Bathroom Accessibility: Yes   Home Equipment: None (but can borrow equipment from friends)   Additional Comments: home set-up for parents' home      Prior Functioning/Environment Prior Level of Function : Independent/Modified Independent;Working/employed;Driving (working from home)                         AM-PAC OT "6 Clicks" Daily Activity     Outcome Measure Help from another person eating meals?: None Help from another person taking care of personal grooming?: None Help from another person  toileting, which includes using toliet, bedpan, or urinal?: A Little Help from another person bathing (including washing, rinsing, drying)?: A Little Help from another person to put on and taking off regular upper body clothing?: None Help from another person to put on and taking off regular lower body clothing?: A Little 6 Click Score: 21   End of Session Equipment Utilized During Treatment: Rolling walker (2 wheels) Nurse Communication: Mobility status  Activity Tolerance: Patient tolerated treatment well Patient left: in bed;with call bell/phone within reach                   Time: [id number] OT Time Calculation (min): 41 min Charges:  OT General Charges $OT Visit: 1 Visit OT Evaluation $OT Eval Moderate Complexity: 1 Mod OT Treatments $Self Care/Home Management : 23-37 mins Clyda Greener, OTR/L [place]  Office [phone] 10/12/2022

## 2022-10-12 NOTE — TOC Transition Note (Signed)
Transition of Care [facility]) - CM/SW Discharge Note   Patient Details  Name: TASMA RAYBON MRN: [id number] Date of Birth: 1961/03/02  Transition of Care [facility]) CM/SW Contact:  Verdell Carmine, RN Phone Number: 10/12/2022, 12:13 PM   Clinical Narrative:      Patient needs BSC, narrative placed, ordered through adapt.  No further needs Identified       Patient Goals and CMS Choice      Discharge Placement                         Discharge Plan and Services Additional resources added to the After Visit Summary for                  DME Arranged: Bedside commode DME Agency: [employer] Date DME Agency Contacted: 10/12/22 Time DME Agency Contacted: 71 Representative spoke with at DME Agency: Bernard (Olga) Interventions SDOH Screenings   Tobacco Use: Low Risk  (10/11/2022)     Readmission Risk Interventions     No data to display

## 2022-10-12 NOTE — Evaluation (Signed)
Physical Therapy Evaluation Patient Details Name: Dana Hanson MRN: [id number] DOB: 06-Dec-1960 Today's Date: 10/12/2022  History of Present Illness  62 y.o. female presented 3/27 with T6 pathological fracture. 3/27 T4-8 stabilization surgery  PMH-stage IV renal cell carcinoma with metastasis at T6; Rt nephrectomy; arthritis; hypothyroidism; IBS  Clinical Impression   Patient is s/p above surgery resulting in functional limitations due to the deficits listed below (see PT Problem List). Patient lives with 2 sons (5+ yrs old) and was independent with all mobility PTA. Sons work days and pt plans to go stay with her parents in their handicap-accessible home. She has multiple sources she can borrow DME from if needed.  Patient will benefit from acute skilled PT to increase their independence and safety with mobility to facilitate discharge.        Recommendations for follow up therapy are one component of a multi-disciplinary discharge planning process, led by the attending physician.  Recommendations may be updated based on patient status, additional functional criteria and insurance authorization.  Follow Up Recommendations       Assistance Recommended at Discharge PRN  Patient can return home with the following  Assistance with cooking/housework;Assist for transportation;Help with stairs or ramp for entrance    Equipment Recommendations None recommended by PT (may borrow RW if helpful)  Recommendations for Other Services  OT consult    Functional Status Assessment Patient has had a recent decline in their functional status and demonstrates the ability to make significant improvements in function in a reasonable and predictable amount of time.     Precautions / Restrictions Precautions Precautions: Back (instructed for comfort) Precaution Booklet Issued: Yes (comment) Precaution Comments: reviewed precautions related to mobility Required Braces or Orthoses:  (no  brace) Restrictions Weight Bearing Restrictions: No      Mobility  Bed Mobility Overal bed mobility: Needs Assistance Bed Mobility: Rolling, Sidelying to Sit Rolling: Supervision Sidelying to sit: Supervision       General bed mobility comments: vc for technique to minimize twisting    Transfers Overall transfer level: Needs assistance Equipment used: None Transfers: Sit to/from Stand Sit to Stand: Min guard           General transfer comment: from EOB and from recliner; cues for technique    Ambulation/Gait Ambulation/Gait assistance: Min guard Gait Distance (Feet): 80 Feet Assistive device: None Gait Pattern/deviations: Step-through pattern, Decreased stride length, Wide base of support   Gait velocity interpretation: 1.31 - 2.62 ft/sec, indicative of limited community ambulator   General Gait Details: slow, guarded gait; not significantly antalgic however may benefit from support of RW (none present in room; obtained one for OT to try with pt)  Stairs            Wheelchair Mobility    Modified Rankin (Stroke Patients Only)       Balance Overall balance assessment: Needs assistance Sitting-balance support: No upper extremity supported, Feet supported Sitting balance-Leahy Scale: Good Sitting balance - Comments: able to sit figure 4 for donning underwear with assist   Standing balance support: No upper extremity supported Standing balance-Leahy Scale: Fair                               Pertinent Vitals/Pain Pain Assessment Pain Assessment: 0-10 Pain Score: 6  Pain Location: back Pain Descriptors / Indicators: Operative site guarding, Spasm Pain Intervention(s): Limited activity within patient's tolerance, Monitored during session, Premedicated before session, Repositioned  Home Living Family/patient expects to be discharged to:: Private residence Living Arrangements: Children (37 yo sons; work days--going to stay with her  parents; set-up information for their home) Available Help at Discharge: Family;Available 24 hours/day Type of Home: House Home Access: Ramped entrance       Home Layout: One level Home Equipment: None (but can borrow equipment from friends) Additional Comments: home set-up for parents' home    Prior Function Prior Level of Function : Independent/Modified Independent;Working/employed;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, newspaper        Extremity/Trunk Assessment   Upper Extremity Assessment Upper Extremity Assessment: Defer to OT evaluation    Lower Extremity Assessment Lower Extremity Assessment: Overall WFL for tasks assessed    Cervical / Trunk Assessment Cervical / Trunk Assessment: Kyphotic  Communication   Communication: No difficulties  Cognition Arousal/Alertness: Awake/alert Behavior During Therapy: WFL for tasks assessed/performed Overall Cognitive Status: Within Functional Limits for tasks assessed                                          General Comments      Exercises     Assessment/Plan    PT Assessment Patient needs continued PT services  PT Problem List Decreased range of motion;Decreased balance;Decreased mobility;Decreased knowledge of use of DME;Decreased knowledge of precautions;Pain       PT Treatment Interventions DME instruction;Gait training;Functional mobility training;Therapeutic activities;Patient/family education    PT Goals (Current goals can be found in the Care Plan section)  Acute Rehab PT Goals Patient Stated Goal: reduce pain PT Goal Formulation: With patient Time For Goal Achievement: 10/26/22 Potential to Achieve Goals: Good    Frequency Min 5X/week     Co-evaluation               AM-PAC PT "6 Clicks" Mobility  Outcome Measure Help needed turning from your back to your side while in a flat bed without using bedrails?: A Little Help needed moving from lying on your  back to sitting on the side of a flat bed without using bedrails?: A Little Help needed moving to and from a bed to a chair (including a wheelchair)?: A Little Help needed standing up from a chair using your arms (e.g., wheelchair or bedside chair)?: A Little Help needed to walk in hospital room?: A Little Help needed climbing 3-5 steps with a railing? : A Little 6 Click Score: 18    End of Session Equipment Utilized During Treatment: Gait belt Activity Tolerance: Patient tolerated treatment well Patient left: in chair;with call bell/phone within reach;with chair alarm set Nurse Communication: Mobility status PT Visit Diagnosis: Pain;Difficulty in walking, not elsewhere classified (R26.2) Pain - Right/Left:  (midline) Pain - part of body:  (back)    Time: [id number] PT Time Calculation (min) (ACUTE ONLY): 28 min   Charges:   PT Evaluation $PT Eval Low Complexity: 1 Low PT Treatments $Gait Training: 8-22 mins         [place]  Office [phone]   Rexanne Mano 10/12/2022, 10:27 AM

## 2022-10-12 NOTE — Discharge Summary (Signed)
  BP 117/68 (BP Location: Left Arm)   Pulse 94   Temp 97.8 F (36.6 C) (Oral)   Resp 17   Ht 5\' 7"  (1.702 m)   Wt 80.7 kg   LMP 10/27/2013   SpO2 98%   BMI 27.88 kg/m  Physician Discharge Summary  Patient ID: Dana Hanson MRN: [id number] DOB/AGE: 09/06/1960 62 y.o.  Admit date: 10/11/2022 Discharge date: 10/12/2022  Admission Diagnoses:pathologic thoracic fracture, renal clear cell CA, Thoracic Six  Discharge Diagnoses: same Principal Problem:   Pathologic thoracic fracture, sequela   Discharged Condition: good  Hospital Course: Dana Hanson was admitted, taken to the operating room and underwent an open reduction and internal fixation . Post op she is ambulating, voiding, and tolerating a regular diet. Wound is clean, dry, no signs of infection. Discharge home.  Treatments: surgery: ORIF T4-T8 percutaneous pedicle screw placement(stryker)  Discharge Exam: Blood pressure 117/68, pulse 94, temperature 97.8 F (36.6 C), temperature source Oral, resp. rate 17, height 5\' 7"  (1.702 m), weight 80.7 kg, last menstrual period 10/27/2013, SpO2 98 %. General appearance: alert, cooperative, appears stated age, and mild distress  Disposition: Discharge disposition: 01-Home or Self Care      Compression fracture of T6 vertebra  Allergies as of 10/12/2022   No Known Allergies      Medication List     STOP taking these medications    KEYTRUDA IV       TAKE these medications    acetaminophen 500 MG tablet Commonly known as: TYLENOL Take 500-1,000 mg by mouth every 6 (six) hours as needed (pain.).   levothyroxine 50 MCG tablet Commonly known as: SYNTHROID Take 50 mcg by mouth daily before breakfast.   multivitamin with minerals Tabs tablet Take 1 tablet by mouth in the morning.   oxyCODONE 5 MG immediate release tablet Commonly known as: Roxicodone Take 1 tablet (5 mg total) by mouth every 6 (six) hours as needed for up to 8 days for severe pain.    pregabalin 75 MG capsule Commonly known as: LYRICA Take 75 mg by mouth 3 (three) times daily.   tiZANidine 4 MG tablet Commonly known as: ZANAFLEX Take 1 tablet (4 mg total) by mouth every 6 (six) hours as needed for muscle spasms.   triamcinolone cream 0.1 % Commonly known as: KENALOG Apply 1 Application topically daily as needed (skin rash/irritation.).         SignedAshok Pall 10/12/2022, 10:46 AM

## 2022-10-16 ENCOUNTER — Inpatient Hospital Stay: Payer: Managed Care, Other (non HMO)

## 2022-10-17 ENCOUNTER — Other Ambulatory Visit: Payer: Self-pay

## 2022-10-23 ENCOUNTER — Inpatient Hospital Stay: Payer: Managed Care, Other (non HMO)

## 2022-10-23 ENCOUNTER — Encounter: Payer: Self-pay | Admitting: Internal Medicine

## 2022-10-24 ENCOUNTER — Encounter: Payer: Self-pay | Admitting: Internal Medicine

## 2022-10-31 NOTE — Op Note (Signed)
10/11/2022  11:26 AM  PATIENT:  Dana Hanson  62 y.o. female  PRE-OPERATIVE DIAGNOSIS:  pathologic Compression fracture of T6 vertebra  POST-OPERATIVE DIAGNOSIS:  Patholologic Compression fracture of T6 vertebra  PROCEDURE:  Procedure(s): OPEN REDUCTION INTERNAL FIXATION OF THORACIC SIX PATHOLOGIC FRACTURE  Pedicle screw fixation T4-T8, percutaneous, stryker, segmental  SURGEON: Surgeon(s): Coletta Memos, MD Tressie Stalker, MD  ASSISTANTS:Jenkins, Tinnie Gens  ANESTHESIA:   general  EBL:  No intake/output data recorded.  BLOOD ADMINISTERED:none  CELL SAVER GIVEN:not used  COUNT:per nursing  DRAINS: none   SPECIMEN:  No Specimen  DICTATION: AMITA ATAYDE was taken to the operating room, intubated, and placed under a general anesthetic without difficulty. A foley catheter was placed under sterile conditions. She was positioned prone on a Jackson table with all pressure points properly padded. Her back was prepped and draped in a sterile manner.  Using fluoroscopy I determined the entry sites for screw placement at T4,5,7,and 8 biaterally. I infiltrated lidocaine at each position, then made a small linear incision to accommodate the tower. I used a Jamshidi needle to cannulate the pedicle, and placed a guidewire into the pedicle. Once confirmed on ap and lateral views I removed the Jamshidi. I placed the pedicle screws over the wire and confirmed their location with ap and lateral views.  Once all the screws were placed I placed a rod on each side. I locked the rod into proper position. Once more confirmed location with the fluoroscopy.  I removed the towers and closed.  I closed each incision with vicryl sutures in layers. I used dermabond for dressings at each site.  She was then turned supine on the OR stretcher, extubated, and taken to the PACU.  PLAN OF CARE: Admit to inpatient   PATIENT DISPOSITION:  PACU - hemodynamically stable.   Delay start of Pharmacological  VTE agent (>24hrs) due to surgical blood loss or risk of bleeding:  no

## 2022-11-29 ENCOUNTER — Encounter (INDEPENDENT_AMBULATORY_CARE_PROVIDER_SITE_OTHER): Payer: 59 | Admitting: Ophthalmology

## 2022-11-29 DIAGNOSIS — H35373 Puckering of macula, bilateral: Secondary | ICD-10-CM | POA: Diagnosis not present

## 2022-11-29 DIAGNOSIS — H43813 Vitreous degeneration, bilateral: Secondary | ICD-10-CM

## 2022-11-30 ENCOUNTER — Other Ambulatory Visit: Payer: Self-pay

## 2022-12-13 ENCOUNTER — Ambulatory Visit
Admission: RE | Admit: 2022-12-13 | Discharge: 2022-12-13 | Disposition: A | Discharge status: Home / Self Care | Payer: 59 | Source: Ambulatory Visit | Attending: Internal Medicine | Admitting: Internal Medicine

## 2022-12-13 DIAGNOSIS — C649 Malignant neoplasm of unspecified kidney, except renal pelvis: Secondary | ICD-10-CM | POA: Diagnosis present

## 2022-12-13 DIAGNOSIS — C641 Malignant neoplasm of right kidney, except renal pelvis: Secondary | ICD-10-CM

## 2022-12-13 LAB — GLUCOSE, CAPILLARY: Glucose-Capillary: 83 mg/dL (ref 70–99)

## 2022-12-13 MED ORDER — FLUDEOXYGLUCOSE F - 18 (FDG) INJECTION
9.2000 | Freq: Once | INTRAVENOUS | Status: AC | PRN
  Administered 2022-12-13: 9.88 via INTRAVENOUS

## 2022-12-28 ENCOUNTER — Telehealth: Payer: Self-pay | Admitting: Internal Medicine

## 2022-12-28 ENCOUNTER — Encounter: Payer: Self-pay | Admitting: Internal Medicine

## 2022-12-28 NOTE — Telephone Encounter (Signed)
Patient is aware of upcoming appointment changes and new times/dates.

## 2023-01-01 ENCOUNTER — Encounter: Payer: Self-pay | Admitting: Internal Medicine

## 2023-01-03 ENCOUNTER — Encounter: Payer: Self-pay | Admitting: Internal Medicine

## 2023-01-04 ENCOUNTER — Other Ambulatory Visit: Payer: Self-pay

## 2023-01-04 ENCOUNTER — Other Ambulatory Visit: Payer: Managed Care, Other (non HMO)

## 2023-01-04 ENCOUNTER — Inpatient Hospital Stay: Payer: BC Managed Care – PPO | Attending: Oncology

## 2023-01-04 DIAGNOSIS — C641 Malignant neoplasm of right kidney, except renal pelvis: Secondary | ICD-10-CM | POA: Diagnosis not present

## 2023-01-04 DIAGNOSIS — C7951 Secondary malignant neoplasm of bone: Secondary | ICD-10-CM | POA: Insufficient documentation

## 2023-01-04 LAB — CMP (CANCER CENTER ONLY)
ALT: 6 U/L (ref 0–44)
AST: 15 U/L (ref 15–41)
Albumin: 3.9 g/dL (ref 3.5–5.0)
Alkaline Phosphatase: 58 U/L (ref 38–126)
Anion gap: 4 — ABNORMAL LOW (ref 5–15)
BUN: 17 mg/dL (ref 6–20)
CO2: 28 mmol/L (ref 22–32)
Calcium: 9.4 mg/dL (ref 8.9–10.3)
Chloride: 110 mmol/L (ref 98–111)
Creatinine: 1.21 mg/dL — ABNORMAL HIGH (ref 0.44–1.00)
GFR, Estimated: 51 mL/min — ABNORMAL LOW (ref >60)
Glucose, Bld: 95 mg/dL (ref 70–99)
Potassium: 4.4 mmol/L (ref 3.5–5.1)
Sodium: 142 mmol/L (ref 135–145)
Total Bilirubin: 0.6 mg/dL (ref 0.3–1.2)
Total Protein: 6.7 g/dL (ref 6.5–8.1)

## 2023-01-04 LAB — CBC WITH DIFFERENTIAL (CANCER CENTER ONLY)
Abs Immature Granulocytes: 0.01 10*3/uL (ref 0.00–0.07)
Basophils Absolute: 0.1 10*3/uL (ref 0.0–0.1)
Basophils Relative: 1 %
Eosinophils Absolute: 0.2 10*3/uL (ref 0.0–0.5)
Eosinophils Relative: 5 %
HCT: 36.8 % (ref 36.0–46.0)
Hemoglobin: 11.9 g/dL — ABNORMAL LOW (ref 12.0–15.0)
Immature Granulocytes: 0 %
Lymphocytes Relative: 27 %
Lymphs Abs: 1.2 10*3/uL (ref 0.7–4.0)
MCH: 28.7 pg (ref 26.0–34.0)
MCHC: 32.3 g/dL (ref 30.0–36.0)
MCV: 88.9 fL (ref 80.0–100.0)
Monocytes Absolute: 0.4 10*3/uL (ref 0.1–1.0)
Monocytes Relative: 9 %
Neutro Abs: 2.5 10*3/uL (ref 1.7–7.7)
Neutrophils Relative %: 58 %
Platelet Count: 212 10*3/uL (ref 150–400)
RBC: 4.14 MIL/uL (ref 3.87–5.11)
RDW: 13.9 % (ref 11.5–15.5)
WBC Count: 4.3 10*3/uL (ref 4.0–10.5)
nRBC: 0 % (ref 0.0–0.2)

## 2023-01-08 ENCOUNTER — Inpatient Hospital Stay (HOSPITAL_BASED_OUTPATIENT_CLINIC_OR_DEPARTMENT_OTHER): Payer: BC Managed Care – PPO | Admitting: Internal Medicine

## 2023-01-08 DIAGNOSIS — C649 Malignant neoplasm of unspecified kidney, except renal pelvis: Secondary | ICD-10-CM

## 2023-01-08 DIAGNOSIS — C7951 Secondary malignant neoplasm of bone: Secondary | ICD-10-CM

## 2023-01-08 DIAGNOSIS — E032 Hypothyroidism due to medicaments and other exogenous substances: Secondary | ICD-10-CM | POA: Diagnosis not present

## 2023-01-08 NOTE — Progress Notes (Signed)
[facility] [phone]   Fax:([phone]) [phone]  PROGRESS NOTE FOR TELEMEDICINE VISITS  [name], MD [address] [place] [address]  I connected withNAME@ on 01/08/23 at  3:45 PM EDT by telephone visit and verified that I am speaking with the correct person using two identifiers.   I discussed the limitations, risks, security and privacy concerns of performing an evaluation and management service by telemedicine and the availability of in-person appointments. I also discussed with the patient that there may be a patient responsible charge related to this service. The patient expressed understanding and agreed to proceed.  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   Initially diagnosed with localized disease in 2017.    PRIOR THERAPY: 1) She is status post laparoscopic right radical nephrectomy performed in February 2017.  She was found to have 6.5 cm clear-cell renal cell carcinoma.   2) She is status post surgical biopsy completed by Dr. Franky Macho on September 09, 2021 of the T6 spinal lesion which confirmed the presence of metastatic renal cell carcinoma.   3) She is status post spinal stereotactic radiosurgery completed on September 27, 2021 for isolated metastatic lesion of the T6 spine. 4) Pembrolizumab 200 mg every 3 weeks cycle 1 started on Nov 18, 2021.  She received 400 mg starting with cycle 4 on January 20, 2022. The plan is to complete total of 1 year with the last treatment was given on October 05, 2022.  CURRENT THERAPY: Observation.  INTERVAL HISTORY: Dana Hanson 62 y.o. female has a virtual telephone visit with me today for evaluation and discussion of her recent lab  and scan results.  The patient is feeling fine today with no concerning complaints except for twisting her ankle recently.  She denied having any current chest pain, shortness of breath, cough or hemoptysis.  She has no nausea, vomiting, diarrhea or constipation.  She has no headache or visual changes.  She denied having any fever or chills.  She has no recent weight loss or night sweats.  She completed 1 year treatment with Keytruda in March 2024 and she is currently on observation.  She had repeat blood work as well as PET scan performed recently and we are having the visit for discussion of these results.  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  MEDICATIONS:  Current Outpatient Medications  Medication Sig Dispense Refill   acetaminophen (TYLENOL) 500 MG tablet Take 500-1,000 mg by mouth every 6 (six) hours as needed (pain.).     levothyroxine (SYNTHROID) 50 MCG tablet Take 50 mcg by mouth daily before breakfast.     Multiple Vitamin (MULITIVITAMIN WITH MINERALS) TABS Take 1 tablet by mouth in the morning.     pregabalin (LYRICA) 75 MG capsule Take 75 mg by mouth 3 (three) times daily.     tiZANidine (ZANAFLEX) 4 MG tablet Take 1 tablet (  4 mg total) by mouth every 6 (six) hours as needed for muscle spasms. 60 tablet 0   triamcinolone cream (KENALOG) 0.1 % Apply 1 Application topically daily as needed (skin rash/irritation.).     No current facility-administered medications for this visit.    SURGICAL HISTORY:  Past Surgical History:  Procedure Laterality Date   CESAREAN SECTION     x 2-tubal with last c-section   CHOLECYSTECTOMY N/A 09/03/2015   Procedure: LAPAROSCOPIC CHOLECYSTECTOMY WITH INTRAOPERATIVE CHOLANGIOGRAM;   Surgeon: Rodman Pickle, MD;  Location: [facility];  Service: General;  Laterality: N/A;   COLONOSCOPY  08/14/2011   Surgeon: Corbin Ade, MD; external hemorrhoid tags, likely source of hematochezia, normal rectum, multiple colonic polyps at the base of the cecum, all less than 5 mm, single 4 mm sigmoid polyp, scattered diverticula.  Pathology revealed tubular adenoma in the cecum, hyperplastic polyp in the sigmoid colon.  Recommended repeat colonoscopy in 3 years.   COLONOSCOPY WITH PROPOFOL N/A 11/30/2021   Procedure: COLONOSCOPY WITH PROPOFOL;  Surgeon: Corbin Ade, MD;  [facility]: [facility];  Service: Endoscopy;  Laterality: N/A;  1:45pm   HEMORRHOID SURGERY  11/10/2011   Procedure: HEMORRHOIDECTOMY;  Surgeon: Fabio Bering, MD;  Location: [facility];  Service: General;  Laterality: N/A;   LAPAROSCOPIC GASTRIC SLEEVE RESECTION  01/15/2015   LAPAROSCOPIC NEPHRECTOMY N/A 09/03/2015   Procedure: LAPAROSCOPIC RADICAL NEPHRECTOMY;  Surgeon: Crist Fat, MD;  Location: [facility];  Service: Urology;  Laterality: N/A;   LUMBAR PERCUTANEOUS PEDICLE SCREW 4 LEVEL N/A 10/11/2022   Procedure: OPEN REDUCTION INTERNAL FIXATION OF THORACIC SIX PATHOLOGIC FRACTURE;  Surgeon: Coletta Memos, MD;  Location: [facility];  Service: Neurosurgery;  Laterality: N/A;   POLYPECTOMY  11/30/2021   Procedure: POLYPECTOMY;  Surgeon: Corbin Ade, MD;  [facility];  Service: Endoscopy;;   TUBAL LIGATION     with last c-section   VERTEBROPLASTY N/A 09/09/2021   Procedure: Thoracic vertebral biopsy;  Surgeon: Coletta Memos, MD;  Location[facility];  Service: Neurosurgery;  Laterality: N/A;  RM 21    REVIEW OF SYSTEMS:  Constitutional: positive for fatigue Eyes: negative Ears, nose, mouth, throat, and face: negative Respiratory: negative Cardiovascular: negative Gastrointestinal: negative Genitourinary:negative Integument/breast: negative Hematologic/lymphatic:  negative Musculoskeletal:negative Neurological: negative Behavioral/Psych: negative Endocrine: negative Allergic/Immunologic: negative    LABORATORY DATA: Lab Results  Component Value Date   WBC 4.3 01/04/2023   HGB 11.9 (L) 01/04/2023   HCT 36.8 01/04/2023   MCV 88.9 01/04/2023   PLT 212 01/04/2023      Chemistry      Component Value Date/Time   NA 142 01/04/2023 0903   K 4.4 01/04/2023 0903   CL 110 01/04/2023 0903   CO2 28 01/04/2023 0903   BUN 17 01/04/2023 0903   CREATININE 1.21 (H) 01/04/2023 0903      Component Value Date/Time   CALCIUM 9.4 01/04/2023 0903   ALKPHOS 58 01/04/2023 0903   AST 15 01/04/2023 0903   ALT 6 01/04/2023 0903   BILITOT 0.6 01/04/2023 0903       RADIOGRAPHIC STUDIES: NM PET Image Restage (PS) Skull Base to Thigh (F-18 FDG)  Result Date: 12/18/2022 CLINICAL DATA:  Subsequent treatment strategy for renal cell carcinoma. EXAM: NUCLEAR MEDICINE PET SKULL BASE TO THIGH TECHNIQUE: 9.9 mCi F-18 FDG was injected intravenously. Full-ring PET imaging was performed from the skull base to thigh after the radiotracer. CT data was obtained and used for attenuation correction and anatomic localization. Fasting  blood glucose: 83 mg/dl COMPARISON:  PET-CT 07/19/7251 FINDINGS: Mediastinal blood pool activity: SUV max 2.8 Liver activity: SUV max NA NECK: No hypermetabolic lymph nodes in the neck. Incidental CT findings: None. CHEST: No hypermetabolic mediastinal or hilar nodes. No suspicious pulmonary nodules on the CT scan. Incidental CT findings: None. ABDOMEN/PELVIS: No abnormal hypermetabolic activity within the liver, pancreas, adrenal glands, or spleen. No hypermetabolic lymph nodes in the abdomen or pelvis. Interval resolution of the previously described metabolic activity in proximal stomach. Post RIGHT nephrectomy. No nodularity or abnormal metabolic activity in the nephrectomy bed. Incidental CT findings: None. SKELETON: Interval posterior thoracic fusion  spanning the T6 pathologic fracture. Moderate metabolic activity remains within the T6 vertebral bodies not changed from prior. No new sites skeletal metastasis. Incidental CT findings: None. IMPRESSION: 1. No evidence of renal cell carcinoma recurrence in the RIGHT nephrectomy bed. 2. No evidence of new metastatic renal cell carcinoma. 3. Interval posterior thoracic fusion spanning the T6 pathologic fracture. Moderate metabolic activity remains within the T6 vertebral body. Electronically Signed   By: Genevive Bi M.D.   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   Initially diagnosed with localized disease in 2017.  She is status post the following treatment: 1) She is status post laparoscopic right radical nephrectomy performed in February 2017.  She was found to have 6.5 cm clear-cell renal cell carcinoma. 2) She is status post surgical biopsy completed by Dr. Franky Macho on September 09, 2021 of the T6 spinal lesion which confirmed the presence of metastatic renal cell carcinoma. 3) She is status post spinal stereotactic radiosurgery completed on September 27, 2021 for isolated metastatic lesion of the T6 spine. 4) Pembrolizumab 200 mg every 3 weeks cycle 1 started on Nov 18, 2021.  She received 400 mg starting with cycle 4 on January 20, 2022. The plan is to complete total of 1 year with the last treatment was given on October 05, 2022. The patient is currently on observation and she is feeling fine. She had repeat PET scan as well as blood work performed recently.  I personally and independently reviewed her imaging studies and discussed the result with the patient today. Her PET scan showed no concerning findings for disease progression. I recommended for her to continue on observation with repeat CT scan  of the chest, abdomen and pelvis in 6 months. For the hypothyroidism, I will arrange for the patient to have repeat TSH and T4 next week and we will adjust her medication accordingly. I will see her back for follow-up visit in 6 months but she was advised to call immediately if she has any other concerning symptoms in the interval. I discussed the assessment and treatment plan with the patient. The patient was provided an opportunity to ask questions and all were answered. The patient agreed with the plan and demonstrated an understanding of the instructions.   The patient was advised to call back or seek an in-person evaluation if the symptoms worsen or if the condition fails to improve as anticipated.  I provided 22 minutes of non face-to-face telephone visit time during this encounter, and > 50% was spent counseling as documented under my assessment & plan.  Lajuana Matte, MD 01/08/2023 1:17 PM  Disclaimer: This note was dictated with voice  recognition software. Similar sounding words can inadvertently be transcribed and may not be corrected upon review.

## 2023-01-09 ENCOUNTER — Telehealth: Payer: Self-pay | Admitting: Internal Medicine

## 2023-01-09 NOTE — Telephone Encounter (Signed)
Called patient regarding July and December appointments, left a voicemail.

## 2023-01-15 ENCOUNTER — Inpatient Hospital Stay: Payer: 59 | Attending: Oncology

## 2023-01-15 DIAGNOSIS — C641 Malignant neoplasm of right kidney, except renal pelvis: Secondary | ICD-10-CM | POA: Diagnosis present

## 2023-01-15 DIAGNOSIS — C7951 Secondary malignant neoplasm of bone: Secondary | ICD-10-CM | POA: Diagnosis not present

## 2023-01-15 DIAGNOSIS — C649 Malignant neoplasm of unspecified kidney, except renal pelvis: Secondary | ICD-10-CM

## 2023-01-15 DIAGNOSIS — Z79899 Other long term (current) drug therapy: Secondary | ICD-10-CM | POA: Insufficient documentation

## 2023-01-15 DIAGNOSIS — E032 Hypothyroidism due to medicaments and other exogenous substances: Secondary | ICD-10-CM

## 2023-01-15 LAB — TSH: TSH: 26.297 u[IU]/mL — ABNORMAL HIGH (ref 0.350–4.500)

## 2023-01-16 LAB — T4: T4, Total: 5.6 ug/dL (ref 4.5–12.0)

## 2023-01-19 ENCOUNTER — Ambulatory Visit (INDEPENDENT_AMBULATORY_CARE_PROVIDER_SITE_OTHER): Payer: BC Managed Care – PPO

## 2023-01-19 ENCOUNTER — Ambulatory Visit
Admission: EM | Admit: 2023-01-19 | Discharge: 2023-01-19 | Disposition: A | Discharge status: Home / Self Care | Payer: BC Managed Care – PPO | Attending: Nurse Practitioner | Admitting: Nurse Practitioner

## 2023-01-19 DIAGNOSIS — S8261XA Displaced fracture of lateral malleolus of right fibula, initial encounter for closed fracture: Secondary | ICD-10-CM | POA: Diagnosis not present

## 2023-01-19 NOTE — Discharge Instructions (Addendum)
The x-ray suggest that you do have a small avulsion fracture of the right ankle. Continue over-the-counter analgesics such as Tylenol or ibuprofen as needed for pain or discomfort. Continue RICE therapy, rest, ice, compression, and elevation.  Also recommend continuing use of your boot to provide more stability and support. It is recommended that you follow-up with orthopedics.  I would like for you to follow-up with Dr. Romeo Apple within the next 48 to 72 hours for reevaluation. Follow-up as needed.

## 2023-01-19 NOTE — ED Triage Notes (Signed)
Pt c/o Right ankle pain pt states she stepped into a hole and twisted her ankle, 2 weeks ago. Pt is limping, states it hurts to bare weight on it. Ankle is swollen.   Pt states she has been using the Rice method and taking tylenol as need for pain. Swelling has not completely gone down.

## 2023-01-19 NOTE — ED Provider Notes (Signed)
[facility]    CSN: [id number] Arrival date & time: 01/19/23  0930      History   Chief Complaint No chief complaint on file.   HPI Dana Hanson is a 62 y.o. female.   The history is provided by the patient.   The patient presents for right ankle pain.  Symptoms started approximately 2 weeks ago after she stepped into a hole and rolled her right ankle.  She states since that time, symptoms initially improved to include swelling, and pain.  She states that she continues to have mild swelling to the outside of the left ankle.  She states that she wants to make sure she does not have a "hairline fracture".  Patient denies numbness, tingling, bruising, erythema, radiation of pain or inability to bear weight.  Patient states that she has been using a boot and also wearing a brace along with implementing RICE therapy techniques for her symptoms.  Past Medical History:  Diagnosis Date   Arthritis    right knee   Cancer (HCC)    kidney   Gallstone pancreatitis    Hyperlipidemia    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   IBS (irritable bowel syndrome)    patient denies   Right renal mass     Patient Active Problem List   Diagnosis Date Noted   Pathologic thoracic fracture, sequela 10/11/2022   Encounter for antineoplastic immunotherapy 08/24/2022   Closed fracture of proximal end of left humerus with routine healing 01/25/2022   History of colonic polyps 10/12/2021   Abnormal PET scan of colon 10/12/2021   Metastatic renal cell carcinoma to bone (HCC) 09/27/2021   Cancer of right kidney (HCC) 09/03/2015   Renal mass 09/03/2015   Pancreatitis 08/21/2015   Biliary obstruction    Primary osteoarthritis of right knee 03/17/2014   Hemorrhoids 08/01/2011   Heme positive stool 08/01/2011   Rectal bleed 08/01/2011    Past Surgical History:  Procedure Laterality Date   CESAREAN SECTION     x 2-tubal with last c-section   CHOLECYSTECTOMY N/A 09/03/2015   Procedure:  LAPAROSCOPIC CHOLECYSTECTOMY WITH INTRAOPERATIVE CHOLANGIOGRAM;  Surgeon: Rodman Pickle, MD;  Location: [facility];  Service: General;  Laterality: N/A;   COLONOSCOPY  08/14/2011   Surgeon: Corbin Ade, MD; external hemorrhoid tags, likely source of hematochezia, normal rectum, multiple colonic polyps at the base of the cecum, all less than 5 mm, single 4 mm sigmoid polyp, scattered diverticula.  Pathology revealed tubular adenoma in the cecum, hyperplastic polyp in the sigmoid colon.  Recommended repeat colonoscopy in 3 years.   COLONOSCOPY WITH PROPOFOL N/A 11/30/2021   Procedure: COLONOSCOPY WITH PROPOFOL;  Surgeon: Corbin Ade, MD;  [facility];  Service: Endoscopy;  Laterality: N/A;  1:45pm   HEMORRHOID SURGERY  11/10/2011   Procedure: HEMORRHOIDECTOMY;  Surgeon: Fabio Bering, MD;  Location: [facility];  Service: General;  Laterality: N/A;   LAPAROSCOPIC GASTRIC SLEEVE RESECTION  01/15/2015   LAPAROSCOPIC NEPHRECTOMY N/A 09/03/2015   Procedure: LAPAROSCOPIC RADICAL NEPHRECTOMY;  Surgeon: Crist Fat, MD;  Location: [facility];  Service: Urology;  Laterality: N/A;   LUMBAR PERCUTANEOUS PEDICLE SCREW 4 LEVEL N/A 10/11/2022   Procedure: OPEN REDUCTION INTERNAL FIXATION OF THORACIC SIX PATHOLOGIC FRACTURE;  Surgeon: Coletta Memos, MD;  Location: [facility];  Service: Neurosurgery;  Laterality: N/A;   POLYPECTOMY  11/30/2021   Procedure: POLYPECTOMY;  Surgeon: Corbin Ade, MD;  [facility];  Service:  Endoscopy;;   TUBAL LIGATION     with last c-section   VERTEBROPLASTY N/A 09/09/2021   Procedure: Thoracic vertebral biopsy;  Surgeon: Coletta Memos, MD;  Location: [facility];  Service: Neurosurgery;  Laterality: N/A;  RM 21    OB History   No obstetric history on file.      Home Medications    Prior to Admission medications   Medication Sig Start Date End Date Taking? Authorizing Provider  acetaminophen (TYLENOL) 500 MG tablet Take 500-1,000 mg by mouth every 6  (six) hours as needed (pain.).    [provider]  levothyroxine (SYNTHROID) 50 MCG tablet Take 50 mcg by mouth daily before breakfast. 07/17/22   [provider]  Multiple Vitamin (MULITIVITAMIN WITH MINERALS) TABS Take 1 tablet by mouth in the morning.    [provider]  pregabalin (LYRICA) 75 MG capsule Take 75 mg by mouth 3 (three) times daily. 07/22/21   [provider]  tiZANidine (ZANAFLEX) 4 MG tablet Take 1 tablet (4 mg total) by mouth every 6 (six) hours as needed for muscle spasms. 10/12/22   Coletta Memos, MD  triamcinolone cream (KENALOG) 0.1 % Apply 1 Application topically daily as needed (skin rash/irritation.). 08/16/22   [provider]    Family History Family History  Adopted: Yes  Problem Relation Age of Onset   Colon cancer Neg Hx    Liver disease Neg Hx    Anesthesia problems Neg Hx    Hypotension Neg Hx    Malignant hyperthermia Neg Hx    Pseudochol deficiency Neg Hx     Social History Social History   Tobacco Use   Smoking status: Never   Smokeless tobacco: Never  Vaping Use   Vaping Use: Never used  Substance Use Topics   Alcohol use: No   Drug use: No     Allergies   Patient has no known allergies.   Review of Systems Review of Systems Per HPI  Physical Exam Triage Vital Signs ED Triage Vitals  Enc Vitals Group     BP 01/19/23 0950 135/84     Pulse Rate 01/19/23 0950 84     Resp 01/19/23 0950 16     Temp 01/19/23 0950 97.9 F (36.6 C)     Temp Source 01/19/23 0950 Oral     SpO2 01/19/23 0950 97 %     Weight --      Height --      Head Circumference --      Peak Flow --      Pain Score 01/19/23 0955 3     Pain Loc --      Pain Edu? --      Excl. in GC? --    No data found.  Updated Vital Signs BP 135/84 (BP Location: Right Arm)   Pulse 84   Temp 97.9 F (36.6 C) (Oral)   Resp 16   LMP 10/27/2013   SpO2 97%   Visual Acuity Right Eye Distance:   Left Eye Distance:   Bilateral  Distance:    Right Eye Near:   Left Eye Near:    Bilateral Near:     Physical Exam Vitals and nursing note reviewed.  Constitutional:      General: She is not in acute distress.    Appearance: Normal appearance.  HENT:     Head: Normocephalic.  Eyes:     Extraocular Movements: Extraocular movements intact.     Pupils: Pupils are equal, round,  and reactive to light.  Pulmonary:     Effort: Pulmonary effort is normal.  Musculoskeletal:     Cervical back: Normal range of motion.     Right lower leg: Normal.     Right ankle: Swelling (Swelling noted to the lateral aspect of the right malleolus.) present. No deformity or ecchymosis. Tenderness (Right lateral malleolus) present over the lateral malleolus. Normal range of motion. Normal pulse.     Right foot: Normal.     Comments: Cap refill < 2 sec, +2 DP/PT pulses  Skin:    General: Skin is warm and dry.  Neurological:     General: No focal deficit present.     Mental Status: She is alert and oriented to person, place, and time.  Psychiatric:        Mood and Affect: Mood normal.        Behavior: Behavior normal.      UC Treatments / Results  Labs (all labs ordered are listed, but only abnormal results are displayed) Labs Reviewed - No data to display  EKG   Radiology DG Ankle Complete Right  Result Date: 01/19/2023 CLINICAL DATA:  Twisted ankle stepping in a hole 2 weeks ago. Persistent lateral pain. EXAM: RIGHT ANKLE - COMPLETE 3+ VIEW COMPARISON:  None Available. FINDINGS: Small sliver of bone lies just below the inferior margin of the distal fibula, best appreciated on the AP view, consistent with a small avulsion fracture. There is an adjacent well corticated bone density that is presumed chronic likely an accessory ossification center. Another similar appearing bone density lies just inferior to the medial malleolus. No other evidence of a fracture. Ankle joint is normally spaced and aligned. Moderate-sized plantar  calcaneal spur. Soft tissue swelling noted, most prominent laterally. IMPRESSION: 1. Small sliver of bone lies just inferior to the distal fibula consistent with an acute/recent avulsion fracture. 2. No other evidence of a recent fracture. Normally aligned ankle joint. Electronically Signed   By: Amie Portland M.D.   On: 01/19/2023 10:24    Procedures Procedures (including critical care time)  Medications Ordered in UC Medications - No data to display  Initial Impression / Assessment and Plan / UC Course  I have reviewed the triage vital signs and the nursing notes.  Pertinent labs & imaging results that were available during my care of the patient were reviewed by me and considered in my medical decision making (see chart for details).  The patient is well-appearing, she is in no acute distress, vital signs are stable.  X-ray of the right ankle suggest an avulsion fracture inferior to the distal fibula.  Fracture appreciated more on the AP view.  Patient has an ankle brace and ankle boot.  Will have her continue to use these devices.  Patient is also implementing RICE therapy, will also have her continue.  Will recommend follow-up with orthopedics, patient states that she does see Dr. Romeo Apple.  Patient will follow-up within the next 48 to 72 hours for reevaluation.  Patient is in agreement with this plan of care and verbalizes understanding.  All questions were answered.  Patient stable for discharge.  Final Clinical Impressions(s) / UC Diagnoses   Final diagnoses:  Closed avulsion fracture of lateral malleolus of right fibula, initial encounter     Discharge Instructions      The x-ray suggest that you do have a small avulsion fracture of the right ankle. Continue over-the-counter analgesics such as Tylenol or ibuprofen as needed for pain or  discomfort. Continue RICE therapy, rest, ice, compression, and elevation.  Also recommend continuing use of your boot to provide more stability  and support. It is recommended that you follow-up with orthopedics.  I would like for you to follow-up with Dr. Romeo Apple within the next 48 to 72 hours for reevaluation. Follow-up as needed.     ED Prescriptions   None    PDMP not reviewed this encounter.   Abran Cantor, NP 01/19/23 1038

## 2023-02-14 ENCOUNTER — Encounter: Payer: Self-pay | Admitting: Orthopedic Surgery

## 2023-02-14 ENCOUNTER — Ambulatory Visit: Payer: BC Managed Care – PPO | Admitting: Orthopedic Surgery

## 2023-02-14 VITALS — BP 108/73 | HR 103 | Ht 67.0 in | Wt 178.0 lb

## 2023-02-14 DIAGNOSIS — S82839A Other fracture of upper and lower end of unspecified fibula, initial encounter for closed fracture: Secondary | ICD-10-CM | POA: Diagnosis not present

## 2023-02-14 NOTE — Progress Notes (Signed)
Chief Complaint  Patient presents with   Ankle Injury    Right injury approx. June 21st    This is a 62 year old female presents to [place] with right ankle injury June 21.  She went to the hospital for x-rays on July 5 due to persistent pain and swelling eventually wearing a boot.  She became concerned because she still having pain and swelling and is seeking orthopedic advice about her right ankle injury  Her x-ray shows an avulsion fracture without any arthritis or disruption of the ankle mortise soft tissue swelling was also noted  Review of systems currently nothing to add  Past Medical History:  Diagnosis Date   Arthritis    right knee   Cancer (HCC)    kidney   Gallstone pancreatitis    Hyperlipidemia    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   IBS (irritable bowel syndrome)    patient denies   Right renal mass    BP 108/73   Pulse (!) 103   Ht 5\' 7"  (1.702 m)   Wt 178 lb (80.7 kg)   LMP 10/27/2013   BMI 27.88 kg/m   Physical Exam Vitals and nursing note reviewed.  Constitutional:      Appearance: Normal appearance.  HENT:     Head: Normocephalic and atraumatic.  Eyes:     General: No scleral icterus.       Right eye: No discharge.        Left eye: No discharge.     Extraocular Movements: Extraocular movements intact.     Conjunctiva/sclera: Conjunctivae normal.     Pupils: Pupils are equal, round, and reactive to light.  Cardiovascular:     Rate and Rhythm: Normal rate.     Pulses: Normal pulses.  Skin:    General: Skin is warm and dry.     Capillary Refill: Capillary refill takes less than 2 seconds.  Neurological:     General: No focal deficit present.     Mental Status: She is alert and oriented to person, place, and time.  Psychiatric:        Mood and Affect: Mood normal.        Behavior: Behavior normal.        Thought Content: Thought content normal.        Judgment: Judgment normal.    Right ankle has some lateral swelling compared to the left the  skin is intact the range of motion is normal the anterior drawer test is normal as well there is tenderness over the ATFL near the fibular attachment posterior fibula is negative for tenderness or involvement of the peroneal tendons  Imaging of the ankle from the outside facility shows an avulsion fracture with intact ankle mortise   Encounter Diagnosis  Name Primary?   Avulsion fracture of distal fibula right ankl June 21 Yes   Assessment and plan avulsion fracture right ankle recommend ankle range of motion and strengthening exercises and ankle support sleeve  Follow-up as needed

## 2023-02-26 ENCOUNTER — Encounter: Payer: BC Managed Care – PPO | Admitting: Orthopedic Surgery

## 2023-03-30 ENCOUNTER — Ambulatory Visit (HOSPITAL_COMMUNITY)
Admission: RE | Admit: 2023-03-30 | Discharge: 2023-03-30 | Disposition: A | Discharge status: Home / Self Care | Payer: BC Managed Care – PPO | Source: Ambulatory Visit | Attending: Orthopedic Surgery | Admitting: Orthopedic Surgery

## 2023-03-30 ENCOUNTER — Other Ambulatory Visit: Payer: Self-pay | Admitting: Orthopedic Surgery

## 2023-03-30 DIAGNOSIS — Z981 Arthrodesis status: Secondary | ICD-10-CM | POA: Insufficient documentation

## 2023-03-30 NOTE — Progress Notes (Signed)
The patient had a spinal fusion for tumor  She called the doctor's office and could not get an appointment until October 8  She is having moderate to severe back pain acute in onset thought to be secondary to lifting a heavy object but has persisted for over 6 weeks  X-ray ordered to evaluate the fusion and to look for any new pathologic lesions

## 2023-04-08 ENCOUNTER — Encounter (HOSPITAL_COMMUNITY): Payer: Self-pay

## 2023-04-08 ENCOUNTER — Emergency Department (HOSPITAL_COMMUNITY): Payer: BC Managed Care – PPO

## 2023-04-08 ENCOUNTER — Other Ambulatory Visit: Payer: Self-pay

## 2023-04-08 ENCOUNTER — Emergency Department (HOSPITAL_COMMUNITY)
Admission: EM | Admit: 2023-04-08 | Discharge: 2023-04-08 | Disposition: A | Discharge status: Home / Self Care | Payer: BC Managed Care – PPO | Attending: Emergency Medicine | Admitting: Emergency Medicine

## 2023-04-08 DIAGNOSIS — E039 Hypothyroidism, unspecified: Secondary | ICD-10-CM | POA: Insufficient documentation

## 2023-04-08 DIAGNOSIS — Z79899 Other long term (current) drug therapy: Secondary | ICD-10-CM | POA: Diagnosis not present

## 2023-04-08 DIAGNOSIS — Z85528 Personal history of other malignant neoplasm of kidney: Secondary | ICD-10-CM | POA: Diagnosis not present

## 2023-04-08 DIAGNOSIS — C799 Secondary malignant neoplasm of unspecified site: Secondary | ICD-10-CM

## 2023-04-08 DIAGNOSIS — M546 Pain in thoracic spine: Secondary | ICD-10-CM | POA: Insufficient documentation

## 2023-04-08 DIAGNOSIS — M549 Dorsalgia, unspecified: Secondary | ICD-10-CM | POA: Diagnosis present

## 2023-04-08 LAB — COMPREHENSIVE METABOLIC PANEL
ALT: 10 U/L (ref 0–44)
AST: 15 U/L (ref 15–41)
Albumin: 4 g/dL (ref 3.5–5.0)
Alkaline Phosphatase: 50 U/L (ref 38–126)
Anion gap: 8 (ref 5–15)
BUN: 22 mg/dL — ABNORMAL HIGH (ref 6–20)
CO2: 25 mmol/L (ref 22–32)
Calcium: 9.2 mg/dL (ref 8.9–10.3)
Chloride: 106 mmol/L (ref 98–111)
Creatinine, Ser: 1.17 mg/dL — ABNORMAL HIGH (ref 0.44–1.00)
GFR, Estimated: 53 mL/min — ABNORMAL LOW (ref >60)
Glucose, Bld: 92 mg/dL (ref 70–99)
Potassium: 4.4 mmol/L (ref 3.5–5.1)
Sodium: 139 mmol/L (ref 135–145)
Total Bilirubin: 0.7 mg/dL (ref 0.3–1.2)
Total Protein: 7.1 g/dL (ref 6.5–8.1)

## 2023-04-08 LAB — CBC WITH DIFFERENTIAL/PLATELET
Abs Immature Granulocytes: 0.01 10*3/uL (ref 0.00–0.07)
Basophils Absolute: 0.1 10*3/uL (ref 0.0–0.1)
Basophils Relative: 1 %
Eosinophils Absolute: 0.1 10*3/uL (ref 0.0–0.5)
Eosinophils Relative: 3 %
HCT: 37.5 % (ref 36.0–46.0)
Hemoglobin: 11.9 g/dL — ABNORMAL LOW (ref 12.0–15.0)
Immature Granulocytes: 0 %
Lymphocytes Relative: 25 %
Lymphs Abs: 1.2 10*3/uL (ref 0.7–4.0)
MCH: 27.9 pg (ref 26.0–34.0)
MCHC: 31.7 g/dL (ref 30.0–36.0)
MCV: 87.8 fL (ref 80.0–100.0)
Monocytes Absolute: 0.4 10*3/uL (ref 0.1–1.0)
Monocytes Relative: 7 %
Neutro Abs: 3.1 10*3/uL (ref 1.7–7.7)
Neutrophils Relative %: 64 %
Platelets: 242 10*3/uL (ref 150–400)
RBC: 4.27 MIL/uL (ref 3.87–5.11)
RDW: 14.6 % (ref 11.5–15.5)
WBC: 4.9 10*3/uL (ref 4.0–10.5)
nRBC: 0 % (ref 0.0–0.2)

## 2023-04-08 MED ORDER — TIZANIDINE HCL 4 MG PO TABS: 4.0000 mg | ORAL_TABLET | Freq: Four times a day (QID) | ORAL | 0 refills | Status: DC | PRN

## 2023-04-08 MED ORDER — OXYCODONE-ACETAMINOPHEN 5-325 MG PO TABS
1.0000 | ORAL_TABLET | Freq: Four times a day (QID) | ORAL | 0 refills | Status: AC | PRN
Start: 2023-04-08 — End: ?

## 2023-04-08 MED ORDER — GADOBUTROL 1 MMOL/ML IV SOLN
8.0000 mL | Freq: Once | INTRAVENOUS | Status: AC | PRN
  Administered 2023-04-08: 8 mL via INTRAVENOUS

## 2023-04-08 MED ORDER — HYDROMORPHONE HCL 1 MG/ML IJ SOLN
0.5000 mg | Freq: Once | INTRAMUSCULAR | Status: AC
  Administered 2023-04-08: 0.5 mg via INTRAVENOUS
  Filled 2023-04-08: qty 0.5

## 2023-04-08 MED ORDER — MELOXICAM 15 MG PO TABS: 15.0000 mg | ORAL_TABLET | Freq: Every day | ORAL | 0 refills | Status: DC

## 2023-04-08 MED ORDER — SODIUM CHLORIDE 0.9 % IV SOLN: INTRAVENOUS | Status: DC

## 2023-04-08 MED ORDER — ONDANSETRON HCL 4 MG/2ML IJ SOLN
4.0000 mg | Freq: Once | INTRAMUSCULAR | Status: AC
  Administered 2023-04-08: 4 mg via INTRAVENOUS
  Filled 2023-04-08: qty 2

## 2023-04-08 MED ORDER — IOHEXOL 300 MG/ML  SOLN
100.0000 mL | Freq: Once | INTRAMUSCULAR | Status: AC | PRN
  Administered 2023-04-08: 100 mL via INTRAVENOUS

## 2023-04-08 NOTE — ED Notes (Signed)
Patient transported to CT 

## 2023-04-08 NOTE — ED Provider Notes (Addendum)
[place] EMERGENCY DEPARTMENT AT [facility] Provider Note   CSN: [id number] Arrival date & time: 04/08/23  1042     History  Chief Complaint  Patient presents with   Back Pain    Dana Hanson is a 62 y.o. female.  Patient with history of stage IV renal cell carcinoma.  Patient had isolated area of metastasis T6 documented back in February 2023.  First part of this year patient did have surgery done by Dr. Franky Macho to stabilize the area of metastasis and bone collapse.  Also notes last seen by oncology in March of this year patient was status post the biopsy in February 2023 for the T6 spinal lesion as it was status post stereotactic radiosurgery completed on September 28, 2018 for isolated metastatic lesion of the T6 spine.  Patient was treated with immunotherapy in March of this year her scans were negative and she was deemed cured her in remission.  Not due to be seen again until December.  About a month ago patient started with pain in that same area kind of the mid thoracic area around the T6 area.  No lower extremity numbness weakness or any incontinence.  Patient does have pain medicine at home.  But it is not helping.  She is just worried that this could be something else.  Past medical history otherwise significant for hyperlipidemia irritable bowel syndrome hypothyroidism due to [facility].  Arthritis right knee.  Patient is never used tobacco products.       Home Medications Prior to Admission medications   Medication Sig Start Date End Date Taking? Authorizing Provider  acetaminophen (TYLENOL) 500 MG tablet Take 500-1,000 mg by mouth every 6 (six) hours as needed (pain.).    [provider]  levothyroxine (SYNTHROID) 50 MCG tablet Take 50 mcg by mouth daily before breakfast. 07/17/22   [provider]  Multiple Vitamin (MULITIVITAMIN WITH MINERALS) TABS Take 1 tablet by mouth in the morning.    [provider]  pregabalin (LYRICA) 75 MG  capsule Take 75 mg by mouth 3 (three) times daily. 07/22/21   [provider]  tiZANidine (ZANAFLEX) 4 MG tablet Take 1 tablet (4 mg total) by mouth every 6 (six) hours as needed for muscle spasms. 10/12/22   Coletta Memos, MD  triamcinolone cream (KENALOG) 0.1 % Apply 1 Application topically daily as needed (skin rash/irritation.). 08/16/22   [provider]      Allergies    Patient has no known allergies.    Review of Systems   Review of Systems  Constitutional:  Negative for chills and fever.  HENT:  Negative for ear pain and sore throat.   Eyes:  Negative for pain and visual disturbance.  Respiratory:  Negative for cough and shortness of breath.   Cardiovascular:  Negative for chest pain and palpitations.  Gastrointestinal:  Negative for abdominal pain and vomiting.  Genitourinary:  Negative for difficulty urinating, dysuria and hematuria.  Musculoskeletal:  Positive for back pain. Negative for arthralgias and neck pain.  Skin:  Negative for color change and rash.  Neurological:  Negative for dizziness, seizures, syncope, facial asymmetry, speech difficulty, weakness, numbness and headaches.  All other systems reviewed and are negative.   Physical Exam Updated Vital Signs BP 124/83   Pulse 80   Temp 98.7 F (37.1 C) (Oral)   Wt 80 kg   LMP 10/27/2013   SpO2 100%   BMI 27.62 kg/m  Physical Exam Vitals and nursing note reviewed.  Constitutional:      General: She is not in acute distress.    Appearance: Normal appearance. She is well-developed.  HENT:     Head: Normocephalic and atraumatic.  Eyes:     Conjunctiva/sclera: Conjunctivae normal.  Cardiovascular:     Rate and Rhythm: Normal rate and regular rhythm.     Heart sounds: No murmur heard. Pulmonary:     Effort: Pulmonary effort is normal. No respiratory distress.     Breath sounds: Normal breath sounds.  Abdominal:     Palpations: Abdomen is soft.     Tenderness: There is no abdominal  tenderness.  Musculoskeletal:        General: No swelling.     Cervical back: Neck supple.     Comments: Mild tenderness to palpation somewhere around mid thoracic spine.  Skin:    General: Skin is warm and dry.     Capillary Refill: Capillary refill takes less than 2 seconds.  Neurological:     General: No focal deficit present.     Mental Status: She is alert and oriented to person, place, and time.     Cranial Nerves: No cranial nerve deficit.     Sensory: No sensory deficit.     Motor: No weakness.  Psychiatric:        Mood and Affect: Mood normal.     ED Results / Procedures / Treatments   Labs (all labs ordered are listed, but only abnormal results are displayed) Labs Reviewed  CBC WITH DIFFERENTIAL/PLATELET  COMPREHENSIVE METABOLIC PANEL    EKG None  Radiology No results found.  Procedures Procedures    Medications Ordered in ED Medications  0.9 %  sodium chloride infusion (has no administration in time range)  ondansetron (ZOFRAN) injection 4 mg (has no administration in time range)  HYDROmorphone (DILAUDID) injection 0.5 mg (has no administration in time range)    ED Course/ Medical Decision Making/ A&P                                 Medical Decision Making Amount and/or Complexity of Data Reviewed Labs: ordered. Radiology: ordered.  Risk Prescription drug management.   Based on this we will get CT thoracic spine with and without contrast.  Will get basic labs.  Will go and do complete metabolic panel because of her past history.  History of the renal cell carcinoma.  If negative may proceed to MRI for further evaluation.  Concern is for recurrence in the thoracic spine where she was known to have metastatic renal cell carcinoma.  It appeared that was mostly bony in the past.  CBC no leukocytosis hemoglobin good at 11.9.  Platelets are normal at 242 complete metabolic panel BUN 22 creatinine 1.17 for GFR 53 electrolytes normal.  No liver  function test abnormalities.  CT scan of the thoracic spine shows pathological fracture T6 with approximate 50% vertebral body height loss similar to previous exams.  However there is increased soft tissue density posterior to the posterior wall of the T6 vertebrae narrowing the thoracic canal at this level there is also increased soft tissue density in the para soft tissues adjacent to the T6 vertebrae all findings are concerning for tumor recurrence.  They are recommending further evaluation with contrast-enhanced MRI of the thoracic spine is recommended to assess for potential canal stenosis of level and also for recurrence of mets.  Patient will be informed of this.  MRI has been ordered.     Final Clinical Impression(s) / ED Diagnoses Final diagnoses:  Acute midline thoracic back pain    Rx / DC Orders ED Discharge Orders     None         Vanetta Mulders, MD 04/08/23 1302    Vanetta Mulders, MD 04/08/23 [phone]

## 2023-04-08 NOTE — ED Notes (Signed)
Pt assisted to the restroom

## 2023-04-08 NOTE — ED Notes (Signed)
Patient transported to MRI 

## 2023-04-08 NOTE — ED Provider Notes (Signed)
I discussed with NP - Burns - they will call her in the AM tomorrow -the patient has been informed, she is stable for discharge, the MRI report was shared with the patient, she is aware that she has some recurrent tumor most likely.  Labs have been unremarkable otherwise, vital signs been unremarkable, she is not neurologically compromised at this time.   Eber Hong, MD 04/08/23 [phone]

## 2023-04-08 NOTE — ED Triage Notes (Signed)
C/o intermittent upper back pain x1 month.  Patient reports xray on 9/13 Hx of spinal fusion in march for tumor.  Denies urinary/bowel incontinence.

## 2023-04-08 NOTE — Discharge Instructions (Signed)
Testing shows that you likely have a recurrent tumor in your vertebra, this will need to be seen by the cancer doctors and I have talked to them on the phone.  They will call you for [name] in the morning to make an appointment to be seen a soon as possible in the clinic.  I have also refilled some of your chronic pain medications including the muscle relaxer tizanidine, an anti-inflammatory called Mobic and the pain medication called Percocet, please use these sparingly, ER for worsening symptoms

## 2023-04-10 ENCOUNTER — Telehealth: Payer: Self-pay | Admitting: Internal Medicine

## 2023-04-10 ENCOUNTER — Telehealth: Payer: Self-pay | Admitting: Medical Oncology

## 2023-04-10 NOTE — Telephone Encounter (Signed)
Scheduled per scheduling message, patient has been called and notified of 09/25 appointments.

## 2023-04-10 NOTE — Telephone Encounter (Signed)
"   My cancer has returned " per  [facility] ED .She requests appt. with [facility]. Appt 09/25.

## 2023-04-11 ENCOUNTER — Inpatient Hospital Stay: Payer: BC Managed Care – PPO | Attending: Oncology

## 2023-04-11 ENCOUNTER — Inpatient Hospital Stay (HOSPITAL_BASED_OUTPATIENT_CLINIC_OR_DEPARTMENT_OTHER): Payer: BC Managed Care – PPO | Admitting: Internal Medicine

## 2023-04-11 VITALS — BP 132/79 | HR 82 | Temp 97.8°F | Resp 17 | Ht 67.0 in | Wt 188.7 lb

## 2023-04-11 DIAGNOSIS — C641 Malignant neoplasm of right kidney, except renal pelvis: Secondary | ICD-10-CM | POA: Diagnosis present

## 2023-04-11 DIAGNOSIS — Z905 Acquired absence of kidney: Secondary | ICD-10-CM | POA: Insufficient documentation

## 2023-04-11 DIAGNOSIS — C7951 Secondary malignant neoplasm of bone: Secondary | ICD-10-CM | POA: Diagnosis not present

## 2023-04-11 DIAGNOSIS — C649 Malignant neoplasm of unspecified kidney, except renal pelvis: Secondary | ICD-10-CM | POA: Diagnosis not present

## 2023-04-11 NOTE — Progress Notes (Signed)
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   Initially diagnosed with localized disease in 2017.    PRIOR THERAPY: 1) She is status post laparoscopic right radical nephrectomy performed in February 2017.  She was found to have 6.5 cm clear-cell renal cell carcinoma.   2) She is status post surgical biopsy completed by Dr. Franky Macho on September 09, 2021 of the T6 spinal lesion which confirmed the presence of metastatic renal cell carcinoma.   3) She is status post spinal stereotactic radiosurgery completed on September 27, 2021 for isolated metastatic lesion of the T6 spine.  4) Pembrolizumab 200 mg every 3 weeks cycle 1 started on Nov 18, 2021.  She received 400 mg starting with cycle 4 on January 20, 2022. She completed total of 1 year with the last treatment in March 2024.   CURRENT THERAPY: Observation  INTERVAL HISTORY: Dana Hanson 62 y.o. female returns to the clinic today for follow-up visit accompanied by family member.  Discussed the use of AI scribe software for clinical note transcription with the patient, who gave verbal consent to proceed.  History of Present Illness   Dana Hanson, a 62 year old patient with a history of renal cell carcinoma, completed a year of immunotherapy with Keytruda in March. Post-treatment, a PET scan showed no signs of disease. However, due to a collapsed vertebra (T6), she underwent a laparoscopic back surgery later in March, where pins and rods were inserted to support the vertebrae.  Approximately a month ago, she started experiencing severe back pain again, similar to the pain experienced prior to the surgery. An ER visit led to a CT scan, which showed some tissue on T6. An MRI  confirmed the presence of a tumor-like structure at the same location.  The patient's current pain management regimen includes Tylenol, which she reports is generally effective. The pain is most severe when changing positions, such as getting up and sitting down.  The patient also has a history of thyroid issues, reportedly caused by the [facility] treatment. She has not mentioned any other ongoing health concerns.       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  MEDICATIONS:  Current Outpatient Medications  Medication Sig Dispense Refill   pregabalin (LYRICA) 75 MG capsule Take 75 mg by mouth 3 (three) times daily.     acetaminophen (TYLENOL) 500 MG tablet Take 500-1,000 mg by mouth every 6 (six) hours as needed (pain.).     levothyroxine (SYNTHROID) 50 MCG tablet Take 50 mcg by mouth daily before breakfast.     Multiple Vitamin (MULITIVITAMIN WITH MINERALS) TABS Take 1 tablet by mouth in the morning.     oxyCODONE-acetaminophen (PERCOCET/ROXICET) 5-325 MG tablet Take 1 tablet by mouth every 6 (six) hours as needed for severe pain. 10 tablet 0   tiZANidine (ZANAFLEX) 4 MG tablet Take 1 tablet (4 mg total) by mouth  every 6 (six) hours as needed for muscle spasms. 60 tablet 0   triamcinolone cream (KENALOG) 0.1 % Apply 1 Application topically daily as needed (skin rash/irritation.).     No current facility-administered medications for this visit.    SURGICAL HISTORY:  Past Surgical History:  Procedure Laterality Date   CESAREAN SECTION     x 2-tubal with last c-section   CHOLECYSTECTOMY N/A 09/03/2015   Procedure: LAPAROSCOPIC CHOLECYSTECTOMY WITH INTRAOPERATIVE CHOLANGIOGRAM;  Surgeon: Rodman Pickle, MD;  Location:  [facility];  Service: General;  Laterality: N/A;   COLONOSCOPY  08/14/2011   Surgeon: Corbin Ade, MD; external hemorrhoid tags, likely source of hematochezia, normal rectum, multiple colonic polyps at the base of the cecum, all less than 5 mm, single 4 mm sigmoid polyp, scattered diverticula.  Pathology revealed tubular adenoma in the cecum, hyperplastic polyp in the sigmoid colon.  Recommended repeat colonoscopy in 3 years.   COLONOSCOPY WITH PROPOFOL N/A 11/30/2021   Procedure: COLONOSCOPY WITH PROPOFOL;  Surgeon: Corbin Ade, MD;  Location: [facility];  Service: Endoscopy;  Laterality: N/A;  1:45pm   HEMORRHOID SURGERY  11/10/2011   Procedure: HEMORRHOIDECTOMY;  Surgeon: Fabio Bering, MD;  Location: [facility];  Service: General;  Laterality: N/A;   LAPAROSCOPIC GASTRIC SLEEVE RESECTION  01/15/2015   LAPAROSCOPIC NEPHRECTOMY N/A 09/03/2015   Procedure: LAPAROSCOPIC RADICAL NEPHRECTOMY;  Surgeon: Crist Fat, MD;  Location: [facility];  Service: Urology;  Laterality: N/A;   LUMBAR PERCUTANEOUS PEDICLE SCREW 4 LEVEL N/A 10/11/2022   Procedure: OPEN REDUCTION INTERNAL FIXATION OF THORACIC SIX PATHOLOGIC FRACTURE;  Surgeon: Coletta Memos, MD;  Location: [facility];  Service: Neurosurgery;  Laterality: N/A;   POLYPECTOMY  11/30/2021   Procedure: POLYPECTOMY;  Surgeon: Corbin Ade, MD;  Location: [facility];  Service: Endoscopy;;   TUBAL LIGATION     with last c-section   VERTEBROPLASTY N/A 09/09/2021   Procedure: Thoracic vertebral biopsy;  Surgeon: Coletta Memos, MD;  Location: [facility] OR;  Service: Neurosurgery;  Laterality: N/A;  RM 21    REVIEW OF SYSTEMS:  Constitutional: negative Eyes: negative Ears, nose, mouth, throat, and face: negative Respiratory: negative Cardiovascular: negative Gastrointestinal: negative Genitourinary:negative Integument/breast: negative Hematologic/lymphatic: negative Musculoskeletal:positive for back pain Neurological: negative Behavioral/Psych:  negative Endocrine: negative Allergic/Immunologic: negative   PHYSICAL EXAMINATION: General appearance: alert, cooperative, fatigued, and no distress Head: Normocephalic, without obvious abnormality, atraumatic Neck: no adenopathy, no JVD, supple, symmetrical, trachea midline, and thyroid not enlarged, symmetric, no tenderness/mass/nodules Lymph nodes: Cervical, supraclavicular, and axillary nodes normal. Resp: clear to auscultation bilaterally Back: symmetric, no curvature. ROM normal. No CVA tenderness. Cardio: regular rate and rhythm, S1, S2 normal, no murmur, click, rub or gallop GI: soft, non-tender; bowel sounds normal; no masses,  no organomegaly Extremities: extremities normal, atraumatic, no cyanosis or edema Neurologic: Alert and oriented X 3, normal strength and tone. Normal symmetric reflexes. Normal coordination and gait  ECOG PERFORMANCE STATUS: 1 - Symptomatic but completely ambulatory  Blood pressure 132/79, pulse 82, temperature 97.8 F (36.6 C), temperature source Oral, resp. rate 17, height 5\' 7"  (1.702 m), weight 188 lb 11.2 oz (85.6 kg), last menstrual period 10/27/2013, SpO2 100%.  LABORATORY DATA: Lab Results  Component Value Date   WBC 4.9 04/08/2023   HGB 11.9 (L) 04/08/2023   HCT 37.5 04/08/2023   MCV 87.8 04/08/2023   PLT 242 04/08/2023      Chemistry      Component Value Date/Time   NA 139 04/08/2023 1306  K 4.4 04/08/2023 1306   CL 106 04/08/2023 1306   CO2 25 04/08/2023 1306   BUN 22 (H) 04/08/2023 1306   CREATININE 1.17 (H) 04/08/2023 1306   CREATININE 1.21 (H) 01/04/2023 0903      Component Value Date/Time   CALCIUM 9.2 04/08/2023 1306   ALKPHOS 50 04/08/2023 1306   AST 15 04/08/2023 1306   AST 15 01/04/2023 0903   ALT 10 04/08/2023 1306   ALT 6 01/04/2023 0903   BILITOT 0.7 04/08/2023 1306   BILITOT 0.6 01/04/2023 0903       RADIOGRAPHIC STUDIES: MR THORACIC SPINE W WO CONTRAST  Result Date: 04/08/2023 CLINICAL DATA:  Back  pain. History of metastatic renal cell carcinoma EXAM: MRI THORACIC WITHOUT AND WITH CONTRAST TECHNIQUE: Multiplanar and multiecho pulse sequences of the thoracic spine were obtained without and with intravenous contrast. CONTRAST:  8mL GADAVIST GADOBUTROL 1 MMOL/ML IV SOLN COMPARISON:  CT 04/08/2023, MRI 05/31/2022 FINDINGS: Alignment:  Physiologic. Vertebrae: Postsurgical changes from posterior spinal fusion spanning from T4-T8. Progressive marrow replacement of the T6 vertebral body with diffuse low T1 marrow signal throughout the vertebral body with avid enhancement on postcontrast imaging. There is significant bulging of the posterior vertebral body. Small amount of enhancing epidural tumor within the anterior epidural space at the T5 and T6 level (series 27, image 10). Enhancing extraosseous soft tissue extends into the left-sided paravertebral soft tissues that extends inferiorly to the T7 level (series 27, images 15-17). Chronic compression fracture of the T6 vertebral body without significant progression of height loss. There are no additional marrow replacing bone lesions. No additional fractures. No evidence of discitis. Cord:  Normal signal and morphology. Paraspinal and other soft tissues: Left paravertebral extraosseous tumor at the T6 and T7 levels, as above. Remaining paraspinal soft tissues are within normal limits. Disc levels: Posterior bowing of the T6 vertebral body and epidural tumor results in impress upon the ventral thecal sac, although no significant canal stenosis at this level. No significant canal or foraminal stenosis at any level within the thoracic spine. IMPRESSION: 1. Progressive marrow replacement of the T6 vertebral body with significant bulging of the posterior vertebral body and small amount of enhancing epidural tumor within the anterior epidural space at the T5 and T6 level. Enhancing extraosseous soft tissue extends into the left-sided paravertebral soft tissues that extends  inferiorly to the T7 level. Findings are compatible with known metastatic disease. 2. No significant canal stenosis or cord compression at this level. 3. Chronic compression fracture of the T6 vertebral body without significant progression of height loss. 4. No new metastatic lesions within the thoracic spine. Electronically Signed   By: Duanne Guess D.O.   On: 04/08/2023 18:38   CT THORACIC SPINE W CONTRAST  Result Date: 04/08/2023 CLINICAL DATA:  Metastatic disease evaluation Mid-back pain, prior compression fracture History of renal cell carcinoma history of bony metastasis to the T6 vertebrae in the past. Patient with pain at the same location. EXAM: CT THORACIC SPINE WITH CONTRAST TECHNIQUE: Multidetector CT images of thoracic was performed according to the standard protocol following intravenous contrast administration. RADIATION DOSE REDUCTION: This exam was performed according to the departmental dose-optimization program which includes automated exposure control, adjustment of the mA and/or kV according to patient size and/or use of iterative reconstruction technique. CONTRAST:  OMNIPAQUE IOHEXOL 300 MG/ML  SOLN COMPARISON:  CT 08/18/2022 FINDINGS: Alignment: Mildly exaggerated thoracic kyphosis. No traumatic listhesis. Vertebrae: Chronic pathologic fracture of the T6 vertebral body with approximately 50%  vertebral body height loss, similar to the previous exam. Interval posterior instrumented fusion of the midthoracic spine spanning from T4-T8. Hardware intact without evidence of loosening. No new fractures. No new lytic or sclerotic bone lesions are identified. Paraspinal and other soft tissues: Aortic and coronary artery atherosclerosis. Small hiatal hernia. Disc levels: Increased soft tissue density posterior to the posterior wall of the T6 vertebral body narrowing the thoracic canal at this level (series 7, image 84; series 11, image 50). This finding was not evident on the previous exam.  There is also increased soft tissue density in the paraspinal soft tissues adjacent to the T6 vertebral body. IMPRESSION: 1. Chronic pathologic fracture of the T6 vertebral body with approximately 50% vertebral body height loss, similar to the previous exam. 2. Increased soft tissue density posterior to the posterior wall of the T6 vertebral body narrowing the thoracic canal at this level. There is also increased soft tissue density in the paraspinal soft tissues adjacent to the T6 vertebral body. Findings are concerning for tumor recurrence. Further evaluation with contrast-enhanced MRI of the thoracic spine is recommended to assess for potential canal stenosis at this level. 3. Interval posterior instrumented fusion of the midthoracic spine spanning from T4-T8. Hardware intact without evidence of loosening. 4. Aortic and coronary artery atherosclerosis (ICD10-I70.0). Electronically Signed   By: Duanne Guess D.O.   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   Initially diagnosed with localized disease in 2017.  She is status post laparoscopic right radical nephrectomy performed in February 2017.  She was found to have 6.5 cm clear-cell renal cell carcinoma.  This was followed by surgical biopsy completed by Dr. Franky Macho on September 09, 2021 of the T6 spinal lesion which confirmed the presence of metastatic renal cell carcinoma. She also has spinal stereotactic radiosurgery completed on September 27, 2021 for isolated metastatic lesion of the T6 spine. The patient is currently on treatment with Pembrolizumab 200 mg every 3 weeks cycle 1 started on Nov 18, 2021.  She received 400 mg starting with cycle 4 on January 20, 2022.  She completed 1 year of treatment in March 2024.  She has been in observation since that  time. Assessment and Plan    T6 Vertebral Body Lesion New back pain and MRI findings suggestive of a mass at T6 vertebrae. History of renal cell carcinoma and recent completion of immunotherapy with Keytruda. -Order biopsy of T6 lesion for definitive diagnosis. -Order PET scan to assess for other potential metastatic sites. -Refer to Radiation Oncology (Dr. Kathrynn Running) for consideration of local radiation therapy.  Thyroid Dysfunction History of thyroid dysfunction post immunotherapy with Keytruda. -Order thyroid function tests at next visit.  Follow-up in 2-3 weeks with biopsy and PET scan results.   For the pain management she is currently taking Tylenol on as-needed basis.  She has narcotic medication but she does not take it at regular basis. The patient was advised to call immediately if she has any other concerning symptoms in the interval. The patient voices understanding of current disease status and treatment options and is in agreement with the current care plan.  All questions were answered. The patient knows to call the clinic with any problems, questions or concerns. We can certainly see the patient much sooner if necessary.  The total time spent in the appointment was 30 minutes.  Disclaimer: This note was dictated with voice recognition software. Similar sounding words can inadvertently be transcribed and may not be corrected upon review.

## 2023-04-12 ENCOUNTER — Encounter: Payer: Self-pay | Admitting: General Practice

## 2023-04-12 NOTE — Progress Notes (Unsigned)
Oley Balm, MD  Si Gaul, MD Cc: Caroleen Hamman, NT If this lesion is symptomatic, the pt may benefit from [facility] RF ablation at the time of vertebral biopsy. Would you like [place] to discuss this option with patient? 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

## 2023-04-12 NOTE — Progress Notes (Unsigned)
Si Gaul, MD  Oley Balm, MD Cc: Caroleen Hamman, NT Sounds good. Please discuss with the patient.       Previous Messages    ----- Message ----- From: Oley Balm, MD Sent: 04/11/2023   2:07 PM EDT To: Si Gaul, MD; Caroleen Hamman, NT Subject: RE: CT Biopsy                                  If this lesion is symptomatic, the pt may benefit from [facility] RF ablation at the time of vertebral biopsy. Would you like [place] to discuss this option with patient? 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

## 2023-04-13 ENCOUNTER — Other Ambulatory Visit: Payer: Self-pay | Admitting: Internal Medicine

## 2023-04-13 ENCOUNTER — Encounter: Payer: Self-pay | Admitting: General Practice

## 2023-04-13 DIAGNOSIS — C649 Malignant neoplasm of unspecified kidney, except renal pelvis: Secondary | ICD-10-CM

## 2023-04-13 NOTE — Progress Notes (Unsigned)
Oley Balm, MD  Francesco Sor, Katheren Shams, NT Yes needs consult 1st at clinic or hospital       Previous Messages    ----- Message ----- From: Denita Lung Sent: 04/12/2023  11:16 AM EDT To: Oley Balm, MD; Caroleen Hamman, NT Subject: Annell Greening: CT Biopsy                                  Hassell,  Are you wanting clinic to set up consult for Osteocool? ----- Message ----- From: Caroleen Hamman, NT Sent: 04/12/2023  10:59 AM EDT To: Denita Lung Subject: RE: CT Biopsy                                  Hey I think this will come to you now can you coordinate to do both in IR?   Thanks ----- Message ----- From: Si Gaul, MD Sent: 04/11/2023   8:21 PM EDT To: Oley Balm, MD; Caroleen Hamman, NT Subject: RE: CT Biopsy                                  Sounds good. Please discuss with the patient. ----- Message ----- From: Oley Balm, MD Sent: 04/11/2023   2:07 PM EDT To: Si Gaul, MD; Caroleen Hamman, NT Subject: RE: CT Biopsy                                  If this lesion is symptomatic, the pt may benefit from [facility] RF ablation at the time of vertebral biopsy. Would you like [place] to discuss this option with patient? 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

## 2023-04-16 ENCOUNTER — Inpatient Hospital Stay: Admission: RE | Admit: 2023-04-16 | Payer: 59 | Source: Ambulatory Visit

## 2023-04-17 ENCOUNTER — Ambulatory Visit
Admission: RE | Admit: 2023-04-17 | Discharge: 2023-04-17 | Disposition: A | Discharge status: Home / Self Care | Payer: BC Managed Care – PPO | Source: Ambulatory Visit | Attending: Internal Medicine | Admitting: Internal Medicine

## 2023-04-17 DIAGNOSIS — C649 Malignant neoplasm of unspecified kidney, except renal pelvis: Secondary | ICD-10-CM

## 2023-04-17 HISTORY — PX: IR RADIOLOGIST EVAL & MGMT: IMG5224

## 2023-04-17 NOTE — H&P (Signed)
Interventional Radiology - Clinic Visit, Initial H&P    Referring Provider: Si Gaul, MD  Reason for Visit: Back pain, history of T6 lesion in setting of metastatic RCC     History of Present Illness  Dana Hanson is a 62 y.o. female with a relevant past medical history of metastatic RCC seen today in Interventional Radiology clinic for progressive back pain.  Patient was seen in consultation for consideration of T6 biopsy and possible OsteoCool for treatment of symptomatic back pain related to metastatic renal cell carcinoma.  Patient was initially diagnosed with renal cancer in 2017 and is status post nephrectomy at that time which point it was thought to be local disease only.  She developed back pain in early 2023, and surgical biopsy at that time demonstrated metastatic RCC at T6.  This was followed by single session stereotactic radiosurgery in March 2023, although progressive height loss and compression fracture at T6 necessitated T4-T8 posterior fusion in March 2024.  Since that time, she has continued progressive and at times severe mid back pain.  The pain is particularly bothersome with most movements and repositioning.  She currently takes Tylenol predominantly for pain, which does reduce the pain from 7/10 to 3-4/10.  She occasionally uses a muscle relaxer, but tends to avoid any narcotic pain medicine due to her work, and trying to maintain an active lifestyle.  She does report significant disability on the [employer] disability questionnaire with 18/24 positive.    Per the patient and referring oncologist, the patient is planning to start external beam radiation therapy with Dr. Kathrynn Running, with 5 sessions planned between October 8 - October 18.    Additional Past Medical History Past Medical History:  Diagnosis Date   Arthritis    right knee   Cancer (HCC)    kidney   Gallstone pancreatitis    Hyperlipidemia    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   IBS (irritable  bowel syndrome)    patient denies   Right renal mass      Surgical History  Past Surgical History:  Procedure Laterality Date   CESAREAN SECTION     x 2-tubal with last c-section   CHOLECYSTECTOMY N/A 09/03/2015   Procedure: LAPAROSCOPIC CHOLECYSTECTOMY WITH INTRAOPERATIVE CHOLANGIOGRAM;  Surgeon: Rodman Pickle, MD;  Location: [facility];  Service: General;  Laterality: N/A;   COLONOSCOPY  08/14/2011   Surgeon: Corbin Ade, MD; external hemorrhoid tags, likely source of hematochezia, normal rectum, multiple colonic polyps at the base of the cecum, all less than 5 mm, single 4 mm sigmoid polyp, scattered diverticula.  Pathology revealed tubular adenoma in the cecum, hyperplastic polyp in the sigmoid colon.  Recommended repeat colonoscopy in 3 years.   COLONOSCOPY WITH PROPOFOL N/A 11/30/2021   Procedure: COLONOSCOPY WITH PROPOFOL;  Surgeon: Corbin Ade, MD;  Location: [facility];  Service: Endoscopy;  Laterality: N/A;  1:45pm   HEMORRHOID SURGERY  11/10/2011   Procedure: HEMORRHOIDECTOMY;  Surgeon: Fabio Bering, MD;  Location: [facility];  Service: General;  Laterality: N/A;   LAPAROSCOPIC GASTRIC SLEEVE RESECTION  01/15/2015   LAPAROSCOPIC NEPHRECTOMY N/A 09/03/2015   Procedure: LAPAROSCOPIC RADICAL NEPHRECTOMY;  Surgeon: Crist Fat, MD;  Location: [facility];  Service: Urology;  Laterality: N/A;   LUMBAR PERCUTANEOUS PEDICLE SCREW 4 LEVEL N/A 10/11/2022   Procedure: OPEN REDUCTION INTERNAL FIXATION OF THORACIC SIX PATHOLOGIC FRACTURE;  Surgeon: Coletta Memos, MD;  Location: [facility];  Service: Neurosurgery;  Laterality: N/A;  POLYPECTOMY  11/30/2021   Procedure: POLYPECTOMY;  Surgeon: Corbin Ade, MD;  Location: [facility];  Service: Endoscopy;;   TUBAL LIGATION     with last c-section   VERTEBROPLASTY N/A 09/09/2021   Procedure: Thoracic vertebral biopsy;  Surgeon: Coletta Memos, MD;  Location: [facility] OR;  Service: Neurosurgery;  Laterality: N/A;  RM 21      Medications  I have reviewed the current medication list. Refer to chart for details. Current Outpatient Medications  Medication Instructions   acetaminophen (TYLENOL) 500-1,000 mg, Oral, Every 6 hours PRN   levothyroxine (SYNTHROID) 50 mcg, Oral, Daily before breakfast   Multiple Vitamin (MULITIVITAMIN WITH MINERALS) TABS 1 tablet, Oral, Every morning   oxyCODONE-acetaminophen (PERCOCET/ROXICET) 5-325 MG tablet 1 tablet, Oral, Every 6 hours PRN   pregabalin (LYRICA) 75 mg, Oral, 3 times daily   tiZANidine (ZANAFLEX) 4 mg, Oral, Every 6 hours PRN   triamcinolone cream (KENALOG) 0.1 % 1 Application, Topical, Daily PRN      Allergies No Known Allergies Does patient have contrast allergy: No     Physical Exam Current Vitals Temp: 98.4 F (36.9 C) (Temp Source: Oral)  Pulse Rate: 80  Resp: 14  BP: 129/89  SpO2: 99 %        There is no height or weight on file to calculate BMI.  General: Alert and answers questions appropriately.  Cardiac: Regular rate. No dependent edema. Pulmonary: Normal work of breathing. On room air. Abdominal: Soft without distension.   Pertinent Lab Results    Latest Ref Rng & Units 04/08/2023    1:06 PM 01/04/2023    9:03 AM 10/05/2022    9:15 AM  CBC  WBC 4.0 - 10.5 K/uL 4.9  4.3  4.2   Hemoglobin 12.0 - 15.0 g/dL 16.1  09.6  04.5   Hematocrit 36.0 - 46.0 % 37.5  36.8  37.2   Platelets 150 - 400 K/uL 242  212  220       Latest Ref Rng & Units 04/08/2023    1:06 PM 01/04/2023    9:03 AM 10/05/2022    9:15 AM  CMP  Glucose 70 - 99 mg/dL 92  95  86   BUN 6 - 20 mg/dL 22  17  20    Creatinine 0.44 - 1.00 mg/dL 4.09  8.11  9.14   Sodium 135 - 145 mmol/L 139  142  143   Potassium 3.5 - 5.1 mmol/L 4.4  4.4  3.9   Chloride 98 - 111 mmol/L 106  110  110   CO2 22 - 32 mmol/L 25  28  26    Calcium 8.9 - 10.3 mg/dL 9.2  9.4  9.4   Total Protein 6.5 - 8.1 g/dL 7.1  6.7  6.9   Total Bilirubin 0.3 - 1.2 mg/dL 0.7  0.6  0.6   Alkaline Phos 38 -  126 U/L 50  58  53   AST 15 - 41 U/L 15  15  16    ALT 0 - 44 U/L 10  6  6        Relevant and/or Recent Imaging: PET CT pending   MRI T spine 04/08/2023  IMPRESSION: 1. Progressive marrow replacement of the T6 vertebral body with significant bulging of the posterior vertebral body and small amount of enhancing epidural tumor within the anterior epidural space at the T5 and T6 level. Enhancing extraosseous soft tissue extends into the left-sided paravertebral soft tissues that extends inferiorly to the T7 level.  Findings are compatible with known metastatic disease. 2. No significant canal stenosis or cord compression at this level. 3. Chronic compression fracture of the T6 vertebral body without significant progression of height loss. 4. No new metastatic lesions within the thoracic spine.  Electronically Signed   By: Duanne Guess D.O.   On: 04/08/2023 18:38    Assessment & Plan Dana Hanson is a 62 y.o. female with a history of metastatic RCC and previously biopsied isolated metastasis to T6 (updated PET pending) who was referred to IR Clinic by Dr. Arbutus Ped in consultation for further evaluation and management of progressive back pain.   While OsteoCool is indicated in the setting of osseous metastatic disease with symptomatic back pain, there are several factors that make treatment with percutaneous therapy challenging in this case.  The underlying tumor is RCC, which sent to be vascular lesions and therefore slightly increased the risk of having an epidural hematoma which can be a surgical emergency in a high to midthoracic vertebral bodies such as T6.  Additionally, presence of posterior fusion hardware may make cannula placement challenging or impossible.  These were discussed in detail with patient.  Given that she is proceeding with external beam radiation, which may potentially alleviate her back pain alone, we agreed to reassess her symptoms following external beam  radiation in early November.  Given that the patient has had previous surgical biopsy at T6, Dr. Arbutus Ped was agreeable to foregoing the biopsy at this time.    Plan:  Reassess back pain following external beam radiation in early November (last session tentatively scheduled for May 04 2023)  If back pain persists and pt is interested in [place], would consider doing case in CT without cement instillation since she has posterior fusion hardware stabilizing across this level. Would plan to do in hospital with general anesthesia.    Total time spent on today's visit was over 60 Minutes, including both face-to-face time and non face-to-face time, personally spent on review of chart (including labs and relevant imaging), discussing further workup and treatment options, referral to specialist if needed, reviewing outside records if pertinent, answering patient questions, and coordinating care regarding T6 metastatic disease as well as management strategy.      Olive Bass, MD  Vascular and Interventional Radiology 04/17/2023 3:13 PM

## 2023-04-18 NOTE — Progress Notes (Signed)
Radiation Oncology         [phone] ________________________________  Name: SANIYYA NERI MRN: [id number]  Date: 04/19/2023  DOB: 1961-03-13  STEREOTACTIC BODY RADIOTHERAPY SIMULATION AND TREATMENT PLANNING NOTE    ICD-10-CM   1. 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  NARRATIVE:  The patient was brought to the CT Simulation planning suite.  Identity was confirmed.  All relevant records and images related to the planned course of therapy were reviewed.  The patient freely provided informed written consent to proceed with treatment after reviewing the details related to the planned course of therapy. The consent form was witnessed and verified by the simulation staff.  Then, the patient was set-up in a stable reproducible  supine position for radiation therapy.  A BodyFix immobilization pillow was fabricated for reproducible positioning.  Then I personally applied the abdominal compression paddle to limit respiratory excursion.  4D respiratoy motion management CT images were obtained.  Surface markings were placed.  The CT images were loaded into the planning software.  Then, using Cine, MIP, and standard views, the internal target volume (ITV) and planning target volumes (PTV) were delinieated, and avoidance structures were contoured.  Treatment planning then occurred.  The radiation prescription was entered and confirmed.  A total of two complex treatment devices were fabricated in the form of the BodyFix immobilization pillow and a neck accuform cushion.  I have requested : 3D Simulation  I have requested a DVH of the following structures: Heart, Lungs, Esophagus, Chest Wall, Brachial Plexus, Major Blood Vessels, and targets.  SPECIAL TREATMENT PROCEDURE:  The planned course of therapy using radiation constitutes a special  treatment procedure. Special care is required in the management of this patient for the following reasons. This treatment constitutes a Special Treatment Procedure for the following reason: [ High dose per fraction requiring special monitoring for increased toxicities of treatment including daily imaging..  The special nature of the planned course of radiotherapy will require increased physician supervision and oversight to ensure patient's safety with optimal treatment outcomes.  This requires extended time and effort.    RESPIRATORY MOTION MANAGEMENT SIMULATION:  In order to account for effect of respiratory motion on target structures and other organs in the planning and delivery of radiotherapy, this patient underwent respiratory motion management simulation.  To accomplish this, when the patient was brought to the CT simulation planning suite, 4D respiratoy motion management CT images were obtained.  The CT images were loaded into the planning software.  Then, using a variety of tools including Cine, MIP, and standard views, the target volume and planning target volumes (PTV) were delineated.  Avoidance structures were contoured.  Treatment planning then occurred.  Dose volume histograms were generated and reviewed for each of the requested structure.  The resulting plan was carefully reviewed and approved today.  PLAN:  The patient will receive 50 Gy in 5 fractions of 10 Gy each.  ________________________________  Artist Pais. Kathrynn Running, M.D.

## 2023-04-19 ENCOUNTER — Ambulatory Visit (HOSPITAL_COMMUNITY)
Admission: RE | Admit: 2023-04-19 | Discharge: 2023-04-19 | Disposition: A | Discharge status: Home / Self Care | Payer: BC Managed Care – PPO | Source: Ambulatory Visit | Attending: Internal Medicine | Admitting: Internal Medicine

## 2023-04-19 ENCOUNTER — Ambulatory Visit
Admission: RE | Admit: 2023-04-19 | Discharge: 2023-04-19 | Disposition: A | Discharge status: Home / Self Care | Payer: BC Managed Care – PPO | Source: Ambulatory Visit | Attending: Radiation Oncology | Admitting: Radiation Oncology

## 2023-04-19 DIAGNOSIS — Z905 Acquired absence of kidney: Secondary | ICD-10-CM | POA: Insufficient documentation

## 2023-04-19 DIAGNOSIS — C641 Malignant neoplasm of right kidney, except renal pelvis: Secondary | ICD-10-CM | POA: Insufficient documentation

## 2023-04-19 DIAGNOSIS — M62838 Other muscle spasm: Secondary | ICD-10-CM | POA: Insufficient documentation

## 2023-04-19 DIAGNOSIS — G893 Neoplasm related pain (acute) (chronic): Secondary | ICD-10-CM | POA: Insufficient documentation

## 2023-04-19 DIAGNOSIS — Z510 Encounter for antineoplastic radiation therapy: Secondary | ICD-10-CM | POA: Insufficient documentation

## 2023-04-19 DIAGNOSIS — K208 Other esophagitis without bleeding: Secondary | ICD-10-CM | POA: Insufficient documentation

## 2023-04-19 DIAGNOSIS — C7951 Secondary malignant neoplasm of bone: Secondary | ICD-10-CM | POA: Diagnosis present

## 2023-04-19 DIAGNOSIS — C649 Malignant neoplasm of unspecified kidney, except renal pelvis: Secondary | ICD-10-CM | POA: Insufficient documentation

## 2023-04-19 DIAGNOSIS — R059 Cough, unspecified: Secondary | ICD-10-CM | POA: Insufficient documentation

## 2023-04-19 DIAGNOSIS — Z79899 Other long term (current) drug therapy: Secondary | ICD-10-CM | POA: Insufficient documentation

## 2023-04-19 MED ORDER — FLUDEOXYGLUCOSE F - 18 (FDG) INJECTION
9.4600 | Freq: Once | INTRAVENOUS | Status: AC | PRN
  Administered 2023-04-19: 9.46 via INTRAVENOUS

## 2023-04-23 ENCOUNTER — Ambulatory Visit: Payer: 59

## 2023-04-23 ENCOUNTER — Ambulatory Visit
Admission: RE | Admit: 2023-04-23 | Discharge: 2023-04-23 | Discharge status: Home / Self Care | Payer: 59 | Source: Ambulatory Visit | Attending: Radiation Oncology | Admitting: Radiation Oncology

## 2023-04-23 ENCOUNTER — Ambulatory Visit
Admission: RE | Admit: 2023-04-23 | Discharge: 2023-04-23 | Disposition: A | Discharge status: Home / Self Care | Payer: 59 | Source: Ambulatory Visit | Attending: Radiation Oncology | Admitting: Radiation Oncology

## 2023-04-23 ENCOUNTER — Ambulatory Visit
Admission: RE | Admit: 2023-04-23 | Discharge: 2023-04-23 | Disposition: A | Discharge status: Home / Self Care | Payer: 59 | Source: Ambulatory Visit | Attending: Radiology | Admitting: Radiology

## 2023-04-23 DIAGNOSIS — Z510 Encounter for antineoplastic radiation therapy: Secondary | ICD-10-CM | POA: Diagnosis not present

## 2023-04-23 DIAGNOSIS — Z79899 Other long term (current) drug therapy: Secondary | ICD-10-CM | POA: Diagnosis not present

## 2023-04-23 DIAGNOSIS — G893 Neoplasm related pain (acute) (chronic): Secondary | ICD-10-CM | POA: Diagnosis not present

## 2023-04-23 DIAGNOSIS — Z905 Acquired absence of kidney: Secondary | ICD-10-CM | POA: Diagnosis not present

## 2023-04-23 DIAGNOSIS — C641 Malignant neoplasm of right kidney, except renal pelvis: Secondary | ICD-10-CM | POA: Diagnosis not present

## 2023-04-23 DIAGNOSIS — C7951 Secondary malignant neoplasm of bone: Secondary | ICD-10-CM

## 2023-04-23 DIAGNOSIS — C649 Malignant neoplasm of unspecified kidney, except renal pelvis: Secondary | ICD-10-CM

## 2023-04-23 DIAGNOSIS — M62838 Other muscle spasm: Secondary | ICD-10-CM | POA: Diagnosis not present

## 2023-04-23 DIAGNOSIS — K208 Other esophagitis without bleeding: Secondary | ICD-10-CM | POA: Diagnosis not present

## 2023-04-23 DIAGNOSIS — R059 Cough, unspecified: Secondary | ICD-10-CM | POA: Diagnosis not present

## 2023-04-23 NOTE — Progress Notes (Signed)
Radiation Oncology         [phone] ________________________________  Initial inpatient Consultation -via telephone   I connected with Dennison Mascot on 04/23/2023 by a video enabled telemedicine application and verified that I am speaking with the correct person using two identifiers.   I discussed the limitations of evaluation and management by telemedicine. The patient expressed understanding and agreed to proceed.  Name: Dana Hanson MRN: [id number]  Date: 04/23/2023  DOB: [birth year]  VH:QIONG, Channing Mutters, MD  Si Gaul, MD   REFERRING PHYSICIAN: Si Gaul, MD  DIAGNOSIS: 62 y.o. 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 Initially diagnosed with localized disease in 2017.     ICD-10-CM   1. Metastatic renal cell carcinoma to bone [facility])  C79.51    C64.9       HISTORY OF PRESENT ILLNESS: Dana Hanson is a 62 y.o. female with a diagnosis of stage IV clear-cell renal cell carcinoma. She was originally diagnosed in 2017 and underwent a right radical nephrectomy in February of 2017. In February of 2023 a T6 spinal lesion was biopsied under the care of Dr. Franky Macho. Final pathology revealed metastatic renal cell carcinoma. At that time, she underwent spinal stereotactic radiosurgery on March 14th, 2023 to the T6 spine. She then completed 1 year of immunotherapy, finishing in March of 2024. Follow-up PET on 03/30/2022 revealed no evidence of disease. However, due to a collapsed T6 vertebra, she underwent a laparoscopic procedure to place pedical screws in T4-T8 for stabilization on 10/11/2022 under the care of Dr. Franky Macho. Patient continued under observation. She underwent a restaging PET scan on 12/13/2022 that showed interval posterior thoracic fusion spanning the T6 pathologic fracture along with moderate metabolic activity within the T6 vertebral body.  No evidence of disease was seen elsewhere.   Most recently, she presented to the ER on 04/08/2023 with severe back pain. CT of the thoracic spine demonstrated a pathologic fracture of the T6 vertebral body and increased soft tissue density posterior to the posterior wall of the T6 vertebral body. An MRI confirmed the presence of progressive marrow replacement of the T6 vertebral body with significant bulging of the posterior vertebral body and a small amount of enhancing epidural tumor within the anterior epidural space at the T5 and T6 level. Enhancing extraosseous soft tissue extends into the left-sided paravertebral soft tissues that extends inferiorly to the T7 level. No significant canal stenosis or cord compression was visualized.   The patient reviewed the results with Dr. Arbutus Ped who recommended tissue confirmation and PET to assess for other potential metastatic sites. She was kindly referred to [place] today to discuss radiation therapy options. Patient underwent PET on 04/19/23. Results of this PET scan are still pending.    PREVIOUS RADIATION THERAPY: Yes   10/04/21: The T6 vertebral body was treated with 18 Gy to the gross disease while a secondary lower prescription dose of 15 Gy was delivered to the entirety of each directly involved marrow compartment of the gross disease   PAST MEDICAL HISTORY:  Past Medical History:  Diagnosis Date   Arthritis    right knee   Cancer (HCC)    kidney   Gallstone pancreatitis    Hyperlipidemia    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   IBS (irritable bowel syndrome)    patient denies   Right renal mass  PAST SURGICAL HISTORY: Past Surgical History:  Procedure Laterality Date   CESAREAN SECTION     x 2-tubal with last c-section   CHOLECYSTECTOMY N/A 09/03/2015   Procedure: LAPAROSCOPIC CHOLECYSTECTOMY WITH INTRAOPERATIVE CHOLANGIOGRAM;  Surgeon: Rodman Pickle, MD;  Location: [facility];  Service: General;  Laterality: N/A;   COLONOSCOPY   08/14/2011   Surgeon: Corbin Ade, MD; external hemorrhoid tags, likely source of hematochezia, normal rectum, multiple colonic polyps at the base of the cecum, all less than 5 mm, single 4 mm sigmoid polyp, scattered diverticula.  Pathology revealed tubular adenoma in the cecum, hyperplastic polyp in the sigmoid colon.  Recommended repeat colonoscopy in 3 years.   COLONOSCOPY WITH PROPOFOL N/A 11/30/2021   Procedure: COLONOSCOPY WITH PROPOFOL;  Surgeon: Corbin Ade, MD;  Location: [facility];  Service: Endoscopy;  Laterality: N/A;  1:45pm   HEMORRHOID SURGERY  11/10/2011   Procedure: HEMORRHOIDECTOMY;  Surgeon: Fabio Bering, MD;  Location: [facility];  Service: General;  Laterality: N/A;   IR RADIOLOGIST EVAL & MGMT  04/17/2023   LAPAROSCOPIC GASTRIC SLEEVE RESECTION  01/15/2015   LAPAROSCOPIC NEPHRECTOMY N/A 09/03/2015   Procedure: LAPAROSCOPIC RADICAL NEPHRECTOMY;  Surgeon: Crist Fat, MD;  Location: [facility];  Service: Urology;  Laterality: N/A;   LUMBAR PERCUTANEOUS PEDICLE SCREW 4 LEVEL N/A 10/11/2022   Procedure: OPEN REDUCTION INTERNAL FIXATION OF THORACIC SIX PATHOLOGIC FRACTURE;  Surgeon: Coletta Memos, MD;  Location: [facility];  Service: Neurosurgery;  Laterality: N/A;   POLYPECTOMY  11/30/2021   Procedure: POLYPECTOMY;  Surgeon: Corbin Ade, MD;  Location: [facility];  Service: Endoscopy;;   TUBAL LIGATION     with last c-section   VERTEBROPLASTY N/A 09/09/2021   Procedure: Thoracic vertebral biopsy;  Surgeon: Coletta Memos, MD;  Location: [facility];  Service: Neurosurgery;  Laterality: N/A;  RM 21    FAMILY HISTORY:  Family History  Adopted: Yes  Problem Relation Age of Onset   Colon cancer Neg Hx    Liver disease Neg Hx    Anesthesia problems Neg Hx    Hypotension Neg Hx    Malignant hyperthermia Neg Hx    Pseudochol deficiency Neg Hx     SOCIAL HISTORY:  Social History   Socioeconomic History   Marital status: Divorced    Spouse name: Not on file    Number of children: 2   Years of education: Not on file   Highest education level: Bachelor's degree (e.g., BA, AB, BS)  Occupational History    Employer: [id number]  Tobacco Use   Smoking status: Never   Smokeless tobacco: Never  Vaping Use   Vaping status: Never Used  Substance and Sexual Activity   Alcohol use: No   Drug use: No   Sexual activity: Yes    Birth control/protection: Surgical  Other Topics Concern   Not on file  Social History Narrative   Lives alone   Social Determinants of Health   Financial Resource Strain: Not on file  Food Insecurity: Not on file  Transportation Needs: Not on file  Physical Activity: Not on file  Stress: Not on file  Social Connections: Not on file  Intimate Partner Violence: Not on file    ALLERGIES: Patient has no known allergies.  MEDICATIONS:  Current Outpatient Medications  Medication Sig Dispense Refill   acetaminophen (TYLENOL) 500 MG tablet Take 500-1,000 mg by mouth every 6 (six) hours as needed (pain.).     levothyroxine (SYNTHROID) 50 MCG tablet Take  50 mcg by mouth daily before breakfast.     Multiple Vitamin (MULITIVITAMIN WITH MINERALS) TABS Take 1 tablet by mouth in the morning.     oxyCODONE-acetaminophen (PERCOCET/ROXICET) 5-325 MG tablet Take 1 tablet by mouth every 6 (six) hours as needed for severe pain. 10 tablet 0   pregabalin (LYRICA) 100 MG capsule Take 100 mg by mouth 3 (three) times daily.     pregabalin (LYRICA) 75 MG capsule Take 75 mg by mouth 3 (three) times daily.     tiZANidine (ZANAFLEX) 4 MG tablet Take 1 tablet (4 mg total) by mouth every 6 (six) hours as needed for muscle spasms. 60 tablet 0   triamcinolone cream (KENALOG) 0.1 % Apply 1 Application topically daily as needed (skin rash/irritation.).     No current facility-administered medications for this encounter.    REVIEW OF SYSTEMS:  On review of systems, the patient reports that she is experiencing mid-back pain that worsens with positional  changes. She rates this pain 5/10 and has been taking Tylenol and muscle relaxer for it. She denies any bowel issues or urinary urgency, extremity numbness or weakness.     PHYSICAL EXAM:  Wt Readings from Last 3 Encounters:  04/11/23 188 lb 11.2 oz (85.6 kg)  04/08/23 176 lb 5.9 oz (80 kg)  02/14/23 178 lb (80.7 kg)   Temp Readings from Last 3 Encounters:  04/17/23 98.4 F (36.9 C) (Oral)  04/11/23 97.8 F (36.6 C) (Oral)  04/08/23 98.4 F (36.9 C) (Oral)   BP Readings from Last 3 Encounters:  04/17/23 129/89  04/11/23 132/79  04/08/23 122/78   Pulse Readings from Last 3 Encounters:  04/17/23 80  04/11/23 82  04/08/23 78    /10  Deferred due to nature of telephone visit.     KPS = 90  100 - Normal; no complaints; no evidence of disease. 90   - Able to carry on normal activity; minor signs or symptoms of disease. 80   - Normal activity with effort; some signs or symptoms of disease. 66   - Cares for self; unable to carry on normal activity or to do active work. 60   - Requires occasional assistance, but is able to care for most of his personal needs. 50   - Requires considerable assistance and frequent medical care. 40   - Disabled; requires special care and assistance. 30   - Severely disabled; hospital admission is indicated although death not imminent. 20   - Very sick; hospital admission necessary; active supportive treatment necessary. 10   - Moribund; fatal processes progressing rapidly. 0     - Dead  Karnofsky DA, Abelmann WH, Craver LS and Burchenal [facility] [phone]) The use of the nitrogen mustards in the palliative treatment of carcinoma: with particular reference to bronchogenic carcinoma Cancer 1 634-56  LABORATORY DATA:  Lab Results  Component Value Date   WBC 4.9 04/08/2023   HGB 11.9 (L) 04/08/2023   HCT 37.5 04/08/2023   MCV 87.8 04/08/2023   PLT 242 04/08/2023   Lab Results  Component Value Date   NA 139 04/08/2023   K 4.4 04/08/2023   CL 106  04/08/2023   CO2 25 04/08/2023   Lab Results  Component Value Date   ALT 10 04/08/2023   AST 15 04/08/2023   ALKPHOS 50 04/08/2023   BILITOT 0.7 04/08/2023     RADIOGRAPHY: IR Radiologist Eval & Mgmt  Result Date: 04/17/2023 EXAM: NEW PATIENT OFFICE VISIT CHIEF COMPLAINT: Refer to EMR  HISTORY OF PRESENT ILLNESS: Patient was seen in consultation for consideration of T6 biopsy and possible OsteoCool for treatment of symptomatic back pain related to metastatic renal cell carcinoma. Patient was initially diagnosed with renal cancer in 2017 and is status post nephrectomy at that time which point it was thought to be local disease only. She developed back pain in early 2023, and surgical biopsy at that time demonstrated metastatic RCC at T6. This was followed by single session stereotactic radiosurgery in March 2023, although progressive height loss and compression fracture at T6 necessitated T4-T8 posterior fusion in March 2024. Since that time, she has continued progressive and at times severe mid back pain. The pain is particularly bothersome with most movements and repositioning. She currently takes Tylenol predominantly for pain, which does reduce the pain from 7/10 to 3-4/10. She occasionally uses a muscle relaxer, but tends to avoid any narcotic pain medicine due to her work, and trying to maintain an active lifestyle. She does report significant disability on the [employer] disability questionnaire with 18/24 positive. Per the patient and referring oncologist, the patient is planning to start external beam radiation therapy with Dr. Kathrynn Running, with 5 sessions planned between October 8 - October 18. While OsteoCool is indicated in the setting of osseous metastatic disease with symptomatic back pain, there are several factors that make treatment with percutaneous therapy challenging in this case. The underlying tumor is RCC, which sent to be vascular lesions and therefore slightly increased the risk of  having an epidural hematoma which can be a surgical emergency in a high to midthoracic vertebral bodies such as T6. Additionally, presence of posterior fusion hardware may make cannula placement challenging or impossible. These were discussed in detail with patient. Given that she is proceeding with external beam radiation, which may potentially alleviate her back pain alone, we agreed to reassess her symptoms following external beam radiation in early November. Given that the patient has had previous surgical biopsy at T6, Dr. Arbutus Ped was agreeable to foregoing the biopsy at this time. REVIEW OF SYSTEMS: Refer to EMR PHYSICAL EXAMINATION: Refer to EMR ASSESSMENT AND PLAN: Refer to EMR Electronically Signed   By: Olive Bass M.D.   On: 04/17/2023 16:07   DG Thoracic Spine 2 View  Result Date: 04/15/2023 CLINICAL DATA:  Acute back pain EXAM: THORACIC SPINE 2 VIEWS COMPARISON:  11/02/2022 FINDINGS: Postsurgical changes are again seen. Compression fracture at T6 is noted and stable. Orthopedic hardware appears intact. No new compression deformity is noted. No soft tissue abnormality is seen. IMPRESSION: T6 compression fracture with postoperative change. No acute abnormality noted. Electronically Signed   By: Alcide Clever M.D.   On: 04/15/2023 01:11   MR THORACIC SPINE W WO CONTRAST  Result Date: 04/08/2023 CLINICAL DATA:  Back pain. History of metastatic renal cell carcinoma EXAM: MRI THORACIC WITHOUT AND WITH CONTRAST TECHNIQUE: Multiplanar and multiecho pulse sequences of the thoracic spine were obtained without and with intravenous contrast. CONTRAST:  8mL GADAVIST GADOBUTROL 1 MMOL/ML IV SOLN COMPARISON:  CT 04/08/2023, MRI 05/31/2022 FINDINGS: Alignment:  Physiologic. Vertebrae: Postsurgical changes from posterior spinal fusion spanning from T4-T8. Progressive marrow replacement of the T6 vertebral body with diffuse low T1 marrow signal throughout the vertebral body with avid enhancement on postcontrast  imaging. There is significant bulging of the posterior vertebral body. Small amount of enhancing epidural tumor within the anterior epidural space at the T5 and T6 level (series 27, image 10). Enhancing extraosseous soft tissue extends into the left-sided paravertebral  soft tissues that extends inferiorly to the T7 level (series 27, images 15-17). Chronic compression fracture of the T6 vertebral body without significant progression of height loss. There are no additional marrow replacing bone lesions. No additional fractures. No evidence of discitis. Cord:  Normal signal and morphology. Paraspinal and other soft tissues: Left paravertebral extraosseous tumor at the T6 and T7 levels, as above. Remaining paraspinal soft tissues are within normal limits. Disc levels: Posterior bowing of the T6 vertebral body and epidural tumor results in impress upon the ventral thecal sac, although no significant canal stenosis at this level. No significant canal or foraminal stenosis at any level within the thoracic spine. IMPRESSION: 1. Progressive marrow replacement of the T6 vertebral body with significant bulging of the posterior vertebral body and small amount of enhancing epidural tumor within the anterior epidural space at the T5 and T6 level. Enhancing extraosseous soft tissue extends into the left-sided paravertebral soft tissues that extends inferiorly to the T7 level. Findings are compatible with known metastatic disease. 2. No significant canal stenosis or cord compression at this level. 3. Chronic compression fracture of the T6 vertebral body without significant progression of height loss. 4. No new metastatic lesions within the thoracic spine. Electronically Signed   By: Duanne Guess D.O.   On: 04/08/2023 18:38   CT THORACIC SPINE W CONTRAST  Result Date: 04/08/2023 CLINICAL DATA:  Metastatic disease evaluation Mid-back pain, prior compression fracture History of renal cell carcinoma history of bony metastasis  to the T6 vertebrae in the past. Patient with pain at the same location. EXAM: CT THORACIC SPINE WITH CONTRAST TECHNIQUE: Multidetector CT images of thoracic was performed according to the standard protocol following intravenous contrast administration. RADIATION DOSE REDUCTION: This exam was performed according to the departmental dose-optimization program which includes automated exposure control, adjustment of the mA and/or kV according to patient size and/or use of iterative reconstruction technique. CONTRAST:  OMNIPAQUE IOHEXOL 300 MG/ML  SOLN COMPARISON:  CT 08/18/2022 FINDINGS: Alignment: Mildly exaggerated thoracic kyphosis. No traumatic listhesis. Vertebrae: Chronic pathologic fracture of the T6 vertebral body with approximately 50% vertebral body height loss, similar to the previous exam. Interval posterior instrumented fusion of the midthoracic spine spanning from T4-T8. Hardware intact without evidence of loosening. No new fractures. No new lytic or sclerotic bone lesions are identified. Paraspinal and other soft tissues: Aortic and coronary artery atherosclerosis. Small hiatal hernia. Disc levels: Increased soft tissue density posterior to the posterior wall of the T6 vertebral body narrowing the thoracic canal at this level (series 7, image 84; series 11, image 50). This finding was not evident on the previous exam. There is also increased soft tissue density in the paraspinal soft tissues adjacent to the T6 vertebral body. IMPRESSION: 1. Chronic pathologic fracture of the T6 vertebral body with approximately 50% vertebral body height loss, similar to the previous exam. 2. Increased soft tissue density posterior to the posterior wall of the T6 vertebral body narrowing the thoracic canal at this level. There is also increased soft tissue density in the paraspinal soft tissues adjacent to the T6 vertebral body. Findings are concerning for tumor recurrence. Further evaluation with contrast-enhanced  MRI of the thoracic spine is recommended to assess for potential canal stenosis at this level. 3. Interval posterior instrumented fusion of the midthoracic spine spanning from T4-T8. Hardware intact without evidence of loosening. 4. Aortic and coronary artery atherosclerosis (ICD10-I70.0). Electronically Signed   By: Duanne Guess D.O.   On: 04/08/2023 15:04  IMPRESSION/PLAN: 1. 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 Initially diagnosed with localized disease in 2017.  Patient is experiencing a significant amount of pain from visualized spinal disease. MRI also demonstrated epidural disease. Patient's case was reviewed at our tumor board this morning. The consensus was that this is most likely disease recurrence, and due to the urgency of the situation, we should proceed with radiation treatment without tissue confirmation or final PET results.   Today, I talked to the patient about the findings and work-up thus far. We discussed the natural history of metastatic renal cell carcinoma and general treatment, highlighting the role of radiotherapy in the management.  We discussed the available radiation techniques, and focused on the details of logistics and delivery.  We reviewed the anticipated acute and late sequelae associated with radiation in this setting.  The patient was encouraged to ask questions that I answered to the best of my ability. The patient would like to proceed with radiation. She completed her CT simulation on 04/19/2023 where a consent form was reviewed and signed. We will share our discussion with Dr. Arbutus Ped today and look forward to participating in this patient's care. Anticipate 50 Gy in 5 fractions to the T-spine.   We personally spent 40 minutes in this encounter including chart review, reviewing radiological studies, meeting face-to-face with the  patient, entering orders and completing documentation.     Joyice Faster, PA-C    Margaretmary Dys, MD  [facility]  Radiation Oncology Direct Dial: [phone]  Fax: [phone] [email or web address]  Skype  [email or web address]

## 2023-04-24 ENCOUNTER — Other Ambulatory Visit: Payer: Self-pay

## 2023-04-24 ENCOUNTER — Telehealth: Payer: Self-pay | Admitting: Medical Oncology

## 2023-04-24 ENCOUNTER — Ambulatory Visit
Admission: RE | Admit: 2023-04-24 | Discharge: 2023-04-24 | Disposition: A | Discharge status: Home / Self Care | Payer: BC Managed Care – PPO | Source: Ambulatory Visit | Attending: Radiation Oncology | Admitting: Radiation Oncology

## 2023-04-24 DIAGNOSIS — C7951 Secondary malignant neoplasm of bone: Secondary | ICD-10-CM | POA: Diagnosis not present

## 2023-04-24 LAB — RAD ONC ARIA SESSION SUMMARY
Course Elapsed Days: 0
Plan Fractions Treated to Date: 1
Plan Prescribed Dose Per Fraction: 10 Gy
Plan Total Fractions Prescribed: 5
Plan Total Prescribed Dose: 50 Gy
Reference Point Dosage Given to Date: 10 Gy
Reference Point Session Dosage Given: 10 Gy
Session Number: 1

## 2023-04-24 NOTE — Telephone Encounter (Signed)
TSH Labs scheduled for next wed. Pt is taking synthroid 50 mcg

## 2023-04-25 ENCOUNTER — Ambulatory Visit: Payer: BC Managed Care – PPO

## 2023-04-26 ENCOUNTER — Other Ambulatory Visit: Payer: Self-pay

## 2023-04-26 ENCOUNTER — Ambulatory Visit
Admission: RE | Admit: 2023-04-26 | Discharge: 2023-04-26 | Disposition: A | Discharge status: Home / Self Care | Payer: BC Managed Care – PPO | Source: Ambulatory Visit | Attending: Radiation Oncology | Admitting: Radiation Oncology

## 2023-04-26 DIAGNOSIS — C7951 Secondary malignant neoplasm of bone: Secondary | ICD-10-CM | POA: Diagnosis not present

## 2023-04-26 LAB — RAD ONC ARIA SESSION SUMMARY
Course Elapsed Days: 2
Plan Fractions Treated to Date: 2
Plan Prescribed Dose Per Fraction: 10 Gy
Plan Total Fractions Prescribed: 5
Plan Total Prescribed Dose: 50 Gy
Reference Point Dosage Given to Date: 20 Gy
Reference Point Session Dosage Given: 10 Gy
Session Number: 2

## 2023-04-27 ENCOUNTER — Ambulatory Visit: Payer: BC Managed Care – PPO

## 2023-04-30 ENCOUNTER — Ambulatory Visit
Admission: RE | Admit: 2023-04-30 | Discharge: 2023-04-30 | Disposition: A | Discharge status: Home / Self Care | Payer: 59 | Source: Ambulatory Visit | Attending: Radiation Oncology | Admitting: Radiation Oncology

## 2023-04-30 ENCOUNTER — Other Ambulatory Visit: Payer: Self-pay

## 2023-04-30 DIAGNOSIS — C7951 Secondary malignant neoplasm of bone: Secondary | ICD-10-CM | POA: Diagnosis not present

## 2023-04-30 LAB — RAD ONC ARIA SESSION SUMMARY
Course Elapsed Days: 6
Plan Fractions Treated to Date: 3
Plan Prescribed Dose Per Fraction: 10 Gy
Plan Total Fractions Prescribed: 5
Plan Total Prescribed Dose: 50 Gy
Reference Point Dosage Given to Date: 30 Gy
Reference Point Session Dosage Given: 10 Gy
Session Number: 3

## 2023-05-02 ENCOUNTER — Ambulatory Visit
Admission: RE | Admit: 2023-05-02 | Discharge: 2023-05-02 | Disposition: A | Discharge status: Home / Self Care | Payer: BC Managed Care – PPO | Source: Ambulatory Visit | Attending: Radiation Oncology | Admitting: Radiation Oncology

## 2023-05-02 ENCOUNTER — Inpatient Hospital Stay: Payer: BC Managed Care – PPO

## 2023-05-02 ENCOUNTER — Other Ambulatory Visit: Payer: Self-pay

## 2023-05-02 ENCOUNTER — Other Ambulatory Visit: Payer: Self-pay | Admitting: Internal Medicine

## 2023-05-02 ENCOUNTER — Inpatient Hospital Stay (HOSPITAL_BASED_OUTPATIENT_CLINIC_OR_DEPARTMENT_OTHER): Payer: BC Managed Care – PPO | Admitting: Internal Medicine

## 2023-05-02 VITALS — BP 128/79 | HR 84 | Temp 97.5°F | Resp 17 | Ht 67.0 in | Wt 189.2 lb

## 2023-05-02 DIAGNOSIS — M545 Low back pain, unspecified: Secondary | ICD-10-CM | POA: Insufficient documentation

## 2023-05-02 DIAGNOSIS — C7951 Secondary malignant neoplasm of bone: Secondary | ICD-10-CM | POA: Insufficient documentation

## 2023-05-02 DIAGNOSIS — E039 Hypothyroidism, unspecified: Secondary | ICD-10-CM

## 2023-05-02 DIAGNOSIS — Z905 Acquired absence of kidney: Secondary | ICD-10-CM | POA: Insufficient documentation

## 2023-05-02 DIAGNOSIS — C641 Malignant neoplasm of right kidney, except renal pelvis: Secondary | ICD-10-CM | POA: Insufficient documentation

## 2023-05-02 DIAGNOSIS — Z923 Personal history of irradiation: Secondary | ICD-10-CM | POA: Insufficient documentation

## 2023-05-02 DIAGNOSIS — C649 Malignant neoplasm of unspecified kidney, except renal pelvis: Secondary | ICD-10-CM | POA: Diagnosis not present

## 2023-05-02 DIAGNOSIS — Z79899 Other long term (current) drug therapy: Secondary | ICD-10-CM | POA: Insufficient documentation

## 2023-05-02 LAB — RAD ONC ARIA SESSION SUMMARY
Course Elapsed Days: 8
Plan Fractions Treated to Date: 4
Plan Prescribed Dose Per Fraction: 10 Gy
Plan Total Fractions Prescribed: 5
Plan Total Prescribed Dose: 50 Gy
Reference Point Dosage Given to Date: 40 Gy
Reference Point Session Dosage Given: 10 Gy
Session Number: 4

## 2023-05-02 LAB — CMP (CANCER CENTER ONLY)
ALT: 10 U/L (ref 0–44)
AST: 14 U/L — ABNORMAL LOW (ref 15–41)
Albumin: 4 g/dL (ref 3.5–5.0)
Alkaline Phosphatase: 55 U/L (ref 38–126)
Anion gap: 8 (ref 5–15)
BUN: 23 mg/dL — ABNORMAL HIGH (ref 6–20)
CO2: 27 mmol/L (ref 22–32)
Calcium: 9.5 mg/dL (ref 8.9–10.3)
Chloride: 106 mmol/L (ref 98–111)
Creatinine: 1.16 mg/dL — ABNORMAL HIGH (ref 0.44–1.00)
GFR, Estimated: 54 mL/min — ABNORMAL LOW (ref >60)
Glucose, Bld: 99 mg/dL (ref 70–99)
Potassium: 4.2 mmol/L (ref 3.5–5.1)
Sodium: 141 mmol/L (ref 135–145)
Total Bilirubin: 0.7 mg/dL (ref 0.3–1.2)
Total Protein: 7 g/dL (ref 6.5–8.1)

## 2023-05-02 LAB — CBC WITH DIFFERENTIAL (CANCER CENTER ONLY)
Abs Immature Granulocytes: 0.01 10*3/uL (ref 0.00–0.07)
Basophils Absolute: 0 10*3/uL (ref 0.0–0.1)
Basophils Relative: 1 %
Eosinophils Absolute: 0.2 10*3/uL (ref 0.0–0.5)
Eosinophils Relative: 4 %
HCT: 37.3 % (ref 36.0–46.0)
Hemoglobin: 11.9 g/dL — ABNORMAL LOW (ref 12.0–15.0)
Immature Granulocytes: 0 %
Lymphocytes Relative: 19 %
Lymphs Abs: 0.8 10*3/uL (ref 0.7–4.0)
MCH: 28.1 pg (ref 26.0–34.0)
MCHC: 31.9 g/dL (ref 30.0–36.0)
MCV: 88 fL (ref 80.0–100.0)
Monocytes Absolute: 0.3 10*3/uL (ref 0.1–1.0)
Monocytes Relative: 8 %
Neutro Abs: 2.8 10*3/uL (ref 1.7–7.7)
Neutrophils Relative %: 68 %
Platelet Count: 239 10*3/uL (ref 150–400)
RBC: 4.24 MIL/uL (ref 3.87–5.11)
RDW: 14.3 % (ref 11.5–15.5)
WBC Count: 4.1 10*3/uL (ref 4.0–10.5)
nRBC: 0 % (ref 0.0–0.2)

## 2023-05-02 LAB — LACTATE DEHYDROGENASE: LDH: 122 U/L (ref 98–192)

## 2023-05-02 LAB — TSH: TSH: 8.526 u[IU]/mL — ABNORMAL HIGH (ref 0.350–4.500)

## 2023-05-02 MED ORDER — LEVOTHYROXINE SODIUM 50 MCG PO TABS: 50.0000 ug | ORAL_TABLET | Freq: Every day | ORAL | 5 refills | Status: AC

## 2023-05-02 NOTE — Progress Notes (Signed)
[facility] [phone]   [phone]  OFFICE PROGRESS NOTE  [name], MD [address] 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   Initially diagnosed with localized disease in 2017.    PRIOR THERAPY: 1) She is status post laparoscopic right radical nephrectomy performed in February 2017.  She was found to have 6.5 cm clear-cell renal cell carcinoma.   2) She is status post surgical biopsy completed by Dr. Franky Macho on September 09, 2021 of the T6 spinal lesion which confirmed the presence of metastatic renal cell carcinoma.   3) She is status post spinal stereotactic radiosurgery completed on September 27, 2021 for isolated metastatic lesion of the T6 spine.  4) Pembrolizumab 200 mg every 3 weeks cycle 1 started on Nov 18, 2021.  She received 400 mg starting with cycle 4 on January 20, 2022. She completed total of 1 year with the last treatment in March 2024. 5) palliative radiotherapy to the T6 vertebral body lesion under the care of Dr. Kathrynn Running.   CURRENT THERAPY: Observation  INTERVAL HISTORY: Dana Hanson 62 y.o. female returns to the clinic today for follow-up visit. Discussed the use of AI scribe software for clinical note transcription with the patient, who gave verbal consent to proceed.  History of Present Illness   The patient, a 62 year old with a history of stage 4 renal cell carcinoma, underwent a right nephrectomy and gallbladder removal in February 2023. They also had surgical intervention for metastasis to the T6 vertebrae in March of the same year. The patient completed a year of treatment with Keytruda in March 2024. They have been receiving radiation therapy for the T6 lesion, with three sessions completed and two more planned.  The patient reports severe back pain, which they  describe as radiating around to their ribs. The pain varies in intensity, with some days being better than others. The patient manages the pain with a muscle relaxer and oxycodone, primarily at night due to the need to function during the day.  The patient also has a history of thyroid issues, with medication adjustments pending recent lab results.       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  MEDICATIONS:  Current Outpatient Medications  Medication Sig Dispense Refill   acetaminophen (TYLENOL) 500 MG tablet Take 500-1,000 mg by mouth every 6 (six) hours as needed (pain.).     levothyroxine (SYNTHROID) 50 MCG tablet Take 50 mcg by mouth daily before breakfast.     Multiple Vitamin (MULITIVITAMIN WITH MINERALS) TABS Take 1 tablet by mouth in the morning.     oxyCODONE-acetaminophen (PERCOCET/ROXICET) 5-325 MG tablet Take 1 tablet by mouth every 6 (six) hours as needed for severe pain. 10 tablet 0   pregabalin (LYRICA) 100 MG capsule Take 100 mg by mouth 3 (three) times daily.     pregabalin (LYRICA) 75 MG capsule Take 75 mg by mouth 3 (three) times daily.     tiZANidine (ZANAFLEX) 4 MG tablet  Take 1 tablet (4 mg total) by mouth every 6 (six) hours as needed for muscle spasms. 60 tablet 0   triamcinolone cream (KENALOG) 0.1 % Apply 1 Application topically daily as needed (skin rash/irritation.).     No current facility-administered medications for this visit.    SURGICAL HISTORY:  Past Surgical History:  Procedure Laterality Date   CESAREAN SECTION     x 2-tubal with last c-section   CHOLECYSTECTOMY N/A 09/03/2015   Procedure: LAPAROSCOPIC CHOLECYSTECTOMY WITH INTRAOPERATIVE CHOLANGIOGRAM;  Surgeon: Rodman Pickle,  MD;  Location: [facility];  Service: General;  Laterality: N/A;   COLONOSCOPY  08/14/2011   Surgeon: Corbin Ade, MD; external hemorrhoid tags, likely source of hematochezia, normal rectum, multiple colonic polyps at the base of the cecum, all less than 5 mm, single 4 mm sigmoid polyp, scattered diverticula.  Pathology revealed tubular adenoma in the cecum, hyperplastic polyp in the sigmoid colon.  Recommended repeat colonoscopy in 3 years.   COLONOSCOPY WITH PROPOFOL N/A 11/30/2021   Procedure: COLONOSCOPY WITH PROPOFOL;  Surgeon: Corbin Ade, MD;  Location: [facility];  Service: Endoscopy;  Laterality: N/A;  1:45pm   HEMORRHOID SURGERY  11/10/2011   Procedure: HEMORRHOIDECTOMY;  Surgeon: Fabio Bering, MD;  Location: [facility];  Service: General;  Laterality: N/A;   IR RADIOLOGIST EVAL & MGMT  04/17/2023   LAPAROSCOPIC GASTRIC SLEEVE RESECTION  01/15/2015   LAPAROSCOPIC NEPHRECTOMY N/A 09/03/2015   Procedure: LAPAROSCOPIC RADICAL NEPHRECTOMY;  Surgeon: Crist Fat, MD;  Location: [facility];  Service: Urology;  Laterality: N/A;   LUMBAR PERCUTANEOUS PEDICLE SCREW 4 LEVEL N/A 10/11/2022   Procedure: OPEN REDUCTION INTERNAL FIXATION OF THORACIC SIX PATHOLOGIC FRACTURE;  Surgeon: Coletta Memos, MD;  Location: [facility];  Service: Neurosurgery;  Laterality: N/A;   POLYPECTOMY  11/30/2021   Procedure: POLYPECTOMY;  Surgeon: Corbin Ade, MD;  Location: [facility];  Service: Endoscopy;;   TUBAL LIGATION     with last c-section   VERTEBROPLASTY N/A 09/09/2021   Procedure: Thoracic vertebral biopsy;  Surgeon: Coletta Memos, MD;  Location: [facility];  Service: Neurosurgery;  Laterality: N/A;  RM 21    REVIEW OF SYSTEMS:  Constitutional: positive for fatigue Eyes: negative Ears, nose, mouth, throat, and face: negative Respiratory: negative Cardiovascular: negative Gastrointestinal: negative Genitourinary:negative Integument/breast: negative Hematologic/lymphatic:  negative Musculoskeletal:positive for back pain Neurological: negative Behavioral/Psych: negative Endocrine: negative Allergic/Immunologic: negative   PHYSICAL EXAMINATION: General appearance: alert, cooperative, fatigued, and no distress Head: Normocephalic, without obvious abnormality, atraumatic Neck: no adenopathy, no JVD, supple, symmetrical, trachea midline, and thyroid not enlarged, symmetric, no tenderness/mass/nodules Lymph nodes: Cervical, supraclavicular, and axillary nodes normal. Resp: clear to auscultation bilaterally Back: symmetric, no curvature. ROM normal. No CVA tenderness. Cardio: regular rate and rhythm, S1, S2 normal, no murmur, click, rub or gallop GI: soft, non-tender; bowel sounds normal; no masses,  no organomegaly Extremities: extremities normal, atraumatic, no cyanosis or edema Neurologic: Alert and oriented X 3, normal strength and tone. Normal symmetric reflexes. Normal coordination and gait  ECOG PERFORMANCE STATUS: 1 - Symptomatic but completely ambulatory  Blood pressure 128/79, pulse 84, temperature (!) 97.5 F (36.4 C), temperature source Oral, resp. rate 17, height 5\' 7"  (1.702 m), weight 189 lb 3.2 oz (85.8 kg), last menstrual period 10/27/2013, SpO2 100%.  LABORATORY DATA: Lab Results  Component Value Date   WBC 4.1 05/02/2023   HGB 11.9 (L) 05/02/2023   HCT 37.3 05/02/2023   MCV 88.0 05/02/2023   PLT 239 05/02/2023  Chemistry      Component Value Date/Time   NA 139 04/08/2023 1306   K 4.4 04/08/2023 1306   CL 106 04/08/2023 1306   CO2 25 04/08/2023 1306   BUN 22 (H) 04/08/2023 1306   CREATININE 1.17 (H) 04/08/2023 1306   CREATININE 1.21 (H) 01/04/2023 0903      Component Value Date/Time   CALCIUM 9.2 04/08/2023 1306   ALKPHOS 50 04/08/2023 1306   AST 15 04/08/2023 1306   AST 15 01/04/2023 0903   ALT 10 04/08/2023 1306   ALT 6 01/04/2023 0903   BILITOT 0.7 04/08/2023 1306   BILITOT 0.6 01/04/2023 8119        RADIOGRAPHIC STUDIES: NM PET Image Restage (PS) Skull Base to Thigh (F-18 FDG)  Result Date: 04/30/2023 CLINICAL DATA:  Subsequent treatment strategy for renal cell carcinoma. Status post right nephrectomy. EXAM: NUCLEAR MEDICINE PET SKULL BASE TO THIGH TECHNIQUE: 9.46 mCi F-18 FDG was injected intravenously. Full-ring PET imaging was performed from the skull base to thigh after the radiotracer. CT data was obtained and used for attenuation correction and anatomic localization. Fasting blood glucose: 80 mg/dl COMPARISON:  PET-CT 14/78/2956 FINDINGS: Mediastinal blood pool activity: SUV max 2.35 Liver activity: SUV max NA NECK: No hypermetabolic lymph nodes in the neck. Mild hypermetabolism associated with the thyroid gland with SUV max of 4.49. This is likely mild thyroiditis. Incidental CT findings: None. CHEST: No hypermetabolic mediastinal or hilar nodes. No suspicious pulmonary nodules on the CT scan. Incidental CT findings: Stable atherosclerotic calcifications involving the aorta and coronary arteries. Moderate artifact associated with the thoracic spine fusion hardware. ABDOMEN/PELVIS: Status post right nephrectomy. No findings for residual or recurrent tumor in the right nephrectomy bed. No findings for metastatic disease involving the abdomen/pelvis. Incidental CT findings: Stable vascular calcifications. Status post cholecystectomy. Evidence prior gastric surgery. Moderate sigmoid colon diverticulosis. SKELETON: Mild hypermetabolism associated with the known T6 destructive bone lesions with surrounding fixation hardware. SUV max is 4.00. No findings for new metastatic bone disease. Incidental CT findings: None. IMPRESSION: 1. Status post right nephrectomy for renal cell carcinoma. No findings for residual or recurrent tumor in the right nephrectomy bed. 2. No findings for metastatic disease involving the chest, abdomen or pelvis. 3. Mild residual hypermetabolism associated with the T6 lesion  but no findings for new osseous metastatic disease. 4. Mild hypermetabolism associated with the thyroid gland likely mild thyroiditis. Aortic Atherosclerosis (ICD10-I70.0). Electronically Signed   By: Rudie Meyer M.D.   On: 04/30/2023 12:45   IR Radiologist Eval & Mgmt  Result Date: 04/17/2023 EXAM: NEW PATIENT OFFICE VISIT CHIEF COMPLAINT: Refer to EMR HISTORY OF PRESENT ILLNESS: Patient was seen in consultation for consideration of T6 biopsy and possible OsteoCool for treatment of symptomatic back pain related to metastatic renal cell carcinoma. Patient was initially diagnosed with renal cancer in 2017 and is status post nephrectomy at that time which point it was thought to be local disease only. She developed back pain in early 2023, and surgical biopsy at that time demonstrated metastatic RCC at T6. This was followed by single session stereotactic radiosurgery in March 2023, although progressive height loss and compression fracture at T6 necessitated T4-T8 posterior fusion in March 2024. Since that time, she has continued progressive and at times severe mid back pain. The pain is particularly bothersome with most movements and repositioning. She currently takes Tylenol predominantly for pain, which does reduce the pain from 7/10 to 3-4/10. She occasionally uses a muscle relaxer, but tends to  avoid any narcotic pain medicine due to her work, and trying to maintain an active lifestyle. She does report significant disability on the [employer] disability questionnaire with 18/24 positive. Per the patient and referring oncologist, the patient is planning to start external beam radiation therapy with Dr. Kathrynn Running, with 5 sessions planned between October 8 - October 18. While OsteoCool is indicated in the setting of osseous metastatic disease with symptomatic back pain, there are several factors that make treatment with percutaneous therapy challenging in this case. The underlying tumor is RCC, which sent to  be vascular lesions and therefore slightly increased the risk of having an epidural hematoma which can be a surgical emergency in a high to midthoracic vertebral bodies such as T6. Additionally, presence of posterior fusion hardware may make cannula placement challenging or impossible. These were discussed in detail with patient. Given that she is proceeding with external beam radiation, which may potentially alleviate her back pain alone, we agreed to reassess her symptoms following external beam radiation in early November. Given that the patient has had previous surgical biopsy at T6, Dr. Arbutus Ped was agreeable to foregoing the biopsy at this time. REVIEW OF SYSTEMS: Refer to EMR PHYSICAL EXAMINATION: Refer to EMR ASSESSMENT AND PLAN: Refer to EMR Electronically Signed   By: Olive Bass M.D.   On: 04/17/2023 16:07   MR THORACIC SPINE W WO CONTRAST  Result Date: 04/08/2023 CLINICAL DATA:  Back pain. History of metastatic renal cell carcinoma EXAM: MRI THORACIC WITHOUT AND WITH CONTRAST TECHNIQUE: Multiplanar and multiecho pulse sequences of the thoracic spine were obtained without and with intravenous contrast. CONTRAST:  8mL GADAVIST GADOBUTROL 1 MMOL/ML IV SOLN COMPARISON:  CT 04/08/2023, MRI 05/31/2022 FINDINGS: Alignment:  Physiologic. Vertebrae: Postsurgical changes from posterior spinal fusion spanning from T4-T8. Progressive marrow replacement of the T6 vertebral body with diffuse low T1 marrow signal throughout the vertebral body with avid enhancement on postcontrast imaging. There is significant bulging of the posterior vertebral body. Small amount of enhancing epidural tumor within the anterior epidural space at the T5 and T6 level (series 27, image 10). Enhancing extraosseous soft tissue extends into the left-sided paravertebral soft tissues that extends inferiorly to the T7 level (series 27, images 15-17). Chronic compression fracture of the T6 vertebral body without significant progression of  height loss. There are no additional marrow replacing bone lesions. No additional fractures. No evidence of discitis. Cord:  Normal signal and morphology. Paraspinal and other soft tissues: Left paravertebral extraosseous tumor at the T6 and T7 levels, as above. Remaining paraspinal soft tissues are within normal limits. Disc levels: Posterior bowing of the T6 vertebral body and epidural tumor results in impress upon the ventral thecal sac, although no significant canal stenosis at this level. No significant canal or foraminal stenosis at any level within the thoracic spine. IMPRESSION: 1. Progressive marrow replacement of the T6 vertebral body with significant bulging of the posterior vertebral body and small amount of enhancing epidural tumor within the anterior epidural space at the T5 and T6 level. Enhancing extraosseous soft tissue extends into the left-sided paravertebral soft tissues that extends inferiorly to the T7 level. Findings are compatible with known metastatic disease. 2. No significant canal stenosis or cord compression at this level. 3. Chronic compression fracture of the T6 vertebral body without significant progression of height loss. 4. No new metastatic lesions within the thoracic spine. Electronically Signed   By: Duanne Guess D.O.   On: 04/08/2023 18:38   CT THORACIC SPINE W  CONTRAST  Result Date: 04/08/2023 CLINICAL DATA:  Metastatic disease evaluation Mid-back pain, prior compression fracture History of renal cell carcinoma history of bony metastasis to the T6 vertebrae in the past. Patient with pain at the same location. EXAM: CT THORACIC SPINE WITH CONTRAST TECHNIQUE: Multidetector CT images of thoracic was performed according to the standard protocol following intravenous contrast administration. RADIATION DOSE REDUCTION: This exam was performed according to the departmental dose-optimization program which includes automated exposure control, adjustment of the mA and/or kV  according to patient size and/or use of iterative reconstruction technique. CONTRAST:  OMNIPAQUE IOHEXOL 300 MG/ML  SOLN COMPARISON:  CT 08/18/2022 FINDINGS: Alignment: Mildly exaggerated thoracic kyphosis. No traumatic listhesis. Vertebrae: Chronic pathologic fracture of the T6 vertebral body with approximately 50% vertebral body height loss, similar to the previous exam. Interval posterior instrumented fusion of the midthoracic spine spanning from T4-T8. Hardware intact without evidence of loosening. No new fractures. No new lytic or sclerotic bone lesions are identified. Paraspinal and other soft tissues: Aortic and coronary artery atherosclerosis. Small hiatal hernia. Disc levels: Increased soft tissue density posterior to the posterior wall of the T6 vertebral body narrowing the thoracic canal at this level (series 7, image 84; series 11, image 50). This finding was not evident on the previous exam. There is also increased soft tissue density in the paraspinal soft tissues adjacent to the T6 vertebral body. IMPRESSION: 1. Chronic pathologic fracture of the T6 vertebral body with approximately 50% vertebral body height loss, similar to the previous exam. 2. Increased soft tissue density posterior to the posterior wall of the T6 vertebral body narrowing the thoracic canal at this level. There is also increased soft tissue density in the paraspinal soft tissues adjacent to the T6 vertebral body. Findings are concerning for tumor recurrence. Further evaluation with contrast-enhanced MRI of the thoracic spine is recommended to assess for potential canal stenosis at this level. 3. Interval posterior instrumented fusion of the midthoracic spine spanning from T4-T8. Hardware intact without evidence of loosening. 4. Aortic and coronary artery atherosclerosis (ICD10-I70.0). Electronically Signed   By: Duanne Guess D.O.   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   Initially diagnosed with localized disease in 2017.  She is status post laparoscopic right radical nephrectomy performed in February 2017.  She was found to have 6.5 cm clear-cell renal cell carcinoma.  This was followed by surgical biopsy completed by Dr. Franky Macho on September 09, 2021 of the T6 spinal lesion which confirmed the presence of metastatic renal cell carcinoma. She also has spinal stereotactic radiosurgery completed on September 27, 2021 for isolated metastatic lesion of the T6 spine. The patient is currently on treatment with Pembrolizumab 200 mg every 3 weeks cycle 1 started on Nov 18, 2021.  She received 400 mg starting with cycle 4 on January 20, 2022.  She completed 1 year of treatment in March 2024.  She has been in observation since that time.    Stage IV Renal Cell Carcinoma with metastasis to T6 vertebra Completed one year of treatment with Keytruda in March 2024. Currently undergoing radiation therapy for T6 lesion (4th session today).  - PET scan: No findings for residual or recurrent tumor in right nephrectomy bed. No findings of metastatic disease involving chest, abdomen, or  pelvis. Mild residual hypermetabolism in T6 lesion. Mild hypermetabolism associated with thyroid gland. SUV 4.4 in T6 lesion, mediastinal pool SUV 2.35. - PET scan shows no evidence of spread of the tumor elsewhere. Mild residual hypermetabolism at T6 lesion. -Continue radiation therapy as planned. -Plan for regular CT scans every three months to monitor for potential spread. -Consider restarting immunotherapy or combination therapy if evidence of recurrence.  Pain Management Reports significant back pain related to T6 lesion. Current pain management strategy inadequate. -Refer to palliative care team for optimized pain management strategy.  Thyroid  Function Awaiting results of thyroid function tests. -Adjust thyroid medication dosage as necessary once results are available.  Follow-up -Plan for MRI in December 2024 to assess response to radiation therapy. -Return to clinic in three months for follow-up and imaging.   The patient was advised to call immediately if she has any other concerning symptoms in the interval. The patient voices understanding of current disease status and treatment options and is in agreement with the current care plan.  All questions were answered. The patient knows to call the clinic with any problems, questions or concerns. We can certainly see the patient much sooner if necessary.  The total time spent in the appointment was 30 minutes.  Disclaimer: This note was dictated with voice recognition software. Similar sounding words can inadvertently be transcribed and may not be corrected upon review.

## 2023-05-04 ENCOUNTER — Telehealth: Payer: Self-pay | Admitting: Internal Medicine

## 2023-05-04 ENCOUNTER — Telehealth: Payer: Self-pay | Admitting: Nurse Practitioner

## 2023-05-04 ENCOUNTER — Ambulatory Visit
Admission: RE | Admit: 2023-05-04 | Discharge: 2023-05-04 | Disposition: A | Discharge status: Home / Self Care | Payer: BC Managed Care – PPO | Source: Ambulatory Visit | Attending: Radiation Oncology | Admitting: Radiation Oncology

## 2023-05-04 ENCOUNTER — Other Ambulatory Visit: Payer: Self-pay

## 2023-05-04 DIAGNOSIS — C7951 Secondary malignant neoplasm of bone: Secondary | ICD-10-CM | POA: Diagnosis not present

## 2023-05-04 LAB — RAD ONC ARIA SESSION SUMMARY
Course Elapsed Days: 10
Plan Fractions Treated to Date: 5
Plan Prescribed Dose Per Fraction: 10 Gy
Plan Total Fractions Prescribed: 5
Plan Total Prescribed Dose: 50 Gy
Reference Point Dosage Given to Date: 50 Gy
Reference Point Session Dosage Given: 10 Gy
Session Number: 5

## 2023-05-04 NOTE — Telephone Encounter (Signed)
Scheduled per 10/16 los, patient has been called and notified of January appointments.

## 2023-05-06 NOTE — Progress Notes (Unsigned)
Palliative Medicine [facility]  Telephone:(336) [phone] Fax:(336) [phone]   Name: Dana Hanson Date: 05/06/2023 MRN: [id number]  DOB: 03-Jul-1961  Patient Care Team: [name], MD as PCP - General (Internal Medicine) Jena Gauss, Gerrit Friends, MD (Gastroenterology) Si Gaul, MD as Consulting Physician (Oncology)    REASON FOR CONSULTATION: Dana Hanson is a 62 y.o. 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 Palliative ask to see for symptom management and goals of care.    SOCIAL HISTORY:     reports that she has never smoked. She has never used smokeless tobacco. She reports that she does not drink alcohol and does not use drugs.  ADVANCE DIRECTIVES: None   CODE STATUS: Full code  PAST MEDICAL HISTORY: Past Medical History:  Diagnosis Date  . Arthritis    right knee  . Cancer (HCC)    kidney  . Gallstone pancreatitis   . Hyperlipidemia   . Hypothyroidism    due to [facility]  . IBS (irritable bowel syndrome)    patient denies  . Right renal mass     PAST SURGICAL HISTORY:  Past Surgical History:  Procedure Laterality Date  . CESAREAN SECTION     x 2-tubal with last c-section  . CHOLECYSTECTOMY N/A 09/03/2015   Procedure: LAPAROSCOPIC CHOLECYSTECTOMY WITH INTRAOPERATIVE CHOLANGIOGRAM;  Surgeon: Rodman Pickle, MD;  Location: [facility] ORS;  Service: General;  Laterality: N/A;  . COLONOSCOPY  08/14/2011   Surgeon: Corbin Ade, MD; external hemorrhoid tags, likely source of hematochezia, normal rectum, multiple colonic polyps at the base of the cecum, all less than 5 mm, single 4 mm sigmoid polyp, scattered diverticula.  Pathology revealed tubular adenoma in the cecum, hyperplastic polyp in the sigmoid colon.  Recommended repeat colonoscopy in 3 years.  . COLONOSCOPY WITH PROPOFOL N/A 11/30/2021   Procedure: COLONOSCOPY WITH PROPOFOL;  Surgeon: Corbin Ade, MD;   Location: [facility];  Service: Endoscopy;  Laterality: N/A;  1:45pm  . HEMORRHOID SURGERY  11/10/2011   Procedure: HEMORRHOIDECTOMY;  Surgeon: Fabio Bering, MD;  Location: [facility] ORS;  Service: General;  Laterality: N/A;  . IR RADIOLOGIST EVAL & MGMT  04/17/2023  . LAPAROSCOPIC GASTRIC SLEEVE RESECTION  01/15/2015  . LAPAROSCOPIC NEPHRECTOMY N/A 09/03/2015   Procedure: LAPAROSCOPIC RADICAL NEPHRECTOMY;  Surgeon: Crist Fat, MD;  Location: [facility];  Service: Urology;  Laterality: N/A;  . LUMBAR PERCUTANEOUS PEDICLE SCREW 4 LEVEL N/A 10/11/2022   Procedure: OPEN REDUCTION INTERNAL FIXATION OF THORACIC SIX PATHOLOGIC FRACTURE;  Surgeon: Coletta Memos, MD;  Location: [facility] OR;  Service: Neurosurgery;  Laterality: N/A;  . POLYPECTOMY  11/30/2021   Procedure: POLYPECTOMY;  Surgeon: Corbin Ade, MD;  Location: [facility];  Service: Endoscopy;;  . TUBAL LIGATION     with last c-section  . VERTEBROPLASTY N/A 09/09/2021   Procedure: Thoracic vertebral biopsy;  Surgeon: Coletta Memos, MD;  Location: [facility] OR;  Service: Neurosurgery;  Laterality: N/A;  RM 21    HEMATOLOGY/ONCOLOGY HISTORY:  Oncology History  Cancer of right kidney (HCC)  09/03/2015 Initial Diagnosis   Cancer of right kidney (HCC)   09/03/2015 Cancer Staging   Staging form: Kidney, AJCC 7th Edition - Clinical stage from 09/03/2015: Stage I (T1b, N0, M0) - Signed by Margaretmary Dys, MD on 09/27/2021 Staged by: Managing physician Diagnostic confirmation: Positive microscopic confirmation, method not specified Specimen type: Excision Histopathologic type: Renal cell carcinoma, NOS Stage prefix: Initial diagnosis  Biopsy of metastatic site performed: No Histologic grade (G): G4 Residual tumor (R): R0 - None Invasion beyond capsule into fat or perisinus tissues: Absent   11/18/2021 - 01/20/2022 Chemotherapy   Patient is on Treatment Plan : BLADDER Pembrolizumab (200) q21d     01/20/2022 - 10/05/2022 Chemotherapy   Patient is on  Treatment Plan : ANUS Pembrolizumab (400) q42d       ALLERGIES:  has No Known Allergies.  MEDICATIONS:  Current Outpatient Medications  Medication Sig Dispense Refill  . acetaminophen (TYLENOL) 500 MG tablet Take 500-1,000 mg by mouth every 6 (six) hours as needed (pain.).    Marland Kitchen levothyroxine (SYNTHROID) 50 MCG tablet Take 1 tablet (50 mcg total) by mouth daily before breakfast. 30 tablet 5  . Multiple Vitamin (MULITIVITAMIN WITH MINERALS) TABS Take 1 tablet by mouth in the morning.    Marland Kitchen oxyCODONE-acetaminophen (PERCOCET/ROXICET) 5-325 MG tablet Take 1 tablet by mouth every 6 (six) hours as needed for severe pain. 10 tablet 0  . pregabalin (LYRICA) 100 MG capsule Take 100 mg by mouth 3 (three) times daily.    . pregabalin (LYRICA) 75 MG capsule Take 75 mg by mouth 3 (three) times daily.    Marland Kitchen tiZANidine (ZANAFLEX) 4 MG tablet Take 1 tablet (4 mg total) by mouth every 6 (six) hours as needed for muscle spasms. 60 tablet 0  . triamcinolone cream (KENALOG) 0.1 % Apply 1 Application topically daily as needed (skin rash/irritation.).     No current facility-administered medications for this visit.    VITAL SIGNS: LMP 10/27/2013  There were no vitals filed for this visit.  Estimated body mass index is 29.63 kg/m as calculated from the following:   Height as of 05/02/23: 5\' 7"  (1.702 m).   Weight as of 05/02/23: 189 lb 3.2 oz (85.8 kg).  LABS: CBC:    Component Value Date/Time   WBC 4.1 05/02/2023 1030   WBC 4.9 04/08/2023 1306   HGB 11.9 (L) 05/02/2023 1030   HCT 37.3 05/02/2023 1030   PLT 239 05/02/2023 1030   MCV 88.0 05/02/2023 1030   NEUTROABS 2.8 05/02/2023 1030   LYMPHSABS 0.8 05/02/2023 1030   MONOABS 0.3 05/02/2023 1030   EOSABS 0.2 05/02/2023 1030   BASOSABS 0.0 05/02/2023 1030   Comprehensive Metabolic Panel:    Component Value Date/Time   NA 141 05/02/2023 1030   K 4.2 05/02/2023 1030   CL 106 05/02/2023 1030   CO2 27 05/02/2023 1030   BUN 23 (H) 05/02/2023 1030    CREATININE 1.16 (H) 05/02/2023 1030   GLUCOSE 99 05/02/2023 1030   CALCIUM 9.5 05/02/2023 1030   AST 14 (L) 05/02/2023 1030   ALT 10 05/02/2023 1030   ALKPHOS 55 05/02/2023 1030   BILITOT 0.7 05/02/2023 1030   PROT 7.0 05/02/2023 1030   ALBUMIN 4.0 05/02/2023 1030    RADIOGRAPHIC STUDIES: NM PET Image Restage (PS) Skull Base to Thigh (F-18 FDG)  Result Date: 04/30/2023 CLINICAL DATA:  Subsequent treatment strategy for renal cell carcinoma. Status post right nephrectomy. EXAM: NUCLEAR MEDICINE PET SKULL BASE TO THIGH TECHNIQUE: 9.46 mCi F-18 FDG was injected intravenously. Full-ring PET imaging was performed from the skull base to thigh after the radiotracer. CT data was obtained and used for attenuation correction and anatomic localization. Fasting blood glucose: 80 mg/dl COMPARISON:  PET-CT 21/30/8657 FINDINGS: Mediastinal blood pool activity: SUV max 2.35 Liver activity: SUV max NA NECK: No hypermetabolic lymph nodes in the neck. Mild hypermetabolism associated with the thyroid gland  with SUV max of 4.49. This is likely mild thyroiditis. Incidental CT findings: None. CHEST: No hypermetabolic mediastinal or hilar nodes. No suspicious pulmonary nodules on the CT scan. Incidental CT findings: Stable atherosclerotic calcifications involving the aorta and coronary arteries. Moderate artifact associated with the thoracic spine fusion hardware. ABDOMEN/PELVIS: Status post right nephrectomy. No findings for residual or recurrent tumor in the right nephrectomy bed. No findings for metastatic disease involving the abdomen/pelvis. Incidental CT findings: Stable vascular calcifications. Status post cholecystectomy. Evidence prior gastric surgery. Moderate sigmoid colon diverticulosis. SKELETON: Mild hypermetabolism associated with the known T6 destructive bone lesions with surrounding fixation hardware. SUV max is 4.00. No findings for new metastatic bone disease. Incidental CT findings: None. IMPRESSION: 1.  Status post right nephrectomy for renal cell carcinoma. No findings for residual or recurrent tumor in the right nephrectomy bed. 2. No findings for metastatic disease involving the chest, abdomen or pelvis. 3. Mild residual hypermetabolism associated with the T6 lesion but no findings for new osseous metastatic disease. 4. Mild hypermetabolism associated with the thyroid gland likely mild thyroiditis. Aortic Atherosclerosis (ICD10-I70.0). Electronically Signed   By: Rudie Meyer M.D.   On: 04/30/2023 12:45   PERFORMANCE STATUS (ECOG) : {CHL ONC ECOG HQ:[id number]}  Review of Systems Unless otherwise noted, a complete review of systems is negative.  Physical Exam General: NAD Cardiovascular: regular rate and rhythm Pulmonary: clear ant fields Abdomen: soft, nontender, + bowel sounds Extremities: no edema, no joint deformities Skin: no rashes Neurological:  IMPRESSION: This is my initial visit with Ms. Kaplowitz. She is ambulatory. No acute distress. Alert and able to engage in discussions appropriately.   I introduced myself, Maygan RN, and Palliative's role in collaboration with the oncology team. Concept of Palliative Care was introduced as specialized medical care for people and their families living with serious illness.  It focuses on providing relief from the symptoms and stress of a serious illness.  The goal is to improve quality of life for both the patient and the family. Values and goals of care important to patient and family were attempted to be elicited.    We discussed her current illness and what it means in the larger context of her on-going co-morbidities. Natural disease trajectory and expectations were discussed.  I discussed the importance of continued conversation with family and their medical providers regarding overall plan of care and treatment options, ensuring decisions are within the context of the patients values and GOCs.  PLAN: Established therapeutic  relationship. Education provided on palliative's role in collaboration with their Oncology/Radiation team. I will plan to see patient back in 2-4 weeks in collaboration to other oncology appointments.    Patient expressed understanding and was in agreement with this plan. She also understands that She can call the clinic at any time with any questions, concerns, or complaints.   Thank you for your referral and allowing Palliative to assist in Mrs. Alfie Svetlik Ballew's care.   Number and complexity of problems addressed: ***HIGH - 1 or more chronic illnesses with SEVERE exacerbation, progression, or side effects of treatment - advanced cancer, pain. Any controlled substances utilized were prescribed in the context of palliative care.   Visit consisted of counseling and education dealing with the complex and emotionally intense issues of symptom management and palliative care in the setting of serious and potentially life-threatening illness.Greater than 50%  of this time was spent counseling and coordinating care related to the above assessment and plan.  Signed by: Willette Alma, AGPCNP-BC  Palliative Medicine Team/[facility]   *Please note that this is a verbal dictation therefore any spelling or grammatical errors are due to the "Dragon Medical One" system interpretation.

## 2023-05-07 ENCOUNTER — Inpatient Hospital Stay (HOSPITAL_BASED_OUTPATIENT_CLINIC_OR_DEPARTMENT_OTHER): Payer: BC Managed Care – PPO | Admitting: Nurse Practitioner

## 2023-05-07 ENCOUNTER — Encounter: Payer: Self-pay | Admitting: Nurse Practitioner

## 2023-05-07 VITALS — BP 118/65 | HR 87 | Temp 98.0°F | Resp 18 | Ht 67.0 in | Wt 191.5 lb

## 2023-05-07 DIAGNOSIS — C7951 Secondary malignant neoplasm of bone: Secondary | ICD-10-CM

## 2023-05-07 DIAGNOSIS — M62838 Other muscle spasm: Secondary | ICD-10-CM

## 2023-05-07 DIAGNOSIS — R1013 Epigastric pain: Secondary | ICD-10-CM

## 2023-05-07 DIAGNOSIS — G893 Neoplasm related pain (acute) (chronic): Secondary | ICD-10-CM

## 2023-05-07 DIAGNOSIS — C649 Malignant neoplasm of unspecified kidney, except renal pelvis: Secondary | ICD-10-CM

## 2023-05-07 DIAGNOSIS — Z515 Encounter for palliative care: Secondary | ICD-10-CM | POA: Diagnosis not present

## 2023-05-07 MED ORDER — CELECOXIB 50 MG PO CAPS
50.0000 mg | ORAL_CAPSULE | Freq: Every day | ORAL | 0 refills | Status: AC
Start: 2023-05-07 — End: ?

## 2023-05-07 MED ORDER — PANTOPRAZOLE SODIUM 20 MG PO TBEC: 20.0000 mg | DELAYED_RELEASE_TABLET | Freq: Every day | ORAL | 0 refills | Status: DC

## 2023-05-07 MED ORDER — CYCLOBENZAPRINE HCL 5 MG PO TABS: 5.0000 mg | ORAL_TABLET | Freq: Three times a day (TID) | ORAL | 0 refills | Status: AC | PRN

## 2023-05-07 MED ORDER — TRAMADOL HCL 50 MG PO TABS
50.0000 mg | ORAL_TABLET | Freq: Four times a day (QID) | ORAL | 0 refills | Status: AC | PRN
Start: 2023-05-07 — End: ?

## 2023-05-07 NOTE — Radiation Completion Notes (Signed)
Patient Name: Dana Hanson, Dana Hanson MRN: [id number] Date of Birth: 10-Sep-1960 Referring Physician: Si Gaul, M.D. Date of Service: 2023-05-07 Radiation Oncologist: Margaretmary Bayley, M.D. [facility]                             RADIATION ONCOLOGY END OF TREATMENT NOTE     Diagnosis: C79.51 Secondary malignant neoplasm of bone Staging on 2015-09-03: Cancer of right kidney (HCC) T=T1b, N=N0, M=M0 Intent: Palliative     ==========DELIVERED PLANS==========  First Treatment Date: 2023-04-24 - Last Treatment Date: 2023-05-04   Plan Name: Spine_T_SBRT Site: Thoracic Spine Technique: SBRT/SRT-IMRT Mode: Photon Dose Per Fraction: 10 Gy Prescribed Dose (Delivered / Prescribed): 50 Gy / 50 Gy Prescribed Fxs (Delivered / Prescribed): 5 / 5     ==========ON TREATMENT VISIT DATES========== 2023-04-24, 2023-04-26, 2023-04-30, 2023-05-02, 2023-05-04     ==========UPCOMING VISITS==========       ==========APPENDIX - ON TREATMENT VISIT NOTES==========   See weekly On Treatment Notes in Epic for details.

## 2023-05-07 NOTE — Patient Instructions (Addendum)
-   stop taking your tizanidine (Zanaflex) muscle relaxer  - pick up and start taking cyclobenzaprine (flexeril) for a new muscle relaxer, take as needed - pick up and take celecoxib (Celebrex) daily to help with pain  - start taking tramadol (ultram) during the day for your pain as needed, start taking 1 pill every 6 hours as needed if you find that you tolerate the 1 pill but your pain is not controlled you may take 1 or 2 pills, but start with the 1 to see how you tolerate it. Do not start your tramadol until Wednesday to ensure you tolerate the Celebrex and flexeril  - you may continue your oxycodone-acetaminophen (percocet) as needed for severe pain  - take muscle relaxer, Celebrex, and pain medication at least 2 hours apart to prevent excessive drowsiness  - start taking Protonix daily to help with the indigestion/radiation burn in your esophagus  - if you develop any rashes or are over drowsy please stop taking the new medications and call us, if you have any sudden shortness of breath or loss of consciousness, call 911 - we will have a phone follow up next Monday to see how you are tolerating everything.
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
## 2023-05-14 ENCOUNTER — Encounter: Payer: Self-pay | Admitting: Nurse Practitioner

## 2023-05-14 ENCOUNTER — Inpatient Hospital Stay (HOSPITAL_BASED_OUTPATIENT_CLINIC_OR_DEPARTMENT_OTHER): Payer: BC Managed Care – PPO | Admitting: Nurse Practitioner

## 2023-05-14 DIAGNOSIS — R530 Neoplastic (malignant) related fatigue: Secondary | ICD-10-CM

## 2023-05-14 DIAGNOSIS — C7951 Secondary malignant neoplasm of bone: Secondary | ICD-10-CM | POA: Diagnosis not present

## 2023-05-14 DIAGNOSIS — G893 Neoplasm related pain (acute) (chronic): Secondary | ICD-10-CM

## 2023-05-14 DIAGNOSIS — Z515 Encounter for palliative care: Secondary | ICD-10-CM

## 2023-05-14 DIAGNOSIS — K209 Esophagitis, unspecified without bleeding: Secondary | ICD-10-CM

## 2023-05-14 DIAGNOSIS — C649 Malignant neoplasm of unspecified kidney, except renal pelvis: Secondary | ICD-10-CM

## 2023-05-14 DIAGNOSIS — R630 Anorexia: Secondary | ICD-10-CM

## 2023-05-14 MED ORDER — SUCRALFATE 1 GM/10ML PO SUSP
1.0000 g | Freq: Three times a day (TID) | ORAL | 0 refills | Status: DC
Start: 2023-05-14 — End: 2024-02-25

## 2023-06-08 NOTE — Progress Notes (Unsigned)
Palliative Medicine [facility]  [phone] [phone]) [phone]   Name: Dana Hanson Date: 06/08/2023 MRN: [id number]  DOB: 06/20/1961  Patient Care Team: [name], MD as PCP - General (Internal Medicine) Jena Gauss, Gerrit Friends, MD (Gastroenterology) Si Gaul, MD as Consulting Physician (Oncology) Pickenpack-Cousar, Arty Baumgartner, NP as Nurse Practitioner [facility] and Palliative Medicine)   I connected with Dana Hanson on 06/08/23 at  2:30 PM EST by phone and verified that I am speaking with the correct person using two identifiers.   I discussed the limitations, risks, security and privacy concerns of performing an evaluation and management service by telemedicine and the availability of in-person appointments. I also discussed with the patient that there may be a patient responsible charge related to this service. The patient expressed understanding and agreed to proceed.   Other persons participating in the visit and their role in the encounter: n/a   Patient's location: home  Provider's location: [facility]   Chief Complaint: f/u of pain management   INTERVAL HISTORY: Dana Hanson is a 62 y.o. 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 Palliative ask to see for symptom management and goals of care.   SOCIAL HISTORY:     reports that she has never smoked. She has never used smokeless tobacco. She reports that she does not drink alcohol and does not use drugs.  ADVANCE DIRECTIVES:  None on file  CODE STATUS: Full code  PAST MEDICAL HISTORY: Past Medical History:  Diagnosis Date   Arthritis    right knee   Cancer (HCC)    kidney   Gallstone pancreatitis    Hyperlipidemia    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   IBS (irritable bowel syndrome)    patient denies   Right renal mass     ALLERGIES:  has No Known Allergies.  MEDICATIONS:  Current Outpatient  Medications  Medication Sig Dispense Refill   acetaminophen (TYLENOL) 500 MG tablet Take 500-1,000 mg by mouth every 6 (six) hours as needed (pain.).     celecoxib (CELEBREX) 50 MG capsule Take 1 capsule (50 mg total) by mouth daily in the afternoon. 15 capsule 0   cyclobenzaprine (FLEXERIL) 5 MG tablet Take 1 tablet (5 mg total) by mouth 3 (three) times daily as needed for muscle spasms. 30 tablet 0   levothyroxine (SYNTHROID) 50 MCG tablet Take 1 tablet (50 mcg total) by mouth daily before breakfast. 30 tablet 5   Multiple Vitamin (MULITIVITAMIN WITH MINERALS) TABS Take 1 tablet by mouth in the morning.     oxyCODONE-acetaminophen (PERCOCET/ROXICET) 5-325 MG tablet Take 1 tablet by mouth every 6 (six) hours as needed for severe pain. 10 tablet 0   pantoprazole (PROTONIX) 20 MG tablet Take 1 tablet (20 mg total) by mouth daily. 30 tablet 0   pregabalin (LYRICA) 100 MG capsule Take 100 mg by mouth 3 (three) times daily.     sucralfate (CARAFATE) 1 GM/10ML suspension Take 10 mLs (1 g total) by mouth 4 (four) times daily -  with meals and at bedtime. 420 mL 0   traMADol (ULTRAM) 50 MG tablet Take 1 tablet (50 mg total) by mouth every 6 (six) hours as needed. 60 tablet 0   triamcinolone cream (KENALOG) 0.1 % Apply 1 Application topically daily as needed (skin rash/irritation.).     No current facility-administered medications for this visit.    VITAL SIGNS: LMP 10/27/2013  There  were no vitals filed for this visit.  Estimated body mass index is 29.99 kg/m as calculated from the following:   Height as of 05/07/23: 5\' 7"  (1.702 m).   Weight as of 05/07/23: 191 lb 8 oz (86.9 kg).   PERFORMANCE STATUS (ECOG) : 1 - Symptomatic but completely ambulatory   IMPRESSION:  I connected by phone with Dana Hanson for symptom management follow-up. She has a history of cancer, reports persistent back pain and ongoing esophageal discomfort following completion of radiation therapy. The back pain, while  still present, has shown some improvement with anti-inflammatory medication. 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 Despite daily use of a prescribed medication , presumably a proton pump inhibitor, the patient has not noticed a significant improvement in symptoms.  The esophageal discomfort is described as a burning sensation, located deep in the esophagus, which is particularly noticeable during eating. The pain is severe enough to cause coughing and a sensation of throat tightness, significantly impacting the patient's ability to eat and drink. She expresses hope that the esophageal discomfort will start to heal within the upcoming weeks.  In addition to the esophageal discomfort, the patient reports that despite feeling better overall, she experiences fatigue after performing activities, even those that are not physically demanding. This fatigue is significant enough to require rest periods and has led to a decrease in the patient's usual activities. Dana Hanson acknowledges the need to be patient and take things slowly, adjusting activities based on her body's response.  She is currently on a regimen of anti-inflammatory medication and a muscle relaxer, both of which have been well-tolerated, although the muscle relaxer is associated with vivid dreams. The patient has also been prescribed oxycodone, which is used sparingly.   Goals of Care  05/07/23- We discussed her current illness and what it means in the larger context of her on-going co-morbidities. Natural disease trajectory and expectations were discussed.   Dana Hanson is realistic in her understanding of her current cancer and treatment plan. She is remaining hopeful and positive. Is clear in expressed goals to take life one day at a time treating the treatable allowing her every opportunity to thrive with a good quality of life.     We discussed Her current illness and what it  means in the larger context of Her on-going co-morbidities. Natural disease trajectory and expectations were discussed.  I discussed the importance of continued conversation with family and their medical providers regarding overall plan of care and treatment options, ensuring decisions are within the context of the patients values and GOCs.  PLAN:  Radiation Esophagitis Persistent pain and coughing during eating despite Protonix use. Discussed the use of Carafate to provide a protective coating to the esophagus. -Prescribe Carafate liquid to be taken 30-45 minutes before meals and at bedtime. -Continue Protonix. -If Carafate liquid is cost-prohibitive, consider Carafate tablets dissolved in warm water.  Chronic Back Pain Mild improvement with anti-inflammatory medication. Patient is trying to limit use of stronger pain medications due to side effects. -Continue current anti-inflammatory medication. -Encourage patient to listen to her body and take breaks as needed to manage pain. - Flexeril for muscle spasms. - Tramadol for daytime pain control, to be taken as needed. -Continue Lyrica 100mg  three times a day. -Continue Percocet for severe pain as needed, primarily had to use at night. -Celebrex for inflammation and potential reduction in muscle spasms.  Patient expressed understanding and was in agreement with this  plan. She also understands that She can call the clinic at any time with any questions, concerns, or complaints.   Any controlled substances utilized were prescribed in the context of palliative care. PDMP has been reviewed.   Visit consisted of counseling and education dealing with the complex and emotionally intense issues of symptom management and palliative care in the setting of serious and potentially life-threatening illness.  Willette Alma, AGPCNP-BC  Palliative Medicine Team/[facility]

## 2023-06-11 ENCOUNTER — Encounter: Payer: Self-pay | Admitting: Nurse Practitioner

## 2023-06-11 ENCOUNTER — Inpatient Hospital Stay: Payer: BC Managed Care – PPO | Attending: Oncology | Admitting: Nurse Practitioner

## 2023-06-11 DIAGNOSIS — R1013 Epigastric pain: Secondary | ICD-10-CM

## 2023-06-11 DIAGNOSIS — Z515 Encounter for palliative care: Secondary | ICD-10-CM

## 2023-06-11 DIAGNOSIS — C649 Malignant neoplasm of unspecified kidney, except renal pelvis: Secondary | ICD-10-CM

## 2023-06-11 DIAGNOSIS — C7951 Secondary malignant neoplasm of bone: Secondary | ICD-10-CM

## 2023-06-11 DIAGNOSIS — G893 Neoplasm related pain (acute) (chronic): Secondary | ICD-10-CM

## 2023-06-11 MED ORDER — PANTOPRAZOLE SODIUM 20 MG PO TBEC: 20.0000 mg | DELAYED_RELEASE_TABLET | Freq: Every day | ORAL | 6 refills | Status: DC

## 2023-06-19 ENCOUNTER — Ambulatory Visit
Admission: RE | Admit: 2023-06-19 | Discharge: 2023-06-19 | Disposition: A | Discharge status: Home / Self Care | Payer: BC Managed Care – PPO | Source: Ambulatory Visit | Attending: Radiation Oncology | Admitting: Radiation Oncology

## 2023-06-19 DIAGNOSIS — K208 Other esophagitis without bleeding: Secondary | ICD-10-CM | POA: Insufficient documentation

## 2023-06-19 DIAGNOSIS — C7951 Secondary malignant neoplasm of bone: Secondary | ICD-10-CM | POA: Insufficient documentation

## 2023-06-19 DIAGNOSIS — Z79899 Other long term (current) drug therapy: Secondary | ICD-10-CM | POA: Insufficient documentation

## 2023-06-19 DIAGNOSIS — M62838 Other muscle spasm: Secondary | ICD-10-CM | POA: Insufficient documentation

## 2023-06-19 DIAGNOSIS — Z510 Encounter for antineoplastic radiation therapy: Secondary | ICD-10-CM | POA: Insufficient documentation

## 2023-06-19 DIAGNOSIS — Z905 Acquired absence of kidney: Secondary | ICD-10-CM | POA: Insufficient documentation

## 2023-06-19 DIAGNOSIS — R059 Cough, unspecified: Secondary | ICD-10-CM | POA: Insufficient documentation

## 2023-06-19 DIAGNOSIS — G893 Neoplasm related pain (acute) (chronic): Secondary | ICD-10-CM | POA: Insufficient documentation

## 2023-06-19 DIAGNOSIS — C641 Malignant neoplasm of right kidney, except renal pelvis: Secondary | ICD-10-CM | POA: Insufficient documentation

## 2023-06-19 NOTE — Progress Notes (Signed)
  Radiation Oncology         [phone]) [phone] ________________________________  Name: Dana Hanson MRN: [id number]  Date of Service: 06/19/2023  DOB: 1960/08/31  Post Treatment Telephone Note  Diagnosis:  C79.51 Secondary malignant neoplasm of bone (as documented in provider EOT note)  The patient was available for call today.  The patient did  note fatigue during radiation. The patient did not note skin changes in the field of radiation during therapy. The patient has noticed MINOR improvement in pain in the area(s) treated with radiation. The patient is not taking dexamethasone. The patient does not have symptoms of  weakness or loss of control of the extremities. The patient does not have symptoms of headache. The patient does not have symptoms of seizure or uncontrolled movement. The patient does not have symptoms of changes in vision. The patient does not have changes in speech. The patient does not have confusion.   The patient is scheduled for ongoing care with Dr. Arbutus Ped in medical oncology. The patient was encouraged to call if she develops concerns or questions regarding radiation.  This concludes the interaction.  Ruel Favors, LPN

## 2023-06-24 ENCOUNTER — Other Ambulatory Visit: Payer: Self-pay

## 2023-06-24 ENCOUNTER — Emergency Department (HOSPITAL_COMMUNITY): Payer: BC Managed Care – PPO

## 2023-06-24 ENCOUNTER — Encounter (HOSPITAL_COMMUNITY): Payer: Self-pay

## 2023-06-24 ENCOUNTER — Emergency Department (HOSPITAL_COMMUNITY)
Admission: EM | Admit: 2023-06-24 | Discharge: 2023-06-24 | Disposition: A | Discharge status: Home / Self Care | Payer: BC Managed Care – PPO | Attending: Emergency Medicine | Admitting: Emergency Medicine

## 2023-06-24 DIAGNOSIS — M546 Pain in thoracic spine: Secondary | ICD-10-CM | POA: Diagnosis present

## 2023-06-24 MED ORDER — GADOBUTROL 1 MMOL/ML IV SOLN
8.0000 mL | Freq: Once | INTRAVENOUS | Status: AC | PRN
  Administered 2023-06-24: 8 mL via INTRAVENOUS

## 2023-06-24 NOTE — ED Provider Notes (Signed)
EMERGENCY DEPARTMENT AT [facility] Provider Note   CSN: [id number] Arrival date & time: 06/24/23  1554     History  Chief Complaint  Patient presents with   Back Pain    Dana Hanson is a 62 y.o. female.  Patient with a history of thoracic (T6) metastasis (primary renal cell). She completed radiation therapy 10/24 and felt better at the time with less pain, easier mobility. For the past 2 weeks, she has had progressively worsening pain in the T6 area and is concerned the cancer is recurrent.    Back Pain      Home Medications Prior to Admission medications   Medication Sig Start Date End Date Taking? Authorizing Provider  acetaminophen (TYLENOL) 500 MG tablet Take 500-1,000 mg by mouth every 6 (six) hours as needed (pain.).    [provider]  celecoxib (CELEBREX) 50 MG capsule Take 1 capsule (50 mg total) by mouth daily in the afternoon. 05/07/23   Pickenpack-Cousar, Arty Baumgartner, NP  cyclobenzaprine (FLEXERIL) 5 MG tablet Take 1 tablet (5 mg total) by mouth 3 (three) times daily as needed for muscle spasms. 05/07/23   Pickenpack-Cousar, Arty Baumgartner, NP  levothyroxine (SYNTHROID) 50 MCG tablet Take 1 tablet (50 mcg total) by mouth daily before breakfast. 05/02/23   Si Gaul, MD  Multiple Vitamin (MULITIVITAMIN WITH MINERALS) TABS Take 1 tablet by mouth in the morning.    [provider]  oxyCODONE-acetaminophen (PERCOCET/ROXICET) 5-325 MG tablet Take 1 tablet by mouth every 6 (six) hours as needed for severe pain. 04/08/23   Eber Hong, MD  pantoprazole (PROTONIX) 20 MG tablet Take 1 tablet (20 mg total) by mouth daily. 06/11/23   Pickenpack-Cousar, Arty Baumgartner, NP  pregabalin (LYRICA) 100 MG capsule Take 100 mg by mouth 3 (three) times daily. 03/28/23   [provider]  sucralfate (CARAFATE) 1 GM/10ML suspension Take 10 mLs (1 g total) by mouth 4 (four) times daily -  with meals and at bedtime. 05/14/23   Pickenpack-Cousar,  Arty Baumgartner, NP  traMADol (ULTRAM) 50 MG tablet Take 1 tablet (50 mg total) by mouth every 6 (six) hours as needed. 05/07/23   Pickenpack-Cousar, Arty Baumgartner, NP  triamcinolone cream (KENALOG) 0.1 % Apply 1 Application topically daily as needed (skin rash/irritation.). 08/16/22   [provider]      Allergies    Patient has no known allergies.    Review of Systems   Review of Systems  Musculoskeletal:  Positive for back pain.    Physical Exam Updated Vital Signs BP 118/74 (BP Location: Right Arm)   Pulse 96   Temp 98.8 F (37.1 C) (Oral)   Resp 16   Ht 5\' 6"  (1.676 m)   Wt 83.9 kg   LMP 10/27/2013   SpO2 98%   BMI 29.86 kg/m  Physical Exam Vitals and nursing note reviewed.  Constitutional:      General: She is not in acute distress.    Appearance: She is well-developed. She is not ill-appearing.  Pulmonary:     Effort: Pulmonary effort is normal.  Musculoskeletal:        General: Normal range of motion.     Cervical back: Normal range of motion.  Skin:    General: Skin is warm and dry.  Neurological:     Mental Status: She is alert and oriented to person, place, and time.     ED Results / Procedures / Treatments   Labs (all labs ordered are listed,  but only abnormal results are displayed) Labs Reviewed - No data to display  EKG None  Radiology No results found.  Procedures Procedures    Medications Ordered in ED Medications  gadobutrol (GADAVIST) 1 MMOL/ML injection 8 mL (has no administration in time range)    ED Course/ Medical Decision Making/ A&P Clinical Course as of 06/24/23 1912  Wynelle Link Jun 24, 2023  1910 Patient with T6 metastatic CA, last radiation 10/24, feels increasing pain in the area and is concerned for recurrence. Declines pain medication. Just would like to be evaluated for bone lesion. MRI pending at time of sign out to Dr. Estell Harpin for MRI results.  [SU]    Clinical Course User Index [SU] Elpidio Anis, PA-C                                  Medical Decision Making Amount and/or Complexity of Data Reviewed Radiology: ordered.           Final Clinical Impression(s) / ED Diagnoses Final diagnoses:  Acute midline thoracic back pain    Rx / DC Orders ED Discharge Orders     None         Danne Harbor 06/24/23 1912    Bethann Berkshire, MD 06/26/23 1017

## 2023-06-24 NOTE — Discharge Instructions (Signed)
Follow-up with your doctor as needed.

## 2023-06-24 NOTE — ED Triage Notes (Signed)
Pt has had radiation for bone CA of the thoracic spine  Pt complains of pain in that area Increasing over the last couple of weeks Radiation completed in October  Pt denies numbness in legs and toes.

## 2023-07-03 ENCOUNTER — Telehealth: Payer: Self-pay | Admitting: Radiation Oncology

## 2023-07-03 NOTE — Telephone Encounter (Signed)
Pt called asking about scheduling appt with Dr. Kathrynn Running to discuss recent MRI done @ [facility] sent to PA Bruning for instructions.

## 2023-07-04 ENCOUNTER — Telehealth: Payer: Self-pay | Admitting: Radiation Therapy

## 2023-07-04 ENCOUNTER — Other Ambulatory Visit: Payer: BC Managed Care – PPO

## 2023-07-04 NOTE — Telephone Encounter (Signed)
I returned a call to [facility] in response to her question about upcoming imaging and future follow-up to assess persistent back pain.   She is scheduled for restaging chest abd and pelvis CT without IV contrast and concerned because the scheduler instructed her to come in two hours prior to the scan to drink contrast, which she had not done in the past. I spoke with one of the CT techs, Casimiro Needle, about this. He said that since she is being followed for renal cell cancer, there is no information lost by not drinking the oral contrast. Dana Hanson was very happy with this and plans to proceed with the study without oral contrast.   I also sent a message to Dr. Franky Macho and his medical secretary, Aram Beecham, requesting a visit to evaluate her persistent pain complaint. Dana Hanson said there is not a role for Kyphoplasty because of the compression of the vertebral body and she does not think Osteocool is an option due to her instrumentation. She is very interested to discuss other possible options including nerve block or ablation. She said that providers keep offering her pain medication for management but she is interested in something other that meds, something long lasting that does not keep her in a stupor.   The patients questions and concerns were answered. She has my contact information to reach back out if she does not hear from Dr. Sueanne Margarita office soon. She also has Cynthia's number to call for follow-up of appointment request.   Jalene Mullet R.T.(R)(T) Radiation Special Procedures Navigator

## 2023-07-07 ENCOUNTER — Ambulatory Visit (HOSPITAL_COMMUNITY)
Admission: RE | Admit: 2023-07-07 | Discharge: 2023-07-07 | Disposition: A | Discharge status: Home / Self Care | Payer: BC Managed Care – PPO | Source: Ambulatory Visit | Attending: Internal Medicine | Admitting: Internal Medicine

## 2023-07-07 DIAGNOSIS — C649 Malignant neoplasm of unspecified kidney, except renal pelvis: Secondary | ICD-10-CM | POA: Diagnosis present

## 2023-07-07 DIAGNOSIS — C7951 Secondary malignant neoplasm of bone: Secondary | ICD-10-CM | POA: Insufficient documentation

## 2023-07-10 ENCOUNTER — Ambulatory Visit: Payer: BC Managed Care – PPO | Admitting: Internal Medicine

## 2023-07-20 ENCOUNTER — Other Ambulatory Visit: Payer: Self-pay | Admitting: Medical Oncology

## 2023-07-25 ENCOUNTER — Inpatient Hospital Stay: Payer: BC Managed Care – PPO | Attending: Oncology

## 2023-07-25 DIAGNOSIS — C641 Malignant neoplasm of right kidney, except renal pelvis: Secondary | ICD-10-CM | POA: Insufficient documentation

## 2023-07-25 DIAGNOSIS — C7951 Secondary malignant neoplasm of bone: Secondary | ICD-10-CM | POA: Insufficient documentation

## 2023-07-25 DIAGNOSIS — C649 Malignant neoplasm of unspecified kidney, except renal pelvis: Secondary | ICD-10-CM

## 2023-07-25 DIAGNOSIS — Z79620 Long term (current) use of immunosuppressive biologic: Secondary | ICD-10-CM | POA: Insufficient documentation

## 2023-07-25 LAB — CBC WITH DIFFERENTIAL (CANCER CENTER ONLY)
Abs Immature Granulocytes: 0.01 10*3/uL (ref 0.00–0.07)
Basophils Absolute: 0 10*3/uL (ref 0.0–0.1)
Basophils Relative: 1 %
Eosinophils Absolute: 0.2 10*3/uL (ref 0.0–0.5)
Eosinophils Relative: 4 %
HCT: 38.4 % (ref 36.0–46.0)
Hemoglobin: 12.8 g/dL (ref 12.0–15.0)
Immature Granulocytes: 0 %
Lymphocytes Relative: 25 %
Lymphs Abs: 1 10*3/uL (ref 0.7–4.0)
MCH: 28.8 pg (ref 26.0–34.0)
MCHC: 33.3 g/dL (ref 30.0–36.0)
MCV: 86.3 fL (ref 80.0–100.0)
Monocytes Absolute: 0.4 10*3/uL (ref 0.1–1.0)
Monocytes Relative: 9 %
Neutro Abs: 2.3 10*3/uL (ref 1.7–7.7)
Neutrophils Relative %: 61 %
Platelet Count: 235 10*3/uL (ref 150–400)
RBC: 4.45 MIL/uL (ref 3.87–5.11)
RDW: 14.4 % (ref 11.5–15.5)
WBC Count: 3.9 10*3/uL — ABNORMAL LOW (ref 4.0–10.5)
nRBC: 0 % (ref 0.0–0.2)

## 2023-07-25 LAB — CMP (CANCER CENTER ONLY)
ALT: 5 U/L (ref 0–44)
AST: 15 U/L (ref 15–41)
Albumin: 4.3 g/dL (ref 3.5–5.0)
Alkaline Phosphatase: 61 U/L (ref 38–126)
Anion gap: 7 (ref 5–15)
BUN: 21 mg/dL (ref 8–23)
CO2: 28 mmol/L (ref 22–32)
Calcium: 9.7 mg/dL (ref 8.9–10.3)
Chloride: 107 mmol/L (ref 98–111)
Creatinine: 1.07 mg/dL — ABNORMAL HIGH (ref 0.44–1.00)
GFR, Estimated: 59 mL/min — ABNORMAL LOW (ref >60)
Glucose, Bld: 100 mg/dL — ABNORMAL HIGH (ref 70–99)
Potassium: 3.9 mmol/L (ref 3.5–5.1)
Sodium: 142 mmol/L (ref 135–145)
Total Bilirubin: 0.7 mg/dL (ref 0.0–1.2)
Total Protein: 7.3 g/dL (ref 6.5–8.1)

## 2023-07-25 LAB — LACTATE DEHYDROGENASE: LDH: 130 U/L (ref 98–192)

## 2023-07-25 LAB — TSH: TSH: 9.145 u[IU]/mL — ABNORMAL HIGH (ref 0.350–4.500)

## 2023-07-26 ENCOUNTER — Telehealth: Payer: Self-pay | Admitting: Internal Medicine

## 2023-08-01 ENCOUNTER — Ambulatory Visit: Payer: BC Managed Care – PPO | Admitting: Internal Medicine

## 2023-11-29 ENCOUNTER — Encounter (INDEPENDENT_AMBULATORY_CARE_PROVIDER_SITE_OTHER): Payer: 59 | Admitting: Ophthalmology

## 2024-01-20 ENCOUNTER — Ambulatory Visit (HOSPITAL_COMMUNITY)
Admission: RE | Admit: 2024-01-20 | Discharge: 2024-01-20 | Disposition: A | Discharge status: Home / Self Care | Payer: PRIVATE HEALTH INSURANCE | Source: Ambulatory Visit | Attending: Nurse Practitioner | Admitting: Nurse Practitioner

## 2024-01-20 ENCOUNTER — Ambulatory Visit
Admission: EM | Admit: 2024-01-20 | Discharge: 2024-01-20 | Disposition: A | Discharge status: Home / Self Care | Attending: Nurse Practitioner | Admitting: Nurse Practitioner

## 2024-01-20 DIAGNOSIS — M47816 Spondylosis without myelopathy or radiculopathy, lumbar region: Secondary | ICD-10-CM | POA: Diagnosis not present

## 2024-01-20 DIAGNOSIS — W19XXXA Unspecified fall, initial encounter: Secondary | ICD-10-CM | POA: Insufficient documentation

## 2024-01-20 DIAGNOSIS — M545 Low back pain, unspecified: Secondary | ICD-10-CM | POA: Diagnosis not present

## 2024-01-20 DIAGNOSIS — R0789 Other chest pain: Secondary | ICD-10-CM | POA: Insufficient documentation

## 2024-01-20 DIAGNOSIS — R937 Abnormal findings on diagnostic imaging of other parts of musculoskeletal system: Secondary | ICD-10-CM | POA: Insufficient documentation

## 2024-01-20 DIAGNOSIS — W1800XA Striking against unspecified object with subsequent fall, initial encounter: Secondary | ICD-10-CM | POA: Diagnosis not present

## 2024-01-20 NOTE — ED Provider Notes (Signed)
 [facility]    CSN: [id number] Arrival date & time: 01/20/24  1315      History   Chief Complaint No chief complaint on file.   HPI Dana Hanson is a 63 y.o. female.   The history is provided by the patient.   Patient presents for complaints of a 1 week history of right-sided low back pain and right posterior rib cage pain after she fell onto a piece of equipment at the gym.  Patient states pain worsens with movement, coughing, and deep breathing.  She states that she does have some swelling to the area as well.  Patient states that symptoms felt as if they were improving until she felt a pop in her right lower back over the past 24 hours.  Patient states that she has been taking muscle relaxers for her symptoms.    Past Medical History:  Diagnosis Date  . Arthritis    right knee  . Cancer (HCC)    kidney  . Gallstone pancreatitis   . Hyperlipidemia   . 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   . IBS (irritable bowel syndrome)    patient denies  . Right renal mass     Patient Active Problem List   Diagnosis Date Noted  . Pathologic thoracic fracture, sequela 10/11/2022  . Encounter for antineoplastic immunotherapy 08/24/2022  . Closed fracture of proximal end of left humerus with routine healing 01/25/2022  . History of colonic polyps 10/12/2021  . Abnormal PET scan of colon 10/12/2021  . Metastatic renal cell carcinoma to bone (HCC) 09/27/2021  . Cancer of right kidney (HCC) 09/03/2015  . Renal mass 09/03/2015  . Pancreatitis 08/21/2015  . Biliary obstruction   . Primary osteoarthritis of right knee 03/17/2014  . Hemorrhoids 08/01/2011  . Heme positive stool 08/01/2011  . Rectal bleed 08/01/2011    Past Surgical History:  Procedure Laterality Date  . CESAREAN SECTION     x 2-tubal with last c-section  . CHOLECYSTECTOMY N/A 09/03/2015   Procedure: LAPAROSCOPIC CHOLECYSTECTOMY WITH INTRAOPERATIVE CHOLANGIOGRAM;  Surgeon: Herlene Beverley Bureau, MD;   Location: [facility];  Service: General;  Laterality: N/A;  . COLONOSCOPY  08/14/2011   Surgeon: Lamar CHRISTELLA Hollingshead, MD; external hemorrhoid tags, likely source of hematochezia, normal rectum, multiple colonic polyps at the base of the cecum, all less than 5 mm, single 4 mm sigmoid polyp, scattered diverticula.  Pathology revealed tubular adenoma in the cecum, hyperplastic polyp in the sigmoid colon.  Recommended repeat colonoscopy in 3 years.  . COLONOSCOPY WITH PROPOFOL  N/A 11/30/2021   Procedure: COLONOSCOPY WITH PROPOFOL ;  Surgeon: Hollingshead Lamar CHRISTELLA, MD;  Location: [facility];  Service: Endoscopy;  Laterality: N/A;  1:45pm  . HEMORRHOID SURGERY  11/10/2011   Procedure: HEMORRHOIDECTOMY;  Surgeon: Thresa JAYSON Pulling, MD;  Location: [facility];  Service: General;  Laterality: N/A;  . IR RADIOLOGIST EVAL & MGMT  04/17/2023  . LAPAROSCOPIC GASTRIC SLEEVE RESECTION  01/15/2015  . LAPAROSCOPIC NEPHRECTOMY N/A 09/03/2015   Procedure: LAPAROSCOPIC RADICAL NEPHRECTOMY;  Surgeon: Morene LELON Salines, MD;  Location: [facility];  Service: Urology;  Laterality: N/A;  . LUMBAR PERCUTANEOUS PEDICLE SCREW 4 LEVEL N/A 10/11/2022   Procedure: OPEN REDUCTION INTERNAL FIXATION OF THORACIC SIX PATHOLOGIC FRACTURE;  Surgeon: Gillie Duncans, MD;  Location: [facility];  Service: Neurosurgery;  Laterality: N/A;  . POLYPECTOMY  11/30/2021   Procedure: POLYPECTOMY;  Surgeon: Hollingshead Lamar CHRISTELLA, MD;  Location: [facility];  Service: Endoscopy;;  . TUBAL LIGATION  with last c-section  . VERTEBROPLASTY N/A 09/09/2021   Procedure: Thoracic vertebral biopsy;  Surgeon: Gillie Duncans, MD;  Location: [facility];  Service: Neurosurgery;  Laterality: N/A;  RM 21    OB History   No obstetric history on file.      Home Medications    Prior to Admission medications   Medication Sig Start Date End Date Taking? Authorizing Provider  acetaminophen  (TYLENOL ) 500 MG tablet Take 500-1,000 mg by mouth every 6 (six) hours as needed (pain.).    [provider]  celecoxib  (CELEBREX ) 50 MG capsule Take 1 capsule (50 mg total) by mouth daily in the afternoon. 05/07/23   Pickenpack-Cousar, Athena N, NP  cyclobenzaprine  (FLEXERIL ) 5 MG tablet Take 1 tablet (5 mg total) by mouth 3 (three) times daily as needed for muscle spasms. 05/07/23   Pickenpack-Cousar, Athena N, NP  levothyroxine  (SYNTHROID ) 50 MCG tablet Take 1 tablet (50 mcg total) by mouth daily before breakfast. 05/02/23   Sherrod Sherrod, MD  Multiple Vitamin (MULITIVITAMIN WITH MINERALS) TABS Take 1 tablet by mouth in the morning.    [provider]  oxyCODONE -acetaminophen  (PERCOCET/ROXICET) 5-325 MG tablet Take 1 tablet by mouth every 6 (six) hours as needed for severe pain. 04/08/23   Cleotilde Rogue, MD  pantoprazole  (PROTONIX ) 20 MG tablet Take 1 tablet (20 mg total) by mouth daily. 06/11/23   Pickenpack-Cousar, Athena N, NP  pregabalin  (LYRICA ) 100 MG capsule Take 100 mg by mouth 3 (three) times daily. 03/28/23   [provider]  sucralfate  (CARAFATE ) 1 GM/10ML suspension Take 10 mLs (1 g total) by mouth 4 (four) times daily -  with meals and at bedtime. 05/14/23   Pickenpack-Cousar, Athena N, NP  traMADol  (ULTRAM ) 50 MG tablet Take 1 tablet (50 mg total) by mouth every 6 (six) hours as needed. 05/07/23   Pickenpack-Cousar, Athena N, NP  triamcinolone cream (KENALOG) 0.1 % Apply 1 Application topically daily as needed (skin rash/irritation.). 08/16/22   [provider]    Family History Family History  Adopted: Yes  Problem Relation Age of Onset  . Colon cancer Neg Hx   . Liver disease Neg Hx   . Anesthesia problems Neg Hx   . Hypotension Neg Hx   . Malignant hyperthermia Neg Hx   . Pseudochol deficiency Neg Hx     Social History Social History   Tobacco Use  . Smoking status: Never  . Smokeless tobacco: Never  Vaping Use  . Vaping status: Never Used  Substance Use Topics  . Alcohol use: No  . Drug use: No     Allergies   Patient has  no known allergies.   Review of Systems Review of Systems Per HPI  Physical Exam Triage Vital Signs ED Triage Vitals  Encounter Vitals Group     BP 01/20/24 1321 120/79     Girls Systolic BP Percentile --      Girls Diastolic BP Percentile --      Boys Systolic BP Percentile --      Boys Diastolic BP Percentile --      Pulse Rate 01/20/24 1321 84     Resp 01/20/24 1321 18     Temp 01/20/24 1321 (!) 97.5 F (36.4 C)     Temp Source 01/20/24 1321 Oral     SpO2 01/20/24 1321 95 %     Weight --      Height --      Head Circumference --  Peak Flow --      Pain Score 01/20/24 1317 9     Pain Loc --      Pain Education --      Exclude from Growth Chart --    No data found.  Updated Vital Signs BP 120/79 (BP Location: Right Arm)   Pulse 84   Temp (!) 97.5 F (36.4 C) (Oral)   Resp 18   LMP 10/27/2013   SpO2 95%   Visual Acuity Right Eye Distance:   Left Eye Distance:   Bilateral Distance:    Right Eye Near:   Left Eye Near:    Bilateral Near:     Physical Exam Vitals and nursing note reviewed.  Constitutional:      General: She is not in acute distress.    Appearance: Normal appearance.  HENT:     Head: Normocephalic.  Eyes:     Extraocular Movements: Extraocular movements intact.     Pupils: Pupils are equal, round, and reactive to light.  Cardiovascular:     Rate and Rhythm: Normal rate and regular rhythm.     Pulses: Normal pulses.     Heart sounds: Normal heart sounds.  Pulmonary:     Effort: Pulmonary effort is normal.     Breath sounds: Normal breath sounds.  Abdominal:     General: Bowel sounds are normal.     Palpations: Abdomen is soft.     Tenderness: There is no abdominal tenderness.  Musculoskeletal:     Cervical back: Normal range of motion.     Lumbar back: Swelling (localized swelling to the right lower paraspinal region) and tenderness present. No deformity. Decreased range of motion. Negative right straight leg raise test and  negative left straight leg raise test.       Back:  Skin:    General: Skin is warm and dry.  Neurological:     General: No focal deficit present.     Mental Status: She is alert and oriented to person, place, and time.  Psychiatric:        Mood and Affect: Mood normal.        Behavior: Behavior normal.      UC Treatments / Results  Labs (all labs ordered are listed, but only abnormal results are displayed) Labs Reviewed - No data to display  EKG   Radiology No results found.  Procedures Procedures (including critical care time)  Medications Ordered in UC Medications - No data to display  Initial Impression / Assessment and Plan / UC Course  I have reviewed the triage vital signs and the nursing notes.  Pertinent labs & imaging results that were available during my care of the patient were reviewed by me and considered in my medical decision making (see chart for details).  X-ray of the lumbar spine and right rib cage are pending.  At a minimum, patient with a contusion to the soft tissue in the right back.  She does not have any vertebral tenderness, pain, or swelling.  Supportive care recommendations were provided and discussed with the patient to include continuing over-the-counter muscle relaxers for pain, use of ice or heat, and coughing and deep breathing to prevent the development of pneumonia.  Patient was given strict ER follow-up precautions, along with indications to follow-up with her PCP.SABRA  Patient was in agreement with this plan of care and verbalizes understanding.  All questions were answered.  Patient stable for discharge.   Final Clinical Impressions(s) / UC Diagnoses  Final diagnoses:  None   Discharge Instructions   None    ED Prescriptions   None    PDMP not reviewed this encounter.   Gilmer Etta PARAS, NP 01/20/24 1555

## 2024-01-20 NOTE — ED Triage Notes (Signed)
 Pt reports she fell and hurt her mid to low back x 1 week

## 2024-01-20 NOTE — Discharge Instructions (Addendum)
 You will need to go to [facility] for an x-ray of your low back and of your rib cage.  You will need to go to the emergency department for registration.  Please let them know you are there only for an x-ray.  You will be contacted when the results of the x-ray are received.  You will also have access to your results via MyChart. Continue the current muscle relaxers you are taking. You may take over-the-counter Tylenol  as needed for pain or discomfort. Try to remain as active as possible. Gentle range of motion and stretching exercises to help with back pain. May apply ice or heat as needed.  Ice is recommended for pain or swelling, heat for spasm or stiffness.  Apply for 20 minutes, remove for 1 hour, then repeat. Go to the emergency department immediately if you develop weakness in your legs or feet, inability to walk, loss of bowel or bladder function, difficulty urinating or passing a bowel movement, or other concerns. If your x-ray is negative and symptoms fail to improve over the next 2 to 3 weeks, recommend following up with your primary care physician for further evaluation. Follow-up as needed.

## 2024-01-22 ENCOUNTER — Ambulatory Visit (HOSPITAL_COMMUNITY): Payer: Self-pay

## 2024-01-22 ENCOUNTER — Telehealth: Payer: Self-pay | Admitting: Nurse Practitioner

## 2024-01-22 NOTE — Telephone Encounter (Signed)
 Late Entry: Called patient on 01/20/24 to discuss x-ray results of the lumbar spine and right rib cage.  Spoke with patient, verified 2 patient identifiers.  Patient was advised that the x-ray of her lumbar spine showed degenerative changes, but the x-ray of her right rib cage showed acute nondisplaced right 9th and 10th ribs.  Patient was advised to continue current treatment recommendations at this time.  Patient was also given strict ER follow-up precautions.  Patient was in agreement with this plan of care and verbalized understanding.  All questions were answered.

## 2024-02-06 NOTE — Therapy (Signed)
 OUTPATIENT PHYSICAL THERAPY THORACOLUMBAR EVALUATION   Patient Name: Dana Hanson MRN: [id number] DOB:1961/04/11, 63 y.o., female Today's Date: 02/07/2024  END OF SESSION:  PT End of Session - 02/07/24 1149     Visit Number 1    Number of Visits 6    Date for PT Re-Evaluation 03/20/24    Authorization Type [employer]    PT Start Time 1150    PT Stop Time 1230    PT Time Calculation (min) 40 min    Activity Tolerance Patient tolerated treatment well    Behavior During Therapy WFL for tasks assessed/performed          Past Medical History:  Diagnosis Date   Arthritis    right knee   Cancer (HCC)    kidney   Gallstone pancreatitis    Hyperlipidemia    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    IBS (irritable bowel syndrome)    patient denies   Right renal mass    Past Surgical History:  Procedure Laterality Date   CESAREAN SECTION     x 2-tubal with last c-section   CHOLECYSTECTOMY N/A 09/03/2015   Procedure: LAPAROSCOPIC CHOLECYSTECTOMY WITH INTRAOPERATIVE CHOLANGIOGRAM;  Surgeon: Herlene Beverley Bureau, MD;  Location: [facility];  Service: General;  Laterality: N/A;   COLONOSCOPY  08/14/2011   Surgeon: Lamar CHRISTELLA Hollingshead, MD; external hemorrhoid tags, likely source of hematochezia, normal rectum, multiple colonic polyps at the base of the cecum, all less than 5 mm, single 4 mm sigmoid polyp, scattered diverticula.  Pathology revealed tubular adenoma in the cecum, hyperplastic polyp in the sigmoid colon.  Recommended repeat colonoscopy in 3 years.   COLONOSCOPY WITH PROPOFOL  N/A 11/30/2021   Procedure: COLONOSCOPY WITH PROPOFOL ;  Surgeon: Hollingshead Lamar CHRISTELLA, MD;  Location: [facility];  Service: Endoscopy;  Laterality: N/A;  1:45pm   HEMORRHOID SURGERY  11/10/2011   Procedure: HEMORRHOIDECTOMY;  Surgeon: Thresa JAYSON Pulling, MD;  Location: [facility];  Service: General;  Laterality: N/A;   IR RADIOLOGIST EVAL & MGMT  04/17/2023   LAPAROSCOPIC GASTRIC SLEEVE RESECTION  01/15/2015   LAPAROSCOPIC  NEPHRECTOMY N/A 09/03/2015   Procedure: LAPAROSCOPIC RADICAL NEPHRECTOMY;  Surgeon: Morene LELON Salines, MD;  Location: [facility];  Service: Urology;  Laterality: N/A;   LUMBAR PERCUTANEOUS PEDICLE SCREW 4 LEVEL N/A 10/11/2022   Procedure: OPEN REDUCTION INTERNAL FIXATION OF THORACIC SIX PATHOLOGIC FRACTURE;  Surgeon: Gillie Duncans, MD;  Location: [facility];  Service: Neurosurgery;  Laterality: N/A;   POLYPECTOMY  11/30/2021   Procedure: POLYPECTOMY;  Surgeon: Hollingshead Lamar CHRISTELLA, MD;  Location: [facility];  Service: Endoscopy;;   TUBAL LIGATION     with last c-section   VERTEBROPLASTY N/A 09/09/2021   Procedure: Thoracic vertebral biopsy;  Surgeon: Gillie Duncans, MD;  Location: [facility];  Service: Neurosurgery;  Laterality: N/A;  RM 21   Patient Active Problem List   Diagnosis Date Noted   Pathologic thoracic fracture, sequela 10/11/2022   Encounter for antineoplastic immunotherapy 08/24/2022   Closed fracture of proximal end of left humerus with routine healing 01/25/2022   History of colonic polyps 10/12/2021   Abnormal PET scan of colon 10/12/2021   Metastatic renal cell carcinoma to bone (HCC) 09/27/2021   Cancer of right kidney (HCC) 09/03/2015   Renal mass 09/03/2015   Pancreatitis 08/21/2015   Biliary obstruction    Primary osteoarthritis of right knee 03/17/2014   Hemorrhoids 08/01/2011   Heme positive stool 08/01/2011   Rectal bleed 08/01/2011    PCP:  Sheryle Carwin  REFERRING PROVIDER: Candida Morene ORN, MD  REFERRING DIAG: cancer to spine, dry needling  Rationale for Evaluation and Treatment: Rehabilitation  THERAPY DIAG:  Other low back pain  Chronic thoracic back pain, unspecified back pain laterality  ONSET DATE:  10/11/22 thoracic spine fx s/p  SUBJECTIVE:                                                                                                                                                                                           SUBJECTIVE STATEMENT: Back pain  started and then diagnosed with cancer in 2017; removed kidney and gallbladder then later recurrent mass T6 oligometastatic.  Did chemo and radiation.  Did well but then had pain again 10/11/23; thought was a cancer recurrance but was not had a fracture of T6; had screws put in from T4 to T8 per Dr. Gillie finished radiation 04/2023.  Latest scans in march and June clear.  Dr. Candida referred for dry needling.  Has had it before for her left shoulder.  She recently had a fall 4 weeks ago fx 2 ribs on the right fell in the gym off the [employer] ball.    PERTINENT HISTORY:  Right knee OA  PAIN:  Are you having pain? Yes: NPRS scale: 6/10 back and ribs Pain location: back and ribs more to the right side Pain description: spasms Aggravating factors: unknown Relieving factors: ibuprofen  PRECAUTIONS: None  RED FLAGS: Spinal tumors: Yes: history of tumors but clear now and Compression fracture: Yes: surgery for T6 compression fx 10/11/22   WEIGHT BEARING RESTRICTIONS: No  FALLS:  Has patient fallen in last 6 months? Yes. 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 Candida October  OBJECTIVE:  Note: Objective measures were completed at Evaluation unless otherwise noted.  DIAGNOSTIC FINDINGS:  Clear per patient  PATIENT SURVEYS:  Modified Oswestry: 18/50 36%  Interpretation of scores: Score Category Description  0-20% Minimal Disability The patient can cope with most living activities. Usually no treatment is indicated apart from advice on lifting, sitting and exercise  21-40% Moderate Disability The patient experiences more pain and difficulty with sitting, lifting and standing. Travel and social life are more difficult and they may be disabled from work. Personal care, sexual activity and sleeping are not grossly affected, and the patient can usually be managed by conservative means  41-60% Severe  Disability Pain remains the main problem in this group, but activities of daily living are affected. These patients require a detailed investigation  61-80%  Crippled Back pain impinges on all aspects of the patient's life. Positive intervention is required  81-100% Bed-bound  These patients are either bed-bound or exaggerating their symptoms  Bluford FORBES Zoe DELENA Karon DELENA, et al. Surgery versus conservative management of stable thoracolumbar fracture: the PRESTO feasibility RCT. [place] ([place]): [employer]; 2021 Nov. [place] [facility] [employer] Assessment, No. 25.62.) Appendix 3, Oswestry Disability Index category descriptors. Available from: FindJewel[employer].cz  Minimally Clinically Important Difference (MCID) = 12.8%  COGNITION: Overall cognitive status: Within functional limits for tasks assessed     SENSATION: WFL   POSTURE: rounded shoulders and forward head  PALPATION: Tenderness noted right side thoracic spine; upper trap, rhomboids  LUMBAR ROM:   AROM eval  Flexion Fingertips to ankles  Extension 50% available  Right lateral flexion Right fingertips to 1 below knee joint line  Left lateral flexion Left fingertips to 1 above knee joint line  Right rotation   Left rotation    (Blank rows = not tested)  LOWER EXTREMITY ROM:     Active  Right eval Left eval  Hip flexion 4+ 4+  Hip extension    Hip abduction    Hip adduction    Hip internal rotation    Hip external rotation    Knee flexion    Knee extension 5 5  Ankle dorsiflexion 5 5  Ankle plantarflexion    Ankle inversion    Ankle eversion    Shoulder flexion 4-* 4+   (Blank rows = not tested)  LOWER EXTREMITY MMT:    MMT Right eval Left eval  Hip flexion    Hip extension    Hip abduction    Hip adduction    Hip internal rotation    Hip external rotation    Knee flexion    Knee extension    Ankle dorsiflexion    Ankle plantarflexion    Ankle inversion    Ankle  eversion     (Blank rows = not tested)   FUNCTIONAL TESTS:  5 times sit to stand: 29.82 sec using hands to assist  GAIT: Distance walked: 50 ft in clinic Assistive device utilized: None Level of assistance: Modified independence Comments: slight decreased gait speed  TREATMENT DATE: 02/07/23 physical therapy evaluation and dry needling Trigger Point Dry Needling  Initial Treatment: Pt instructed on Dry Needling rational, procedures, and possible side effects. Pt instructed to expect mild to moderate muscle soreness later in the day and/or into the next day.  Pt instructed in methods to reduce muscle soreness. Pt instructed to continue prescribed HEP. Because Dry Needling was performed over or adjacent to a lung field, pt was educated on S/S of pneumothorax and to seek immediate medical attention should they occur.  Patient was educated on signs and symptoms of infection and other risk factors and advised to seek medical attention should they occur.  Patient verbalized understanding of these instructions and education.   Patient Verbal Consent Given: Yes Education Handout Provided: Yes Muscles Treated: right side rhomboids, upper trap Electrical Stimulation Performed: No Treatment Response/Outcome: deep ache; mild nausea  PATIENT EDUCATION:  Education details: Patient educated on exam findings, POC, scope of PT, HEP, and dry needling instructions. Person educated: Patient Education method: Explanation, Demonstration, and Handouts Education comprehension: verbalized understanding, returned demonstration, verbal cues required, and tactile cues required  HOME EXERCISE PROGRAM: Scapular retractions, upper trap stretch Access Code: [email or web address] URL: [email or web address] Date: 02/15/24 Prepared by: [facility]  Exercises - Seated Scapular  Retraction  - 2 x daily - 7 x weekly - 1 sets - 10 reps - 5 sec hold - Seated Upper Trapezius Stretch  - 2 x daily - 7 x weekly - 1 sets - 5 reps - 20 sec hold ASSESSMENT:  CLINICAL IMPRESSION: Patient is a 63 y.o. female who was seen today for physical therapy evaluation and treatment for back pain secondary to cancer to spine, dry needling.  Patient demonstrates muscle weakness, reduced ROM, and fascial restrictions which are likely contributing to symptoms of pain and are negatively impacting patient ability to perform ADLs and functional mobility tasks. Patient will benefit from skilled physical therapy services to address these deficits to reduce pain and improve level of function with ADLs and functional mobility tasks.   OBJECTIVE IMPAIRMENTS: Abnormal gait, decreased activity tolerance, decreased ROM, increased fascial restrictions, impaired perceived functional ability, and pain.   ACTIVITY LIMITATIONS: carrying, lifting, bending, sitting, standing, squatting, sleeping, toileting, dressing, reach over head, hygiene/grooming, and locomotion level  PARTICIPATION LIMITATIONS: meal prep, cleaning, laundry, shopping, community activity, occupation, and church  REHAB POTENTIAL: Good  CLINICAL DECISION MAKING: Evolving/moderate complexity  EVALUATION COMPLEXITY: Moderate   GOALS: Goals reviewed with patient? No  SHORT TERM GOALS: Target date: 02/28/2024  patient will be independent with initial HEP  Baseline: Goal status: INITIAL  2.  Patient will report 50% improvement overall  Baseline:  Goal status: INITIAL   LONG TERM GOALS: Target date: 03/20/2024  Patient will be independent in self management strategies to improve quality of life and functional outcomes.  Baseline:  Goal status: INITIAL  2.  Patient will report 70% improvement overall  Baseline:  Goal status: INITIAL  3.  Patient will improve Modified Oswestry score by 7 points to demonstrate improved  perceived function  Baseline: 18/50 Goal status: INITIAL  4.  Patient will improve 5 times sit to stand score to 20 sec or less to demonstrate improved functional mobility and increased leg strength.    Baseline: 29.82 sec Goal status: INITIAL  PLAN:  PT FREQUENCY: 1x/week  PT DURATION: 6 weeks  PLANNED INTERVENTIONS: 97164- PT Re-evaluation, 97110-Therapeutic exercises, 97530- Therapeutic activity, [id number]- Neuromuscular re-education, 97535- Self Care, [id number]- Manual therapy, [id number]- Gait training, [phone]- Orthotic Fit/training, [phone]- Canalith repositioning, [id number]- Aquatic Therapy, [phone]- Splinting, [phone]- Wound care (first 20 sq cm), 97598- Wound care (each additional 20 sq cm)Patient/Family education, Balance training, Stair training, Taping, Dry Needling, Joint mobilization, Joint manipulation, Spinal manipulation, Spinal mobilization, Scar mobilization, and DME instructions. SABRA  PLAN FOR NEXT SESSION: review goals; print out HEP; assess reaction to dry needling; postural strengthening and spinal mobility   1:23 PM, 2024-02-15 Yitzchok Carriger Small Albino Bufford MPT [facility] [place] [id number] Ph:[phone]  [employer] Authorization Request Treatment Start Date: 2024/02/15  Visit Dx Codes: M54.59, M54.6  Functional Tool Score: modified Oswestry 18/50 36%  For all possible CPT codes, reference the Planned Interventions line above.     Check all conditions that are expected to impact treatment: {Conditions expected to impact treatment:None of these apply

## 2024-02-07 ENCOUNTER — Ambulatory Visit (HOSPITAL_COMMUNITY): Payer: PRIVATE HEALTH INSURANCE | Attending: Unknown Physician Specialty

## 2024-02-07 ENCOUNTER — Other Ambulatory Visit: Payer: Self-pay

## 2024-02-07 DIAGNOSIS — M546 Pain in thoracic spine: Secondary | ICD-10-CM | POA: Diagnosis present

## 2024-02-07 DIAGNOSIS — M5459 Other low back pain: Secondary | ICD-10-CM | POA: Insufficient documentation

## 2024-02-07 DIAGNOSIS — G8929 Other chronic pain: Secondary | ICD-10-CM | POA: Insufficient documentation

## 2024-02-07 NOTE — Patient Instructions (Signed)

## 2024-02-14 ENCOUNTER — Ambulatory Visit (HOSPITAL_COMMUNITY): Payer: PRIVATE HEALTH INSURANCE

## 2024-02-14 DIAGNOSIS — M5459 Other low back pain: Secondary | ICD-10-CM | POA: Diagnosis not present

## 2024-02-14 DIAGNOSIS — G8929 Other chronic pain: Secondary | ICD-10-CM

## 2024-02-14 NOTE — Therapy (Signed)
 OUTPATIENT PHYSICAL THERAPY THORACOLUMBAR TREATMENT   Patient Name: Dana Hanson MRN: [id number] DOB:[birth year], 63 y.o., female Today's Date: 02/14/2024  END OF SESSION:  PT End of Session - 02/14/24 1017     Visit Number 2    Number of Visits 6    Date for PT Re-Evaluation 03/20/24    Authorization Type BCBS    PT Start Time 1017    PT Stop Time 1057    PT Time Calculation (min) 40 min    Activity Tolerance Patient tolerated treatment well    Behavior During Therapy WFL for tasks assessed/performed          Past Medical History:  Diagnosis Date   Arthritis    right knee   Cancer (HCC)    kidney   Gallstone pancreatitis    Hyperlipidemia    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    IBS (irritable bowel syndrome)    patient denies   Right renal mass    Past Surgical History:  Procedure Laterality Date   CESAREAN SECTION     x 2-tubal with last c-section   CHOLECYSTECTOMY N/A 09/03/2015   Procedure: LAPAROSCOPIC CHOLECYSTECTOMY WITH INTRAOPERATIVE CHOLANGIOGRAM;  Surgeon: Herlene Beverley Bureau, MD;  Location: [facility];  Service: General;  Laterality: N/A;   COLONOSCOPY  08/14/2011   Surgeon: Lamar CHRISTELLA Hollingshead, MD; external hemorrhoid tags, likely source of hematochezia, normal rectum, multiple colonic polyps at the base of the cecum, all less than 5 mm, single 4 mm sigmoid polyp, scattered diverticula.  Pathology revealed tubular adenoma in the cecum, hyperplastic polyp in the sigmoid colon.  Recommended repeat colonoscopy in 3 years.   COLONOSCOPY WITH PROPOFOL  N/A 11/30/2021   Procedure: COLONOSCOPY WITH PROPOFOL ;  Surgeon: Hollingshead Lamar CHRISTELLA, MD;  Location: [facility];  Service: Endoscopy;  Laterality: N/A;  1:45pm   HEMORRHOID SURGERY  11/10/2011   Procedure: HEMORRHOIDECTOMY;  Surgeon: Thresa JAYSON Pulling, MD;  Location: [facility];  Service: General;  Laterality: N/A;   IR RADIOLOGIST EVAL & MGMT  04/17/2023   LAPAROSCOPIC GASTRIC SLEEVE RESECTION  01/15/2015   LAPAROSCOPIC  NEPHRECTOMY N/A 09/03/2015   Procedure: LAPAROSCOPIC RADICAL NEPHRECTOMY;  Surgeon: Morene LELON Salines, MD;  Location: [facility];  Service: Urology;  Laterality: N/A;   LUMBAR PERCUTANEOUS PEDICLE SCREW 4 LEVEL N/A 10/11/2022   Procedure: OPEN REDUCTION INTERNAL FIXATION OF THORACIC SIX PATHOLOGIC FRACTURE;  Surgeon: Gillie Duncans, MD;  Location: [facility];  Service: Neurosurgery;  Laterality: N/A;   POLYPECTOMY  11/30/2021   Procedure: POLYPECTOMY;  Surgeon: Hollingshead Lamar CHRISTELLA, MD;  Location: [facility];  Service: Endoscopy;;   TUBAL LIGATION     with last c-section   VERTEBROPLASTY N/A 09/09/2021   Procedure: Thoracic vertebral biopsy;  Surgeon: Gillie Duncans, MD;  Location: [facility] OR;  Service: Neurosurgery;  Laterality: N/A;  RM 21   Patient Active Problem List   Diagnosis Date Noted   Pathologic thoracic fracture, sequela 10/11/2022   Encounter for antineoplastic immunotherapy 08/24/2022   Closed fracture of proximal end of left humerus with routine healing 01/25/2022   History of colonic polyps 10/12/2021   Abnormal PET scan of colon 10/12/2021   Metastatic renal cell carcinoma to bone (HCC) 09/27/2021   Cancer of right kidney (HCC) 09/03/2015   Renal mass 09/03/2015   Pancreatitis 08/21/2015   Biliary obstruction    Primary osteoarthritis of right knee 03/17/2014   Hemorrhoids 08/01/2011   Heme positive stool 08/01/2011   Rectal bleed 08/01/2011    PCP:  Sheryle Carwin  REFERRING PROVIDER: Candida Morene ORN, MD  REFERRING DIAG: cancer to spine, dry needling  Rationale for Evaluation and Treatment: Rehabilitation  THERAPY DIAG:  Other low back pain  Chronic thoracic back pain, unspecified back pain laterality  ONSET DATE:  10/11/22 thoracic spine fx s/p  SUBJECTIVE:                                                                                                                                                                                           SUBJECTIVE STATEMENT: Aching today  just generally from arthritis? States she is still going to the gym; hip is sore from hip abduction exercise yesterday.  EVAL: Back pain started and then diagnosed with cancer in 2017; removed kidney and gallbladder then later recurrent mass T6 oligometastatic.  Did chemo and radiation.  Did well but then had pain again 10/11/23; thought was a cancer recurrance but was not had a fracture of T6; had screws put in from T4 to T8 per Dr. Gillie finished radiation 04/2023.  Latest scans in march and June clear.  Dr. Candida referred for dry needling.  Has had it before for her left shoulder.  She recently had a fall 4 weeks ago fx 2 ribs on the right fell in the gym off the [employer] ball.    PERTINENT HISTORY:  Right knee OA  PAIN:  Are you having pain? Yes: NPRS scale: 6/10 back and ribs Pain location: back and ribs more to the right side Pain description: spasms Aggravating factors: unknown Relieving factors: ibuprofen  PRECAUTIONS: None  RED FLAGS: Spinal tumors: Yes: history of tumors but clear now and Compression fracture: Yes: surgery for T6 compression fx 10/11/22   WEIGHT BEARING RESTRICTIONS: No  FALLS:  Has patient fallen in last 6 months? Yes. 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 Candida October  OBJECTIVE:  Note: Objective measures were completed at Evaluation unless otherwise noted.  DIAGNOSTIC FINDINGS:  Clear per patient  PATIENT SURVEYS:  Modified Oswestry: 18/50 36%  Interpretation of scores: Score Category Description  0-20% Minimal Disability The patient can cope with most living activities. Usually no treatment is indicated apart from advice on lifting, sitting and exercise  21-40% Moderate Disability The patient experiences more pain and difficulty with sitting, lifting and standing. Travel and social life are more difficult and they may be disabled from work.  Personal care, sexual activity and sleeping are not grossly affected, and the patient can usually be managed by conservative means  41-60% Severe  Disability Pain remains the main problem in this group, but activities of daily living are affected. These patients require a detailed investigation  61-80% Crippled Back pain impinges on all aspects of the patient's life. Positive intervention is required  81-100% Bed-bound  These patients are either bed-bound or exaggerating their symptoms  Bluford FORBES Zoe DELENA Karon DELENA, et al. Surgery versus conservative management of stable thoracolumbar fracture: the PRESTO feasibility RCT. [place] ([place]): [employer]; 2021 Nov. [facility] Technology Assessment, No. 25.62.) Appendix 3, Oswestry Disability Index category descriptors. Available from: FindJewelers.cz  Minimally Clinically Important Difference (MCID) = 12.8%  COGNITION: Overall cognitive status: Within functional limits for tasks assessed     SENSATION: WFL   POSTURE: rounded shoulders and forward head  PALPATION: Tenderness noted right side thoracic spine; upper trap, rhomboids  LUMBAR ROM:   AROM eval  Flexion Fingertips to ankles  Extension 50% available  Right lateral flexion Right fingertips to 1 below knee joint line  Left lateral flexion Left fingertips to 1 above knee joint line  Right rotation   Left rotation    (Blank rows = not tested)  LOWER EXTREMITY ROM:     Active  Right eval Left eval  Hip flexion 4+ 4+  Hip extension    Hip abduction    Hip adduction    Hip internal rotation    Hip external rotation    Knee flexion    Knee extension 5 5  Ankle dorsiflexion 5 5  Ankle plantarflexion    Ankle inversion    Ankle eversion    Shoulder flexion 4-* 4+   (Blank rows = not tested)  LOWER EXTREMITY MMT:    MMT Right eval Left eval  Hip flexion    Hip extension    Hip abduction    Hip adduction    Hip internal  rotation    Hip external rotation    Knee flexion    Knee extension    Ankle dorsiflexion    Ankle plantarflexion    Ankle inversion    Ankle eversion     (Blank rows = not tested)   FUNCTIONAL TESTS:  5 times sit to stand: 29.82 sec using hands to assist  GAIT: Distance walked: 50 ft in clinic Assistive device utilized: None Level of assistance: Modified independence Comments: slight decreased gait speed  TREATMENT DATE:  02/14/24 Review of goals and HEP Seated  Upper trap stretch 5 x 20 Levator stretch 5 x 20 Scapular retractions 5 x 10 Rhomboid stretch 5 x 10 Supine moist heat to right rhomboid x 5' to decrease pain and improve soft tissue extensibility Trigger Point Dry Needling  Subsequent Treatment: Instructions provided previously at initial dry needling treatment.   Patient Verbal Consent Given: Yes Education Handout Provided: Previously Provided Muscles Treated: right  rhomboids Electrical Stimulation Performed: No Treatment Response/Outcome: decreased muscle tightness and pain       02/07/23 physical therapy evaluation and dry needling Trigger Point Dry Needling  Initial Treatment: Pt instructed on Dry Needling rational, procedures, and possible side effects. Pt instructed to expect mild to moderate muscle soreness later in the day and/or into the next day.  Pt instructed in methods to reduce muscle soreness. Pt instructed to continue prescribed HEP. Because Dry Needling was performed over or adjacent to a lung field, pt was educated on S/S of pneumothorax and to seek immediate medical attention should they occur.  Patient was educated on signs and symptoms of infection and other risk factors  and advised to seek medical attention should they occur.  Patient verbalized understanding of these instructions and education.   Patient Verbal Consent Given: Yes Education Handout Provided: Yes Muscles Treated: right side rhomboids, upper trap Electrical  Stimulation Performed: No Treatment Response/Outcome: deep ache; mild nausea                                                                                                                                   PATIENT EDUCATION:  Education details: Patient educated on exam findings, POC, scope of PT, HEP, and dry needling instructions. Person educated: Patient Education method: Explanation, Demonstration, and Handouts Education comprehension: verbalized understanding, returned demonstration, verbal cues required, and tactile cues required  HOME EXERCISE PROGRAM: [email or web address] Access Code: [email or web address] URL: [email or web address] Date: 02/14/2024 Prepared by: [facility]  Exercises - Seated Scapular Retraction  - 2 x daily - 7 x weekly - 1 sets - 10 reps - 5 sec hold - Seated Upper Trapezius Stretch  - 2 x daily - 7 x weekly - 1 sets - 5 reps - 20 sec hold - Seated Levator Scapulae Stretch  - 2 x daily - 7 x weekly - 1 sets - 5 reps - 20 sec hold - Doorway Rhomboid Stretch  - 2 x daily - 7 x weekly - 1 sets - 5 reps - 20 sec hold  ASSESSMENT:  CLINICAL IMPRESSION: Today's visit started with review of goals and HEP; patient verbalizes agreement with set rehab goals.  Added rhomboid and levator stretch to HEP.  Continued with dry needling with patient reporting decreased pain and palpable decreased muscle tightness after treatment.  Patient will benefit from continued skilled therapy services to address deficits and promote return to optimal function.      Eval: Patient is a 63 y.o. female who was seen today for physical therapy evaluation and treatment for back pain secondary to cancer to spine, dry needling.  Patient demonstrates muscle weakness, reduced ROM, and fascial restrictions which are likely contributing to symptoms of pain and are negatively impacting patient ability to perform ADLs and functional mobility tasks. Patient will benefit from skilled physical therapy services to  address these deficits to reduce pain and improve level of function with ADLs and functional mobility tasks.   OBJECTIVE IMPAIRMENTS: Abnormal gait, decreased activity tolerance, decreased ROM, increased fascial restrictions, impaired perceived functional ability, and pain.   ACTIVITY LIMITATIONS: carrying, lifting, bending, sitting, standing, squatting, sleeping, toileting, dressing, reach over head, hygiene/grooming, and locomotion level  PARTICIPATION LIMITATIONS: meal prep, cleaning, laundry, shopping, community activity, occupation, and church  REHAB POTENTIAL: Good  CLINICAL DECISION MAKING: Evolving/moderate complexity  EVALUATION COMPLEXITY: Moderate   GOALS: Goals reviewed with patient? No  SHORT TERM GOALS: Target date: 02/28/2024  patient will be independent with initial HEP  Baseline: Goal status: in progress  2.  Patient will report 50% improvement overall  Baseline:  Goal status: in progress   LONG  TERM GOALS: Target date: 03/20/2024  Patient will be independent in self management strategies to improve quality of life and functional outcomes.  Baseline:  Goal status: in progress  2.  Patient will report 70% improvement overall  Baseline:  Goal status: in progress  3.  Patient will improve Modified Oswestry score by 7 points to demonstrate improved perceived function  Baseline: 18/50 Goal status: in progress   4.  Patient will improve 5 times sit to stand score to 20 sec or less to demonstrate improved functional mobility and increased leg strength.    Baseline: 29.82 sec Goal status: in progress  PLAN:  PT FREQUENCY: 1x/week  PT DURATION: 6 weeks  PLANNED INTERVENTIONS: 97164- PT Re-evaluation, 97110-Therapeutic exercises, 97530- Therapeutic activity, 97112- Neuromuscular re-education, 97535- Self Care, 02859- Manual therapy, [id number]- Gait training, [phone]- Orthotic Fit/training, [phone]- Canalith repositioning, [id number]- Aquatic Therapy, [phone]-  Splinting, [phone]- Wound care (first 20 sq cm), 97598- Wound care (each additional 20 sq cm)Patient/Family education, Balance training, Stair training, Taping, Dry Needling, Joint mobilization, Joint manipulation, Spinal manipulation, Spinal mobilization, Scar mobilization, and DME instructions. SABRA  PLAN FOR NEXT SESSION:   assess reaction to dry needling; postural strengthening and spinal mobility; added theraband postural exercises   11:05 AM, 02/14/24 Daysean Tinkham Small Natalee Tomkiewicz MPT [place] [facility] [place] [phone] Ph:[phone]  [employer] Authorization Request Treatment Start Date: Mar 07, 2024  Visit Dx Codes: M54.59, M54.6  Functional Tool Score: modified Oswestry 18/50 36%  For all possible CPT codes, reference the Planned Interventions line above.     Check all conditions that are expected to impact treatment: {Conditions expected to impact treatment:None of these apply

## 2024-02-19 ENCOUNTER — Encounter (HOSPITAL_COMMUNITY): Payer: Self-pay

## 2024-02-19 NOTE — Therapy (Signed)
 [facility] at [facility] [address] [place], [place], [address] Phone: [phone]   Fax:  [phone]  Patient Details  Name: Dana Hanson MRN: [id number] Date of Birth: 10-Dec-1960 Referring Provider:  No ref. provider found PHYSICAL THERAPY DISCHARGE SUMMARY  Visits from Start of Care: 2  Current functional level related to goals / functional outcomes: unknown   Remaining deficits: unknown   Education / Equipment: HEP   Patient agrees to discharge. Patient goals were not met. Patient is being discharged due to the patient's request.    [facility] at [facility] [address] [place], [place], [address] Phone: [phone]   Fax:  [phone]

## 2024-02-21 ENCOUNTER — Encounter (HOSPITAL_COMMUNITY): Payer: PRIVATE HEALTH INSURANCE

## 2024-02-25 ENCOUNTER — Ambulatory Visit: Payer: PRIVATE HEALTH INSURANCE | Admitting: Orthopedic Surgery

## 2024-02-27 ENCOUNTER — Ambulatory Visit: Payer: PRIVATE HEALTH INSURANCE | Admitting: Orthopedic Surgery

## 2024-02-27 ENCOUNTER — Encounter: Payer: Self-pay | Admitting: Orthopedic Surgery

## 2024-02-27 ENCOUNTER — Telehealth: Payer: Self-pay | Admitting: Orthopedic Surgery

## 2024-02-27 VITALS — BP 120/79 | Ht 66.0 in | Wt 185.0 lb

## 2024-02-27 DIAGNOSIS — C7951 Secondary malignant neoplasm of bone: Secondary | ICD-10-CM | POA: Diagnosis not present

## 2024-02-27 DIAGNOSIS — C649 Malignant neoplasm of unspecified kidney, except renal pelvis: Secondary | ICD-10-CM

## 2024-02-27 DIAGNOSIS — M545 Low back pain, unspecified: Secondary | ICD-10-CM | POA: Diagnosis not present

## 2024-02-27 DIAGNOSIS — M533 Sacrococcygeal disorders, not elsewhere classified: Secondary | ICD-10-CM

## 2024-02-27 NOTE — Telephone Encounter (Signed)
 Yes he ordered lumbar and sacral I called her we discussed she voiced understanding

## 2024-02-27 NOTE — Progress Notes (Signed)
 Chief Complaint  Patient presents with   Back Pain    Right lumbar pain     History this is a 63 year old female with metastatic renal carcinoma status post thoracic fusion for metastatic cancer to the bone who presents after a fall where she fractured some ribs.  She is having pain in her right buttock near the ischium without radiation.  Pain is increased at night  She denies any unexplained weight loss, malaise fatigue or fever  Examination show she has tenderness in the right buttock none in the midline of the spine none on the opposite side of the spine  She has no pain with flexion she has mild pain with some increase in pain when she goes into extension.  Hip range of motion was normal bilaterally straight leg raises were negative there were no neurovascular deficits  Based on the patient's history of renal cancer and metastasis recommend MRI with and without contrast to rule out metastatic lesion

## 2024-02-27 NOTE — Telephone Encounter (Signed)
 Dr. Areatha pt - pt lvm stating she was seen today and was trying to schedule her MRI, but they have two on file, two different body parts.  She would like a call back.  She stated that she didn't think he would order two.  [phone]

## 2024-02-27 NOTE — Progress Notes (Signed)
  Intake history:  Ht 5' 6 (1.676 m)   Wt 185 lb (83.9 kg)   LMP 10/27/2013   BMI 29.86 kg/m  Body mass index is 29.86 kg/m.    WHAT ARE WE SEEING YOU FOR TODAY?   Back right sided lower back   How long has this bothered you? (DOI?DOS?WS?)  3 weeks / has pain at night no injury   Was there an injury? No  Anticoag.  No  Diabetes No  Heart disease No  Hypertension No  SMOKING HX No  Kidney disease Yes / kidney cancer metastatic to spine / Dr Gillie has seen her for this surgery thoracic spine     Latest Ref Rng & Units 07/25/2023    9:02 AM 05/02/2023   10:30 AM 04/08/2023    1:06 PM  CMP  Glucose 70 - 99 mg/dL 899  99  92   BUN 8 - 23 mg/dL 21  23  22    Creatinine 0.44 - 1.00 mg/dL 8.92  8.83  8.82   Sodium 135 - 145 mmol/L 142  141  139   Potassium 3.5 - 5.1 mmol/L 3.9  4.2  4.4   Chloride 98 - 111 mmol/L 107  106  106   CO2 22 - 32 mmol/L 28  27  25    Calcium  8.9 - 10.3 mg/dL 9.7  9.5  9.2   Total Protein 6.5 - 8.1 g/dL 7.3  7.0  7.1   Total Bilirubin 0.0 - 1.2 mg/dL 0.7  0.7  0.7   Alkaline Phos 38 - 126 U/L 61  55  50   AST 15 - 41 U/L 15  14  15    ALT 0 - 44 U/L 5  10  10       Any ALLERGIES ______________________________________________

## 2024-02-27 NOTE — Patient Instructions (Signed)
 While we are working on your approval for MRI please go ahead and call to schedule your appointment with [facility] within at least one (1) week.   Central Scheduling [phone]

## 2024-02-28 ENCOUNTER — Encounter (HOSPITAL_COMMUNITY): Payer: PRIVATE HEALTH INSURANCE

## 2024-03-05 ENCOUNTER — Ambulatory Visit (HOSPITAL_COMMUNITY)
Admission: RE | Admit: 2024-03-05 | Discharge: 2024-03-05 | Discharge status: Home / Self Care | Payer: PRIVATE HEALTH INSURANCE | Source: Ambulatory Visit | Attending: Orthopedic Surgery | Admitting: Orthopedic Surgery

## 2024-03-05 ENCOUNTER — Ambulatory Visit (HOSPITAL_COMMUNITY)
Admission: RE | Admit: 2024-03-05 | Discharge: 2024-03-05 | Disposition: A | Discharge status: Home / Self Care | Payer: PRIVATE HEALTH INSURANCE | Source: Ambulatory Visit | Attending: Orthopedic Surgery | Admitting: Orthopedic Surgery

## 2024-03-05 DIAGNOSIS — C649 Malignant neoplasm of unspecified kidney, except renal pelvis: Secondary | ICD-10-CM | POA: Insufficient documentation

## 2024-03-05 DIAGNOSIS — M545 Low back pain, unspecified: Secondary | ICD-10-CM | POA: Diagnosis present

## 2024-03-05 DIAGNOSIS — M533 Sacrococcygeal disorders, not elsewhere classified: Secondary | ICD-10-CM

## 2024-03-05 DIAGNOSIS — C7951 Secondary malignant neoplasm of bone: Secondary | ICD-10-CM | POA: Insufficient documentation

## 2024-03-05 MED ORDER — GADOBUTROL 1 MMOL/ML IV SOLN
8.0000 mL | Freq: Once | INTRAVENOUS | Status: AC | PRN
  Administered 2024-03-05: 8 mL via INTRAVENOUS

## 2024-03-06 ENCOUNTER — Encounter (HOSPITAL_COMMUNITY): Payer: PRIVATE HEALTH INSURANCE

## 2024-03-13 ENCOUNTER — Encounter (HOSPITAL_COMMUNITY): Payer: PRIVATE HEALTH INSURANCE

## 2024-03-20 ENCOUNTER — Ambulatory Visit: Payer: PRIVATE HEALTH INSURANCE | Admitting: Orthopedic Surgery

## 2024-03-20 ENCOUNTER — Encounter: Payer: Self-pay | Admitting: Orthopedic Surgery

## 2024-03-20 ENCOUNTER — Encounter (HOSPITAL_COMMUNITY): Payer: PRIVATE HEALTH INSURANCE

## 2024-03-20 DIAGNOSIS — M533 Sacrococcygeal disorders, not elsewhere classified: Secondary | ICD-10-CM | POA: Diagnosis not present

## 2024-03-20 DIAGNOSIS — Z981 Arthrodesis status: Secondary | ICD-10-CM | POA: Diagnosis not present

## 2024-03-20 DIAGNOSIS — M545 Low back pain, unspecified: Secondary | ICD-10-CM | POA: Diagnosis not present

## 2024-03-20 NOTE — Patient Instructions (Signed)
 We are referring you to [facility] from [facility] address is [address] [place] The phone number is [phone]  The office will call you with an appointment Dr. Alvester Morin

## 2024-03-20 NOTE — Progress Notes (Signed)
 MRI RESULTS FOLLOW UP   Encounter Diagnoses  Name Primary?   Lumbar pain Yes   Sacral pain    History of thoracic spinal fusion with metastatic disease     Chief Complaint  Patient presents with   Results    Review MRI scans    HPI 63 year old female with thoracic spinal fusion related to metastatic disease with lower back pain sent for MRI  + EXAM FINDINGS: Tenderness in the right buttock and sciatic notch and lower back  MY READING OF THE MRI spinal stenosis  MRI REPORT:   IMPRESSION: 1. No evidence of metastatic renal cell carcinoma to bone. 2. 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 3. L3-4: Grade 1 degenerative anterolisthesis with moderate bilateral facet arthrosis, resulting in mild-to-moderate central spinal canal stenosis and bilateral lateral recess stenosis. No apparent nerve root impingement. 4. L4-5: Mild diffuse disc bulging and a central annular tear. No significant spinal canal or neural foraminal stenosis.   Electronically signed by: Evalene Coho MD 03/16/2024 04:42 AM EDT RP Workstation: [id number]    IMPRESSION: 1. No findings suspicious for metastatic disease. 2. 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   Electronically signed by: Evalene Coho MD 03/16/2024 04:50 AM EDT RP Workstation: [id number]    ASSESSMENT AND PLAN :   63 year old female with back pain ruled out from mets with MRI spine and sacrum.  Does have symptomatic spinal stenosis and/or sciatic nerve impingement recommend spinal evaluation and possible injection  Referral made

## 2024-03-20 NOTE — Progress Notes (Signed)
   LMP 10/27/2013   There is no height or weight on file to calculate BMI.  Chief Complaint  Patient presents with   Results    Review MRI scans    No diagnosis found.  DOI/DOS/ Date: ongoing  Unchanged

## 2024-04-07 ENCOUNTER — Encounter: Payer: Self-pay | Admitting: Physical Medicine and Rehabilitation

## 2024-04-07 ENCOUNTER — Ambulatory Visit: Payer: PRIVATE HEALTH INSURANCE | Admitting: Physical Medicine and Rehabilitation

## 2024-04-07 ENCOUNTER — Ambulatory Visit (INDEPENDENT_AMBULATORY_CARE_PROVIDER_SITE_OTHER): Payer: PRIVATE HEALTH INSURANCE | Admitting: Physical Medicine and Rehabilitation

## 2024-04-07 DIAGNOSIS — M7918 Myalgia, other site: Secondary | ICD-10-CM

## 2024-04-07 DIAGNOSIS — M5441 Lumbago with sciatica, right side: Secondary | ICD-10-CM | POA: Diagnosis not present

## 2024-04-07 DIAGNOSIS — M48061 Spinal stenosis, lumbar region without neurogenic claudication: Secondary | ICD-10-CM

## 2024-04-07 DIAGNOSIS — M25551 Pain in right hip: Secondary | ICD-10-CM | POA: Diagnosis not present

## 2024-04-07 DIAGNOSIS — G8929 Other chronic pain: Secondary | ICD-10-CM

## 2024-04-07 NOTE — Progress Notes (Unsigned)
 Pain Scale   Average Pain 5 Patient advising she has chronic lower to right hip pain that is constant.        +Driver, -BT, -Dye Allergies.

## 2024-04-07 NOTE — Progress Notes (Unsigned)
 [name] - 63 y.o. female MRN [id number]  Date of birth: [birth year]  Office Visit Note: Visit Date: 04/07/2024 PCP: Sheryle Carwin, MD Referred by: Sheryle Carwin, MD  Subjective: Chief Complaint  Patient presents with   Lower Back - Pain   HPI: Dana Hanson is a 63 y.o. female who comes in today per the request of Dr. Taft Minerva for evaluation of chronic, worsening and severe right sided lower back pain radiating to buttock and lateral hip. She has fairly significant history of metastatic renal cancer. She underwent thoracic fusion with Dr. Rockey Peru in March of 2024. Lower back pain ongoing for several months, her pain worsens with prolonged standing and walking. She describes her pain as throbbing sensation, currently rates as 5 out of 10. Some relief of pain with home exercise regimen, rest and use of medications. She did undergo bilateral T6-T7 transforaminal epidural steroid injection under CT guidance in February of this year. No history of lumbar injections. Recent lumbar MRI imaging shows grade 1 degenerative anterolisthesis at L3-L4 with moderate bilateral facet arthrosis, resulting in mild-to-moderate central spinal canal stenosis and bilateral lateral recess stenosis. There is central annular tear at L4-L5.  No high grade spinal canal stenosis noted. Patient denies focal weakness, numbness and tingling. No recent trauma or falls.       Review of Systems  Musculoskeletal:  Positive for back pain.  Neurological:  Negative for tingling, sensory change, focal weakness and weakness.  All other systems reviewed and are negative.  Otherwise per HPI.  Assessment & Plan: Visit Diagnoses:    ICD-10-CM   1. Chronic right-sided low back pain with right-sided sciatica  M54.41 Ambulatory referral to Physical Medicine Rehab   G89.29     2. Chronic buttock pain  M79.18 Ambulatory referral to Physical Medicine Rehab   G89.29     3. Pain in right hip  M25.551 Ambulatory  referral to Physical Medicine Rehab    4. Stenosis of lateral recess of lumbar spine  M48.061 Ambulatory referral to Physical Medicine Rehab       Plan: Findings:  Chronic, worsening and severe right-sided lower back pain radiating to buttock and lateral right hip.  Patient continues to have severe pain despite good conservative therapy such as home exercise regimen, rest and use of medications.  Patient's clinical presentation and exam do seem to fit with lumbar radiculopathy.  I discussed recent lumbar MRI with her today using imaging and spine model. There is grade 1 degenerative anterolisthesis at L3-L4 where there is central canal stenosis and lateral recess stenosis. We discussed treatment plan in detail today. Next step is to perform diagnostic and hopefully therapeutic right L4-L5 interlaminar epidural steroid injection under fluoroscopic guidance. She is not taking anticoagulant medications. I discussed injection procedure with patient today, she has no questions at this time. No red flag symptoms noted upon exam today.     Meds & Orders: No orders of the defined types were placed in this encounter.   Orders Placed This Encounter  Procedures   Ambulatory referral to Physical Medicine Rehab    Follow-up: Return for Right L4-L5 interlaminar epidural steroid injection.   Procedures: No procedures performed      Clinical History: EXAM: MRI LUMBAR SPINE 03/05/2024 11:25:51 AM   TECHNIQUE: Multiplanar multisequence MRI of the lumbar spine was performed with and without the administration of intravenous contrast. 8mL gadobutrol  (GADAVIST ) 1 MMOL/ML injection was used.   COMPARISON: Lumbar spine series dated 01/20/2024.  CLINICAL HISTORY: Bone mass or bone pain, lumbar spine, aggressive features on xray. Low back pain; Metastatic renal cell carcinoma to bone. Status post right nephrectomy.   FINDINGS:   BONES AND ALIGNMENT: Mild rotatory dextroscoliosis of the lumbar spine.  Normal vertebral body heights. Bone marrow signal is unremarkable.   SPINAL CORD: The conus medullaris terminates at T12.   SOFT TISSUES: No paraspinal mass. No abnormal contrast enhancement.   L1-L2: No significant disc herniation. No spinal canal stenosis or neural foraminal narrowing.   L2-L3: No significant disc herniation. No spinal canal stenosis or neural foraminal narrowing.   L3-L4: Grade 1 degenerative anterolisthesis with moderate bilateral facet arthrosis, resulting in mild-to-moderate central spinal canal stenosis and bilateral lateral recess stenosis. No apparent nerve root impingement.   L4-L5: Mild diffuse disc bulging and a central annular tear. No significant spinal canal or neural foraminal stenosis.   L5-S1: No significant disc herniation. No spinal canal stenosis or neural foraminal narrowing.   IMPRESSION: 1. No evidence of metastatic renal cell carcinoma to bone. 2. 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 3. L3-4: Grade 1 degenerative anterolisthesis with moderate bilateral facet arthrosis, resulting in mild-to-moderate central spinal canal stenosis and bilateral lateral recess stenosis. No apparent nerve root impingement. 4. L4-5: Mild diffuse disc bulging and a central annular tear. No significant spinal canal or neural foraminal stenosis.   Electronically signed by: Evalene Coho MD 03/16/2024 04:42 AM EDT RP Workstation: [id number]   She reports that she has never smoked. She has never used smokeless tobacco. No results for input(s): HGBA1C, LABURIC in the last 8760 hours.  Objective:  VS:  HT:    WT:   BMI:     BP:   HR: bpm  TEMP: ( )  RESP:  Physical Exam Vitals and nursing note reviewed.  HENT:     Head: Normocephalic and atraumatic.     Right Ear: External ear normal.     Left Ear: External ear normal.     Nose: Nose normal.     Mouth/Throat:     Mouth: Mucous membranes are moist.  Eyes:     Extraocular  Movements: Extraocular movements intact.  Cardiovascular:     Rate and Rhythm: Normal rate.     Pulses: Normal pulses.  Pulmonary:     Effort: Pulmonary effort is normal.  Abdominal:     General: Abdomen is flat. There is no distension.  Musculoskeletal:        General: Tenderness present.     Cervical back: Normal range of motion.     Comments: Patient rises from seated position to standing without difficulty. Good lumbar range of motion. No pain noted with facet loading. 5/5 strength noted with bilateral hip flexion, knee flexion/extension, ankle dorsiflexion/plantarflexion and EHL. No clonus noted bilaterally. No pain upon palpation of greater trochanters. No pain with internal/external rotation of bilateral hips. Sensation intact bilaterally. Negative slump test bilaterally. Ambulates without aid, gait steady.     Skin:    General: Skin is warm and dry.     Capillary Refill: Capillary refill takes less than 2 seconds.  Neurological:     General: No focal deficit present.     Mental Status: She is alert and oriented to person, place, and time.  Psychiatric:        Mood and Affect: Mood normal.        Behavior: Behavior normal.     Ortho Exam  Imaging: No results found.  Past Medical/Family/Surgical/Social History: Medications &  Allergies reviewed per EMR, new medications updated. Patient Active Problem List   Diagnosis Date Noted   Pathologic thoracic fracture, sequela 10/11/2022   Encounter for antineoplastic immunotherapy 08/24/2022   Closed fracture of proximal end of left humerus with routine healing 01/25/2022   History of colonic polyps 10/12/2021   Abnormal PET scan of colon 10/12/2021   Metastatic renal cell carcinoma to bone (HCC) 09/27/2021   Cancer of right kidney (HCC) 09/03/2015   Renal mass 09/03/2015   Pancreatitis 08/21/2015   Biliary obstruction    Primary osteoarthritis of right knee 03/17/2014   Hemorrhoids 08/01/2011   Heme positive stool 08/01/2011    Rectal bleed 08/01/2011   Past Medical History:  Diagnosis Date   Arthritis    right knee   Cancer (HCC)    kidney   Gallstone pancreatitis    Hyperlipidemia    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    IBS (irritable bowel syndrome)    patient denies   Right renal mass    Family History  Adopted: Yes  Problem Relation Age of Onset   Colon cancer Neg Hx    Liver disease Neg Hx    Anesthesia problems Neg Hx    Hypotension Neg Hx    Malignant hyperthermia Neg Hx    Pseudochol deficiency Neg Hx    Past Surgical History:  Procedure Laterality Date   CESAREAN SECTION     x 2-tubal with last c-section   CHOLECYSTECTOMY N/A 09/03/2015   Procedure: LAPAROSCOPIC CHOLECYSTECTOMY WITH INTRAOPERATIVE CHOLANGIOGRAM;  Surgeon: Herlene Righter Kinsinger, MD;  Location: [facility];  Service: General;  Laterality: N/A;   COLONOSCOPY  08/14/2011   Surgeon: Lamar CHRISTELLA Hollingshead, MD; external hemorrhoid tags, likely source of hematochezia, normal rectum, multiple colonic polyps at the base of the cecum, all less than 5 mm, single 4 mm sigmoid polyp, scattered diverticula.  Pathology revealed tubular adenoma in the cecum, hyperplastic polyp in the sigmoid colon.  Recommended repeat colonoscopy in 3 years.   COLONOSCOPY WITH PROPOFOL  N/A 11/30/2021   Procedure: COLONOSCOPY WITH PROPOFOL ;  Surgeon: Hollingshead Lamar CHRISTELLA, MD;  Location: [facility];  Service: Endoscopy;  Laterality: N/A;  1:45pm   HEMORRHOID SURGERY  11/10/2011   Procedure: HEMORRHOIDECTOMY;  Surgeon: Thresa JAYSON Pulling, MD;  Location: [facility];  Service: General;  Laterality: N/A;   IR RADIOLOGIST EVAL & MGMT  04/17/2023   LAPAROSCOPIC GASTRIC SLEEVE RESECTION  01/15/2015   LAPAROSCOPIC NEPHRECTOMY N/A 09/03/2015   Procedure: LAPAROSCOPIC RADICAL NEPHRECTOMY;  Surgeon: Morene LELON Salines, MD;  Location: [facility];  Service: Urology;  Laterality: N/A;   LUMBAR PERCUTANEOUS PEDICLE SCREW 4 LEVEL N/A 10/11/2022   Procedure: OPEN REDUCTION INTERNAL FIXATION OF  THORACIC SIX PATHOLOGIC FRACTURE;  Surgeon: Gillie Duncans, MD;  Location: [facility];  Service: Neurosurgery;  Laterality: N/A;   POLYPECTOMY  11/30/2021   Procedure: POLYPECTOMY;  Surgeon: Hollingshead Lamar CHRISTELLA, MD;  Location: [facility];  Service: Endoscopy;;   TUBAL LIGATION     with last c-section   VERTEBROPLASTY N/A 09/09/2021   Procedure: Thoracic vertebral biopsy;  Surgeon: Gillie Duncans, MD;  Location: [facility] OR;  Service: Neurosurgery;  Laterality: N/A;  RM 21   Social History   Occupational History    Employer: [id number]  Tobacco Use   Smoking status: Never   Smokeless tobacco: Never  Vaping Use   Vaping status: Never Used  Substance and Sexual Activity   Alcohol use: No   Drug use: No   Sexual activity:  Yes    Birth control/protection: Surgical

## 2024-05-19 ENCOUNTER — Encounter: Payer: Self-pay | Admitting: Radiology

## 2024-05-26 ENCOUNTER — Other Ambulatory Visit: Payer: Self-pay

## 2024-05-26 ENCOUNTER — Ambulatory Visit: Payer: PRIVATE HEALTH INSURANCE | Admitting: Physical Medicine and Rehabilitation

## 2024-05-26 VITALS — BP 110/71 | HR 92

## 2024-05-26 DIAGNOSIS — M5416 Radiculopathy, lumbar region: Secondary | ICD-10-CM | POA: Diagnosis not present

## 2024-05-26 MED ORDER — METHYLPREDNISOLONE ACETATE 40 MG/ML IJ SUSP
40.0000 mg | Freq: Once | INTRAMUSCULAR | Status: AC
  Administered 2024-05-26: 40 mg

## 2024-05-26 NOTE — Progress Notes (Signed)
 Dana Hanson - 63 y.o. female MRN [id number]  Date of birth: 01-Jun-1961  Office Visit Note: Visit Date: 05/26/2024 PCP: Sheryle Carwin, MD Referred by: Sheryle Carwin, MD  Subjective: Chief Complaint  Patient presents with   Lower Back - Pain   HPI:  Dana Hanson is a 63 y.o. female who comes in today at the request of Duwaine Pouch, FNP for planned Right L4-5 Lumbar Interlaminar epidural steroid injection with fluoroscopic guidance.  The patient has failed conservative care including home exercise, medications, time and activity modification.  This injection will be diagnostic and hopefully therapeutic.  Please see requesting physician notes for further details and justification.   ROS Otherwise per HPI.  Assessment & Plan: Visit Diagnoses:    ICD-10-CM   1. Lumbar radiculopathy  M54.16 XR C-ARM NO REPORT    Epidural Steroid injection    methylPREDNISolone  acetate (DEPO-MEDROL ) injection 40 mg      Plan: No additional findings.   Meds & Orders:  Meds ordered this encounter  Medications   methylPREDNISolone  acetate (DEPO-MEDROL ) injection 40 mg    Orders Placed This Encounter  Procedures   XR C-ARM NO REPORT   Epidural Steroid injection    Follow-up: Return for visit to requesting provider as needed.   Procedures: No procedures performed  Lumbar Epidural Steroid Injection - Interlaminar Approach with Fluoroscopic Guidance  Patient: Dana Hanson      Date of Birth: 18-Jun-1961 MRN: [id number] PCP: Sheryle Carwin, MD      Visit Date: 05/26/2024   Universal Protocol:     Consent Given By: the patient  Position: PRONE  Additional Comments: Vital signs were monitored before and after the procedure. Patient was prepped and draped in the usual sterile fashion. The correct patient, procedure, and site was verified.   Injection Procedure Details:   Procedure diagnoses: Lumbar radiculopathy [M54.16]   Meds Administered:  Meds ordered this encounter   Medications   methylPREDNISolone  acetate (DEPO-MEDROL ) injection 40 mg     Laterality: Right  Location/Site:  L4-5  Needle: 3.5 in., 20 ga. Tuohy  Needle Placement: Paramedian epidural  Findings:   -Comments: Excellent flow of contrast into the epidural space.  Procedure Details: Using a paramedian approach from the side mentioned above, the region overlying the inferior lamina was localized under fluoroscopic visualization and the soft tissues overlying this structure were infiltrated with 4 ml. of 1% Lidocaine  without Epinephrine . The Tuohy needle was inserted into the epidural space using a paramedian approach.   The epidural space was localized using loss of resistance along with counter oblique bi-planar fluoroscopic views.  After negative aspirate for air, blood, and CSF, a 2 ml. volume of Isovue-250 was injected into the epidural space and the flow of contrast was observed. Radiographs were obtained for documentation purposes.    The injectate was administered into the level noted above.   Additional Comments:  The patient tolerated the procedure well Dressing: 2 x 2 sterile gauze and Band-Aid    Post-procedure details: Patient was observed during the procedure. Post-procedure instructions were reviewed.  Patient left the clinic in stable condition.   Clinical History: EXAM: MRI LUMBAR SPINE 03/05/2024 11:25:51 AM   TECHNIQUE: Multiplanar multisequence MRI of the lumbar spine was performed with and without the administration of intravenous contrast. 8mL gadobutrol  (GADAVIST ) 1 MMOL/ML injection was used.   COMPARISON: Lumbar spine series dated 01/20/2024.   CLINICAL HISTORY: Bone mass or bone pain, lumbar spine, aggressive features on xray. Low back pain;  Metastatic renal cell carcinoma to bone. Status post right nephrectomy.   FINDINGS:   BONES AND ALIGNMENT: Mild rotatory dextroscoliosis of the lumbar spine. Normal vertebral body heights. Bone marrow  signal is unremarkable.   SPINAL CORD: The conus medullaris terminates at T12.   SOFT TISSUES: No paraspinal mass. No abnormal contrast enhancement.   L1-L2: No significant disc herniation. No spinal canal stenosis or neural foraminal narrowing.   L2-L3: No significant disc herniation. No spinal canal stenosis or neural foraminal narrowing.   L3-L4: Grade 1 degenerative anterolisthesis with moderate bilateral facet arthrosis, resulting in mild-to-moderate central spinal canal stenosis and bilateral lateral recess stenosis. No apparent nerve root impingement.   L4-L5: Mild diffuse disc bulging and a central annular tear. No significant spinal canal or neural foraminal stenosis.   L5-S1: No significant disc herniation. No spinal canal stenosis or neural foraminal narrowing.   IMPRESSION: 1. No evidence of metastatic renal cell carcinoma to bone. 2. 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 3. L3-4: Grade 1 degenerative anterolisthesis with moderate bilateral facet arthrosis, resulting in mild-to-moderate central spinal canal stenosis and bilateral lateral recess stenosis. No apparent nerve root impingement. 4. L4-5: Mild diffuse disc bulging and a central annular tear. No significant spinal canal or neural foraminal stenosis.   Electronically signed by: Evalene Coho MD 03/16/2024 04:42 AM EDT RP Workstation: [id number]     Objective:  VS:  HT:    WT:   BMI:     BP:110/71  HR:92bpm  TEMP: ( )  RESP:  Physical Exam Vitals and nursing note reviewed.  Constitutional:      General: She is not in acute distress.    Appearance: Normal appearance. She is not ill-appearing.  HENT:     Head: Normocephalic and atraumatic.     Right Ear: External ear normal.     Left Ear: External ear normal.  Eyes:     Extraocular Movements: Extraocular movements intact.  Cardiovascular:     Rate and Rhythm: Normal rate.     Pulses: Normal pulses.  Pulmonary:      Effort: Pulmonary effort is normal. No respiratory distress.  Abdominal:     General: There is no distension.     Palpations: Abdomen is soft.  Musculoskeletal:        General: Tenderness present.     Cervical back: Neck supple.     Right lower leg: No edema.     Left lower leg: No edema.     Comments: Patient has good distal strength with no pain over the greater trochanters.  No clonus or focal weakness.  Skin:    Findings: No erythema, lesion or rash.  Neurological:     General: No focal deficit present.     Mental Status: She is alert and oriented to person, place, and time.     Sensory: No sensory deficit.     Motor: No weakness or abnormal muscle tone.     Coordination: Coordination normal.  Psychiatric:        Mood and Affect: Mood normal.        Behavior: Behavior normal.      Imaging: No results found.

## 2024-05-26 NOTE — Procedures (Signed)
 Lumbar Epidural Steroid Injection - Interlaminar Approach with Fluoroscopic Guidance  Patient: Dana Hanson      Date of Birth: [birth year] MRN: [id number] PCP: Sheryle Carwin, MD      Visit Date: 05/26/2024   Universal Protocol:     Consent Given By: the patient  Position: PRONE  Additional Comments: Vital signs were monitored before and after the procedure. Patient was prepped and draped in the usual sterile fashion. The correct patient, procedure, and site was verified.   Injection Procedure Details:   Procedure diagnoses: Lumbar radiculopathy [M54.16]   Meds Administered:  Meds ordered this encounter  Medications   methylPREDNISolone  acetate (DEPO-MEDROL ) injection 40 mg     Laterality: Right  Location/Site:  L4-5  Needle: 3.5 in., 20 ga. Tuohy  Needle Placement: Paramedian epidural  Findings:   -Comments: Excellent flow of contrast into the epidural space.  Procedure Details: Using a paramedian approach from the side mentioned above, the region overlying the inferior lamina was localized under fluoroscopic visualization and the soft tissues overlying this structure were infiltrated with 4 ml. of 1% Lidocaine  without Epinephrine . The Tuohy needle was inserted into the epidural space using a paramedian approach.   The epidural space was localized using loss of resistance along with counter oblique bi-planar fluoroscopic views.  After negative aspirate for air, blood, and CSF, a 2 ml. volume of Isovue-250 was injected into the epidural space and the flow of contrast was observed. Radiographs were obtained for documentation purposes.    The injectate was administered into the level noted above.   Additional Comments:  The patient tolerated the procedure well Dressing: 2 x 2 sterile gauze and Band-Aid    Post-procedure details: Patient was observed during the procedure. Post-procedure instructions were reviewed.  Patient left the clinic in stable condition.

## 2024-05-26 NOTE — Progress Notes (Signed)
 Pain Scale   Average Pain 5 Patient advising she has lower back pain radiating to right hip pain increases at night.        +Driver, -BT, -Dye Allergies.

## 2024-07-21 ENCOUNTER — Other Ambulatory Visit: Payer: Self-pay | Admitting: Physical Medicine and Rehabilitation

## 2024-07-21 ENCOUNTER — Telehealth: Payer: Self-pay | Admitting: Physical Medicine and Rehabilitation

## 2024-07-21 DIAGNOSIS — M5416 Radiculopathy, lumbar region: Secondary | ICD-10-CM

## 2024-07-21 NOTE — Telephone Encounter (Signed)
 Patient called. Would like an appointment with Dr. Alvester Morin

## 2024-08-07 ENCOUNTER — Ambulatory Visit: Payer: PRIVATE HEALTH INSURANCE | Admitting: Physical Medicine and Rehabilitation

## 2024-08-07 ENCOUNTER — Other Ambulatory Visit: Payer: Self-pay

## 2024-08-07 VITALS — BP 101/69 | HR 94

## 2024-08-07 DIAGNOSIS — M5416 Radiculopathy, lumbar region: Secondary | ICD-10-CM

## 2024-08-07 MED ORDER — METHYLPREDNISOLONE ACETATE 40 MG/ML IJ SUSP
40.0000 mg | Freq: Once | INTRAMUSCULAR | Status: AC
  Administered 2024-08-07: 40 mg

## 2024-08-07 NOTE — Progress Notes (Signed)
 Pain Scale   Average Pain 5 Patient advising she has lower back pain that is constant when standing and decreases when sitting.        +Driver, -BT, -Dye Allergies.

## 2024-08-12 NOTE — Procedures (Signed)
 Lumbar Epidural Steroid Injection - Interlaminar Approach with Fluoroscopic Guidance  Patient: Dana Hanson      Date of Birth: 13-May-1961 MRN: [id number] PCP: Sheryle Carwin, MD      Visit Date: 08/07/2024   Universal Protocol:     Consent Given By: the patient  Position: PRONE  Additional Comments: Vital signs were monitored before and after the procedure. Patient was prepped and draped in the usual sterile fashion. The correct patient, procedure, and site was verified.   Injection Procedure Details:   Procedure diagnoses: Lumbar radiculopathy [M54.16]   Meds Administered:  Meds ordered this encounter  Medications   methylPREDNISolone  acetate (DEPO-MEDROL ) injection 40 mg     Laterality: Right  Location/Site:  L4-5  Needle: 3.5 in., 20 ga. Tuohy  Needle Placement: Paramedian epidural  Findings:   -Comments: Excellent flow of contrast into the epidural space.  Procedure Details: Using a paramedian approach from the side mentioned above, the region overlying the inferior lamina was localized under fluoroscopic visualization and the soft tissues overlying this structure were infiltrated with 4 ml. of 1% Lidocaine  without Epinephrine . The Tuohy needle was inserted into the epidural space using a paramedian approach.   The epidural space was localized using loss of resistance along with counter oblique bi-planar fluoroscopic views.  After negative aspirate for air, blood, and CSF, a 2 ml. volume of Isovue-250 was injected into the epidural space and the flow of contrast was observed. Radiographs were obtained for documentation purposes.    The injectate was administered into the level noted above.   Additional Comments:  The patient tolerated the procedure well Dressing: 2 x 2 sterile gauze and Band-Aid    Post-procedure details: Patient was observed during the procedure. Post-procedure instructions were reviewed.  Patient left the clinic in stable condition.

## 2024-08-12 NOTE — Progress Notes (Signed)
 "  CATELYNN SPARGER - 64 y.o. female MRN [id number]  Date of birth: 1960-10-30  Office Visit Note: Visit Date: 08/07/2024 PCP: Sheryle Carwin, MD Referred by: Sheryle Carwin, MD  Subjective: Chief Complaint  Patient presents with   Lower Back - Pain   HPI:  Dana Hanson is a 64 y.o. female who comes in today at the request of Duwaine Pouch, FNP for planned Right L4-5 Lumbar Interlaminar epidural steroid injection with fluoroscopic guidance.  The patient has failed conservative care including home exercise, medications, time and activity modification.  This injection will be diagnostic and hopefully therapeutic.  Please see requesting physician notes for further details and justification.   ROS Otherwise per HPI.  Assessment & Plan: Visit Diagnoses:    ICD-10-CM   1. Lumbar radiculopathy  M54.16 XR C-ARM NO REPORT    Epidural Steroid injection    methylPREDNISolone  acetate (DEPO-MEDROL ) injection 40 mg      Plan: No additional findings.   Meds & Orders:  Meds ordered this encounter  Medications   methylPREDNISolone  acetate (DEPO-MEDROL ) injection 40 mg    Orders Placed This Encounter  Procedures   XR C-ARM NO REPORT   Epidural Steroid injection    Follow-up: Return for visit to requesting provider as needed.   Procedures: No procedures performed  Lumbar Epidural Steroid Injection - Interlaminar Approach with Fluoroscopic Guidance  Patient: Dana Hanson      Date of Birth: September 09, 1960 MRN: [id number] PCP: Sheryle Carwin, MD      Visit Date: 08/07/2024   Universal Protocol:     Consent Given By: the patient  Position: PRONE  Additional Comments: Vital signs were monitored before and after the procedure. Patient was prepped and draped in the usual sterile fashion. The correct patient, procedure, and site was verified.   Injection Procedure Details:   Procedure diagnoses: Lumbar radiculopathy [M54.16]   Meds Administered:  Meds ordered this encounter   Medications   methylPREDNISolone  acetate (DEPO-MEDROL ) injection 40 mg     Laterality: Right  Location/Site:  L4-5  Needle: 3.5 in., 20 ga. Tuohy  Needle Placement: Paramedian epidural  Findings:   -Comments: Excellent flow of contrast into the epidural space.  Procedure Details: Using a paramedian approach from the side mentioned above, the region overlying the inferior lamina was localized under fluoroscopic visualization and the soft tissues overlying this structure were infiltrated with 4 ml. of 1% Lidocaine  without Epinephrine . The Tuohy needle was inserted into the epidural space using a paramedian approach.   The epidural space was localized using loss of resistance along with counter oblique bi-planar fluoroscopic views.  After negative aspirate for air, blood, and CSF, a 2 ml. volume of Isovue-250 was injected into the epidural space and the flow of contrast was observed. Radiographs were obtained for documentation purposes.    The injectate was administered into the level noted above.   Additional Comments:  The patient tolerated the procedure well Dressing: 2 x 2 sterile gauze and Band-Aid    Post-procedure details: Patient was observed during the procedure. Post-procedure instructions were reviewed.  Patient left the clinic in stable condition.   Clinical History: EXAM: MRI LUMBAR SPINE 03/05/2024 11:25:51 AM   TECHNIQUE: Multiplanar multisequence MRI of the lumbar spine was performed with and without the administration of intravenous contrast. 8mL gadobutrol  (GADAVIST ) 1 MMOL/ML injection was used.   COMPARISON: Lumbar spine series dated 01/20/2024.   CLINICAL HISTORY: Bone mass or bone pain, lumbar spine, aggressive features on xray. 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 Status post right nephrectomy.   FINDINGS:   BONES AND ALIGNMENT: Mild rotatory dextroscoliosis of the lumbar spine. Normal vertebral body heights. Bone marrow  signal is unremarkable.   SPINAL CORD: The conus medullaris terminates at T12.   SOFT TISSUES: No paraspinal mass. No abnormal contrast enhancement.   L1-L2: No significant disc herniation. No spinal canal stenosis or neural foraminal narrowing.   L2-L3: No significant disc herniation. No spinal canal stenosis or neural foraminal narrowing.   L3-L4: Grade 1 degenerative anterolisthesis with moderate bilateral facet arthrosis, resulting in mild-to-moderate central spinal canal stenosis and bilateral lateral recess stenosis. No apparent nerve root impingement.   L4-L5: Mild diffuse disc bulging and a central annular tear. No significant spinal canal or neural foraminal stenosis.   L5-S1: No significant disc herniation. No spinal canal stenosis or neural foraminal narrowing.   IMPRESSION: 1. No evidence of metastatic renal cell carcinoma to bone. 2. 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 3. L3-4: Grade 1 degenerative anterolisthesis with moderate bilateral facet arthrosis, resulting in mild-to-moderate central spinal canal stenosis and bilateral lateral recess stenosis. No apparent nerve root impingement. 4. L4-5: Mild diffuse disc bulging and a central annular tear. No significant spinal canal or neural foraminal stenosis.   Electronically signed by: Evalene Coho MD 03/16/2024 04:42 AM EDT RP Workstation: [id number]     Objective:  VS:  HT:    WT:   BMI:     BP:101/69  HR:94bpm  TEMP: ( )  RESP:  Physical Exam Vitals and nursing note reviewed.  Constitutional:      General: She is not in acute distress.    Appearance: Normal appearance. She is not ill-appearing.  HENT:     Head: Normocephalic and atraumatic.     Right Ear: External ear normal.     Left Ear: External ear normal.  Eyes:     Extraocular Movements: Extraocular movements intact.  Cardiovascular:     Rate and Rhythm: Normal rate.     Pulses: Normal pulses.  Pulmonary:      Effort: Pulmonary effort is normal. No respiratory distress.  Abdominal:     General: There is no distension.     Palpations: Abdomen is soft.  Musculoskeletal:        General: Tenderness present.     Cervical back: Neck supple.     Right lower leg: No edema.     Left lower leg: No edema.     Comments: Patient has good distal strength with no pain over the greater trochanters.  No clonus or focal weakness.  Skin:    Findings: No erythema, lesion or rash.  Neurological:     General: No focal deficit present.     Mental Status: She is alert and oriented to person, place, and time.     Sensory: No sensory deficit.     Motor: No weakness or abnormal muscle tone.     Coordination: Coordination normal.  Psychiatric:        Mood and Affect: Mood normal.        Behavior: Behavior normal.      Imaging: No results found. "
